# Patient Record
Sex: Male | Born: 1935
Health system: Southern US, Community
[De-identification: ages and names within clinical notes are randomized; demographics above are authoritative.]

## PROBLEM LIST (undated history)

## (undated) DIAGNOSIS — C9110 Chronic lymphocytic leukemia of B-cell type not having achieved remission: Secondary | ICD-10-CM

## (undated) DIAGNOSIS — M1990 Unspecified osteoarthritis, unspecified site: Secondary | ICD-10-CM

## (undated) DIAGNOSIS — B029 Zoster without complications: Secondary | ICD-10-CM

## (undated) DIAGNOSIS — I2510 Atherosclerotic heart disease of native coronary artery without angina pectoris: Secondary | ICD-10-CM

## (undated) DIAGNOSIS — E559 Vitamin D deficiency, unspecified: Secondary | ICD-10-CM

## (undated) DIAGNOSIS — E785 Hyperlipidemia, unspecified: Secondary | ICD-10-CM

## (undated) HISTORY — DX: Unspecified osteoarthritis, unspecified site: M19.90

## (undated) HISTORY — PX: EYE SURGERY: SHX253

## (undated) HISTORY — PX: SURGERY OF LIP: SUR1315

## (undated) HISTORY — DX: Zoster without complications: B02.9

## (undated) HISTORY — DX: Vitamin D deficiency, unspecified: E55.9

## (undated) HISTORY — PX: LAPAROSCOPIC TRANS ANAL AND TRANSABDOMINAL RECTAL RESECTION WITH COLOANAL ANASTOMOSIS: SHX6245

## (undated) HISTORY — PX: VASECTOMY: SHX75

## (undated) HISTORY — DX: Chronic lymphocytic leukemia of B-cell type not having achieved remission: C91.10

## (undated) HISTORY — DX: Hyperlipidemia, unspecified: E78.5

## (undated) HISTORY — PX: TONSILLECTOMY: SUR1361

## (undated) HISTORY — PX: CARDIAC CATHETERIZATION: SHX172

---

## 1998-07-05 ENCOUNTER — Other Ambulatory Visit: Admission: RE | Admit: 1998-07-05 | Discharge: 1998-07-05 | Payer: Self-pay | Admitting: Gastroenterology

## 1999-01-03 ENCOUNTER — Ambulatory Visit (HOSPITAL_COMMUNITY): Admission: RE | Admit: 1999-01-03 | Discharge: 1999-01-03 | Payer: Self-pay | Admitting: General Surgery

## 1999-01-03 ENCOUNTER — Encounter (INDEPENDENT_AMBULATORY_CARE_PROVIDER_SITE_OTHER): Payer: Self-pay | Admitting: *Deleted

## 1999-06-14 ENCOUNTER — Ambulatory Visit (HOSPITAL_COMMUNITY): Admission: RE | Admit: 1999-06-14 | Discharge: 1999-06-14 | Payer: Self-pay

## 2003-12-17 ENCOUNTER — Ambulatory Visit: Payer: Self-pay | Admitting: Oncology

## 2004-03-22 ENCOUNTER — Ambulatory Visit: Payer: Self-pay | Admitting: Oncology

## 2004-06-21 ENCOUNTER — Ambulatory Visit: Payer: Self-pay | Admitting: Oncology

## 2004-10-01 ENCOUNTER — Ambulatory Visit: Payer: Self-pay | Admitting: Oncology

## 2004-12-20 ENCOUNTER — Ambulatory Visit: Payer: Self-pay | Admitting: Oncology

## 2005-03-25 ENCOUNTER — Ambulatory Visit: Payer: Self-pay | Admitting: Oncology

## 2005-07-08 ENCOUNTER — Ambulatory Visit: Payer: Self-pay | Admitting: Oncology

## 2005-07-14 LAB — CBC WITH DIFFERENTIAL/PLATELET
Basophils Absolute: 0 10*3/uL (ref 0.0–0.1)
EOS%: 2.5 % (ref 0.0–7.0)
Eosinophils Absolute: 0.3 10*3/uL (ref 0.0–0.5)
HGB: 15.9 g/dL (ref 13.0–17.1)
NEUT#: 3.6 10*3/uL (ref 1.5–6.5)
RDW: 13.3 % (ref 11.2–14.6)
lymph#: 7.3 10*3/uL — ABNORMAL HIGH (ref 0.9–3.3)

## 2005-07-17 LAB — IMMUNOFIXATION ELECTROPHORESIS
IgG (Immunoglobin G), Serum: 912 mg/dL (ref 694–1618)
IgM, Serum: 22 mg/dL — ABNORMAL LOW (ref 60–263)
Total Protein, Serum Electrophoresis: 5.8 g/dL — ABNORMAL LOW (ref 6.0–8.3)

## 2005-07-17 LAB — COMPREHENSIVE METABOLIC PANEL
AST: 17 U/L (ref 0–37)
Albumin: 3.5 g/dL (ref 3.5–5.2)
BUN: 17 mg/dL (ref 6–23)
Calcium: 8.6 mg/dL (ref 8.4–10.5)
Chloride: 106 mEq/L (ref 96–112)
Glucose, Bld: 110 mg/dL — ABNORMAL HIGH (ref 70–99)
Potassium: 4.4 mEq/L (ref 3.5–5.3)

## 2005-10-09 ENCOUNTER — Ambulatory Visit: Payer: Self-pay | Admitting: Oncology

## 2005-10-13 LAB — CBC WITH DIFFERENTIAL/PLATELET
Basophils Absolute: 0 10*3/uL (ref 0.0–0.1)
EOS%: 1.9 % (ref 0.0–7.0)
Eosinophils Absolute: 0.2 10*3/uL (ref 0.0–0.5)
HGB: 15.3 g/dL (ref 13.0–17.1)
MCH: 30.8 pg (ref 28.0–33.4)
NEUT#: 2.6 10*3/uL (ref 1.5–6.5)
RDW: 14.1 % (ref 11.2–14.6)
lymph#: 7.6 10*3/uL — ABNORMAL HIGH (ref 0.9–3.3)

## 2005-10-14 LAB — COMPREHENSIVE METABOLIC PANEL
AST: 18 U/L (ref 0–37)
Albumin: 3.7 g/dL (ref 3.5–5.2)
BUN: 20 mg/dL (ref 6–23)
Calcium: 8.6 mg/dL (ref 8.4–10.5)
Chloride: 108 mEq/L (ref 96–112)
Potassium: 4.2 mEq/L (ref 3.5–5.3)
Sodium: 139 mEq/L (ref 135–145)
Total Protein: 6 g/dL (ref 6.0–8.3)

## 2005-10-14 LAB — IMMUNOFIXATION ELECTROPHORESIS: IgM, Serum: 19 mg/dL — ABNORMAL LOW (ref 60–263)

## 2006-01-19 ENCOUNTER — Ambulatory Visit: Payer: Self-pay | Admitting: Internal Medicine

## 2006-01-22 LAB — CBC WITH DIFFERENTIAL/PLATELET
BASO%: 0.3 % (ref 0.0–2.0)
Basophils Absolute: 0 10*3/uL (ref 0.0–0.1)
EOS%: 1.2 % (ref 0.0–7.0)
HCT: 47.4 % (ref 38.7–49.9)
HGB: 16.2 g/dL (ref 13.0–17.1)
MCH: 30.8 pg (ref 28.0–33.4)
MCHC: 34.2 g/dL (ref 32.0–35.9)
MCV: 89.9 fL (ref 81.6–98.0)
MONO%: 4.1 % (ref 0.0–13.0)
NEUT%: 25.8 % — ABNORMAL LOW (ref 40.0–75.0)
lymph#: 10.2 10*3/uL — ABNORMAL HIGH (ref 0.9–3.3)

## 2006-01-26 LAB — COMPREHENSIVE METABOLIC PANEL
ALT: 20 U/L (ref 0–53)
AST: 19 U/L (ref 0–37)
Alkaline Phosphatase: 73 U/L (ref 39–117)
BUN: 15 mg/dL (ref 6–23)
Creatinine, Ser: 1.09 mg/dL (ref 0.40–1.50)
Total Bilirubin: 0.5 mg/dL (ref 0.3–1.2)

## 2006-01-26 LAB — IMMUNOFIXATION ELECTROPHORESIS
IgA: 167 mg/dL (ref 68–378)
IgM, Serum: 25 mg/dL — ABNORMAL LOW (ref 60–263)

## 2006-01-26 LAB — BETA 2 MICROGLOBULIN, SERUM: Beta-2 Microglobulin: 2.31 mg/L — ABNORMAL HIGH (ref 1.01–1.73)

## 2006-04-15 ENCOUNTER — Ambulatory Visit: Payer: Self-pay | Admitting: Oncology

## 2006-04-20 LAB — CBC WITH DIFFERENTIAL/PLATELET
BASO%: 0.3 % (ref 0.0–2.0)
Eosinophils Absolute: 0.3 10*3/uL (ref 0.0–0.5)
LYMPH%: 69.5 % — ABNORMAL HIGH (ref 14.0–48.0)
MCHC: 34.7 g/dL (ref 32.0–35.9)
MONO#: 0.8 10*3/uL (ref 0.1–0.9)
NEUT#: 3.5 10*3/uL (ref 1.5–6.5)
Platelets: 184 10*3/uL (ref 145–400)
RBC: 5.39 10*6/uL (ref 4.20–5.71)
RDW: 13.6 % (ref 11.2–14.6)
WBC: 15.5 10*3/uL — ABNORMAL HIGH (ref 4.0–10.0)

## 2006-04-22 LAB — IMMUNOFIXATION ELECTROPHORESIS
IgA: 160 mg/dL (ref 68–378)
IgM, Serum: 27 mg/dL — ABNORMAL LOW (ref 60–263)
Total Protein, Serum Electrophoresis: 6.5 g/dL (ref 6.0–8.3)

## 2006-04-22 LAB — COMPREHENSIVE METABOLIC PANEL
ALT: 23 U/L (ref 0–53)
BUN: 20 mg/dL (ref 6–23)
CO2: 27 mEq/L (ref 19–32)
Creatinine, Ser: 1.17 mg/dL (ref 0.40–1.50)
Total Bilirubin: 0.6 mg/dL (ref 0.3–1.2)

## 2006-07-23 ENCOUNTER — Ambulatory Visit: Payer: Self-pay | Admitting: Oncology

## 2006-07-27 LAB — CBC WITH DIFFERENTIAL/PLATELET
BASO%: 0.3 % (ref 0.0–2.0)
EOS%: 1.9 % (ref 0.0–7.0)
LYMPH%: 62.2 % — ABNORMAL HIGH (ref 14.0–48.0)
MCH: 31 pg (ref 28.0–33.4)
MCHC: 34.2 g/dL (ref 32.0–35.9)
MCV: 90.4 fL (ref 81.6–98.0)
MONO%: 4.4 % (ref 0.0–13.0)
NEUT#: 4.7 10*3/uL (ref 1.5–6.5)
Platelets: 159 10*3/uL (ref 145–400)
RBC: 5.05 10*6/uL (ref 4.20–5.71)
RDW: 13.9 % (ref 11.2–14.6)

## 2006-07-28 LAB — COMPREHENSIVE METABOLIC PANEL
AST: 19 U/L (ref 0–37)
Albumin: 3.6 g/dL (ref 3.5–5.2)
Alkaline Phosphatase: 67 U/L (ref 39–117)
Potassium: 4.8 mEq/L (ref 3.5–5.3)
Sodium: 142 mEq/L (ref 135–145)
Total Bilirubin: 0.6 mg/dL (ref 0.3–1.2)
Total Protein: 6 g/dL (ref 6.0–8.3)

## 2006-07-28 LAB — BETA 2 MICROGLOBULIN, SERUM: Beta-2 Microglobulin: 2.29 mg/L — ABNORMAL HIGH (ref 1.01–1.73)

## 2006-10-22 ENCOUNTER — Ambulatory Visit: Payer: Self-pay | Admitting: Oncology

## 2006-10-26 LAB — COMPREHENSIVE METABOLIC PANEL
Albumin: 3.7 g/dL (ref 3.5–5.2)
Alkaline Phosphatase: 69 U/L (ref 39–117)
BUN: 17 mg/dL (ref 6–23)
Calcium: 8.8 mg/dL (ref 8.4–10.5)
Chloride: 108 mEq/L (ref 96–112)
Glucose, Bld: 115 mg/dL — ABNORMAL HIGH (ref 70–99)
Potassium: 4.4 mEq/L (ref 3.5–5.3)

## 2006-10-26 LAB — CBC WITH DIFFERENTIAL/PLATELET
Basophils Absolute: 0.3 10*3/uL — ABNORMAL HIGH (ref 0.0–0.1)
EOS%: 2.1 % (ref 0.0–7.0)
HCT: 44 % (ref 38.7–49.9)
HGB: 15.8 g/dL (ref 13.0–17.1)
MCH: 31.6 pg (ref 28.0–33.4)
NEUT%: 18.9 % — ABNORMAL LOW (ref 40.0–75.0)
lymph#: 12.4 10*3/uL — ABNORMAL HIGH (ref 0.9–3.3)

## 2006-10-26 LAB — IGG, IGA, IGM
IgA: 150 mg/dL (ref 68–378)
IgG (Immunoglobin G), Serum: 962 mg/dL (ref 694–1618)

## 2007-01-21 ENCOUNTER — Ambulatory Visit: Payer: Self-pay | Admitting: Oncology

## 2007-01-25 LAB — CBC WITH DIFFERENTIAL/PLATELET
Eosinophils Absolute: 0.4 10*3/uL (ref 0.0–0.5)
LYMPH%: 76.1 % — ABNORMAL HIGH (ref 14.0–48.0)
MCV: 89.9 fL (ref 81.6–98.0)
MONO%: 4.3 % (ref 0.0–13.0)
NEUT#: 2.8 10*3/uL (ref 1.5–6.5)
Platelets: 157 10*3/uL (ref 145–400)
RBC: 5.29 10*6/uL (ref 4.20–5.71)

## 2007-01-25 LAB — MORPHOLOGY: RBC Comments: NORMAL

## 2007-01-26 LAB — COMPREHENSIVE METABOLIC PANEL
Albumin: 3.6 g/dL (ref 3.5–5.2)
CO2: 23 mEq/L (ref 19–32)
Calcium: 8.8 mg/dL (ref 8.4–10.5)
Chloride: 106 mEq/L (ref 96–112)
Glucose, Bld: 109 mg/dL — ABNORMAL HIGH (ref 70–99)
Potassium: 4.4 mEq/L (ref 3.5–5.3)
Sodium: 140 mEq/L (ref 135–145)
Total Protein: 6.1 g/dL (ref 6.0–8.3)

## 2007-01-26 LAB — BETA 2 MICROGLOBULIN, SERUM: Beta-2 Microglobulin: 2.93 mg/L — ABNORMAL HIGH (ref 1.01–1.73)

## 2007-04-15 ENCOUNTER — Ambulatory Visit: Payer: Self-pay | Admitting: Oncology

## 2007-04-20 LAB — CBC WITH DIFFERENTIAL/PLATELET
BASO%: 0.5 % (ref 0.0–2.0)
EOS%: 1.3 % (ref 0.0–7.0)
HCT: 46.6 % (ref 38.7–49.9)
LYMPH%: 75.9 % — ABNORMAL HIGH (ref 14.0–48.0)
MCH: 30.8 pg (ref 28.0–33.4)
MCHC: 34.7 g/dL (ref 32.0–35.9)
MONO%: 5.1 % (ref 0.0–13.0)
NEUT%: 17.2 % — ABNORMAL LOW (ref 40.0–75.0)
lymph#: 13.4 10*3/uL — ABNORMAL HIGH (ref 0.9–3.3)

## 2007-04-20 LAB — COMPREHENSIVE METABOLIC PANEL
ALT: 23 U/L (ref 0–53)
AST: 23 U/L (ref 0–37)
Alkaline Phosphatase: 73 U/L (ref 39–117)
Creatinine, Ser: 1.11 mg/dL (ref 0.40–1.50)
Total Bilirubin: 0.5 mg/dL (ref 0.3–1.2)

## 2007-04-20 LAB — IGG, IGA, IGM
IgA: 149 mg/dL (ref 68–378)
IgM, Serum: 19 mg/dL — ABNORMAL LOW (ref 60–263)

## 2007-08-04 ENCOUNTER — Ambulatory Visit: Payer: Self-pay | Admitting: Oncology

## 2007-08-09 LAB — MORPHOLOGY: PLT EST: ADEQUATE

## 2007-08-09 LAB — CBC WITH DIFFERENTIAL/PLATELET
BASO%: 0.2 % (ref 0.0–2.0)
Basophils Absolute: 0 10*3/uL (ref 0.0–0.1)
Eosinophils Absolute: 0.3 10*3/uL (ref 0.0–0.5)
HCT: 46.9 % (ref 38.7–49.9)
HGB: 16 g/dL (ref 13.0–17.1)
MONO#: 0.8 10*3/uL (ref 0.1–0.9)
NEUT%: 15 % — ABNORMAL LOW (ref 40.0–75.0)
Platelets: 155 10*3/uL (ref 145–400)
WBC: 20.4 10*3/uL — ABNORMAL HIGH (ref 4.0–10.0)
lymph#: 16.1 10*3/uL — ABNORMAL HIGH (ref 0.9–3.3)

## 2007-08-09 LAB — COMPREHENSIVE METABOLIC PANEL
AST: 19 U/L (ref 0–37)
Alkaline Phosphatase: 65 U/L (ref 39–117)
BUN: 19 mg/dL (ref 6–23)
Calcium: 9.1 mg/dL (ref 8.4–10.5)
Chloride: 106 mEq/L (ref 96–112)
Creatinine, Ser: 1.09 mg/dL (ref 0.40–1.50)

## 2007-08-11 LAB — IMMUNOFIXATION ELECTROPHORESIS
IgA: 146 mg/dL (ref 68–378)
IgG (Immunoglobin G), Serum: 997 mg/dL (ref 694–1618)

## 2007-08-11 LAB — BETA 2 MICROGLOBULIN, SERUM: Beta-2 Microglobulin: 2.42 mg/L — ABNORMAL HIGH (ref 1.01–1.73)

## 2007-10-21 ENCOUNTER — Ambulatory Visit: Payer: Self-pay | Admitting: Oncology

## 2007-10-25 LAB — CBC WITH DIFFERENTIAL/PLATELET
BASO%: 0.2 % (ref 0.0–2.0)
Basophils Absolute: 0 10*3/uL (ref 0.0–0.1)
EOS%: 1.9 % (ref 0.0–7.0)
HCT: 46.2 % (ref 38.7–49.9)
HGB: 15.7 g/dL (ref 13.0–17.1)
LYMPH%: 79.7 % — ABNORMAL HIGH (ref 14.0–48.0)
MCH: 31 pg (ref 28.0–33.4)
MCHC: 33.9 g/dL (ref 32.0–35.9)
MCV: 91.6 fL (ref 81.6–98.0)
MONO%: 2.9 % (ref 0.0–13.0)
NEUT%: 15.3 % — ABNORMAL LOW (ref 40.0–75.0)
Platelets: 151 10*3/uL (ref 145–400)
lymph#: 16.2 10*3/uL — ABNORMAL HIGH (ref 0.9–3.3)

## 2007-10-26 LAB — COMPREHENSIVE METABOLIC PANEL
ALT: 16 U/L (ref 0–53)
Albumin: 3.7 g/dL (ref 3.5–5.2)
Alkaline Phosphatase: 67 U/L (ref 39–117)
CO2: 25 mEq/L (ref 19–32)
Potassium: 4.7 mEq/L (ref 3.5–5.3)
Sodium: 139 mEq/L (ref 135–145)
Total Bilirubin: 0.5 mg/dL (ref 0.3–1.2)
Total Protein: 5.9 g/dL — ABNORMAL LOW (ref 6.0–8.3)

## 2007-10-26 LAB — BETA 2 MICROGLOBULIN, SERUM: Beta-2 Microglobulin: 2.52 mg/L — ABNORMAL HIGH (ref 1.01–1.73)

## 2008-01-12 ENCOUNTER — Ambulatory Visit: Payer: Self-pay | Admitting: Oncology

## 2008-01-17 LAB — CBC WITH DIFFERENTIAL/PLATELET
BASO%: 0.2 % (ref 0.0–2.0)
EOS%: 2.1 % (ref 0.0–7.0)
MCH: 30.6 pg (ref 28.0–33.4)
MCHC: 33.6 g/dL (ref 32.0–35.9)
MONO#: 1 10*3/uL — ABNORMAL HIGH (ref 0.1–0.9)
NEUT%: 20.1 % — ABNORMAL LOW (ref 40.0–75.0)
RBC: 5.2 10*6/uL (ref 4.20–5.71)
RDW: 13.2 % (ref 11.2–14.6)
WBC: 23.7 10*3/uL — ABNORMAL HIGH (ref 4.0–10.0)
lymph#: 17.4 10*3/uL — ABNORMAL HIGH (ref 0.9–3.3)

## 2008-01-19 LAB — COMPREHENSIVE METABOLIC PANEL
AST: 18 U/L (ref 0–37)
Albumin: 3.6 g/dL (ref 3.5–5.2)
BUN: 22 mg/dL (ref 6–23)
Calcium: 8.5 mg/dL (ref 8.4–10.5)
Chloride: 107 mEq/L (ref 96–112)
Creatinine, Ser: 1.16 mg/dL (ref 0.40–1.50)
Glucose, Bld: 108 mg/dL — ABNORMAL HIGH (ref 70–99)

## 2008-01-19 LAB — IMMUNOFIXATION ELECTROPHORESIS
IgA: 149 mg/dL (ref 68–378)
IgG (Immunoglobin G), Serum: 923 mg/dL (ref 694–1618)
Total Protein, Serum Electrophoresis: 5.8 g/dL — ABNORMAL LOW (ref 6.0–8.3)

## 2008-01-19 LAB — BETA 2 MICROGLOBULIN, SERUM: Beta-2 Microglobulin: 2.57 mg/L — ABNORMAL HIGH (ref 1.01–1.73)

## 2008-03-10 ENCOUNTER — Ambulatory Visit (HOSPITAL_BASED_OUTPATIENT_CLINIC_OR_DEPARTMENT_OTHER): Admission: RE | Admit: 2008-03-10 | Discharge: 2008-03-10 | Payer: Self-pay | Admitting: Otolaryngology

## 2008-03-10 ENCOUNTER — Encounter (INDEPENDENT_AMBULATORY_CARE_PROVIDER_SITE_OTHER): Payer: Self-pay | Admitting: Otolaryngology

## 2008-04-20 ENCOUNTER — Ambulatory Visit: Payer: Self-pay | Admitting: Oncology

## 2008-04-24 LAB — CBC WITH DIFFERENTIAL/PLATELET
BASO%: 0.3 % (ref 0.0–2.0)
EOS%: 1.2 % (ref 0.0–7.0)
HCT: 46.9 % (ref 38.4–49.9)
MCH: 30.5 pg (ref 27.2–33.4)
MCHC: 33.9 g/dL (ref 32.0–36.0)
MCV: 89.9 fL (ref 79.3–98.0)
MONO%: 1.5 % (ref 0.0–14.0)
NEUT%: 14.1 % — ABNORMAL LOW (ref 39.0–75.0)
RDW: 13.6 % (ref 11.0–14.6)
lymph#: 21.2 10*3/uL — ABNORMAL HIGH (ref 0.9–3.3)

## 2008-04-26 LAB — COMPREHENSIVE METABOLIC PANEL
ALT: 16 U/L (ref 0–53)
AST: 18 U/L (ref 0–37)
Alkaline Phosphatase: 62 U/L (ref 39–117)
BUN: 17 mg/dL (ref 6–23)
Calcium: 8.9 mg/dL (ref 8.4–10.5)
Chloride: 106 mEq/L (ref 96–112)
Creatinine, Ser: 1.07 mg/dL (ref 0.40–1.50)
Total Bilirubin: 0.6 mg/dL (ref 0.3–1.2)

## 2008-04-26 LAB — BETA 2 MICROGLOBULIN, SERUM: Beta-2 Microglobulin: 2.58 mg/L — ABNORMAL HIGH (ref 1.01–1.73)

## 2008-04-26 LAB — IMMUNOFIXATION ELECTROPHORESIS
IgA: 144 mg/dL (ref 68–378)
IgG (Immunoglobin G), Serum: 919 mg/dL (ref 694–1618)
IgM, Serum: 17 mg/dL — ABNORMAL LOW (ref 60–263)

## 2008-07-27 ENCOUNTER — Ambulatory Visit: Payer: Self-pay | Admitting: Oncology

## 2008-07-31 LAB — CBC WITH DIFFERENTIAL/PLATELET
BASO%: 0.3 % (ref 0.0–2.0)
EOS%: 1.5 % (ref 0.0–7.0)
HCT: 46.6 % (ref 38.4–49.9)
LYMPH%: 79 % — ABNORMAL HIGH (ref 14.0–49.0)
MCH: 31 pg (ref 27.2–33.4)
MCHC: 34 g/dL (ref 32.0–36.0)
MCV: 91.2 fL (ref 79.3–98.0)
MONO%: 3.5 % (ref 0.0–14.0)
NEUT%: 15.7 % — ABNORMAL LOW (ref 39.0–75.0)
Platelets: 146 10*3/uL (ref 140–400)
lymph#: 21.3 10*3/uL — ABNORMAL HIGH (ref 0.9–3.3)

## 2008-10-27 ENCOUNTER — Ambulatory Visit: Payer: Self-pay | Admitting: Oncology

## 2008-10-31 LAB — CBC WITH DIFFERENTIAL/PLATELET
EOS%: 0.9 % (ref 0.0–7.0)
Eosinophils Absolute: 0.2 10*3/uL (ref 0.0–0.5)
LYMPH%: 83.1 % — ABNORMAL HIGH (ref 14.0–49.0)
MCH: 30.8 pg (ref 27.2–33.4)
MCHC: 33.9 g/dL (ref 32.0–36.0)
MCV: 90.9 fL (ref 79.3–98.0)
MONO%: 3.5 % (ref 0.0–14.0)
NEUT#: 3.1 10*3/uL (ref 1.5–6.5)
Platelets: 158 10*3/uL (ref 140–400)
RBC: 5.14 10*6/uL (ref 4.20–5.82)
RDW: 13.2 % (ref 11.0–14.6)

## 2008-10-31 LAB — CHCC SMEAR

## 2008-11-01 LAB — COMPREHENSIVE METABOLIC PANEL WITH GFR
ALT: 16 U/L (ref 0–53)
AST: 17 U/L (ref 0–37)
Albumin: 3.7 g/dL (ref 3.5–5.2)
Alkaline Phosphatase: 58 U/L (ref 39–117)
BUN: 19 mg/dL (ref 6–23)
CO2: 24 meq/L (ref 19–32)
Calcium: 8.7 mg/dL (ref 8.4–10.5)
Chloride: 106 meq/L (ref 96–112)
Creatinine, Ser: 1.12 mg/dL (ref 0.40–1.50)
Glucose, Bld: 107 mg/dL — ABNORMAL HIGH (ref 70–99)
Potassium: 4.3 meq/L (ref 3.5–5.3)
Sodium: 139 meq/L (ref 135–145)
Total Bilirubin: 0.6 mg/dL (ref 0.3–1.2)
Total Protein: 6.1 g/dL (ref 6.0–8.3)

## 2008-11-01 LAB — LACTATE DEHYDROGENASE: LDH: 150 U/L (ref 94–250)

## 2008-11-01 LAB — URIC ACID: Uric Acid, Serum: 7.6 mg/dL (ref 4.0–7.8)

## 2008-11-01 LAB — BETA 2 MICROGLOBULIN, SERUM: Beta-2 Microglobulin: 2.45 mg/L — ABNORMAL HIGH (ref 1.01–1.73)

## 2008-11-01 LAB — IGG, IGA, IGM: IgG (Immunoglobin G), Serum: 822 mg/dL (ref 694–1618)

## 2009-02-23 ENCOUNTER — Ambulatory Visit: Payer: Self-pay | Admitting: Oncology

## 2009-02-27 LAB — CBC WITH DIFFERENTIAL/PLATELET
Basophils Absolute: 0 10*3/uL (ref 0.0–0.1)
Eosinophils Absolute: 0.3 10*3/uL (ref 0.0–0.5)
HCT: 47.5 % (ref 38.4–49.9)
HGB: 16.1 g/dL (ref 13.0–17.1)
LYMPH%: 82.1 % — ABNORMAL HIGH (ref 14.0–49.0)
MCV: 91.4 fL (ref 79.3–98.0)
MONO#: 1 10*3/uL — ABNORMAL HIGH (ref 0.1–0.9)
MONO%: 3.5 % (ref 0.0–14.0)
NEUT#: 4 10*3/uL (ref 1.5–6.5)
Platelets: 169 10*3/uL (ref 140–400)
WBC: 29.5 10*3/uL — ABNORMAL HIGH (ref 4.0–10.3)

## 2009-02-28 LAB — KAPPA/LAMBDA LIGHT CHAINS
Kappa free light chain: 0.88 mg/dL (ref 0.33–1.94)
Kappa:Lambda Ratio: 1 (ref 0.26–1.65)

## 2009-02-28 LAB — COMPREHENSIVE METABOLIC PANEL
ALT: 16 U/L (ref 0–53)
AST: 18 U/L (ref 0–37)
Alkaline Phosphatase: 66 U/L (ref 39–117)
BUN: 21 mg/dL (ref 6–23)
Creatinine, Ser: 1.05 mg/dL (ref 0.40–1.50)
Potassium: 4.4 mEq/L (ref 3.5–5.3)

## 2009-02-28 LAB — IGG, IGA, IGM: IgM, Serum: 19 mg/dL — ABNORMAL LOW (ref 60–263)

## 2009-07-03 ENCOUNTER — Ambulatory Visit: Payer: Self-pay | Admitting: Oncology

## 2009-07-03 LAB — COMPREHENSIVE METABOLIC PANEL
ALT: 16 U/L (ref 0–53)
AST: 20 U/L (ref 0–37)
Albumin: 3.7 g/dL (ref 3.5–5.2)
Alkaline Phosphatase: 63 U/L (ref 39–117)
Glucose, Bld: 110 mg/dL — ABNORMAL HIGH (ref 70–99)
Potassium: 4.3 mEq/L (ref 3.5–5.3)
Sodium: 138 mEq/L (ref 135–145)
Total Bilirubin: 0.6 mg/dL (ref 0.3–1.2)
Total Protein: 6.4 g/dL (ref 6.0–8.3)

## 2009-07-03 LAB — CBC WITH DIFFERENTIAL/PLATELET
BASO%: 0.2 % (ref 0.0–2.0)
EOS%: 0.9 % (ref 0.0–7.0)
Eosinophils Absolute: 0.3 10*3/uL (ref 0.0–0.5)
MCHC: 34.3 g/dL (ref 32.0–36.0)
MCV: 90.5 fL (ref 79.3–98.0)
MONO%: 4.4 % (ref 0.0–14.0)
NEUT#: 4.1 10*3/uL (ref 1.5–6.5)
RBC: 5.12 10*6/uL (ref 4.20–5.82)
RDW: 13.4 % (ref 11.0–14.6)
WBC: 31.3 10*3/uL — ABNORMAL HIGH (ref 4.0–10.3)

## 2009-07-26 ENCOUNTER — Ambulatory Visit (HOSPITAL_COMMUNITY): Admission: RE | Admit: 2009-07-26 | Discharge: 2009-07-26 | Payer: Self-pay | Admitting: Gastroenterology

## 2009-10-29 ENCOUNTER — Ambulatory Visit: Payer: Self-pay | Admitting: Oncology

## 2009-10-30 LAB — CBC WITH DIFFERENTIAL/PLATELET
BASO%: 0.2 % (ref 0.0–2.0)
EOS%: 0.8 % (ref 0.0–7.0)
HCT: 46.1 % (ref 38.4–49.9)
LYMPH%: 86.7 % — ABNORMAL HIGH (ref 14.0–49.0)
MCH: 31.3 pg (ref 27.2–33.4)
MCHC: 34.5 g/dL (ref 32.0–36.0)
MCV: 90.9 fL (ref 79.3–98.0)
NEUT%: 10 % — ABNORMAL LOW (ref 39.0–75.0)
Platelets: 154 10*3/uL (ref 140–400)

## 2009-10-31 LAB — KAPPA/LAMBDA LIGHT CHAINS
Kappa free light chain: 0.96 mg/dL (ref 0.33–1.94)
Lambda Free Lght Chn: 1.16 mg/dL (ref 0.57–2.63)

## 2009-10-31 LAB — COMPREHENSIVE METABOLIC PANEL
ALT: 16 U/L (ref 0–53)
AST: 20 U/L (ref 0–37)
Creatinine, Ser: 1.16 mg/dL (ref 0.40–1.50)
Total Bilirubin: 0.5 mg/dL (ref 0.3–1.2)

## 2009-10-31 LAB — IGG, IGA, IGM
IgG (Immunoglobin G), Serum: 1020 mg/dL (ref 694–1618)
IgM, Serum: 14 mg/dL — ABNORMAL LOW (ref 60–263)

## 2010-02-04 ENCOUNTER — Other Ambulatory Visit: Payer: Self-pay | Admitting: Dermatology

## 2010-05-08 ENCOUNTER — Other Ambulatory Visit: Payer: Self-pay | Admitting: Oncology

## 2010-05-08 ENCOUNTER — Encounter (HOSPITAL_BASED_OUTPATIENT_CLINIC_OR_DEPARTMENT_OTHER): Payer: Medicare Other | Admitting: Oncology

## 2010-05-08 DIAGNOSIS — C8599 Non-Hodgkin lymphoma, unspecified, extranodal and solid organ sites: Secondary | ICD-10-CM

## 2010-05-08 DIAGNOSIS — C9110 Chronic lymphocytic leukemia of B-cell type not having achieved remission: Secondary | ICD-10-CM

## 2010-05-08 LAB — CBC WITH DIFFERENTIAL/PLATELET
BASO%: 0.3 % (ref 0.0–2.0)
MCHC: 33.1 g/dL (ref 32.0–36.0)
MONO#: 1 10*3/uL — ABNORMAL HIGH (ref 0.1–0.9)
RBC: 5.09 10*6/uL (ref 4.20–5.82)
RDW: 13.6 % (ref 11.0–14.6)
WBC: 24.5 10*3/uL — ABNORMAL HIGH (ref 4.0–10.3)
lymph#: 21 10*3/uL — ABNORMAL HIGH (ref 0.9–3.3)

## 2010-05-08 LAB — MORPHOLOGY: RBC Comments: NORMAL

## 2010-05-08 LAB — COMPREHENSIVE METABOLIC PANEL
AST: 21 U/L (ref 0–37)
BUN: 19 mg/dL (ref 6–23)
Calcium: 8.4 mg/dL (ref 8.4–10.5)
Chloride: 106 mEq/L (ref 96–112)
Creatinine, Ser: 1.02 mg/dL (ref 0.40–1.50)
Total Bilirubin: 0.5 mg/dL (ref 0.3–1.2)

## 2010-05-08 LAB — CHCC SMEAR

## 2010-05-28 NOTE — Op Note (Signed)
NAME:  Francisco Larson, Francisco Larson NO.:  [id number]   MEDICAL RECORD NO.:  [id number]          PATIENT TYPE:  AMB   LOCATION:  DSC                          FACILITY:  [facility]   PHYSICIAN:  Christopher E. 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   OPERATION PERFORMED:  Wide excision of right lower lip squamous cell  carcinoma with local rhomboid mucosal flap closure.   SURGEON:  Kristine Garbe. Ezzard Standing, MD   ANESTHESIA:  Local Xylocaine with epinephrine.   COMPLICATIONS:  None.   BRIEF CLINICAL NOTE:  Francisco Larson is a 75 year old gentleman, who  has had a nodule of the right lower lip for several years, but over the  last several months, this enlarged a little bit, and he was were seen by  Dr. Egbert Garibaldi, who performed an excision of right lower lip approximately 1-  cm nodule about 2 weeks ago.  Final pathology report revealed moderate  differentiated squamous cell carcinoma with positive margin.  The  patient has a previous history of chronic lymphocytic leukemia followed  by Dr. Darnelle Catalan.  He is taken back to the operating room at this time  for wide local excision of right lower lip well-differentiated squamous  cell carcinoma.   DESCRIPTION OF PROCEDURE:  After the patient was brought to the  operating room, the right lower lip was injected with Xylocaine with  epinephrine for local anesthetic where he had previous excisional biopsy  performed 2 weeks ago.  This area was fairly indurated and nodule  measuring approximately a centimeter in size.  The previous excision  site was marked out and then an additional 6-8 mm of normal-appearing  mucosa was marked out around this, and  the area was circumferentially  excised with a gross normal 6-8 mm of mucosa around the edges.  Excision  was carried down through the mucosa, subcutaneous tissue, and  orbicularis muscle down to the dermis of the skin.  Cautery was used for  hemostasis.  A single stitch was placed at the 12 o'clock position to  mark the superior aspect of the excision.  Specimen was sent to  pathology.  Following excision, the patient had approximate 2.5 to 3 cm  deficit.  In order to close this, a rhomboid-type mucosal flap was  elevated from the buccal mucosa more posteriorly based inferiorly and  rotated into the excisional defect.  This was closed with interrupted 4-  0 chromic sutures.  This completed the procedure.  Francisco Larson tolerated the  procedure well and was subsequently discharged home on Tylenol  and Tylenol No. 3 p.r.n. pain, Keflex 500 mg b.i.d. for 1 week,  instructed on a liquid diet for the next 2 days and then advance to a  regular diet as tolerated.  We will have him follow up in my office in 5  days to review final pathology report and recheck wound.           ______________________________  Kristine Garbe Ezzard Standing, M.D.     CEN/MEDQ  D:  03/10/2008  T:  03/10/2008  Job:  [id number]   cc:   Lorin Picket R. Egbert Garibaldi, D.D.S.  Valentino Hue. Magrinat, M.D.

## 2010-10-31 ENCOUNTER — Encounter (HOSPITAL_BASED_OUTPATIENT_CLINIC_OR_DEPARTMENT_OTHER): Payer: Medicare Other | Admitting: Oncology

## 2010-10-31 ENCOUNTER — Other Ambulatory Visit: Payer: Self-pay | Admitting: Oncology

## 2010-10-31 DIAGNOSIS — C8599 Non-Hodgkin lymphoma, unspecified, extranodal and solid organ sites: Secondary | ICD-10-CM

## 2010-10-31 DIAGNOSIS — C9110 Chronic lymphocytic leukemia of B-cell type not having achieved remission: Secondary | ICD-10-CM

## 2010-10-31 LAB — CBC WITH DIFFERENTIAL/PLATELET
BASO%: 0.2 % (ref 0.0–2.0)
Basophils Absolute: 0.1 10*3/uL (ref 0.0–0.1)
Eosinophils Absolute: 0.4 10*3/uL (ref 0.0–0.5)
HCT: 45.2 % (ref 38.4–49.9)
HGB: 15.4 g/dL (ref 13.0–17.1)
MONO#: 0.4 10*3/uL (ref 0.1–0.9)
NEUT#: 3.3 10*3/uL (ref 1.5–6.5)
NEUT%: 10.2 % — ABNORMAL LOW (ref 39.0–75.0)
WBC: 32.3 10*3/uL — ABNORMAL HIGH (ref 4.0–10.3)
lymph#: 28.1 10*3/uL — ABNORMAL HIGH (ref 0.9–3.3)

## 2010-11-01 LAB — COMPREHENSIVE METABOLIC PANEL
ALT: 16 U/L (ref 0–53)
CO2: 24 mEq/L (ref 19–32)
Calcium: 8.8 mg/dL (ref 8.4–10.5)
Chloride: 107 mEq/L (ref 96–112)
Creatinine, Ser: 1.13 mg/dL (ref 0.50–1.35)

## 2010-11-01 LAB — IGG, IGA, IGM: IgG (Immunoglobin G), Serum: 846 mg/dL (ref 650–1600)

## 2010-11-07 ENCOUNTER — Encounter (HOSPITAL_BASED_OUTPATIENT_CLINIC_OR_DEPARTMENT_OTHER): Payer: Medicare Other | Admitting: Oncology

## 2010-11-07 DIAGNOSIS — Z23 Encounter for immunization: Secondary | ICD-10-CM

## 2010-11-07 DIAGNOSIS — G5790 Unspecified mononeuropathy of unspecified lower limb: Secondary | ICD-10-CM

## 2010-11-07 DIAGNOSIS — C9110 Chronic lymphocytic leukemia of B-cell type not having achieved remission: Secondary | ICD-10-CM

## 2011-01-21 ENCOUNTER — Ambulatory Visit (INDEPENDENT_AMBULATORY_CARE_PROVIDER_SITE_OTHER): Payer: Medicare Other | Admitting: Emergency Medicine

## 2011-01-21 DIAGNOSIS — R7989 Other specified abnormal findings of blood chemistry: Secondary | ICD-10-CM

## 2011-01-21 DIAGNOSIS — E559 Vitamin D deficiency, unspecified: Secondary | ICD-10-CM

## 2011-01-21 DIAGNOSIS — Z79899 Other long term (current) drug therapy: Secondary | ICD-10-CM

## 2011-01-21 DIAGNOSIS — E785 Hyperlipidemia, unspecified: Secondary | ICD-10-CM

## 2011-01-27 ENCOUNTER — Other Ambulatory Visit: Payer: Self-pay | Admitting: Oncology

## 2011-01-27 DIAGNOSIS — C9110 Chronic lymphocytic leukemia of B-cell type not having achieved remission: Secondary | ICD-10-CM

## 2011-05-08 ENCOUNTER — Other Ambulatory Visit (HOSPITAL_BASED_OUTPATIENT_CLINIC_OR_DEPARTMENT_OTHER): Payer: Medicare Other | Admitting: Lab

## 2011-05-08 DIAGNOSIS — C9110 Chronic lymphocytic leukemia of B-cell type not having achieved remission: Secondary | ICD-10-CM

## 2011-05-08 LAB — CBC WITH DIFFERENTIAL/PLATELET
BASO%: 0.2 % (ref 0.0–2.0)
Basophils Absolute: 0.1 10*3/uL (ref 0.0–0.1)
Eosinophils Absolute: 0.3 10*3/uL (ref 0.0–0.5)
HCT: 47 % (ref 38.4–49.9)
HGB: 15.7 g/dL (ref 13.0–17.1)
LYMPH%: 88.5 % — ABNORMAL HIGH (ref 14.0–49.0)
MCHC: 33.4 g/dL (ref 32.0–36.0)
MONO#: 0.7 10*3/uL (ref 0.1–0.9)
NEUT#: 2.7 10*3/uL (ref 1.5–6.5)
NEUT%: 8.3 % — ABNORMAL LOW (ref 39.0–75.0)
Platelets: 147 10*3/uL (ref 140–400)
WBC: 32.9 10*3/uL — ABNORMAL HIGH (ref 4.0–10.3)
lymph#: 29.1 10*3/uL — ABNORMAL HIGH (ref 0.9–3.3)

## 2011-05-08 LAB — MORPHOLOGY: PLT EST: ADEQUATE

## 2011-05-08 LAB — CHCC SMEAR

## 2011-05-09 LAB — COMPREHENSIVE METABOLIC PANEL
ALT: 17 U/L (ref 0–53)
AST: 20 U/L (ref 0–37)
Alkaline Phosphatase: 55 U/L (ref 39–117)
BUN: 21 mg/dL (ref 6–23)
Creatinine, Ser: 1.04 mg/dL (ref 0.50–1.35)
Potassium: 4.4 mEq/L (ref 3.5–5.3)

## 2011-05-09 LAB — IGG, IGA, IGM
IgA: 130 mg/dL (ref 68–379)
IgM, Serum: 15 mg/dL — ABNORMAL LOW (ref 41–251)

## 2011-05-09 LAB — BETA 2 MICROGLOBULIN, SERUM: Beta-2 Microglobulin: 2.64 mg/L — ABNORMAL HIGH (ref 1.01–1.73)

## 2011-05-13 ENCOUNTER — Other Ambulatory Visit: Payer: Self-pay | Admitting: Oncology

## 2011-07-17 ENCOUNTER — Ambulatory Visit: Payer: Medicare Other

## 2011-07-17 ENCOUNTER — Ambulatory Visit (INDEPENDENT_AMBULATORY_CARE_PROVIDER_SITE_OTHER): Payer: Medicare Other | Admitting: Emergency Medicine

## 2011-07-17 VITALS — BP 114/68 | HR 79 | Temp 98.2°F | Resp 16 | Ht 71.5 in | Wt 209.8 lb

## 2011-07-17 DIAGNOSIS — C9110 Chronic lymphocytic leukemia of B-cell type not having achieved remission: Secondary | ICD-10-CM

## 2011-07-17 DIAGNOSIS — R059 Cough, unspecified: Secondary | ICD-10-CM

## 2011-07-17 DIAGNOSIS — R05 Cough: Secondary | ICD-10-CM

## 2011-07-17 LAB — POCT CBC
HCT, POC: 48.5 % (ref 43.5–53.7)
Lymph, poc: 32.9 — AB (ref 0.6–3.4)
MCH, POC: 30.6 pg (ref 27–31.2)
MCHC: 32.4 g/dL (ref 31.8–35.4)
MCV: 94.6 fL (ref 80–97)
MID (cbc): 2 — AB (ref 0–0.9)
POC LYMPH PERCENT: 82.4 %L — AB (ref 10–50)
RDW, POC: 14.3 %

## 2011-07-17 MED ORDER — AMOXICILLIN 875 MG PO TABS: 875.0000 mg | ORAL_TABLET | Freq: Two times a day (BID) | ORAL | Status: AC

## 2011-07-17 MED ORDER — BENZONATATE 200 MG PO CAPS: 200.0000 mg | ORAL_CAPSULE | Freq: Two times a day (BID) | ORAL | Status: AC | PRN

## 2011-07-17 NOTE — Progress Notes (Signed)
  Subjective:    Patient ID: Francisco Larson, male    DOB: 26-Jun-1935, 76 y.o.   MRN: [id number]  HPI Upper respiratory for 2 weeks. Runny nose, sneezing, chest congestion, productive cough, weakness. Took mucinex for 10 days, stopped and then started again on Tuesday, July 2.  WBC 32,000 a month and a half ago.    Review of Systems     Objective:   Physical Exam  Constitutional: He appears well-developed and well-nourished.  HENT:  Head: Normocephalic.  Eyes: Pupils are equal, round, and reactive to light.  Neck: No tracheal deviation present. No thyromegaly present.  Cardiovascular: Normal rate and regular rhythm.   Pulmonary/Chest: Effort normal and breath sounds normal. No respiratory distress. He has no wheezes. He has no rales. He exhibits no tenderness.  Abdominal: Soft.   Results for orders placed in visit on 07/17/11  POCT CBC      Component Value Range   WBC 36.9 (*) 4.6 - 10.2 K/uL   Lymph, poc 32.9 (*) 0.6 - 3.4   POC LYMPH PERCENT 82.4 (*) 10 - 50 %L   MID (cbc) 2.0 (*) 0 - 0.9   POC MID % 5.1  0 - 12 %M   POC Granulocyte 5.0  2 - 6.9   Granulocyte percent 12.5 (*) 37 - 80 %G   RBC 5.13  4.69 - 6.13 M/uL   Hemoglobin 15.7  14.1 - 18.1 g/dL   HCT, POC 40.9  81.1 - 53.7 %   MCV 94.6  80 - 97 fL   MCH, POC 30.6  27 - 31.2 pg   MCHC 32.4  31.8 - 35.4 g/dL   RDW, POC 91.4     Platelet Count, POC 191  142 - 424 K/uL   MPV 9.5  0 - 99.8 fL     [facility] reading (PRIMARY) by  Dr. Cleta Alberts on the lateral view there are increased markings present over the cardiac shadow with mildly increased retrocardiac markings       Assessment & Plan:  Patient with Cll presents with cough chest x-ray has mild infiltrate we'll treat with amoxicillin Tessalon Perles. have him discuss his case with Dr. Darnelle Catalan

## 2011-10-02 ENCOUNTER — Telehealth: Payer: Self-pay | Admitting: *Deleted

## 2011-10-02 NOTE — Telephone Encounter (Signed)
Patient called and requested to move his appointment to 11-27-2011

## 2011-10-30 ENCOUNTER — Other Ambulatory Visit: Payer: Medicare Other | Admitting: Lab

## 2011-11-04 ENCOUNTER — Ambulatory Visit: Payer: Medicare Other | Admitting: Oncology

## 2011-11-06 ENCOUNTER — Ambulatory Visit: Payer: Medicare Other | Admitting: Oncology

## 2011-11-19 ENCOUNTER — Other Ambulatory Visit: Payer: Self-pay | Admitting: Physician Assistant

## 2011-11-19 DIAGNOSIS — C9110 Chronic lymphocytic leukemia of B-cell type not having achieved remission: Secondary | ICD-10-CM

## 2011-11-20 ENCOUNTER — Other Ambulatory Visit (HOSPITAL_BASED_OUTPATIENT_CLINIC_OR_DEPARTMENT_OTHER): Payer: Medicare Other | Admitting: Lab

## 2011-11-20 DIAGNOSIS — C9110 Chronic lymphocytic leukemia of B-cell type not having achieved remission: Secondary | ICD-10-CM

## 2011-11-20 LAB — COMPREHENSIVE METABOLIC PANEL (CC13)
AST: 19 U/L (ref 5–34)
BUN: 21 mg/dL (ref 7.0–26.0)
CO2: 27 mEq/L (ref 22–29)
Calcium: 9.2 mg/dL (ref 8.4–10.4)
Chloride: 107 mEq/L (ref 98–107)
Creatinine: 1.1 mg/dL (ref 0.7–1.3)

## 2011-11-20 LAB — MORPHOLOGY: PLT EST: ADEQUATE

## 2011-11-20 LAB — CBC WITH DIFFERENTIAL/PLATELET
BASO%: 0.1 % (ref 0.0–2.0)
Eosinophils Absolute: 0.5 10*3/uL (ref 0.0–0.5)
MCHC: 33.9 g/dL (ref 32.0–36.0)
MONO#: 1 10*3/uL — ABNORMAL HIGH (ref 0.1–0.9)
MONO%: 2.6 % (ref 0.0–14.0)
NEUT#: 2.8 10*3/uL (ref 1.5–6.5)
RBC: 5.28 10*6/uL (ref 4.20–5.82)
RDW: 13.7 % (ref 11.0–14.6)
WBC: 38.6 10*3/uL — ABNORMAL HIGH (ref 4.0–10.3)

## 2011-11-20 LAB — LACTATE DEHYDROGENASE (CC13): LDH: 165 U/L (ref 125–245)

## 2011-11-21 LAB — IGG, IGA, IGM: IgA: 137 mg/dL (ref 68–379)

## 2011-11-21 LAB — BETA 2 MICROGLOBULIN, SERUM: Beta-2 Microglobulin: 2.93 mg/L — ABNORMAL HIGH (ref 1.01–1.73)

## 2011-11-27 ENCOUNTER — Telehealth: Payer: Self-pay | Admitting: *Deleted

## 2011-11-27 ENCOUNTER — Ambulatory Visit (HOSPITAL_BASED_OUTPATIENT_CLINIC_OR_DEPARTMENT_OTHER): Payer: Medicare Other | Admitting: Oncology

## 2011-11-27 VITALS — BP 150/72 | HR 66 | Temp 97.8°F | Resp 20 | Ht 71.5 in | Wt 216.8 lb

## 2011-11-27 DIAGNOSIS — C9110 Chronic lymphocytic leukemia of B-cell type not having achieved remission: Secondary | ICD-10-CM

## 2011-11-27 MED ORDER — INFLUENZA VIRUS VACC SPLIT PF IM SUSP
0.5000 mL | Freq: Once | INTRAMUSCULAR | Status: AC
  Administered 2011-11-27: 0.5 mL via INTRAMUSCULAR
  Filled 2011-11-27: qty 0.5

## 2011-11-27 NOTE — Telephone Encounter (Signed)
05-26-2012 lab only  11-22-2012 lab only  11-29-2012 md

## 2011-11-27 NOTE — Progress Notes (Signed)
ID: Francisco Larson   DOB: [birth year]  MR#: [id number]  ION#:[id number]  PCP: Francisco Lites MD SU: Francisco Kin MD OTHER MD: Francisco Larson, Francisco Larson    INTERVAL HISTORY: Date returns today for followup of his chronic lymphocytic leukemia. The interval history is generally unremarkable. [place], and enjoyed it quite a bit.  REVIEW OF SYSTEMS: He's had a sequence of upper respiratory congestion episodes for the last 4 months. He did have a chest x-ray which was negative and was treated with with penicillin sometime late July or August. Things would get better and then a bit worse. He never had a fever, purulent sputum, or pleurisy. There has been Francisco shortness of breath, rash, drenching sweats, and Francisco adenopathy that he is aware of. He's gained a little bit of weight because his right knee bothers him and he can't exercise as much as he would like. Aside from some heartburn, a detailed review of systems was otherwise entirely negative.  PAST MEDICAL HISTORY: Francisco past medical history on file.  PAST SURGICAL HISTORY: Francisco past surgical history on file.  FAMILY HISTORY Francisco family history on file. The patient's mother died at the age of 54. The patient's father died at the age of 3 from heart disease. He had Francisco brothers. His only sister, currently 27 years old, has a history of late breast cancer and late Francisco colon cancer.  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 His been married 52 years to Francisco Larson, and their [place] are daily lives in [place] and has 4 Larson, and [place] L[place] who lives in [place] and has [place].  Francisco Larson is an elder at [place] L[place].  ADVANCED DIRECTIVES: in place  HEALTH MAINTENANCE: History  Substance Use Topics  . Smoking status: Never Smoker   . Smokeless tobacco: Not on file  . Alcohol Use: Not on file      Colonoscopy:  PAP:  Bone density:  Lipid panel:  Allergies  Allergen Reactions  . Z-Pak (Azithromycin)     Current Outpatient Prescriptions  Medication Sig Dispense Refill  . aspirin 81 MG tablet Take 81 mg by mouth daily.      . cholecalciferol (VITAMIN D) 1000 UNITS tablet Take 1,000 Units by mouth daily.      . fish oil-omega-3 fatty acids 1000 MG capsule Take 2 g by mouth daily.      . pravastatin (PRAVACHOL) 20 MG tablet Take 40 mg by mouth daily.      . vitamin B-12 (CYANOCOBALAMIN) 100 MCG tablet Take 50 mcg by mouth daily.       Current Facility-Administered Medications  Medication Dose Route Frequency Provider Last Rate Last Dose  . influenza  inactive virus vaccine (FLUZONE/FLUARIX) injection 0.5 mL  0.5 mL Intramuscular Tomorrow-1000 Francisco Dell, MD        OBJECTIVE: Francisco Larson who appears well Filed Vitals:   11/27/11 1535  BP: 150/72  Pulse: 66  Temp: 97.8 F (36.6 C)  Resp: 20     Body mass index is 29.82 kg/(m^2).    ECOG FS: 0  Sclerae unicteric Oropharynx clear Francisco cervical or supraclavicular adenopathy Lungs Francisco rales or rhonchi Heart regular rate and rhythm Abd benign; Francisco inguinal adenopathy; Francisco axillary adenopathy MSK Francisco focal spinal tenderness, Francisco peripheral edema Neuro: nonfocal    LAB RESULTS: Lab Results  Component Value Date  WBC 38.6* 11/20/2011   NEUTROABS 2.8 11/20/2011   HGB 16.3 11/20/2011   HCT 48.2 11/20/2011   MCV 91.2 11/20/2011   PLT 154 11/20/2011      Chemistry      Component Value Date/Time   NA 138 11/20/2011 0759   NA 139 05/08/2011 0818   K 4.3 11/20/2011 0759   K 4.4 05/08/2011 0818   CL 107 11/20/2011 0759   CL 108 05/08/2011 0818   CO2 27 11/20/2011 0759   CO2 25 05/08/2011 0818   BUN 21.0 11/20/2011 0759   BUN 21 05/08/2011 0818   CREATININE 1.1 11/20/2011 0759   CREATININE 1.04 05/08/2011 0818      Component Value Date/Time   CALCIUM 9.2 11/20/2011 0759   CALCIUM 8.5 05/08/2011 0818   ALKPHOS  67 11/20/2011 0759   ALKPHOS 55 05/08/2011 0818   AST 19 11/20/2011 0759   AST 20 05/08/2011 0818   ALT 22 11/20/2011 0759   ALT 17 05/08/2011 0818   BILITOT 0.57 11/20/2011 0759   BILITOT 0.5 05/08/2011 0818       Francisco results found for this basename: LABCA2    Francisco components found with this basename: LABCA125    Francisco results found for this basename: INR:1;PROTIME:1 in the last 168 hours  Urinalysis Francisco results found for this basename: colorurine,  appearanceur,  labspec,  phurine,  glucoseu,  hgbur,  bilirubinur,  ketonesur,  proteinur,  urobilinogen,  nitrite,  leukocytesur    STUDIES: Chest x-ray 07/17/2011 was negative  ASSESSMENT: 76 y.o. Francisco Larson with a history of well-differentiated lymphocytic lymphoma/chronic lymphoid leukemia originally diagnosed April 1997, treated with Rituxan in 2001 and 2004, and not requiring treatment since that time.   PLAN: I think the minimal increase in his beta 2 microglobulin and the minimal drop in his albumin are going to be due to all his bronchitis episodes. There may be an element of allergy since this has been going on for quite a while, but his total IgG is normal so he does not like for antibodies.  His CLL is generally stable and certainly there is Francisco indication for treatment at this point. He will have a flu shot today, and have labs in 6 and 12 months. He will see me again in one year. He knows to call for any problems that may develop before that.  Francisco Larson C    11/27/2011

## 2012-01-16 ENCOUNTER — Other Ambulatory Visit: Payer: Self-pay | Admitting: Internal Medicine

## 2012-01-16 NOTE — Telephone Encounter (Signed)
Please pull paper chart.  

## 2012-01-16 NOTE — Telephone Encounter (Signed)
Chart pulled to PA pool at nurses station [phone]

## 2012-01-27 ENCOUNTER — Other Ambulatory Visit: Payer: Self-pay | Admitting: Emergency Medicine

## 2012-01-27 ENCOUNTER — Encounter: Payer: Self-pay | Admitting: Emergency Medicine

## 2012-01-27 ENCOUNTER — Ambulatory Visit (INDEPENDENT_AMBULATORY_CARE_PROVIDER_SITE_OTHER): Payer: Medicare Other | Admitting: Emergency Medicine

## 2012-01-27 ENCOUNTER — Ambulatory Visit: Payer: Medicare Other

## 2012-01-27 VITALS — BP 128/82 | HR 67 | Temp 97.8°F | Resp 16 | Ht 71.5 in | Wt 213.6 lb

## 2012-01-27 DIAGNOSIS — Z125 Encounter for screening for malignant neoplasm of prostate: Secondary | ICD-10-CM

## 2012-01-27 DIAGNOSIS — R195 Other fecal abnormalities: Secondary | ICD-10-CM

## 2012-01-27 DIAGNOSIS — C9501 Acute leukemia of unspecified cell type, in remission: Secondary | ICD-10-CM

## 2012-01-27 DIAGNOSIS — R05 Cough: Secondary | ICD-10-CM

## 2012-01-27 DIAGNOSIS — Z0000 Encounter for general adult medical examination without abnormal findings: Secondary | ICD-10-CM

## 2012-01-27 DIAGNOSIS — I10 Essential (primary) hypertension: Secondary | ICD-10-CM

## 2012-01-27 DIAGNOSIS — M25569 Pain in unspecified knee: Secondary | ICD-10-CM | POA: Insufficient documentation

## 2012-01-27 DIAGNOSIS — R5383 Other fatigue: Secondary | ICD-10-CM

## 2012-01-27 DIAGNOSIS — R5381 Other malaise: Secondary | ICD-10-CM

## 2012-01-27 DIAGNOSIS — E785 Hyperlipidemia, unspecified: Secondary | ICD-10-CM | POA: Insufficient documentation

## 2012-01-27 LAB — POCT URINALYSIS DIPSTICK
Glucose, UA: NEGATIVE
Ketones, UA: NEGATIVE
Leukocytes, UA: NEGATIVE
Spec Grav, UA: 1.02
Urobilinogen, UA: 0.2

## 2012-01-27 LAB — POCT UA - MICROSCOPIC ONLY
Bacteria, U Microscopic: NEGATIVE
Casts, Ur, LPF, POC: NEGATIVE

## 2012-01-27 LAB — COMPREHENSIVE METABOLIC PANEL
AST: 22 U/L (ref 0–37)
Albumin: 3.9 g/dL (ref 3.5–5.2)
Alkaline Phosphatase: 68 U/L (ref 39–117)
Potassium: 4.6 mEq/L (ref 3.5–5.3)
Sodium: 138 mEq/L (ref 135–145)
Total Protein: 6.3 g/dL (ref 6.0–8.3)

## 2012-01-27 LAB — LIPID PANEL
LDL Cholesterol: 112 mg/dL — ABNORMAL HIGH (ref 0–99)
VLDL: 24 mg/dL (ref 0–40)

## 2012-01-27 LAB — PSA: PSA: 2.02 ng/mL (ref <=4.00)

## 2012-01-27 LAB — IFOBT (OCCULT BLOOD): IFOBT: POSITIVE

## 2012-01-27 MED ORDER — PRAVASTATIN SODIUM 20 MG PO TABS: 20.0000 mg | ORAL_TABLET | Freq: Every day | ORAL | Status: DC

## 2012-01-27 NOTE — Progress Notes (Signed)
@[email or web address] @  Patient ID: Francisco Larson MRN: [id number], DOB: September 13, 1935 77 y.o. Date of Encounter: 01/27/2012, 8:45 AM  Primary Physician: No primary provider on file.  Chief Complaint: Physical (CPE)  HPI: 77 y.o. y/o male with history noted below here for CPE.  Doing well. No issues/complaints.  Review of Systems: Consitutional: No fever, chills, , night sweats, lymphadenopathy, or weight changes patient states that some days he just gets up and feels extremely fatigued with no energy. Eyes: No visual changes, eye redness, or discharge. ENT/Mouth: Ears: No otalgia, tinnitus, hearing loss, discharge. Nose: No congestion, rhinorrhea, sinus pain, or epistaxis. Throat: No sore throat, post nasal drip, or teeth pain. Cardiovascular: No CP, palpitations, diaphoresis, DOE, edema, orthopnea, PND. Respiratory:  hemoptysis, SOB, or wheezing. He has an intermittent cough. Occasionally is productive of clear phlegm. He is worse in the morning. He's had no specific chest pain or shortness of breath Gastrointestinal: No anorexia, dysphagia, reflux, pain, nausea, vomiting, hematemesis, diarrhea, constipation, BRBPR, or melena. The patient did have an episode recently of left-sided pain which resolved on its own . Genitourinary: No dysuria, frequency, urgency, hematuria, incontinence, nocturia, decreased urinary stream, discharge, impotence, or testicular pain/masses. Musculoskeletal: No decreased ROM, myalgias, stiffness, joint swelling, or weakness. Skin: No rash, erythema, lesion changes, pain, warmth, jaundice, or pruritis. Neurological: No headache, dizziness, syncope, seizures, tremors, memory loss, coordination problems, or paresthesias. Psychological: No anxiety, depression, hallucinations, SI/HI. Endocrine: No fatigue, polydipsia, polyphagia, polyuria, or known diabetes. All other systems were reviewed and are otherwise negative.  No past medical history on file.   No past surgical history  on file.  Home Meds:  Prior to Admission medications   Medication Sig Start Date End Date Taking? Authorizing Provider  aspirin 81 MG tablet Take 81 mg by mouth daily.   Yes Historical Provider, MD  cholecalciferol (VITAMIN D) 1000 UNITS tablet Take 1,000 Units by mouth daily.   Yes Historical Provider, MD  fish oil-omega-3 fatty acids 1000 MG capsule Take 2 g by mouth daily.   Yes Historical Provider, MD  pravastatin (PRAVACHOL) 20 MG tablet Take 40 mg by mouth daily.   Yes Historical Provider, MD  pravastatin (PRAVACHOL) 40 MG tablet Take 1 tablet (40 mg total) by mouth daily. Needs office visit for further refills. 01/16/12  Yes Ryan M Dunn, PA-C  vitamin B-12 (CYANOCOBALAMIN) 100 MCG tablet Take 50 mcg by mouth daily.   Yes Historical Provider, MD    Allergies:  Allergies  Allergen Reactions  . Z-Pak (Azithromycin)     History   Social History  . Marital Status: Married    Spouse Name: N/A    Number of Children: N/A  . Years of Education: N/A   Occupational History  . Not on file.   Social History Main Topics  . Smoking status: Never Smoker   . Smokeless tobacco: Not on file  . Alcohol Use: Yes  . Drug Use: No  . Sexually Active: Not on file   Other Topics Concern  . Not on file   Social History Narrative  . No narrative on file    No family history on file.  Physical Exam:  Blood pressure 128/82, pulse 67, temperature 97.8 F (36.6 C), temperature source Oral, resp. rate 16, height 5' 11.5" (1.816 m), weight 213 lb 9.6 oz (96.888 kg), SpO2 97.00%.  General: Well developed, well nourished, in no acute distress. HEENT: Normocephalic, atraumatic. Conjunctiva pink, sclera non-icteric. Pupils 2 mm constricting to 1 mm, round, regular, and equally  reactive to light and accomodation. EOMI. Internal auditory canal clear. TMs with good cone of light and without pathology. Nasal mucosa pink. Nares are without discharge. No sinus tenderness. Oral mucosa pink. Dentition  normal. He has bilateral hearing . Pharynx without exudate.   Neck: Supple. Trachea midline. No thyromegaly. Full ROM. No lymphadenopathy. Lungs: Clear to auscultation bilaterally without wheezes, rales, or rhonchi. Breathing is of normal effort and unlabored. Cardiovascular: RRR with S1 S2. No murmurs, rubs, or gallops appreciated. Distal pulses 2+ symmetrically. No carotid or abdominal bruits.  Abdomen: Soft, non-tender, non-distended with normoactive bowel sounds. No hepatosplenomegaly or masses. No rebound/guarding. No CVA tenderness. Without hernias.  Rectal: No external hemorrhoids or fissures. Rectal vault without masses.   Genitourinary:   circumcised male. No penile lesions. Testes descended bilaterally, and smooth without tenderness or masses. The left lobe of the prostate is slightly larger than the right lobe there may be some firmness mid lobe on the right  Musculoskeletal: Full range of motion and 5/5 strength throughout. Without swelling, atrophy, tenderness, crepitus, or warmth. Extremities without clubbing, cyanosis, or edema. Calves supple. Skin: Warm and moist without erythema, ecchymosis, wounds, or rash. Neuro: A+Ox3. CN II-XII grossly intact. Moves all extremities spontaneously. Full sensation throughout. Normal gait. DTR 2+ throughout upper and lower extremities. Finger to nose intact. Psych:  Responds to questions appropriately with a normal affect.   Studies: CBC, CMET, Lipid, PSA, TSH,  all pending. Patient is stable with regard to his CLL. He sees Dr. Darnelle Catalan regularly. His last white count was elevated at 38,000 but is not currently under treatment . [facility] reading (PRIMARY) by  Dr.Daub minimally increased markings right base. Patient has a history of CLL with chronic cough   EKG normal  Results for orders placed in visit on 01/27/12  POCT UA - MICROSCOPIC ONLY      Component Value Range   WBC, Ur, HPF, POC neg     RBC, urine, microscopic 0-2     Bacteria, U  Microscopic neg     Mucus, UA neg     Epithelial cells, urine per micros 0-1     Crystals, Ur, HPF, POC neg     Casts, Ur, LPF, POC neg     Yeast, UA neg    POCT URINALYSIS DIPSTICK      Component Value Range   Color, UA yellow     Clarity, UA clear     Glucose, UA neg     Bilirubin, UA neg     Ketones, UA neg     Spec Grav, UA 1.020     Blood, UA trace     pH, UA 6.0     Protein, UA neg     Urobilinogen, UA 0.2     Nitrite, UA neg     Leukocytes, UA Negative    IFOBT (OCCULT BLOOD)      Component Value Range   IFOBT Positive     Assessment/Plan:  77 y.o. y/o   male here for CPE. Patient does have morning fatigue. It is been a number of years since he has had cardiac screening but will go ahead and proceed with that because of his age family history and hyperlipidemia. He has had some recent memory issues we'll decrease his pravastatin to a half tablet a day. He does have some mild reflux and this may be the source of his cough. It could also be tied in with his CLL and leukemic infiltrates Patient also has some  blood in his stool today we'll go ahead and make referral to GI -  Signed, Earl Lites, MD 01/27/2012 8:45 AM

## 2012-01-27 NOTE — Patient Instructions (Signed)
He can decrease her pravastatin to half tablet a day. He will be given a referral to Dr. Garnette Scheuermann . I will notify you once I get the results of your labs.

## 2012-01-28 LAB — CBC WITH DIFFERENTIAL/PLATELET
Basophils Absolute: 0.1 10*3/uL (ref 0.0–0.1)
HCT: 50 % (ref 39.0–52.0)
Lymphocytes Relative: 87 % — ABNORMAL HIGH (ref 12–46)
Neutro Abs: 3 10*3/uL (ref 1.7–7.7)
Platelets: 184 10*3/uL (ref 150–400)
RDW: 14 % (ref 11.5–15.5)
WBC: 32 10*3/uL — ABNORMAL HIGH (ref 4.0–10.5)

## 2012-01-29 ENCOUNTER — Encounter: Payer: Self-pay | Admitting: Family Medicine

## 2012-01-29 LAB — T4, FREE: Free T4: 1.01 ng/dL (ref 0.80–1.80)

## 2012-02-09 ENCOUNTER — Encounter: Payer: Self-pay | Admitting: Oncology

## 2012-02-10 ENCOUNTER — Telehealth: Payer: Self-pay | Admitting: Oncology

## 2012-02-10 NOTE — Telephone Encounter (Signed)
Sent letter to patient from Dr. Magrinat. °

## 2012-02-24 ENCOUNTER — Ambulatory Visit (INDEPENDENT_AMBULATORY_CARE_PROVIDER_SITE_OTHER): Payer: Medicare PPO | Admitting: Gastroenterology

## 2012-02-24 ENCOUNTER — Encounter: Payer: Self-pay | Admitting: Gastroenterology

## 2012-02-24 VITALS — BP 146/78 | HR 72 | Ht 71.5 in | Wt 216.0 lb

## 2012-02-24 DIAGNOSIS — R195 Other fecal abnormalities: Secondary | ICD-10-CM

## 2012-02-24 NOTE — Patient Instructions (Addendum)
2 sets of home stools cards.  If any are positive, then colonoscopy. If none are positive, then you need routine screening colonoscopy 07/2014 for FH of colon cancer (sister). A copy of this information will be made available to Dr. Cleta Alberts.

## 2012-02-24 NOTE — Progress Notes (Signed)
HPI: This is a  very pleasant 77 year old man whom I am meeting for the first time today.  He had a colonoscopy with Dr. Danise Edge July 2011 for family history of colon cancer. I do not have a copy of the actual report.  He also had normal colonoscopy 2006, normal colonoscopy 2000 these were done by Dr. Melchor Amour.   He was found on routine physcial exam to have fobt + stool, done for unclear reasons. DRE examination.  Had perirectal abscess in 2000, remotely   Has had no changes in his bowels.  Weight stable. No blood thinners.  His sister had colon cancer in her 76's   Review of systems: Pertinent positive and negative review of systems were noted in the above HPI section. Complete review of systems was performed and was otherwise normal.    Past Medical History  Diagnosis Date  . Hyperlipidemia   . Vitamin D deficiency   . Leukemia, chronic lymphocytic     Past Surgical History  Procedure Laterality Date  . Surgery of lip      tumor  . Tonsillectomy      Current Outpatient Prescriptions  Medication Sig Dispense Refill  . cholecalciferol (VITAMIN D) 1000 UNITS tablet Take 1,000 Units by mouth daily.      . fish oil-omega-3 fatty acids 1000 MG capsule Take 2 g by mouth daily.      . pravastatin (PRAVACHOL) 20 MG tablet Take 1 tablet (20 mg total) by mouth daily.  30 tablet  11  . vitamin B-12 (CYANOCOBALAMIN) 100 MCG tablet Take 50 mcg by mouth daily.       No current facility-administered medications for this visit.    Allergies as of 02/24/2012 - Review Complete 02/24/2012  Allergen Reaction Noted  . Z-pak (azithromycin)  07/17/2011    Family History  Problem Relation Age of Onset  . Colon cancer Sister   . Heart disease Father   . COPD Mother     History   Social History  . Marital Status: Married    Spouse Name: N/A    Number of Children: 2  . Years of Education: N/A   Occupational History  . Retired     Professor [employer]   Social History Main  Topics  . Smoking status: Never Smoker   . Smokeless tobacco: Never Used  . Alcohol Use: Yes  . Drug Use: No  . Sexually Active: Not on file   Other Topics Concern  . Not on file   Social History Narrative  . No narrative on file       Physical Exam: BP 146/78  Pulse 72  Ht 5' 11.5" (1.816 m)  Wt 216 lb (97.977 kg)  BMI 29.71 kg/m2 Constitutional: generally well-appearing Psychiatric: alert and oriented x3 Eyes: extraocular movements intact Mouth: oral pharynx moist, no lesions Neck: supple no lymphadenopathy Cardiovascular: heart regular rate and rhythm Lungs: clear to auscultation bilaterally Abdomen: soft, nontender, nondistended, no obvious ascites, no peritoneal signs, normal bowel sounds Extremities: no lower extremity edema bilaterally Skin: no lesions on visible extremities    Assessment and plan: 77 y.o. male with  family history colon cancer, recent digital rectal exam positive for fecal occult blood  His last colonoscopy was about 2-1/2 years ago. He would normally be due for repeat colonoscopy at 5 year interval, July 2016. As part of a physical exam he had digital rectal examination this was positive for fecal occult blood. He was not really due for colon cancer  screening. Digital rectal examination is also not an approved way to screen for colon cancer, spontaneous stools are much preferred since there can be minor trauma with finger insertion and lead to small, microscopic bleeding. We discussed repeating his colonoscopy now since he does have occult blood positive however instead since he has no other overt changes or worrisome signs and we'll simply repeat his Hemoccult testing with 2 sets of home fecal occult testing and if any are positive we'll proceed with colonoscopy. Otherwise he will continue to remain under colonoscopy surveillance, high-risk screening.

## 2012-03-03 ENCOUNTER — Ambulatory Visit: Payer: Medicare PPO

## 2012-03-03 ENCOUNTER — Ambulatory Visit (INDEPENDENT_AMBULATORY_CARE_PROVIDER_SITE_OTHER): Payer: Medicare PPO | Admitting: Family Medicine

## 2012-03-03 ENCOUNTER — Telehealth: Payer: Self-pay | Admitting: Family Medicine

## 2012-03-03 VITALS — BP 122/66 | HR 74 | Temp 97.9°F | Resp 16 | Ht 72.0 in | Wt 218.4 lb

## 2012-03-03 DIAGNOSIS — R42 Dizziness and giddiness: Secondary | ICD-10-CM

## 2012-03-03 DIAGNOSIS — S0990XA Unspecified injury of head, initial encounter: Secondary | ICD-10-CM

## 2012-03-03 DIAGNOSIS — M25532 Pain in left wrist: Secondary | ICD-10-CM

## 2012-03-03 DIAGNOSIS — M25519 Pain in unspecified shoulder: Secondary | ICD-10-CM

## 2012-03-03 NOTE — Telephone Encounter (Signed)
Spoke with patient and let him know that Radiologist agreed with Dr. Patsy Lager and the wrist film was negative.  He agreed to come back for follow up if pain persists or gets any worse.

## 2012-03-03 NOTE — Progress Notes (Signed)
[facility] [address] [place] [address] [phone]  Date:  03/03/2012   Name:  Francisco Larson   DOB:  [birth year]   MRN:  [id number]  PCP:  Francisco Edin, MD    Chief Complaint: Wrist Pain, Hip Pain and Head Injury   History of Present Illness:  Francisco Larson is a 77 y.o. very pleasant male patient who presents with the following:  Seen in conjunction with Francisco Oar, PA-C.  I personally interviewed and examined the patient and reviewed his xray.   He fell yesterday and hurt his left side.  The main problem is his wrist- he is concerned it could be broken. He did feel a bit dizzy upon standing up last night.  Today he is feeling better and does not have a severe HA or feel confused or slowed down.    Patient Active Problem List  Diagnosis  . CLL (chronic lymphocytic leukemia)  . Cough  . Knee joint pain  . Hyperlipidemia    Past Medical History  Diagnosis Date  . Hyperlipidemia   . Vitamin D deficiency   . Leukemia, chronic lymphocytic     Past Surgical History  Procedure Laterality Date  . Surgery of lip      tumor  . Tonsillectomy      History  Substance Use Topics  . Smoking status: Never Smoker   . Smokeless tobacco: Never Used  . Alcohol Use: Yes    Family History  Problem Relation Age of Onset  . Colon cancer Sister   . Heart disease Father   . COPD Mother     Allergies  Allergen Reactions  . Z-Pak (Azithromycin)     Medication list has been reviewed and updated.  Current Outpatient Prescriptions on File Prior to Visit  Medication Sig Dispense Refill  . cholecalciferol (VITAMIN D) 1000 UNITS tablet Take 1,000 Units by mouth daily.      . fish oil-omega-3 fatty acids 1000 MG capsule Take 2 g by mouth daily.      . pravastatin (PRAVACHOL) 20 MG tablet Take 1 tablet (20 mg total) by mouth daily.  30 tablet  11  . vitamin B-12 (CYANOCOBALAMIN) 100 MCG tablet Take 50 mcg by mouth daily.       No current  facility-administered medications on file prior to visit.    Review of Systems:  As per HPI- otherwise negative.   Physical Examination: Filed Vitals:   03/03/12 0759  BP: 122/66  Pulse: 74  Temp: 97.9 F (36.6 C)  Resp: 16   Filed Vitals:   03/03/12 0759  Height: 6' (1.829 m)  Weight: 218 lb 6.4 oz (99.066 kg)   Body mass index is 29.61 kg/(m^2). Ideal Body Weight: Weight in (lb) to have BMI = 25: 183.9  GEN: WDWN, NAD, Non-toxic, A & O x 3 HEENT: Atraumatic, Normocephalic. Neck supple. No masses, No LAD.  Bilateral TM wnl, oropharynx normal.  PEERL,EOMI.   No raccoon eyes. He has a slight contusion in the left brow but no swelling or acute tenderness, no step off Ears and Nose: No external deformity. CV: RRR, No M/G/R. No JVD. No thrill. No extra heart sounds. PULM: CTA B, no wheezes, crackles, rhonchi. No retractions. No resp. distress. No accessory muscle use. ABD: S, NT, ND, +BS. No rebound. No HSM. EXTR: No c/c/e NEURO Normal gait. Normal strength, sensation and dtr all extremities, negative romberg.   PSYCH: Normally interactive. Conversant. Not depressed  or anxious appearing.  Calm demeanor.  Left wrist: there is soreness of the dorsal wrist, but no significant swelling, no bruising.  He has good ROM and strength of the hand and wrist.  Left shoulder and elbow benign except the shoulder has reduced ROM at baseline  LEFT WRIST - COMPLETE 3+ VIEW  Comparison: None.  Findings: The radiocarpal joint space appears normal. The ulnar  styloid is intact. The carpal bones are in normal position.  Alignment is normal.  IMPRESSION:  Negative.  Assessment and Plan: Pain, wrist, left - Plan: DG Wrist Complete Left  Pain in joint, shoulder region  Dizziness  Head injury, unspecified  Wrist contusion- placed in splint as we had questioned a distal ulna fracture.  Once OR report received Francisco Larson was called and given update- negative film.  Offered a CT scan today to  rule- out a head bleed but at this time he does not feel this is necessary.  Cautioned him to seek care right away if he has a worse HA, dizziness or nausea/ vomiting    Francisco Char, MD

## 2012-03-03 NOTE — Progress Notes (Signed)
  Subjective:    Patient ID: Francisco Larson, male    DOB: 1935-07-12, 77 y.o.   MRN: [id number]  HPI A 77 year old male presents with left wrist and left hip pain after a fall yesterday.  Pt slipped and fell on left side on slick deck around pool.  Head injury with no LOC.  Noticed left hip pain last night.  Pt complains of episodic dizziness/HA since hitting his head.  He iced his wrist with little relief.  Pt currently obtaining stool samples for Dr. Cleta Alberts and is unable to take Ibuprofen.     Past Medical History  Diagnosis Date  . Hyperlipidemia   . Vitamin D deficiency   . Leukemia, chronic lymphocytic     Past Surgical History  Procedure Laterality Date  . Surgery of lip      tumor  . Tonsillectomy      Prior to Admission medications   Medication Sig Start Date End Date Taking? Authorizing Provider  cholecalciferol (VITAMIN D) 1000 UNITS tablet Take 1,000 Units by mouth daily.   Yes Historical Provider, MD  fish oil-omega-3 fatty acids 1000 MG capsule Take 2 g by mouth daily.   Yes Historical Provider, MD  pravastatin (PRAVACHOL) 20 MG tablet Take 1 tablet (20 mg total) by mouth daily. 01/27/12  Yes Collene Gobble, MD  vitamin B-12 (CYANOCOBALAMIN) 100 MCG tablet Take 50 mcg by mouth daily.   Yes Historical Provider, MD    Allergies  Allergen Reactions  . Z-Pak (Azithromycin)     History   Social History  . Marital Status: Married    Number of Children: 2   Occupational History  . Retired     Professor [employer]   Social History Main Topics  . Smoking status: Never Smoker   . Smokeless tobacco: Never Used  . Alcohol Use: Yes  . Drug Use: No   Family History  Problem Relation Age of Onset  . Colon cancer Sister   . Heart disease Father   . COPD Mother     Review of Systems    as above Objective:   Physical Exam  BP 122/66  Pulse 74  Temp(Src) 97.9 F (36.6 C) (Oral)  Resp 16  Ht 6' (1.829 m)  Wt 218 lb 6.4 oz (99.066 kg)  BMI 29.61 kg/m2  SpO2  95%  HEENT:  PERRLA.  No left periorbital tenderness or hyphema.  EOMs intact. MSK:  Full ROM of left wrist, left shoulder, and hip joint.  Tenderness and erythema over left carpals dorsally.  Pain with wrist flexion and extension, more so with flexion.  Negative scaphoid tenderness. 5/5 grip strength and hip strength. Neuro:  A&Ox3.  Cranial nerves and sensation intact.  LEFT wrist: [facility] reading (PRIMARY) by  Dr. Patsy Lager.  Question abnormality of ulna seen on one view.       Assessment & Plan:  Pain, wrist, left - Plan: DG Wrist Complete Left.  Applied left non-spica wrist splint to provide support and minimize movement of pt wrist.  Pt currently obtaining stool samples so will avoid Ibuprofen and continue taking Tylenol for pain.  Pain in joint, shoulder region- Pt has history of pain in left shoulder, today pain is minimally more painful than normal.  Pt will take Tylenol for pain.  Dizziness- Pt most likely has mild concussion.  Pt instructed to RTC if dizziness worsens, has n/v.  Head injury, unspecified- see above.

## 2012-03-12 ENCOUNTER — Other Ambulatory Visit: Payer: Medicare PPO

## 2012-03-12 DIAGNOSIS — R195 Other fecal abnormalities: Secondary | ICD-10-CM

## 2012-03-12 LAB — HEMOCCULT SLIDES (X 3 CARDS)
Fecal Occult Blood: NEGATIVE
OCCULT 1: NEGATIVE
OCCULT 2: NEGATIVE
OCCULT 2: NEGATIVE
OCCULT 3: NEGATIVE
OCCULT 4: NEGATIVE

## 2012-04-13 ENCOUNTER — Encounter: Payer: Self-pay | Admitting: Emergency Medicine

## 2012-05-25 ENCOUNTER — Other Ambulatory Visit: Payer: Self-pay | Admitting: Physician Assistant

## 2012-05-25 DIAGNOSIS — C9110 Chronic lymphocytic leukemia of B-cell type not having achieved remission: Secondary | ICD-10-CM

## 2012-05-26 ENCOUNTER — Other Ambulatory Visit (HOSPITAL_BASED_OUTPATIENT_CLINIC_OR_DEPARTMENT_OTHER): Payer: Medicare PPO | Admitting: Lab

## 2012-05-26 DIAGNOSIS — C9110 Chronic lymphocytic leukemia of B-cell type not having achieved remission: Secondary | ICD-10-CM

## 2012-05-26 LAB — CBC WITH DIFFERENTIAL/PLATELET
Basophils Absolute: 0.1 10*3/uL (ref 0.0–0.1)
HCT: 47.6 % (ref 38.4–49.9)
HGB: 15.8 g/dL (ref 13.0–17.1)
LYMPH%: 87.7 % — ABNORMAL HIGH (ref 14.0–49.0)
MCH: 29.9 pg (ref 27.2–33.4)
MONO#: 0.9 10*3/uL (ref 0.1–0.9)
NEUT%: 8.8 % — ABNORMAL LOW (ref 39.0–75.0)
Platelets: 162 10*3/uL (ref 140–400)
WBC: 37.6 10*3/uL — ABNORMAL HIGH (ref 4.0–10.3)
lymph#: 33 10*3/uL — ABNORMAL HIGH (ref 0.9–3.3)

## 2012-05-26 LAB — COMPREHENSIVE METABOLIC PANEL (CC13)
Albumin: 2.9 g/dL — ABNORMAL LOW (ref 3.5–5.0)
BUN: 13.2 mg/dL (ref 7.0–26.0)
Calcium: 8.6 mg/dL (ref 8.4–10.4)
Chloride: 108 mEq/L — ABNORMAL HIGH (ref 98–107)
Glucose: 112 mg/dl — ABNORMAL HIGH (ref 70–99)
Potassium: 4.4 mEq/L (ref 3.5–5.1)

## 2012-05-26 LAB — MORPHOLOGY: PLT EST: ADEQUATE

## 2012-05-27 LAB — IGG, IGA, IGM
IgA: 128 mg/dL (ref 68–379)
IgG (Immunoglobin G), Serum: 875 mg/dL (ref 650–1600)

## 2012-05-27 LAB — BETA 2 MICROGLOBULIN, SERUM: Beta-2 Microglobulin: 2.77 mg/L — ABNORMAL HIGH (ref 1.01–1.73)

## 2012-06-01 ENCOUNTER — Other Ambulatory Visit: Payer: Self-pay | Admitting: Oncology

## 2012-08-04 ENCOUNTER — Ambulatory Visit (INDEPENDENT_AMBULATORY_CARE_PROVIDER_SITE_OTHER): Payer: Medicare PPO | Admitting: Emergency Medicine

## 2012-08-04 ENCOUNTER — Ambulatory Visit: Payer: Medicare PPO

## 2012-08-04 ENCOUNTER — Other Ambulatory Visit (HOSPITAL_COMMUNITY): Payer: Self-pay | Admitting: Oncology

## 2012-08-04 VITALS — BP 122/78 | HR 62 | Temp 98.0°F | Resp 16 | Ht 71.5 in | Wt 211.0 lb

## 2012-08-04 DIAGNOSIS — Z856 Personal history of leukemia: Secondary | ICD-10-CM

## 2012-08-04 DIAGNOSIS — R05 Cough: Secondary | ICD-10-CM

## 2012-08-04 DIAGNOSIS — C9110 Chronic lymphocytic leukemia of B-cell type not having achieved remission: Secondary | ICD-10-CM

## 2012-08-04 LAB — POCT CBC
Granulocyte percent: 15.5 %G — AB (ref 37–80)
HCT, POC: 49.9 % (ref 43.5–53.7)
Lymph, poc: 30.1 — AB (ref 0.6–3.4)
MCHC: 32.3 g/dL (ref 31.8–35.4)
MCV: 94.7 fL (ref 80–97)
POC LYMPH PERCENT: 78.6 %L — AB (ref 10–50)
RDW, POC: 14.1 %

## 2012-08-04 LAB — PULMONARY FUNCTION TEST

## 2012-08-04 MED ORDER — HYDROCOD POLST-CHLORPHEN POLST 10-8 MG/5ML PO LQCR: ORAL | Status: DC

## 2012-08-04 NOTE — Patient Instructions (Addendum)

## 2012-08-04 NOTE — Progress Notes (Signed)
  Subjective:    Patient ID: Francisco Larson, male    DOB: [birth year], 77 y.o.   MRN: [id number]  HPI patient's illness began one month ago. He has a history of chronic lymphocytic leukemia. This is followed by Dr. Daneil Dolin not. He has been stable from this disease had not required treatment for at least 10 years now. He started about one month ago with chest congestion. He occasionally is able to bring up a small amount of mucous. He usually takes Mucinex when he starts to feel tight in his chest. Recently he has coughed up some phlegm with color to it. He feels tight and feels like mucus is stuck in his chest. He has had no true chest pain. He has had no significant shortness of breath    Review of Systems     Objective:   Physical Exam patient is alert and cooperative and his throat is normal. Chest exam reveals clear lung fields. He has good air exchange with no wheezes. Cardiac exam reveals a regular rate there are no murmurs rubs or gallops appreciated. Abdomen is soft nontender.He is  In no distress. He has hearing aids in both ears. His nose is normal. PFT normal Results for orders placed in visit on 08/04/12  POCT CBC      Result Value Range   WBC 38.3 (*) 4.6 - 10.2 K/uL   Lymph, poc 30.1 (*) 0.6 - 3.4   POC LYMPH PERCENT 78.6 (*) 10 - 50 %L   MID (cbc) 2.3 (*) 0 - 0.9   POC MID % 5.9  0 - 12 %M   POC Granulocyte 5.9  2 - 6.9   Granulocyte percent 15.5 (*) 37 - 80 %G   RBC 5.27  4.69 - 6.13 M/uL   Hemoglobin 16.1  14.1 - 18.1 g/dL   HCT, POC 13.0  86.5 - 53.7 %   MCV 94.7  80 - 97 fL   MCH, POC 30.6  27 - 31.2 pg   MCHC 32.3  31.8 - 35.4 g/dL   RDW, POC 78.4     Platelet Count, POC 169  142 - 424 K/uL   MPV 10.1  0 - 99.8 fL   [facility] reading (PRIMARY) by  Dr Cleta Alberts is minimal increase interstitial markings       Assessment & Plan:  . Call placed to Dr. Darnelle Catalan.. referral made to pulmonary specialist. Tussionex for cough   CBC chest x-ray and pulmonary function tests are  ordered.

## 2012-08-05 ENCOUNTER — Other Ambulatory Visit (HOSPITAL_COMMUNITY): Payer: Self-pay | Admitting: Oncology

## 2012-08-13 ENCOUNTER — Institutional Professional Consult (permissible substitution): Payer: Medicare PPO | Admitting: Internal Medicine

## 2012-08-18 ENCOUNTER — Other Ambulatory Visit: Payer: Self-pay

## 2012-08-19 ENCOUNTER — Ambulatory Visit (INDEPENDENT_AMBULATORY_CARE_PROVIDER_SITE_OTHER): Payer: Medicare PPO | Admitting: Internal Medicine

## 2012-08-19 ENCOUNTER — Encounter: Payer: Self-pay | Admitting: Internal Medicine

## 2012-08-19 VITALS — BP 120/78 | HR 75 | Temp 97.0°F | Ht 71.5 in | Wt 217.0 lb

## 2012-08-19 DIAGNOSIS — R05 Cough: Secondary | ICD-10-CM

## 2012-08-19 MED ORDER — FAMOTIDINE 20 MG PO TABS: ORAL_TABLET | ORAL | Status: DC

## 2012-08-19 MED ORDER — PANTOPRAZOLE SODIUM 40 MG PO TBEC: 40.0000 mg | DELAYED_RELEASE_TABLET | Freq: Every day | ORAL | Status: DC

## 2012-08-19 NOTE — Patient Instructions (Addendum)
Pantoprazole (protonix) 40 mg   Take 30-60 min before first meal of the day and Pepcid 20 mg one bedtime - this is the best way to tell whether stomach acid is contributing to your problem if better ok to wean off in a month but restart at the very onset of cough - if not better after 2 weeks of treatment need follow up office visit.   GERD (REFLUX)  is an extremely common cause of respiratory symptoms, many times with no significant heartburn at all.    It can be treated with medication, but also with lifestyle changes including avoidance of late meals, excessive alcohol, smoking cessation, and avoid fatty foods, chocolate, peppermint, colas, red wine, and acidic juices such as orange juice.  NO MINT OR MENTHOL PRODUCTS SO NO COUGH DROPS  USE SUGARLESS CANDY INSTEAD (jolley ranchers or Stover's)  NO OIL BASED VITAMINS - use powdered substitutes and eat more salmon

## 2012-08-19 NOTE — Progress Notes (Signed)
  Subjective:    Patient ID: Francisco Larson, male    DOB: [birth year]  MRN: [id number]  HPI   23 yowm never smoked with repeat pattern since 2011 recurrent cough worse in winter and also summers with assoc chest tightness referred by Dr Cleta Alberts 08/19/2012 to pulmonary clinic.   08/19/2012 1st pulmonary eval/ Shiela Bruns cc waxing and waning cough x 3 years almost completely goes away assoc with overt HB symptoms.  Seems to always be Worse first thing in am but no excess mucus.better with mucinex, no brought on by ex or eating,  ? Related to voice use.   Not much better with tessilon - tussionex helps but makes him sleepy.   No obvious daytime variabilty or assoc cp, subjective wheeze overt sinus   symptoms. No unusual exp hx or h/o childhood pna/ asthma or knowledge of premature birth.   Sleeping ok without nocturnal  or early am exacerbation  of respiratory  c/o's or need for noct saba. Also denies any obvious fluctuation of symptoms with weather or environmental changes or other aggravating or alleviating factors except as outlined above    Review of Systems  Constitutional: Negative for fever, chills, activity change, appetite change and unexpected weight change.  HENT: Positive for sneezing. Negative for congestion, sore throat, rhinorrhea, trouble swallowing, dental problem, voice change and postnasal drip.   Eyes: Negative for visual disturbance.  Respiratory: Positive for cough. Negative for choking and shortness of breath.   Cardiovascular: Negative for chest pain and leg swelling.  Gastrointestinal: Negative for nausea, vomiting and abdominal pain.  Genitourinary: Negative for difficulty urinating.  Musculoskeletal: Positive for arthralgias.  Skin: Negative for rash.  Psychiatric/Behavioral: Negative for behavioral problems and confusion.       Objective:   Physical Exam Wt Readings from Last 3 Encounters:  08/19/12 217 lb (98.431 kg)  08/04/12 211 lb (95.709 kg)  03/03/12 218 lb  6.4 oz (99.066 kg)     HEENT: nl dentition, turbinates, and orophanx. Nl external ear canals without cough reflex   NECK :  without JVD/Nodes/TM/ nl carotid upstrokes bilaterally   LUNGS: no acc muscle use, clear to A and P bilaterally without cough on insp or exp maneuvers   CV:  RRR  no s3 or murmur or increase in P2, no edema   ABD:  soft and nontender with nl excursion in the supine position. No bruits or organomegaly, bowel sounds nl  MS:  warm without deformities, calf tenderness, cyanosis or clubbing  SKIN: warm and dry without lesions    NEURO:  alert, approp, no deficits    CXR 08/04/12 1. No radiographic evidence of acute cardiopulmonary disease.  2. Atherosclerosis.        Assessment & Plan:

## 2012-08-22 NOTE — Assessment & Plan Note (Signed)
The most common causes of chronic cough in immunocompetent adults include the following: upper airway cough syndrome (UACS), previously referred to as postnasal drip syndrome (PNDS), which is caused by variety of rhinosinus conditions; (2) asthma; (3) GERD; (4) chronic bronchitis from cigarette smoking or other inhaled environmental irritants; (5) nonasthmatic eosinophilic bronchitis; and (6) bronchiectasis.   These conditions, singly or in combination, have accounted for up to 94% of the causes of chronic cough in prospective studies.   Other conditions have constituted no >6% of the causes in prospective studies These have included bronchogenic carcinoma, chronic interstitial pneumonia, sarcoidosis, left ventricular failure, ACEI-induced cough, and aspiration from a condition associated with pharyngeal dysfunction.    Chronic cough is often simultaneously caused by more than one condition. A single cause has been found from 38 to 82% of the time, multiple causes from 18 to 62%. Multiply caused cough has been the result of three diseases up to 42% of the time.       Of the three most common causes of chronic cough, only one (GERD)  can actually cause the other two (asthma and post nasal drip syndrome)  and perpetuate the cylce of cough inducing airway trauma, inflammation, heightened sensitivity to reflux which is prompted by the cough itself via a cyclical mechanism.    This may partially respond to steroids and look like asthma and post nasal drainage but never erradicated completely unless the cough and the secondary reflux are eliminated, preferably both at the same time.  While not intuitively obvious, many patients with chronic low grade reflux do not cough until there is a secondary insult that disturbs the protective epithelial barrier and exposes sensitive nerve endings.  This can be viral or direct physical injury such as with an endotracheal tube.   The point is that once this occurs, it is  difficult to eliminate using anything but a maximally effective acid suppression regimen at least in the short run, accompanied by an appropriate diet to address non acid GERD.   See instructions for specific recommendations which were reviewed directly with the patient who was given a copy with highlighter outlining the key components. If not better p 2 weeks of max acid suppression and diet needs to return to start formal cough algorithm protocol.

## 2012-08-24 ENCOUNTER — Encounter: Payer: Self-pay | Admitting: Emergency Medicine

## 2012-10-07 ENCOUNTER — Encounter: Payer: Self-pay | Admitting: Internal Medicine

## 2012-11-18 ENCOUNTER — Other Ambulatory Visit: Payer: Self-pay

## 2012-11-22 ENCOUNTER — Other Ambulatory Visit (HOSPITAL_BASED_OUTPATIENT_CLINIC_OR_DEPARTMENT_OTHER): Payer: Medicare PPO | Admitting: Lab

## 2012-11-22 DIAGNOSIS — C9110 Chronic lymphocytic leukemia of B-cell type not having achieved remission: Secondary | ICD-10-CM

## 2012-11-22 DIAGNOSIS — Z856 Personal history of leukemia: Secondary | ICD-10-CM

## 2012-11-22 LAB — CBC WITH DIFFERENTIAL/PLATELET
BASO%: 0.3 % (ref 0.0–2.0)
EOS%: 0.7 % (ref 0.0–7.0)
HCT: 49.2 % (ref 38.4–49.9)
LYMPH%: 87.2 % — ABNORMAL HIGH (ref 14.0–49.0)
MCHC: 33.6 g/dL (ref 32.0–36.0)
MONO#: 0.7 10*3/uL (ref 0.1–0.9)
Platelets: 152 10*3/uL (ref 140–400)
RBC: 5.42 10*6/uL (ref 4.20–5.82)
WBC: 39.5 10*3/uL — ABNORMAL HIGH (ref 4.0–10.3)
lymph#: 34.4 10*3/uL — ABNORMAL HIGH (ref 0.9–3.3)

## 2012-11-22 LAB — TECHNOLOGIST REVIEW

## 2012-11-23 LAB — IGG, IGA, IGM
IgG (Immunoglobin G), Serum: 932 mg/dL (ref 650–1600)
IgM, Serum: 16 mg/dL — ABNORMAL LOW (ref 41–251)

## 2012-11-29 ENCOUNTER — Ambulatory Visit (HOSPITAL_BASED_OUTPATIENT_CLINIC_OR_DEPARTMENT_OTHER): Payer: Medicare PPO | Admitting: Oncology

## 2012-11-29 ENCOUNTER — Encounter: Payer: Self-pay | Admitting: Oncology

## 2012-11-29 ENCOUNTER — Telehealth: Payer: Self-pay | Admitting: Oncology

## 2012-11-29 VITALS — BP 154/88 | HR 57 | Temp 97.5°F | Resp 18 | Ht 71.5 in | Wt 217.2 lb

## 2012-11-29 DIAGNOSIS — Z23 Encounter for immunization: Secondary | ICD-10-CM

## 2012-11-29 DIAGNOSIS — M25569 Pain in unspecified knee: Secondary | ICD-10-CM

## 2012-11-29 DIAGNOSIS — C9110 Chronic lymphocytic leukemia of B-cell type not having achieved remission: Secondary | ICD-10-CM

## 2012-11-29 MED ORDER — INFLUENZA VAC SPLIT QUAD 0.5 ML IM SUSP
0.5000 mL | Freq: Once | INTRAMUSCULAR | Status: AC
  Administered 2012-11-29: 0.5 mL via INTRAMUSCULAR
  Filled 2012-11-29: qty 0.5

## 2012-11-29 NOTE — Progress Notes (Signed)
ID: Francisco Larson   DOB: 06-Apr-1935  MR#: [id number]  JYN#:[id number]  PCP: Earl Lites MD SU: Ovidio Kin MD OTHER MD: Verdis Prime, Janalyn Harder    INTERVAL HISTORY: Francisco Larson  returns today for followup of his chronic lymphocytic leukemia. The interval history is generally unremarkable. He and his wife traveled to that Depakote is in to the laboratory plan she'll. His youngest grandchild is now a sophomore at [employer].  REVIEW OF SYSTEMS: He has not had unexplained fatigue, unexplained weight loss, drenching sweats, fever, disfiguring adenopathy, or unusual infections. He does have significant right knee problems and this limits his walking. Accordingly he is swimming 2-4 days a week and also doing some stationary bicycling. He is beginning to have some pain in his right thigh area and I think perhaps a second opinion regarding the need might be a good idea. Otherwise, aside from mild hearing issues, a detailed review of systems today was noncontributory  PAST MEDICAL HISTORY: Past Medical History  Diagnosis Date  . Hyperlipidemia   . Vitamin D deficiency   . Leukemia, chronic lymphocytic   . Shingles     PAST SURGICAL HISTORY: Past Surgical History  Procedure Laterality Date  . Surgery of lip      tumor  . Tonsillectomy      FAMILY HISTORY Family History  Problem Relation Age of Onset  . Colon cancer Sister   . Heart disease Father   . Emphysema Mother     never smoker, but exposed to 2nd hand from spouse  . Asthma Sister   . COPD Maternal Grandmother     never smoker   The patient's mother died at the age of 50. The patient's father died at the age of 31 from heart disease. He had no brothers. His only sister, currently 46 years old, has a history of late breast cancer and late middle-aged colon cancer.  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 His been married 50+ years, and their children  are [place] lives in [place] and has 4 children, and Dawn who lives in [place] and has no children.  Francisco Larson is an elder at [employer].  ADVANCED DIRECTIVES: in place  HEALTH MAINTENANCE: History  Substance Use Topics  . Smoking status: Never Smoker   . Smokeless tobacco: Never Used  . Alcohol Use: No     Colonoscopy:  PSA:  Bone density:  Lipid panel:  Allergies  Allergen Reactions  . Z-Pak [Azithromycin]     Current Outpatient Prescriptions  Medication Sig Dispense Refill  . cholecalciferol (VITAMIN D) 1000 UNITS tablet Take 1,000 Units by mouth daily.      . famotidine (PEPCID) 20 MG tablet One at bedtime  30 tablet  2  . pantoprazole (PROTONIX) 40 MG tablet Take 1 tablet (40 mg total) by mouth daily. Take 30-60 min before first meal of the day  30 tablet  2  . pravastatin (PRAVACHOL) 20 MG tablet Take 1 tablet (20 mg total) by mouth daily.  30 tablet  11  . vitamin B-12 (CYANOCOBALAMIN) 100 MCG tablet Take 50 mcg by mouth daily.       No current facility-administered medications for this visit.    OBJECTIVE: Middle-aged white male in no acute distress Filed Vitals:   11/29/12 0925  BP: 154/88  Pulse: 57  Temp: 97.5 F (36.4 C)  Resp: 18     Body mass index is 29.87  kg/(m^2).    ECOG FS: 0  Sclerae unicteric Oropharynx clear No cervical or supraclavicular adenopathy, no axillary adenopathy Lungs no rales or rhonchi Heart regular rate and rhythm Abd benign; no inguinal adenopathy; no axillary adenopathy MSK no focal spinal tenderness, no peripheral edema Neuro: nonfocal, well oriented, pleasant affect    LAB:  Results for TAMER, BAUGHMAN (MRN [id number]) as of 11/29/2012 09:28  Ref. Range 10/31/2008 08:09 07/03/2009 08:02 05/08/2011 08:18 11/20/2011 07:59 05/26/2012 08:04  Beta-2 Microglobulin Latest Range: 1.01-1.73 mg/L 2.45 (H) 3.46 (H) 2.64 (H) 2.93 (H) 2.77 (H)    Ref. Range 10/31/2010 08:30 05/08/2011 08:18 11/20/2011 07:59 05/26/2012 08:04  11/22/2012 08:30  lymph# Latest Range: 0.9-3.3 10e3/uL 28.1 (H) 29.1 (H) 34.4 (H) 33.0 (H) 34.4 (H)   Lab Results  Component Value Date   WBC 39.5* 11/22/2012   NEUTROABS 3.9 11/22/2012   HGB 16.5 11/22/2012   HCT 49.2 11/22/2012   MCV 90.7 11/22/2012   PLT 152 11/22/2012      Chemistry      Component Value Date/Time   NA 139 05/26/2012 0804   NA 138 01/27/2012 0847   K 4.4 05/26/2012 0804   K 4.6 01/27/2012 0847   CL 108* 05/26/2012 0804   CL 103 01/27/2012 0847   CO2 23 05/26/2012 0804   CO2 28 01/27/2012 0847   BUN 13.2 05/26/2012 0804   BUN 19 01/27/2012 0847   CREATININE 1.1 05/26/2012 0804   CREATININE 1.04 01/27/2012 0847   CREATININE 1.04 05/08/2011 0818      Component Value Date/Time   CALCIUM 8.6 05/26/2012 0804   CALCIUM 9.2 01/27/2012 0847   ALKPHOS 68 05/26/2012 0804   ALKPHOS 68 01/27/2012 0847   AST 21 05/26/2012 0804   AST 22 01/27/2012 0847   ALT 19 05/26/2012 0804   ALT 20 01/27/2012 0847   BILITOT 0.66 05/26/2012 0804   BILITOT 0.6 01/27/2012 0847       No results found for this basename: LABCA2    No components found with this basename: LABCA125    No results found for this basename: INR,  in the last 168 hours  Urinalysis No results found for this basename: colorurine,  appearanceur,  labspec,  phurine,  glucoseu,  hgbur,  bilirubinur,  ketonesur,  proteinur,  urobilinogen,  nitrite,  leukocytesur    STUDIES: Chest x-ray 08/04/2012 was unremarkable   ASSESSMENT: 77 y.o. 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   PLAN: Francisco Larson is doing very well from a chronic lymphoid leukemia point of view, with absolutely no progression as compared to a year ago. He has no "B." symptoms. No intervention is required.  I thought perhaps he might want a second opinion regarding his right knee and  suggested a sports physician he might consider. Otherwise I am going to continue to see him on a yearly schedule, with labs every 6 months. He knows to call for any problems that may develop before his next visit here.  Incidentally he requested to have his flu shot today which we were glad to provide.  Kristee Angus C    11/29/2012

## 2013-01-08 ENCOUNTER — Other Ambulatory Visit: Payer: Self-pay | Admitting: Emergency Medicine

## 2013-02-07 ENCOUNTER — Other Ambulatory Visit: Payer: Self-pay | Admitting: Physician Assistant

## 2013-02-08 ENCOUNTER — Encounter: Payer: Medicare PPO | Admitting: Emergency Medicine

## 2013-02-22 ENCOUNTER — Ambulatory Visit (INDEPENDENT_AMBULATORY_CARE_PROVIDER_SITE_OTHER): Payer: Medicare PPO | Admitting: Emergency Medicine

## 2013-02-22 ENCOUNTER — Encounter: Payer: Self-pay | Admitting: Emergency Medicine

## 2013-02-22 VITALS — BP 158/94 | HR 67 | Temp 97.8°F | Resp 16 | Ht 71.0 in | Wt 214.8 lb

## 2013-02-22 DIAGNOSIS — K219 Gastro-esophageal reflux disease without esophagitis: Secondary | ICD-10-CM

## 2013-02-22 DIAGNOSIS — C9110 Chronic lymphocytic leukemia of B-cell type not having achieved remission: Secondary | ICD-10-CM

## 2013-02-22 DIAGNOSIS — M25569 Pain in unspecified knee: Secondary | ICD-10-CM

## 2013-02-22 DIAGNOSIS — N4289 Other specified disorders of prostate: Secondary | ICD-10-CM

## 2013-02-22 DIAGNOSIS — Z0000 Encounter for general adult medical examination without abnormal findings: Secondary | ICD-10-CM

## 2013-02-22 DIAGNOSIS — R05 Cough: Secondary | ICD-10-CM

## 2013-02-22 DIAGNOSIS — Z125 Encounter for screening for malignant neoplasm of prostate: Secondary | ICD-10-CM

## 2013-02-22 DIAGNOSIS — E782 Mixed hyperlipidemia: Secondary | ICD-10-CM

## 2013-02-22 DIAGNOSIS — R059 Cough, unspecified: Secondary | ICD-10-CM

## 2013-02-22 LAB — LIPID PANEL
Cholesterol: 191 mg/dL (ref 0–200)
HDL: 28 mg/dL — ABNORMAL LOW
LDL Cholesterol: 126 mg/dL — ABNORMAL HIGH (ref 0–99)
Total CHOL/HDL Ratio: 6.8 ratio
Triglycerides: 187 mg/dL — ABNORMAL HIGH
VLDL: 37 mg/dL (ref 0–40)

## 2013-02-22 LAB — COMPLETE METABOLIC PANEL WITHOUT GFR
ALT: 18 U/L (ref 0–53)
AST: 21 U/L (ref 0–37)
Albumin: 3.5 g/dL (ref 3.5–5.2)
Alkaline Phosphatase: 64 U/L (ref 39–117)
BUN: 16 mg/dL (ref 6–23)
CO2: 30 meq/L (ref 19–32)
Calcium: 8.7 mg/dL (ref 8.4–10.5)
Chloride: 102 meq/L (ref 96–112)
Creat: 1.1 mg/dL (ref 0.50–1.35)
GFR, Est African American: 76 mL/min
GFR, Est Non African American: 65 mL/min
Glucose, Bld: 103 mg/dL — ABNORMAL HIGH (ref 70–99)
Potassium: 4.5 meq/L (ref 3.5–5.3)
Sodium: 137 meq/L (ref 135–145)
Total Bilirubin: 0.7 mg/dL (ref 0.2–1.2)
Total Protein: 6 g/dL (ref 6.0–8.3)

## 2013-02-22 LAB — POCT URINALYSIS DIPSTICK
BILIRUBIN UA: NEGATIVE
Blood, UA: NEGATIVE
Glucose, UA: NEGATIVE
KETONES UA: 15
Nitrite, UA: NEGATIVE
PH UA: 5.5
PROTEIN UA: NEGATIVE
Urobilinogen, UA: 0.2

## 2013-02-22 LAB — IFOBT (OCCULT BLOOD): IFOBT: NEGATIVE

## 2013-02-22 MED ORDER — PRAVASTATIN SODIUM 20 MG PO TABS: 20.0000 mg | ORAL_TABLET | Freq: Every day | ORAL | Status: DC

## 2013-02-22 MED ORDER — PANTOPRAZOLE SODIUM 40 MG PO TBEC: 40.0000 mg | DELAYED_RELEASE_TABLET | Freq: Every day | ORAL | Status: DC

## 2013-02-22 NOTE — Progress Notes (Addendum)
Subjective:    Patient ID: Francisco Larson, male    DOB: June 19, 1935, 78 y.o.   MRN: [id number] This chart was scribed for Francisco Russian, MD by Anastasia Pall, ED Scribe. This patient was seen in room 23 and the patient's care was started at 4:21 PM.  Chief Complaint  Patient presents with  . Annual Exam   HPI Francisco Larson is a 78 y.o. male Pt presents for an annual exam.   He states he has been doing well. He has planned trip to tour [place].   Pt has h/o chronic lymphocytic leukemia, diagnosed in 2001. He states he finished chemotherapy in 2003-4, and was steady for a while. He reports in the last 5 years his WBC has been slowly elevating.   He reports intermittent, bilateral knee pain, from h/o arthralgias and h/o ACL injury in right knee. He reports intermittent numbness in his bilateral feet over the balls of his feet and heels, onset several years ago. He denies any pain to his feet.   He requests refill of his Pravastatin.   PCP - Jenny Reichmann, MD  Patient Active Problem List   Diagnosis Date Noted  . Cough 01/27/2012  . Knee joint pain 01/27/2012  . Hyperlipidemia 01/27/2012  . CLL (chronic lymphocytic leukemia) 11/27/2011   Past Medical History  Diagnosis Date  . Hyperlipidemia   . Vitamin D deficiency   . Leukemia, chronic lymphocytic   . Shingles   . Arthritis    Past Surgical History  Procedure Laterality Date  . Surgery of lip      tumor  . Tonsillectomy    . Eye surgery    . Vasectomy    . Laparoscopic trans anal and transabdominal rectal resection with coloanal anastomosis     Allergies  Allergen Reactions  . Z-Pak [Azithromycin]    Prior to Admission medications   Medication Sig Start Date End Date Taking? Authorizing Provider  cholecalciferol (VITAMIN D) 1000 UNITS tablet Take 1,000 Units by mouth daily.   Yes Historical Provider, MD  famotidine (PEPCID) 20 MG tablet One at bedtime 08/19/12  Yes Tanda Rockers, MD  pantoprazole  (PROTONIX) 40 MG tablet Take 1 tablet (40 mg total) by mouth daily. Take 30-60 min before first meal of the day 08/19/12  Yes Tanda Rockers, MD  pravastatin (PRAVACHOL) 20 MG tablet Take 1 tablet (20 mg total) by mouth daily. 02/07/13  Yes Francisco Russian, MD  vitamin B-12 (CYANOCOBALAMIN) 100 MCG tablet Take 50 mcg by mouth daily.   Yes Historical Provider, MD   Review of Systems  Musculoskeletal: Positive for arthralgias (intermittent pain in bilateral knees).  Neurological: Positive for numbness (intermittent numbness in bilateral feet, over balls and heels).  All other systems reviewed and are negative.      Objective:   Physical Exam Nursing note and vitals reviewed. Constitutional: Pt is oriented to person, place, and time. Pt appears well-developed and well-nourished. No distress.  HENT: Right TM slight cerumen impaction. Left TM nl. Oropharynx clear and moist, no exudate. Nose nl.  Head: Normocephalic and atraumatic.  Eyes: EOM are normal.  Neck: Neck supple. No thyromegaly. No cervical adenopathy.  Cardiovascular: Normal rate, regular rhythm and normal heart sounds.  Exam reveals no gallop and no friction rub. No murmur heard. Pulmonary/Chest: Effort normal and breath sounds normal. No respiratory distress. Pt has no wheezes. Pt has no rales.  Abdominal: Soft. Bowel sounds are normal. There is no tenderness.  There is no rebound and no guarding. No hepatosplenomegaly. No CVA tenderness.  Musculoskeletal: Normal range of motion. No pitting edema.   GU: On rectal examination the lateral portions of the prostate on both sides is somewhat firm. There is no definite nodularity.  Neurological: Pt is alert and oriented to person, place, and time. Sensation bilateral LE nl.  Skin: Skin is warm and dry.  Psychiatric: Pt has a normal mood and affect. Pt's behavior is normal.   Pulse 67  Temp(Src) 97.8 F (36.6 C) (Oral)  Resp 16  Ht 5\' 11"  (1.803 m)  Wt 214 lb 12.8 oz (97.433 kg)  BMI  29.97 kg/m2  SpO2 98% Results for orders placed in visit on 02/22/13  POCT URINALYSIS DIPSTICK      Result Value Range   Color, UA yellow     Clarity, UA clear     Glucose, UA neg     Bilirubin, UA neg     Ketones, UA 15     Spec Grav, UA >=1.030     Blood, UA neg     pH, UA 5.5     Protein, UA neg     Urobilinogen, UA 0.2     Nitrite, UA neg     Leukocytes, UA Trace    IFOBT (OCCULT BLOOD)      Result Value Range   IFOBT Negative        Assessment & Plan:   Pravachol and his PPI were both refilled. He is due for another colonoscopy in one to 2 years. We'll await the PSA prior to making a referral to the urologist. He has a followup appointment with the oncologist regarding his CLL in the near future to    I personally performed the services described in this documentation, which was scribed in my presence. The recorded information has been reviewed and is accurate.

## 2013-02-23 ENCOUNTER — Other Ambulatory Visit: Payer: Self-pay | Admitting: Emergency Medicine

## 2013-02-23 ENCOUNTER — Telehealth: Payer: Self-pay | Admitting: Emergency Medicine

## 2013-02-23 DIAGNOSIS — N4289 Other specified disorders of prostate: Secondary | ICD-10-CM

## 2013-02-23 LAB — CBC WITH DIFFERENTIAL/PLATELET
Basophils Absolute: 0.1 10*3/uL (ref 0.0–0.1)
Basophils Relative: 0 % (ref 0–1)
EOS PCT: 1 % (ref 0–5)
Eosinophils Absolute: 0.3 10*3/uL (ref 0.0–0.7)
HEMATOCRIT: 48.9 % (ref 39.0–52.0)
HEMOGLOBIN: 16.6 g/dL (ref 13.0–17.0)
LYMPHS ABS: 32.8 10*3/uL — AB (ref 0.7–4.0)
Lymphocytes Relative: 88 % — ABNORMAL HIGH (ref 12–46)
MCH: 29.8 pg (ref 26.0–34.0)
MCHC: 33.9 g/dL (ref 30.0–36.0)
MCV: 87.8 fL (ref 78.0–100.0)
MONOS PCT: 2 % — AB (ref 3–12)
Monocytes Absolute: 0.9 10*3/uL (ref 0.1–1.0)
NEUTROS ABS: 3.5 10*3/uL (ref 1.7–7.7)
Neutrophils Relative %: 9 % — ABNORMAL LOW (ref 43–77)
Platelets: 180 10*3/uL (ref 150–400)
RBC: 5.57 MIL/uL (ref 4.22–5.81)
RDW: 14.1 % (ref 11.5–15.5)
WBC: 37.5 10*3/uL — AB (ref 4.0–10.5)

## 2013-02-23 LAB — PSA, MEDICARE: PSA: 2.12 ng/mL (ref <=4.00)

## 2013-02-23 LAB — PATHOLOGIST SMEAR REVIEW

## 2013-02-23 NOTE — Telephone Encounter (Signed)
LMOM to CB. 

## 2013-02-23 NOTE — Progress Notes (Signed)
Quick Note:  Preliminary report reviewed by triage nurse and sent to MD desk. ______ 

## 2013-02-23 NOTE — Addendum Note (Signed)
Addended by: Arlyss Queen A on: 02/23/2013 07:42 AM   Modules accepted: Orders

## 2013-02-23 NOTE — Telephone Encounter (Signed)
Pt.notified

## 2013-02-23 NOTE — Telephone Encounter (Signed)
PT CALLED BACK REGARDING LABS, WASN'T ABLE TO REACH ANYONE. PLEASE CALL PT AT [phone]

## 2013-02-23 NOTE — Telephone Encounter (Signed)
Please call patient and let him know his PSA is normal. It has not changed from last year. I still would like him to see the urologist maybe in May when he returns from his vacation.

## 2013-02-24 ENCOUNTER — Encounter: Payer: Self-pay | Admitting: Family Medicine

## 2013-04-25 ENCOUNTER — Other Ambulatory Visit (HOSPITAL_BASED_OUTPATIENT_CLINIC_OR_DEPARTMENT_OTHER): Payer: Medicare PPO

## 2013-04-25 DIAGNOSIS — C9110 Chronic lymphocytic leukemia of B-cell type not having achieved remission: Secondary | ICD-10-CM

## 2013-04-25 LAB — CBC WITH DIFFERENTIAL/PLATELET
BASO%: 0.2 % (ref 0.0–2.0)
Basophils Absolute: 0.1 10*3/uL (ref 0.0–0.1)
EOS%: 1 % (ref 0.0–7.0)
Eosinophils Absolute: 0.4 10*3/uL (ref 0.0–0.5)
HEMATOCRIT: 50.5 % — AB (ref 38.4–49.9)
HGB: 16.6 g/dL (ref 13.0–17.1)
LYMPH%: 86.6 % — AB (ref 14.0–49.0)
MCH: 29.7 pg (ref 27.2–33.4)
MCHC: 32.9 g/dL (ref 32.0–36.0)
MCV: 90.1 fL (ref 79.3–98.0)
MONO#: 0.7 10*3/uL (ref 0.1–0.9)
MONO%: 2 % (ref 0.0–14.0)
NEUT#: 3.8 10*3/uL (ref 1.5–6.5)
NEUT%: 10.2 % — AB (ref 39.0–75.0)
PLATELETS: 158 10*3/uL (ref 140–400)
RBC: 5.61 10*6/uL (ref 4.20–5.82)
RDW: 14.1 % (ref 11.0–14.6)
WBC: 37.5 10*3/uL — AB (ref 4.0–10.3)
lymph#: 32.5 10*3/uL — ABNORMAL HIGH (ref 0.9–3.3)

## 2013-04-25 LAB — COMPREHENSIVE METABOLIC PANEL (CC13)
ALT: 20 U/L (ref 0–55)
ANION GAP: 7 meq/L (ref 3–11)
AST: 21 U/L (ref 5–34)
Albumin: 3.3 g/dL — ABNORMAL LOW (ref 3.5–5.0)
Alkaline Phosphatase: 80 U/L (ref 40–150)
BUN: 17.4 mg/dL (ref 7.0–26.0)
CALCIUM: 9 mg/dL (ref 8.4–10.4)
CHLORIDE: 107 meq/L (ref 98–109)
CO2: 26 meq/L (ref 22–29)
CREATININE: 1.1 mg/dL (ref 0.7–1.3)
GLUCOSE: 113 mg/dL (ref 70–140)
POTASSIUM: 4.4 meq/L (ref 3.5–5.1)
Sodium: 140 mEq/L (ref 136–145)
Total Bilirubin: 0.45 mg/dL (ref 0.20–1.20)
Total Protein: 6.3 g/dL — ABNORMAL LOW (ref 6.4–8.3)

## 2013-04-25 LAB — LACTATE DEHYDROGENASE (CC13): LDH: 180 U/L (ref 125–245)

## 2013-04-25 LAB — TECHNOLOGIST REVIEW

## 2013-04-27 LAB — IGG, IGA, IGM
IGG (IMMUNOGLOBIN G), SERUM: 829 mg/dL (ref 650–1600)
IgA: 129 mg/dL (ref 68–379)
IgM, Serum: 13 mg/dL — ABNORMAL LOW (ref 41–251)

## 2013-04-27 LAB — BETA 2 MICROGLOBULIN, SERUM: Beta-2 Microglobulin: 4.22 mg/L — ABNORMAL HIGH (ref <=2.51)

## 2013-05-02 ENCOUNTER — Other Ambulatory Visit: Payer: Self-pay | Admitting: Oncology

## 2013-05-02 DIAGNOSIS — C9110 Chronic lymphocytic leukemia of B-cell type not having achieved remission: Secondary | ICD-10-CM

## 2013-05-03 ENCOUNTER — Telehealth: Payer: Self-pay | Admitting: Oncology

## 2013-05-03 NOTE — Telephone Encounter (Signed)
lvm for pt regarding to July appt....mailed pt appt sched/avs and letter °

## 2013-07-27 ENCOUNTER — Other Ambulatory Visit (HOSPITAL_BASED_OUTPATIENT_CLINIC_OR_DEPARTMENT_OTHER): Payer: Medicare PPO

## 2013-07-27 DIAGNOSIS — C9110 Chronic lymphocytic leukemia of B-cell type not having achieved remission: Secondary | ICD-10-CM

## 2013-07-27 LAB — TECHNOLOGIST REVIEW

## 2013-07-27 LAB — CBC WITH DIFFERENTIAL/PLATELET
BASO%: 0.2 % (ref 0.0–2.0)
Basophils Absolute: 0.1 10*3/uL (ref 0.0–0.1)
EOS ABS: 0.2 10*3/uL (ref 0.0–0.5)
EOS%: 0.4 % (ref 0.0–7.0)
HEMATOCRIT: 48.7 % (ref 38.4–49.9)
HEMOGLOBIN: 15.8 g/dL (ref 13.0–17.1)
LYMPH#: 35.2 10*3/uL — AB (ref 0.9–3.3)
LYMPH%: 87.9 % — ABNORMAL HIGH (ref 14.0–49.0)
MCH: 29.3 pg (ref 27.2–33.4)
MCHC: 32.5 g/dL (ref 32.0–36.0)
MCV: 90.2 fL (ref 79.3–98.0)
MONO#: 1 10*3/uL — AB (ref 0.1–0.9)
MONO%: 2.4 % (ref 0.0–14.0)
NEUT%: 9.1 % — AB (ref 39.0–75.0)
NEUTROS ABS: 3.7 10*3/uL (ref 1.5–6.5)
PLATELETS: 177 10*3/uL (ref 140–400)
RBC: 5.4 10*6/uL (ref 4.20–5.82)
RDW: 14.2 % (ref 11.0–14.6)
WBC: 40.1 10*3/uL — AB (ref 4.0–10.3)

## 2013-07-27 LAB — COMPREHENSIVE METABOLIC PANEL (CC13)
ALT: 20 U/L (ref 0–55)
AST: 22 U/L (ref 5–34)
Albumin: 3.2 g/dL — ABNORMAL LOW (ref 3.5–5.0)
Alkaline Phosphatase: 72 U/L (ref 40–150)
Anion Gap: 7 mEq/L (ref 3–11)
BUN: 16.1 mg/dL (ref 7.0–26.0)
CALCIUM: 8.7 mg/dL (ref 8.4–10.4)
CO2: 24 mEq/L (ref 22–29)
CREATININE: 1 mg/dL (ref 0.7–1.3)
Chloride: 108 mEq/L (ref 98–109)
GLUCOSE: 109 mg/dL (ref 70–140)
Potassium: 4.2 mEq/L (ref 3.5–5.1)
Sodium: 139 mEq/L (ref 136–145)
Total Bilirubin: 0.45 mg/dL (ref 0.20–1.20)
Total Protein: 6 g/dL — ABNORMAL LOW (ref 6.4–8.3)

## 2013-07-27 LAB — LACTATE DEHYDROGENASE (CC13): LDH: 172 U/L (ref 125–245)

## 2013-07-29 LAB — BETA 2 MICROGLOBULIN, SERUM: Beta-2 Microglobulin: 3.95 mg/L — ABNORMAL HIGH (ref <=2.51)

## 2013-08-11 ENCOUNTER — Telehealth: Payer: Self-pay | Admitting: Oncology

## 2013-08-11 NOTE — Telephone Encounter (Signed)
pt cld to r/s lab appt-r/s and adv pt of time & date

## 2013-10-24 ENCOUNTER — Other Ambulatory Visit: Payer: Medicare PPO

## 2013-10-25 ENCOUNTER — Other Ambulatory Visit (HOSPITAL_BASED_OUTPATIENT_CLINIC_OR_DEPARTMENT_OTHER): Payer: Medicare PPO

## 2013-10-25 DIAGNOSIS — C9110 Chronic lymphocytic leukemia of B-cell type not having achieved remission: Secondary | ICD-10-CM

## 2013-10-25 LAB — COMPREHENSIVE METABOLIC PANEL (CC13)
ALT: 22 U/L (ref 0–55)
ANION GAP: 7 meq/L (ref 3–11)
AST: 22 U/L (ref 5–34)
Albumin: 3.1 g/dL — ABNORMAL LOW (ref 3.5–5.0)
Alkaline Phosphatase: 69 U/L (ref 40–150)
BUN: 16.7 mg/dL (ref 7.0–26.0)
CALCIUM: 8.9 mg/dL (ref 8.4–10.4)
CHLORIDE: 110 meq/L — AB (ref 98–109)
CO2: 21 meq/L — AB (ref 22–29)
Creatinine: 1.1 mg/dL (ref 0.7–1.3)
Glucose: 128 mg/dl (ref 70–140)
Potassium: 4.3 mEq/L (ref 3.5–5.1)
SODIUM: 139 meq/L (ref 136–145)
TOTAL PROTEIN: 6.1 g/dL — AB (ref 6.4–8.3)
Total Bilirubin: 0.61 mg/dL (ref 0.20–1.20)

## 2013-10-25 LAB — LACTATE DEHYDROGENASE (CC13): LDH: 183 U/L (ref 125–245)

## 2013-10-25 LAB — CBC WITH DIFFERENTIAL/PLATELET
BASO%: 0.2 % (ref 0.0–2.0)
Basophils Absolute: 0.1 10*3/uL (ref 0.0–0.1)
EOS%: 0.6 % (ref 0.0–7.0)
Eosinophils Absolute: 0.2 10*3/uL (ref 0.0–0.5)
HEMATOCRIT: 48.2 % (ref 38.4–49.9)
HGB: 15.5 g/dL (ref 13.0–17.1)
LYMPH%: 88.7 % — AB (ref 14.0–49.0)
MCH: 29.3 pg (ref 27.2–33.4)
MCHC: 32.2 g/dL (ref 32.0–36.0)
MCV: 90.8 fL (ref 79.3–98.0)
MONO#: 0.7 10*3/uL (ref 0.1–0.9)
MONO%: 1.8 % (ref 0.0–14.0)
NEUT#: 3.2 10*3/uL (ref 1.5–6.5)
NEUT%: 8.7 % — AB (ref 39.0–75.0)
PLATELETS: 157 10*3/uL (ref 140–400)
RBC: 5.31 10*6/uL (ref 4.20–5.82)
RDW: 13.9 % (ref 11.0–14.6)
WBC: 37.1 10*3/uL — ABNORMAL HIGH (ref 4.0–10.3)
lymph#: 33 10*3/uL — ABNORMAL HIGH (ref 0.9–3.3)

## 2013-10-25 LAB — TECHNOLOGIST REVIEW

## 2013-10-27 LAB — BETA 2 MICROGLOBULIN, SERUM: Beta-2 Microglobulin: 3.57 mg/L — ABNORMAL HIGH (ref <=2.51)

## 2013-10-27 LAB — IGG, IGA, IGM
IgA: 127 mg/dL (ref 68–379)
IgG (Immunoglobin G), Serum: 853 mg/dL (ref 650–1600)
IgM, Serum: 12 mg/dL — ABNORMAL LOW (ref 41–251)

## 2013-10-31 ENCOUNTER — Ambulatory Visit (HOSPITAL_BASED_OUTPATIENT_CLINIC_OR_DEPARTMENT_OTHER): Payer: Medicare PPO | Admitting: Oncology

## 2013-10-31 VITALS — BP 139/50 | HR 60 | Temp 97.5°F | Resp 18 | Ht 71.0 in | Wt 216.9 lb

## 2013-10-31 DIAGNOSIS — R5383 Other fatigue: Secondary | ICD-10-CM

## 2013-10-31 DIAGNOSIS — Z856 Personal history of leukemia: Secondary | ICD-10-CM

## 2013-10-31 DIAGNOSIS — C9110 Chronic lymphocytic leukemia of B-cell type not having achieved remission: Secondary | ICD-10-CM

## 2013-10-31 DIAGNOSIS — Z23 Encounter for immunization: Secondary | ICD-10-CM

## 2013-10-31 MED ORDER — INFLUENZA VAC SPLIT QUAD 0.5 ML IM SUSY
0.5000 mL | PREFILLED_SYRINGE | Freq: Once | INTRAMUSCULAR | Status: AC
  Administered 2013-10-31: 0.5 mL via INTRAMUSCULAR
  Filled 2013-10-31: qty 0.5

## 2013-10-31 NOTE — Progress Notes (Signed)
ID: Francisco Larson   DOB: Jan 05, 1936  MR#: [id number]  WTU#:[id number]  PCP: Nena Jordan MD SU: Alphonsa Overall MD OTHER MD: Daneen Schick, [name]    INTERVAL HISTORY: Francisco Larson  returns today for followup of his chronic lymphocytic leukemia. In general, he feels well. The only problem he reports is that perhaps once a week, or 3 or 4 times a month, he will wake up feeling tired. That day he does not do much. The next day he will be feeling fine. There are no particular stresses in his life, he is not depressed, and he exercises regularly, going to the gym 3-4 times a week and playing golf once or twice a week on the other days.  REVIEW OF SYSTEMS: He denies fevers, rash, pruritus, drenching sweats, adenopathy, or unexplained weight loss. In fact his weight is totally stable. He does have some foot cramps at times, and occasionally has a slight urinary dribbling. He still has numbness over his feet and occasionally his nighttime cramps. A detailed review of systems today was otherwise entirely stable  PAST MEDICAL HISTORY: Past Medical History  Diagnosis Date  . Hyperlipidemia   . Vitamin D deficiency   . Leukemia, chronic lymphocytic   . Shingles   . Arthritis     PAST SURGICAL HISTORY: Past Surgical History  Procedure Laterality Date  . Surgery of lip      tumor  . Tonsillectomy    . Eye surgery    . Vasectomy    . Laparoscopic trans anal and transabdominal rectal resection with coloanal anastomosis      FAMILY HISTORY Family History  Problem Relation Age of Onset  . Colon cancer Sister   . Heart disease Father   . Emphysema Mother     never smoker, but exposed to 2nd hand from spouse  . Asthma Sister   . COPD Maternal Grandmother     never smoker   The patient's mother died at the age of 46. The patient's father died at the age of 40 from heart disease. He had no brothers. His only sister, currently 83 years old, has a history of late breast cancer and late middle-aged  colon cancer.  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 His been married 50+ years, and their children are Francisco Larson lives in [place] and has 4 children, and Francisco Larson who lives in [place] and has no children.  Francisco Larson is an elder at [employer].  ADVANCED DIRECTIVES: in place  HEALTH MAINTENANCE: History  Substance Use Topics  . Smoking status: Never Smoker   . Smokeless tobacco: Never Used  . Alcohol Use: No     Colonoscopy:  PSA:  Bone density:  Lipid panel:  Allergies  Allergen Reactions  . Z-Pak [Azithromycin]     Current Outpatient Prescriptions  Medication Sig Dispense Refill  . cholecalciferol (VITAMIN D) 1000 UNITS tablet Take 1,000 Units by mouth daily.      . famotidine (PEPCID) 20 MG tablet One at bedtime  30 tablet  2  . pantoprazole (PROTONIX) 40 MG tablet Take 1 tablet (40 mg total) by mouth daily. Take 30-60 min before first meal of the day  90 tablet  3  . pravastatin (PRAVACHOL) 20 MG tablet Take 1 tablet (20 mg total) by mouth daily.  90 tablet  3  . vitamin B-12 (CYANOCOBALAMIN) 100 MCG tablet Take 50 mcg by mouth daily.  No current facility-administered medications for this visit.    OBJECTIVE: Middle-aged white man who appears well Filed Vitals:   10/31/13 1028  BP: 139/50  Pulse: 60  Temp: 97.5 F (36.4 C)  Resp: 18     Body mass index is 30.26 kg/(m^2).    ECOG FS: 0  Sclerae unicteric, pupils round and equal Oropharynx clear, teeth in good repair No cervical or supraclavicular adenopathy, no axillary adenopathy Lungs no rales or rhonchi Heart regular rate and rhythm Abd soft, nontender, positive bowel sounds; no inguinal adenopathy MSK no focal spinal tenderness, no peripheral edema Neuro: nonfocal, well oriented, pleasant affect    LAB: Results for Francisco, Larson (MRN [id number]) as of 10/31/2013 10:36  Ref. Range 05/26/2012 08:04  11/22/2012 08:30 04/25/2013 09:16 07/27/2013 09:37 10/25/2013 08:24  lymph# Latest Range: 0.9-3.3 10e3/uL 33.0 (H) 34.4 (H) 32.5 (H) 35.2 (H) 33.0 (H)   Results for Francisco, Larson (MRN [id number]) as of 10/31/2013 10:36  Ref. Range 11/20/2011 07:59 05/26/2012 08:04 04/25/2013 09:16 07/27/2013 09:38 10/25/2013 08:25  Beta-2 Microglobulin Latest Range: <=2.51 mg/L 2.93 (H) 2.77 (H) 4.22 (H) 3.95 (H) 3.57 (H)   Lab Results  Component Value Date   WBC 37.1* 10/25/2013   NEUTROABS 3.2 10/25/2013   HGB 15.5 10/25/2013   HCT 48.2 10/25/2013   MCV 90.8 10/25/2013   PLT 157 10/25/2013      Chemistry      Component Value Date/Time   NA 139 10/25/2013 0825   NA 137 02/22/2013 1626   K 4.3 10/25/2013 0825   K 4.5 02/22/2013 1626   CL 102 02/22/2013 1626   CL 108* 05/26/2012 0804   CO2 21* 10/25/2013 0825   CO2 30 02/22/2013 1626   BUN 16.7 10/25/2013 0825   BUN 16 02/22/2013 1626   CREATININE 1.1 10/25/2013 0825   CREATININE 1.10 02/22/2013 1626   CREATININE 1.04 05/08/2011 0818      Component Value Date/Time   CALCIUM 8.9 10/25/2013 0825   CALCIUM 8.7 02/22/2013 1626   ALKPHOS 69 10/25/2013 0825   ALKPHOS 64 02/22/2013 1626   AST 22 10/25/2013 0825   AST 21 02/22/2013 1626   ALT 22 10/25/2013 0825   ALT 18 02/22/2013 1626   BILITOT 0.61 10/25/2013 0825   BILITOT 0.7 02/22/2013 1626       No results found for this basename: LABCA2    No components found with this basename: LABCA125    No results found for this basename: INR,  in the last 168 hours  Urinalysis No results found for this basename: colorurine,  appearanceur,  labspec,  phurine,  glucoseu,  hgbur,  bilirubinur,  ketonesur,  proteinur,  urobilinogen,  nitrite,  leukocytesur    STUDIES: No results found.  ASSESSMENT: 78 y.o. 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   PLAN:  I spent approximately 40 minutes with the trying to figure out exactly what is going on these "fatigued". Certainly persistent severe fatigue can be a sign of chronic lymphoid leukemia activity. However all his other numbers are fine, he has had no fevers, drenching sweats, or any weight loss. This leads me to think there may be other reasons, but I am not getting any suggestion of cardiac causes: He is very active physically, exercises regularly, has had no swelling or other symptoms or signs suggestive of failure. We reviewed his  medications, which I don't think would be causing this.  His total lymphocyte count is actually down, as is his beta-2 microglobulin. His albumin is slightly low, but not significantly changed from prior.  We left it that he is going to write notes to see if we can figure out a pattern. If he wakes up feeling tired he is going to go back 24 hours and record 1 needed the day before, body aches, and how he slept the previous night. After he has date 04/17/2003 of these occurrences he can look them over for pattern. She comes up if anything of course he will let me know.  Otherwise she will continue to have lab work every 6 months and visits yearly. The only other thing I am doing is adding C. reactive protein and ESR to his next set of labs in case there is some hidden inflammatory component to this issue. Francisco Larson is doing very well from a chronic lymphoid leukemia point of view, with absolutely no progression as compared to a year ago. He has no "B." symptoms. No intervention is required.  He received his flu shot today. MAGRINAT,GUSTAV C    10/31/2013

## 2013-10-31 NOTE — Addendum Note (Signed)
Addended by: Renford Dills on: 10/31/2013 12:04 PM   Modules accepted: Orders

## 2014-01-24 ENCOUNTER — Other Ambulatory Visit: Payer: Self-pay | Admitting: Dermatology

## 2014-02-11 IMAGING — CR DG CHEST 2V
2 series · 2 of 2 positions shown · non-contrast
Comparison: Chest x-ray 01/27/2012.

CLINICAL DATA: Cough.  CLL.

CHEST - 2 VIEW

[PA]
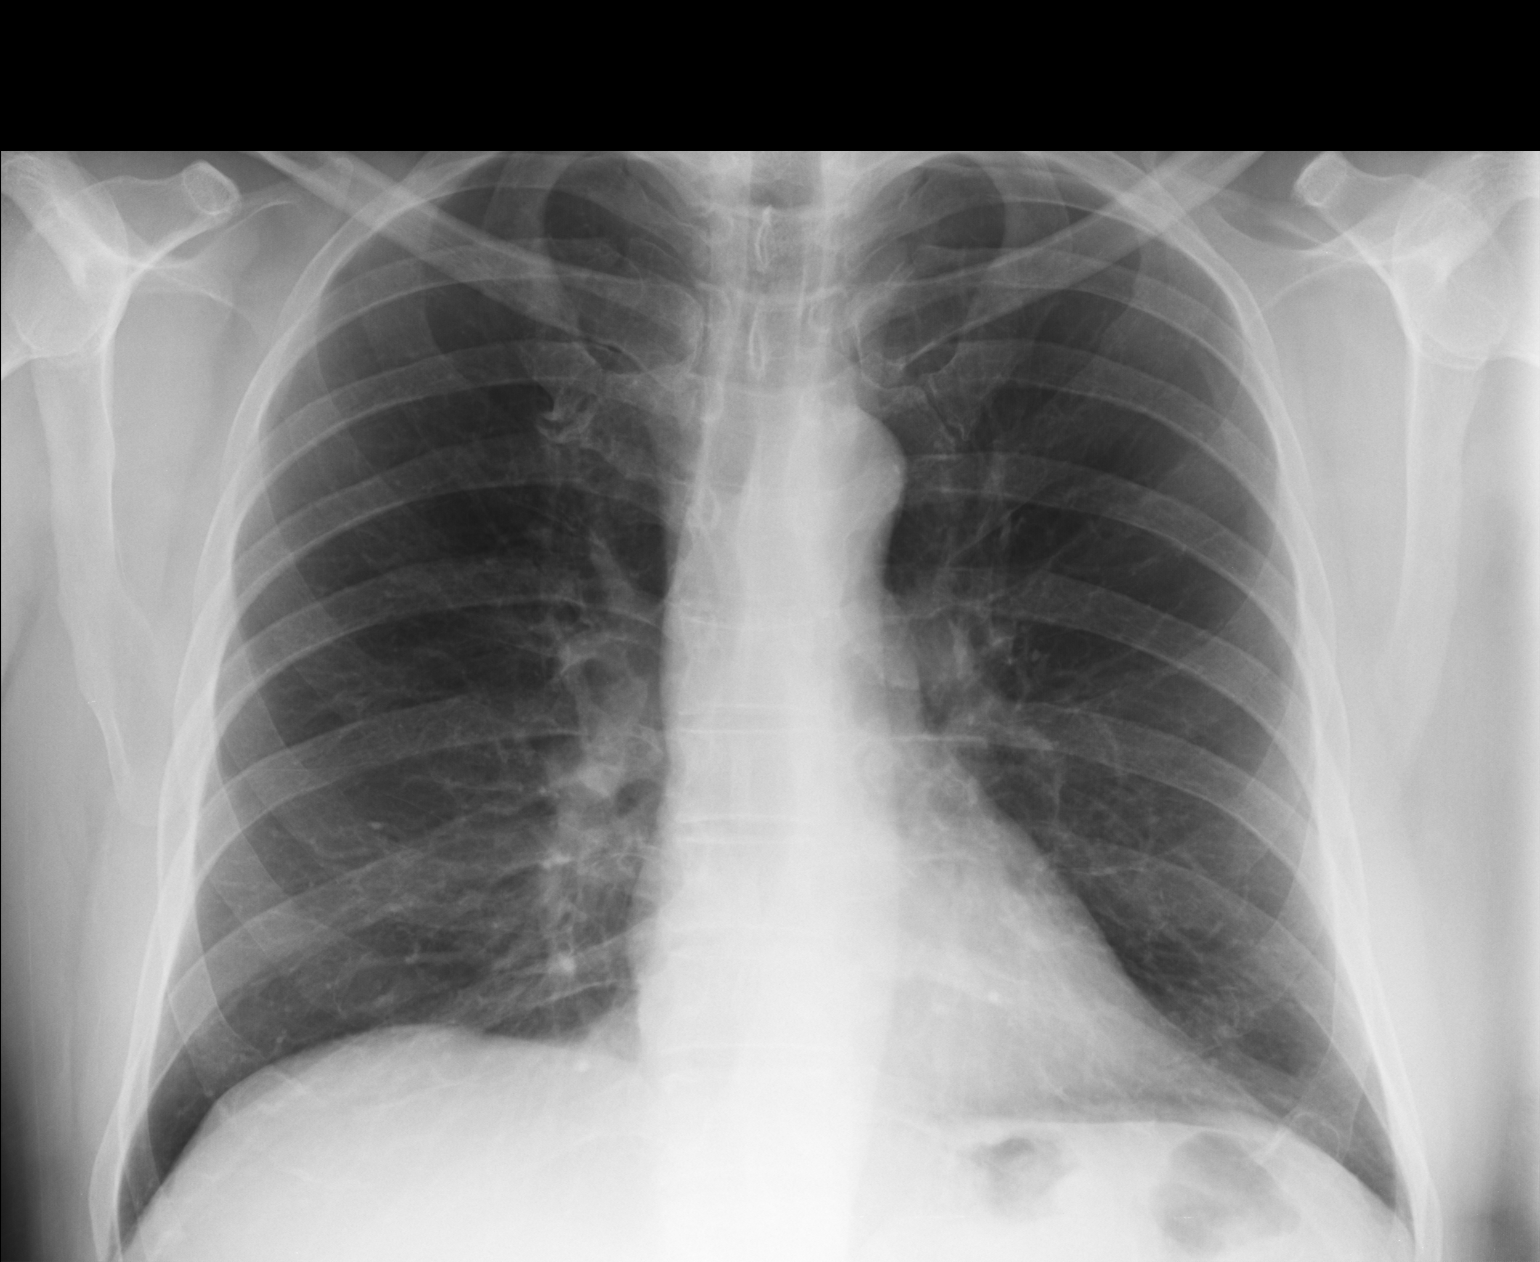

[lateral]
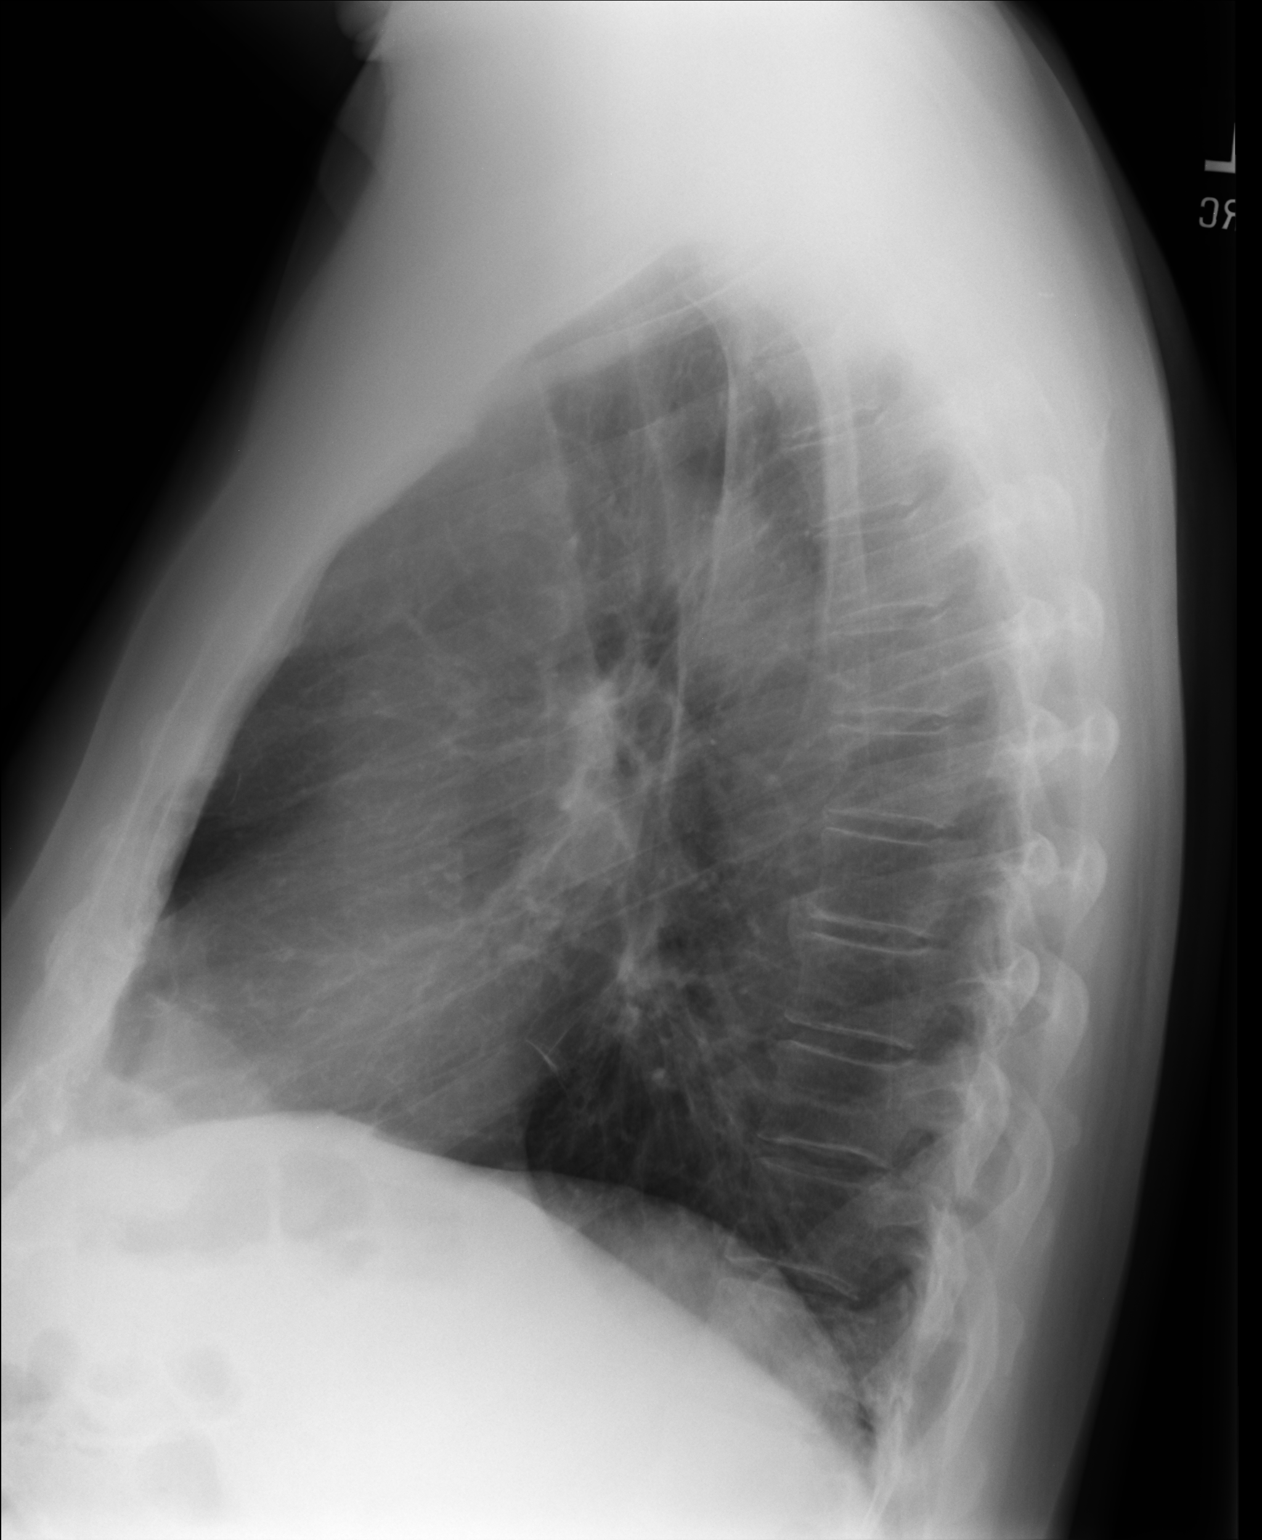

[2 of 2 positions shown; findings below may reference images not displayed]

FINDINGS: Lung volumes are normal.  No consolidative airspace
disease.  No pleural effusions.  No pneumothorax.  No pulmonary
nodule or mass noted.  Pulmonary vasculature and the
cardiomediastinal silhouette are within normal limits.
Atherosclerosis in the thoracic aorta.
IMPRESSION: 1. No radiographic evidence of acute cardiopulmonary disease.
2.  Atherosclerosis.

Clinically significant discrepancy from primary report, if
provided: None

## 2014-02-28 ENCOUNTER — Ambulatory Visit (INDEPENDENT_AMBULATORY_CARE_PROVIDER_SITE_OTHER): Payer: Medicare PPO | Admitting: Emergency Medicine

## 2014-02-28 ENCOUNTER — Encounter: Payer: Self-pay | Admitting: Emergency Medicine

## 2014-02-28 VITALS — BP 110/78 | HR 59 | Temp 98.1°F | Resp 16 | Ht 71.25 in | Wt 211.8 lb

## 2014-02-28 DIAGNOSIS — Z23 Encounter for immunization: Secondary | ICD-10-CM

## 2014-02-28 DIAGNOSIS — C9210 Chronic myeloid leukemia, BCR/ABL-positive, not having achieved remission: Secondary | ICD-10-CM

## 2014-02-28 DIAGNOSIS — C9290 Myeloid leukemia, unspecified, not having achieved remission: Secondary | ICD-10-CM

## 2014-02-28 DIAGNOSIS — I493 Ventricular premature depolarization: Secondary | ICD-10-CM

## 2014-02-28 DIAGNOSIS — Z1211 Encounter for screening for malignant neoplasm of colon: Secondary | ICD-10-CM

## 2014-02-28 DIAGNOSIS — E785 Hyperlipidemia, unspecified: Secondary | ICD-10-CM

## 2014-02-28 DIAGNOSIS — Z125 Encounter for screening for malignant neoplasm of prostate: Secondary | ICD-10-CM

## 2014-02-28 DIAGNOSIS — E559 Vitamin D deficiency, unspecified: Secondary | ICD-10-CM

## 2014-02-28 DIAGNOSIS — Z0000 Encounter for general adult medical examination without abnormal findings: Secondary | ICD-10-CM

## 2014-02-28 LAB — COMPLETE METABOLIC PANEL WITH GFR
ALBUMIN: 3.6 g/dL (ref 3.5–5.2)
ALT: 20 U/L (ref 0–53)
AST: 20 U/L (ref 0–37)
Alkaline Phosphatase: 72 U/L (ref 39–117)
BILIRUBIN TOTAL: 0.6 mg/dL (ref 0.2–1.2)
BUN: 15 mg/dL (ref 6–23)
CALCIUM: 9.2 mg/dL (ref 8.4–10.5)
CHLORIDE: 106 meq/L (ref 96–112)
CO2: 28 mEq/L (ref 19–32)
Creat: 1.12 mg/dL (ref 0.50–1.35)
GFR, Est African American: 73 mL/min
GFR, Est Non African American: 63 mL/min
GLUCOSE: 107 mg/dL — AB (ref 70–99)
Potassium: 5.1 mEq/L (ref 3.5–5.3)
Sodium: 139 mEq/L (ref 135–145)
Total Protein: 6 g/dL (ref 6.0–8.3)

## 2014-02-28 LAB — VITAMIN D 25 HYDROXY (VIT D DEFICIENCY, FRACTURES): VIT D 25 HYDROXY: 42 ng/mL (ref 30–100)

## 2014-02-28 LAB — CBC WITH DIFFERENTIAL/PLATELET
BASOS PCT: 0 % (ref 0–1)
Basophils Absolute: 0 10*3/uL (ref 0.0–0.1)
Eosinophils Absolute: 0.3 10*3/uL (ref 0.0–0.7)
Eosinophils Relative: 1 % (ref 0–5)
HEMATOCRIT: 48.6 % (ref 39.0–52.0)
HEMOGLOBIN: 16.2 g/dL (ref 13.0–17.0)
LYMPHS ABS: 28.4 10*3/uL — AB (ref 0.7–4.0)
Lymphocytes Relative: 87 % — ABNORMAL HIGH (ref 12–46)
MCH: 30 pg (ref 26.0–34.0)
MCHC: 33.3 g/dL (ref 30.0–36.0)
MCV: 90 fL (ref 78.0–100.0)
MONO ABS: 0.7 10*3/uL (ref 0.1–1.0)
MONOS PCT: 2 % — AB (ref 3–12)
MPV: 11 fL (ref 8.6–12.4)
NEUTROS PCT: 10 % — AB (ref 43–77)
Neutro Abs: 3.3 10*3/uL (ref 1.7–7.7)
Platelets: 170 10*3/uL (ref 150–400)
RBC: 5.4 MIL/uL (ref 4.22–5.81)
RDW: 14.2 % (ref 11.5–15.5)
WBC: 32.6 10*3/uL — ABNORMAL HIGH (ref 4.0–10.5)

## 2014-02-28 LAB — POCT URINALYSIS DIPSTICK
BILIRUBIN UA: NEGATIVE
Blood, UA: NEGATIVE
GLUCOSE UA: NEGATIVE
KETONES UA: NEGATIVE
Nitrite, UA: NEGATIVE
PH UA: 5.5
Protein, UA: NEGATIVE
Spec Grav, UA: 1.025
Urobilinogen, UA: 0.2

## 2014-02-28 LAB — LIPID PANEL
Cholesterol: 175 mg/dL (ref 0–200)
HDL: 30 mg/dL — ABNORMAL LOW (ref >39)
LDL CALC: 117 mg/dL — AB (ref 0–99)
Total CHOL/HDL Ratio: 5.8 Ratio
Triglycerides: 138 mg/dL (ref <150)
VLDL: 28 mg/dL (ref 0–40)

## 2014-02-28 LAB — IFOBT (OCCULT BLOOD): IFOBT: NEGATIVE

## 2014-02-28 MED ORDER — PRAVASTATIN SODIUM 20 MG PO TABS: 20.0000 mg | ORAL_TABLET | Freq: Every day | ORAL | Status: DC

## 2014-02-28 NOTE — Progress Notes (Signed)
Subjective:  This chart was scribed for Francisco Russian, MD by Ladene Artist, ED Scribe. The patient was seen in room 21. Patient's care was started at 9:47 AM.   Patient ID: Francisco Larson, male    DOB: [birth year], 79 y.o.   MRN: [id number]  Chief Complaint  Patient presents with  . Annual Exam  . Medication Refill    Pravachol 20 mg 90 d    HPI HPI Comments: Francisco Larson is a 79 y.o. male, with a h/o hyperlipidemia, leukemia, who presents to the [facility] for an annual exam and medication refill of Pravachol.   Pt reports recent postnasal drip and L ear fullness. Pt periodically uses peroxide to rinse his ears out. He reports h/o cerumen impaction on the R. He reports that cough is unchanged.   Chronic Lymphocytic Leukemia  Pt was diagnosed approximately 19 years ago. He is followed by oncologist, Dr. Lurline Del and reports average readings in the mid 30's. Pt was last treated with chemotherapy 12 years ago. He states that his levels remained unchanged for 5 years afterwards.   Numbness Pt suspects that he has neuropathy in bilateral feetin. He has noticed numbness in his toes and the balls of his feet. Pt denies associated pain. Pt reports associated cramps in his feet that are occurring more frequently. Pt states that he has noticed cramps when lying in bed and with certain movements. Pt also suspects that numbness could be attributed to his cholesterol medication that he has been on for approximately 20 years.   Urinary Frequency  Pt reports gradually improving urinary frequency onset 10 years ago. Pt states that he can sleep for 3-5 hours before waking up to use the restroom.   Hearing Pt is followed by the [facility] at [facility], [place]. He recently received new hearing aids in 04/2013. Pt's first set was 11 years ago.   Preventative Maintenance  Pt is followed by Dr. Claudean Kinds at [facility]. Pt's last colonoscopy was done in 2011  by Dr. Sammuel Cooper; normal. Pt is seen every 5 years by his cardiologist for a stress test.   Dermatology Pt recently had a few benign spots on his face on L side removed in 01/2014 by Dr. Denna Haggard with [facility].   Pt visited [place] with his family the first week of January. He plans to return in a few weeks with a few friends to play golf and attend a couple of baseball games. His favorite team is the [employer] since he is a native of [place].   Past Medical History  Diagnosis Date  . Hyperlipidemia   . Vitamin D deficiency   . Leukemia, chronic lymphocytic   . Shingles   . Arthritis    Current Outpatient Prescriptions on File Prior to Visit  Medication Sig Dispense Refill  . cholecalciferol (VITAMIN D) 1000 UNITS tablet Take 1,000 Units by mouth daily.    . pravastatin (PRAVACHOL) 20 MG tablet Take 1 tablet (20 mg total) by mouth daily. 90 tablet 3  . vitamin B-12 (CYANOCOBALAMIN) 1000 MCG tablet Take 1,000 mcg by mouth daily.     No current facility-administered medications on file prior to visit.   Allergies  Allergen Reactions  . Z-Pak [Azithromycin]    Review of Systems  HENT: Positive for postnasal drip.   Respiratory: Positive for cough.   Genitourinary: Positive for frequency.  Neurological: Positive for numbness.      Objective:  Physical Exam CONSTITUTIONAL: Well developed/well nourished HEAD: Normocephalic/atraumatic EYES: EOMI/PERRL ENMT: Mucous membranes moist, bilateral hearing aids NECK: supple no meningeal signs SPINE/BACK:entire spine nontender CV: S1/S2 noted, no murmurs/rubs/gallops noted LUNGS: Lungs are clear to auscultation bilaterally, no apparent distress ABDOMEN: soft, nontender, no rebound or guarding, bowel sounds noted throughout abdomen GU:no cva tenderness; prostate is normal without hernia; latera area of both prostate lobes is slightly firm without definite nodules  LYMPH: shotty lymphadenopathy at both axillas    NEURO: Pt is awake/alert/appropriate, moves all extremitiesx4. No facial droop.   EXTREMITIES: pulses normal/equal, full ROM, decreased sensation to filament testing of both feet SKIN: warm, color normal PSYCH: no abnormalities of mood noted, alert and oriented to situation    Results for orders placed or performed in visit on 02/28/14  IFOBT POC (occult bld, rslt in office)  Result Value Ref Range   IFOBT Negative   POCT urinalysis dipstick  Result Value Ref Range   Color, UA yellow    Clarity, UA clear    Glucose, UA neg    Bilirubin, UA neg    Ketones, UA neg    Spec Grav, UA 1.025    Blood, UA neg    pH, UA 5.5    Protein, UA neg    Urobilinogen, UA 0.2    Nitrite, UA neg    Leukocytes, UA Trace     Assessment & Plan:   Referral made to [facility]. He had his colonoscopy 5 years ago. Routine labs were done today. He has had trouble with nosebleeds. In the spring he will try and get started back on baby aspirin a day. Advised him to take a Centrum Silver 1 a day. He does have some neuropathy but it is painless so will withhold treatment of this.  I personally performed the services described in this documentation, which was scribed in my presence. The recorded information has been reviewed and is accurate.

## 2014-02-28 NOTE — Addendum Note (Signed)
Addended by: Arlyss Queen A on: 02/28/2014 11:41 AM   Modules accepted: Orders

## 2014-03-01 LAB — PSA, MEDICARE: PSA: 2.5 ng/mL (ref <=4.00)

## 2014-03-12 NOTE — Progress Notes (Signed)
Patient ID: Francisco Larson, male   DOB: June 22, 1935, 79 y.o.   MRN: [id number]     Cardiology Office Note   Date:  03/12/2014   ID:  Francisco Larson, DOB [birth year], MRN [id number]  PCP:  Jenny Reichmann, MD  Cardiologist:   Jenkins Rouge, MD   No chief complaint on file.     History of Present Illness: Francisco Larson is a 79 y.o. male who presents for PAC;s on ECG during routine medical exam   Has a history of elevated lipids on statin.  CLL.    WBC 32.6  PLT 170 LDL 117 Last Rx CLL 2003  Been at this count for last 5 years.    Father and bot grandfathers with premature CAD and MI's in 10's  Had seen Dr Daneen Schick a couple of times for "prevention"  Has had normal ETTls  And echo's  He is active goes to [employer] 4-5x/week and [employer].  No chest pain , dyspnea palpitations.  Takes his statin and LDL good at 117    Past Medical History  Diagnosis Date  . Hyperlipidemia   . Vitamin D deficiency   . Leukemia, chronic lymphocytic   . Shingles   . Arthritis     Past Surgical History  Procedure Laterality Date  . Surgery of lip      tumor  . Tonsillectomy    . Eye surgery    . Vasectomy    . Laparoscopic trans anal and transabdominal rectal resection with coloanal anastomosis       Current Outpatient Prescriptions  Medication Sig Dispense Refill  . cholecalciferol (VITAMIN D) 1000 UNITS tablet Take 1,000 Units by mouth daily.    . pravastatin (PRAVACHOL) 20 MG tablet Take 1 tablet (20 mg total) by mouth daily. 90 tablet 3  . vitamin B-12 (CYANOCOBALAMIN) 1000 MCG tablet Take 1,000 mcg by mouth daily.     No current facility-administered medications for this visit.    Allergies:   Z-pak    Social History:  The patient  reports that he has never smoked. He has never used smokeless tobacco. He reports that he does not drink alcohol or use illicit drugs.   Family History:  The patient's family history includes Asthma in his sister; COPD in his maternal grandmother;  Colon cancer in his sister; Emphysema in his mother; Heart disease in his father.    ROS:  Please see the history of present illness.   Otherwise, review of systems are positive for none.   All other systems are reviewed and negative.    PHYSICAL EXAM: VS:  There were no vitals taken for this visit. , BMI There is no weight on file to calculate BMI. GEN: Well nourished, well developed, in no acute distress HEENT: normal Neck: no JVD, carotid bruits, or masses Cardiac:  RRR; no murmurs, rubs, or gallops,no edema  Respiratory:  clear to auscultation bilaterally, normal work of breathing GI: soft, nontender, nondistended, + BS MS: no deformity or atrophy Skin: warm and dry, no rash Neuro:  Strength and sensation are intact Psych: euthymic mood, full affect   EKG:   02/28/14 SR rate 59  Read as PAC but artifact on transition from V1-3 to V4-6 leads  No change from 2/15  Rate was 69 then  And 02/2012  Rate 59 then    Recent Labs: 02/28/2014: ALT 20; BUN 15; Creatinine 1.12; Hemoglobin 16.2; Platelets 170; Potassium 5.1; Sodium 139    Lipid Panel  Component Value Date/Time   CHOL 175 02/28/2014 1036   TRIG 138 02/28/2014 1036   HDL 30* 02/28/2014 1036   CHOLHDL 5.8 02/28/2014 1036   VLDL 28 02/28/2014 1036   LDLCALC 117* 02/28/2014 1036      Wt Readings from Last 3 Encounters:  02/28/14 96.072 kg (211 lb 12.8 oz)  10/31/13 98.385 kg (216 lb 14.4 oz)  02/22/13 97.433 kg (214 lb 12.8 oz)      Other studies Reviewed: Additional studies/ records that were reviewed today include: records Dr Everlene Farrier and [facility] office notes .    ASSESSMENT AND PLAN:  1. Family History CAD:  No indication for ETT Active and asymptomatic  Baseline ECG is normal .  Will do coronary calcium score to risk stratify 2. Chol:  Continue statin intensify Rx if score over 400 3. CLL  WBC 32,000  Been like that for 5 years.  F/u oncology   Current medicines are reviewed at length with the patient  today.  The patient does not have concerns regarding medicines.  The following changes have been made:  no change  Labs/ tests ordered today include:  Coronary calcium score   No orders of the defined types were placed in this encounter.     Disposition:   FU with  PRN  i    Signed, Jenkins Rouge, MD  03/12/2014 7:07 PM    [facility] [place], [place], [place]  [address] Phone: [phone]; Fax: [phone]

## 2014-03-13 ENCOUNTER — Other Ambulatory Visit: Payer: Self-pay | Admitting: *Deleted

## 2014-03-13 ENCOUNTER — Ambulatory Visit (INDEPENDENT_AMBULATORY_CARE_PROVIDER_SITE_OTHER)
Admission: RE | Admit: 2014-03-13 | Discharge: 2014-03-13 | Disposition: A | Discharge status: Home / Self Care | Payer: Medicare PPO | Source: Ambulatory Visit | Attending: Cardiovascular Disease | Admitting: Cardiovascular Disease

## 2014-03-13 ENCOUNTER — Ambulatory Visit (INDEPENDENT_AMBULATORY_CARE_PROVIDER_SITE_OTHER): Payer: Medicare PPO | Admitting: Cardiovascular Disease

## 2014-03-13 ENCOUNTER — Encounter: Payer: Self-pay | Admitting: Cardiovascular Disease

## 2014-03-13 VITALS — BP 140/78 | HR 58 | Ht 71.0 in | Wt 215.8 lb

## 2014-03-13 DIAGNOSIS — R931 Abnormal findings on diagnostic imaging of heart and coronary circulation: Secondary | ICD-10-CM

## 2014-03-13 DIAGNOSIS — Z8249 Family history of ischemic heart disease and other diseases of the circulatory system: Secondary | ICD-10-CM

## 2014-03-13 NOTE — Patient Instructions (Addendum)
Your physician recommends that you schedule a follow-up appointment in: AS NEEDED   Your physician recommends that you continue on your current medications as directed. Please refer to the Current Medication list given to you today.   CALCIUM SCORE    $150.00   OUT OF POCKET    CALL  IF DECIDE  TO HAVE  DONE

## 2014-03-24 ENCOUNTER — Encounter (HOSPITAL_COMMUNITY): Payer: Self-pay | Admitting: *Deleted

## 2014-04-05 ENCOUNTER — Telehealth (HOSPITAL_COMMUNITY): Payer: Self-pay

## 2014-04-05 NOTE — Telephone Encounter (Signed)
Encounter complete. 

## 2014-04-06 ENCOUNTER — Telehealth (HOSPITAL_COMMUNITY): Payer: Self-pay

## 2014-04-06 NOTE — Telephone Encounter (Signed)
Encounter complete. 

## 2014-04-07 ENCOUNTER — Ambulatory Visit (HOSPITAL_COMMUNITY)
Admission: RE | Admit: 2014-04-07 | Discharge: 2014-04-07 | Disposition: A | Discharge status: Home / Self Care | Payer: Medicare PPO | Source: Ambulatory Visit | Attending: Cardiology | Admitting: Cardiology

## 2014-04-07 DIAGNOSIS — R931 Abnormal findings on diagnostic imaging of heart and coronary circulation: Secondary | ICD-10-CM

## 2014-04-07 DIAGNOSIS — I2510 Atherosclerotic heart disease of native coronary artery without angina pectoris: Secondary | ICD-10-CM | POA: Insufficient documentation

## 2014-04-07 NOTE — Procedures (Signed)
Exercise Treadmill Test  Pre-Exercise Testing Evaluation  NSR  Test  Exercise Tolerance Test Ordering MD: Jenkins Rouge, MD    Unique Test No: 1  Treadmill:  1  Indication for ETT: Palpitations`  Contraindication to ETT: No   Stress Modality: exercise - treadmill  Cardiac Imaging Performed: non   Protocol: standard Bruce - maximal  Max BP:  207/77  Max MPHR (bpm):  144 85% MPR (bpm):  122  MPHR obtained (bpm):  134 % MPHR obtained:  93  Reached 85% MPHR (min:sec):  7:45 Total Exercise Time (min-sec):  9  Workload in METS:  10.1 Borg Scale: 15  Reason ETT Terminated:  dyspnea    ST Segment Analysis At Rest: normal ST segments - no evidence of significant ST depression With Exercise: no evidence of significant ST depression  Other Information Arrhythmia:  rare PVCs Angina during ETT:  absent (0) Quality of ETT:  diagnostic  ETT Interpretation:  normal - no evidence of ischemia by ST analysis  Comments: Good exercise tolerance. Normotensive response to exercise   Sanda Klein, MD, [facility] [phone] office [phone] pager

## 2014-07-26 ENCOUNTER — Encounter: Payer: Self-pay | Admitting: Gastroenterology

## 2014-08-14 ENCOUNTER — Encounter: Payer: Self-pay | Admitting: Gastroenterology

## 2014-09-04 ENCOUNTER — Other Ambulatory Visit: Payer: Self-pay | Admitting: *Deleted

## 2014-09-04 DIAGNOSIS — C9110 Chronic lymphocytic leukemia of B-cell type not having achieved remission: Secondary | ICD-10-CM

## 2014-09-05 ENCOUNTER — Other Ambulatory Visit (HOSPITAL_BASED_OUTPATIENT_CLINIC_OR_DEPARTMENT_OTHER): Payer: Medicare PPO

## 2014-09-05 DIAGNOSIS — C9110 Chronic lymphocytic leukemia of B-cell type not having achieved remission: Secondary | ICD-10-CM

## 2014-09-05 LAB — COMPREHENSIVE METABOLIC PANEL (CC13)
ALBUMIN: 3.2 g/dL — AB (ref 3.5–5.0)
ALK PHOS: 72 U/L (ref 40–150)
ALT: 25 U/L (ref 0–55)
ANION GAP: 7 meq/L (ref 3–11)
AST: 24 U/L (ref 5–34)
BILIRUBIN TOTAL: 0.46 mg/dL (ref 0.20–1.20)
BUN: 17.4 mg/dL (ref 7.0–26.0)
CO2: 25 mEq/L (ref 22–29)
CREATININE: 1.1 mg/dL (ref 0.7–1.3)
Calcium: 8.8 mg/dL (ref 8.4–10.4)
Chloride: 111 mEq/L — ABNORMAL HIGH (ref 98–109)
EGFR: 67 mL/min/{1.73_m2} — ABNORMAL LOW (ref >90)
GLUCOSE: 112 mg/dL (ref 70–140)
Potassium: 4.8 mEq/L (ref 3.5–5.1)
Sodium: 143 mEq/L (ref 136–145)
TOTAL PROTEIN: 5.8 g/dL — AB (ref 6.4–8.3)

## 2014-09-05 LAB — CBC WITH DIFFERENTIAL/PLATELET
BASO%: 0.2 % (ref 0.0–2.0)
BASOS ABS: 0.1 10*3/uL (ref 0.0–0.1)
EOS%: 0.6 % (ref 0.0–7.0)
Eosinophils Absolute: 0.2 10*3/uL (ref 0.0–0.5)
HEMATOCRIT: 47.7 % (ref 38.4–49.9)
HGB: 15.6 g/dL (ref 13.0–17.1)
LYMPH%: 87.5 % — AB (ref 14.0–49.0)
MCH: 29.8 pg (ref 27.2–33.4)
MCHC: 32.7 g/dL (ref 32.0–36.0)
MCV: 91 fL (ref 79.3–98.0)
MONO#: 0.8 10*3/uL (ref 0.1–0.9)
MONO%: 2.6 % (ref 0.0–14.0)
NEUT#: 2.9 10*3/uL (ref 1.5–6.5)
NEUT%: 9.1 % — AB (ref 39.0–75.0)
Platelets: 151 10*3/uL (ref 140–400)
RBC: 5.25 10*6/uL (ref 4.20–5.82)
RDW: 14.1 % (ref 11.0–14.6)
WBC: 32.3 10*3/uL — ABNORMAL HIGH (ref 4.0–10.3)
lymph#: 28.3 10*3/uL — ABNORMAL HIGH (ref 0.9–3.3)

## 2014-09-05 LAB — LACTATE DEHYDROGENASE (CC13): LDH: 155 U/L (ref 125–245)

## 2014-09-05 LAB — TECHNOLOGIST REVIEW

## 2014-09-07 ENCOUNTER — Ambulatory Visit (INDEPENDENT_AMBULATORY_CARE_PROVIDER_SITE_OTHER): Payer: Medicare PPO | Admitting: Emergency Medicine

## 2014-09-07 ENCOUNTER — Encounter: Payer: Self-pay | Admitting: Emergency Medicine

## 2014-09-07 VITALS — BP 140/84 | HR 68 | Temp 98.2°F | Resp 16 | Ht 71.5 in | Wt 211.6 lb

## 2014-09-07 DIAGNOSIS — R972 Elevated prostate specific antigen [PSA]: Secondary | ICD-10-CM | POA: Diagnosis not present

## 2014-09-07 DIAGNOSIS — E782 Mixed hyperlipidemia: Secondary | ICD-10-CM | POA: Diagnosis not present

## 2014-09-07 DIAGNOSIS — R05 Cough: Secondary | ICD-10-CM | POA: Diagnosis not present

## 2014-09-07 DIAGNOSIS — E559 Vitamin D deficiency, unspecified: Secondary | ICD-10-CM

## 2014-09-07 DIAGNOSIS — R059 Cough, unspecified: Secondary | ICD-10-CM

## 2014-09-07 LAB — LIPID PANEL
CHOL/HDL RATIO: 5.5 ratio — AB (ref <=5.0)
CHOLESTEROL: 176 mg/dL (ref 125–200)
HDL: 32 mg/dL — AB (ref >=40)
LDL CALC: 119 mg/dL (ref <130)
TRIGLYCERIDES: 127 mg/dL (ref <150)
VLDL: 25 mg/dL (ref <30)

## 2014-09-07 LAB — VITAMIN D 25 HYDROXY (VIT D DEFICIENCY, FRACTURES): Vit D, 25-Hydroxy: 37 ng/mL (ref 30–100)

## 2014-09-07 LAB — IGG, IGA, IGM
IGM, SERUM: 20 mg/dL — AB (ref 41–251)
IgA: 109 mg/dL (ref 68–379)
IgG (Immunoglobin G), Serum: 892 mg/dL (ref 650–1600)

## 2014-09-07 LAB — C-REACTIVE PROTEIN: CRP: <0.5 mg/dL (ref <0.60)

## 2014-09-07 LAB — SEDIMENTATION RATE: Sed Rate: 1 mm/hr (ref 0–20)

## 2014-09-07 LAB — BETA 2 MICROGLOBULIN, SERUM: Beta-2 Microglobulin: 3.2 mg/L — ABNORMAL HIGH (ref <=2.51)

## 2014-09-07 NOTE — Progress Notes (Addendum)
Patient ID: Francisco Larson, male   DOB: June 19, 1935, 79 y.o.   MRN: [id number]    This chart was scribed for Nena Jordan, MD by [facility], medical scribe at [facility].The patient was seen in exam room 21 and the patient's care was started at 8:08 AM.  Chief Complaint:  Chief Complaint  Patient presents with  . Follow-up    6 mos  . Hyperlipidemia  . Labs    PSA  . Leukemia  . Vit D def   HPI: Francisco Larson is a 79 y.o. male with a history of hyperlipidemia and leukemia who reports to [facility] today for a six month follow up.  Chronic Lymphocytic Leukemia: Followed by oncologist, Dr. Tora Duck. Last treatment was 13 years ago. White cell count has remained steady in the 35-38 range.  Hyperlipidemia: Takes pravastatin 20 mg daily. Lab Results  Component Value Date   CHOL 175 02/28/2014   HDL 30* 02/28/2014   LDLCALC 117* 02/28/2014   TRIG 138 02/28/2014   CHOLHDL 5.8 02/28/2014   Prostate screening:  Lab Results  Component Value Date   PSA 2.50 02/28/2014   PSA 2.12 02/22/2013   PSA 2.02 01/27/2012   Cough: He suspects this is related to the season changes, every morning he has as coughing spell. No cough at night.  Health Maintenance:  Colonoscopy due, and scheduled in October 2016.  Past Medical History  Diagnosis Date  . Hyperlipidemia   . Vitamin D deficiency   . Leukemia, chronic lymphocytic   . Shingles   . Arthritis    Past Surgical History  Procedure Laterality Date  . Surgery of lip      tumor  . Tonsillectomy    . Eye surgery    . Vasectomy    . Laparoscopic trans anal and transabdominal rectal resection with coloanal anastomosis     Social History   Social History  . Marital Status: Married    Spouse Name: N/A  . Number of Children: 2  . Years of Education: N/A   Occupational History  . Retired     Professor [employer]   Social History Main Topics  . Smoking status: Never Smoker   . Smokeless tobacco:  Never Used  . Alcohol Use: No  . Drug Use: No  . Sexual Activity: Not Asked   Other Topics Concern  . None   Social History Narrative   Family History  Problem Relation Age of Onset  . Colon cancer Sister   . Heart disease Father   . Emphysema Mother     never smoker, but exposed to 2nd hand from spouse  . Asthma Sister   . COPD Maternal Grandmother     never smoker   Allergies  Allergen Reactions  . Z-Pak [Azithromycin]    Prior to Admission medications   Medication Sig Start Date End Date Taking? Authorizing Provider  cholecalciferol (VITAMIN D) 1000 UNITS tablet Take 1,000 Units by mouth daily.    Historical Provider, MD  pravastatin (PRAVACHOL) 20 MG tablet Take 1 tablet (20 mg total) by mouth daily. 02/28/14   [name], MD  vitamin B-12 (CYANOCOBALAMIN) 1000 MCG tablet Take 1,000 mcg by mouth daily.    Historical Provider, MD   ROS: The patient denies fevers, chills, night sweats, unintentional weight loss, chest pain, palpitations, wheezing, dyspnea on exertion, nausea, vomiting, abdominal pain, dysuria, hematuria, melena, numbness, weakness, or tingling.  All other systems have been reviewed and  were otherwise negative with the exception of those mentioned in the HPI and as above.    PHYSICAL EXAM: Filed Vitals:   09/07/14 0805  BP: 140/84  Pulse: 68  Temp: 98.2 F (36.8 C)  Resp: 16   Body mass index is 29.1 kg/(m^2).  General: Alert, no acute distress HEENT:  Normocephalic, atraumatic, oropharynx patent. Eye: Juliette Mangle [facility] Cardiovascular:  Regular rate and rhythm, no rubs murmurs or gallops.  No Carotid bruits, radial pulse intact. No pedal edema.  Respiratory: Clear to auscultation bilaterally.  No wheezes, rales, or rhonchi.  No cyanosis, no use of accessory musculature Abdominal: No organomegaly, abdomen is soft and non-tender, positive bowel sounds.  No masses. Musculoskeletal: Gait intact. No edema, tenderness Skin: No rashes. Neurologic: Facial  musculature symmetric. Psychiatric: Patient acts appropriately throughout our interaction. Lymphatic: No cervical or submandibular lymphadenopathy Genitourinary/Anorectal: No acute findings  LABS: Results for orders placed or performed in visit on 09/05/14  TECHNOLOGIST REVIEW  Result Value Ref Range   Technologist Review Variant lymphs present, smudge cells   C-reactive protein  Result Value Ref Range   CRP <0.5 <0.60 mg/dL  Sedimentation rate  Result Value Ref Range   Sed Rate 1 0 - 20 mm/hr  CBC with Differential  Result Value Ref Range   WBC 32.3 (H) 4.0 - 10.3 10e3/uL   NEUT# 2.9 1.5 - 6.5 10e3/uL   HGB 15.6 13.0 - 17.1 g/dL   HCT 47.7 38.4 - 49.9 %   Platelets 151 140 - 400 10e3/uL   MCV 91.0 79.3 - 98.0 fL   MCH 29.8 27.2 - 33.4 pg   MCHC 32.7 32.0 - 36.0 g/dL   RBC 5.25 4.20 - 5.82 10e6/uL   RDW 14.1 11.0 - 14.6 %   lymph# 28.3 (H) 0.9 - 3.3 10e3/uL   MONO# 0.8 0.1 - 0.9 10e3/uL   Eosinophils Absolute 0.2 0.0 - 0.5 10e3/uL   Basophils Absolute 0.1 0.0 - 0.1 10e3/uL   NEUT% 9.1 (L) 39.0 - 75.0 %   LYMPH% 87.5 (H) 14.0 - 49.0 %   MONO% 2.6 0.0 - 14.0 %   EOS% 0.6 0.0 - 7.0 %   BASO% 0.2 0.0 - 2.0 %  Comprehensive metabolic panel (Cmet) - [facility]  Result Value Ref Range   Sodium 143 136 - 145 mEq/L   Potassium 4.8 3.5 - 5.1 mEq/L   Chloride 111 (H) 98 - 109 mEq/L   CO2 25 22 - 29 mEq/L   Glucose 112 70 - 140 mg/dl   BUN 17.4 7.0 - 26.0 mg/dL   Creatinine 1.1 0.7 - 1.3 mg/dL   Total Bilirubin 0.46 0.20 - 1.20 mg/dL   Alkaline Phosphatase 72 40 - 150 U/L   AST 24 5 - 34 U/L   ALT 25 0 - 55 U/L   Total Protein 5.8 (L) 6.4 - 8.3 g/dL   Albumin 3.2 (L) 3.5 - 5.0 g/dL   Calcium 8.8 8.4 - 10.4 mg/dL   Anion Gap 7 3 - 11 mEq/L   EGFR 67 (L) >90 ml/min/1.73 m2  QIG  (Quant. immunoglobulins  - IgG, IgA, IgM)  Result Value Ref Range   IgG (Immunoglobin G), Serum 892 650 - 1600 mg/dL   IgA 109 68 - 379 mg/dL   IgM, Serum 20 (L) 41 - 251 mg/dL  Lactate dehydrogenase    Result Value Ref Range   LDH 155 125 - 245 U/L   ASSESSMENT/PLAN: Patient looks great. He is active and traveling. He will  get his flu shot in October. He has had both pneumococcal vaccines. We'll recheck in 6 months for his yearly physical. A PSA lipid panel and vitamin D level were done today.I personally performed the services described in this documentation, which was scribed in my presence. The recorded information has been reviewed and is accurate.  Nena Jordan, MD Gross sideeffects, risk and benefits, and alternatives of medications d/w patient. Patient is aware that all medications have potential sideeffects and we are unable to predict every sideeffect or drug-drug interaction that may occur.    Arlyss Queen MD 09/07/2014 8:06 AM

## 2014-09-08 LAB — PSA, MEDICARE: PSA: 2.63 ng/mL (ref <=4.00)

## 2014-09-12 ENCOUNTER — Telehealth: Payer: Self-pay | Admitting: Oncology

## 2014-09-12 ENCOUNTER — Ambulatory Visit (HOSPITAL_BASED_OUTPATIENT_CLINIC_OR_DEPARTMENT_OTHER): Payer: Medicare PPO | Admitting: Oncology

## 2014-09-12 VITALS — BP 131/67 | HR 63 | Temp 97.8°F | Resp 18 | Ht 71.5 in | Wt 210.7 lb

## 2014-09-12 DIAGNOSIS — Z856 Personal history of leukemia: Secondary | ICD-10-CM | POA: Diagnosis not present

## 2014-09-12 DIAGNOSIS — C9110 Chronic lymphocytic leukemia of B-cell type not having achieved remission: Secondary | ICD-10-CM

## 2014-09-12 NOTE — Progress Notes (Signed)
ID: Francisco Larson   DOB: 31-Oct-1935  MR#: [id number]  LDJ#:[id number]  PCP: Francisco Jordan MD SU: Alphonsa Overall MD OTHER MD: Daneen Schick, [name]    INTERVAL HISTORY: Francisco Larson  returns today for followup of his chronic lymphocytic leukemia. The interval history is unremarkable. [place] have [place]ess [place] and other places in the country. Family is doing well.--He actually forgot his 6 month lab appointment here, and I think that to him that perhaps once a year visits will be fine from now on  REVIEW OF SYSTEMS: He has not had unexplained fatigue or unexplained weight loss, adenopathy, fevers, rash, pruritus, or bleeding. He complains of hearing loss. He has mild sinus problems and keeps a little bit of a cough which is occasionally productive of clear phlegm. This has been evaluated by his primary care physician. He denies obvious allergy problems or obvious problems with reflux. There has not been fever or pleurisy. He has joint pains here in there which are not more intense or persistent than before a detailed review of systems today was otherwise stable  PAST MEDICAL HISTORY: Past Medical History  Diagnosis Date  . Hyperlipidemia   . Vitamin D deficiency   . Leukemia, chronic lymphocytic   . Shingles   . Arthritis     PAST SURGICAL HISTORY: Past Surgical History  Procedure Laterality Date  . Surgery of lip      tumor  . Tonsillectomy    . Eye surgery    . Vasectomy    . Laparoscopic trans anal and transabdominal rectal resection with coloanal anastomosis      FAMILY HISTORY Family History  Problem Relation Age of Onset  . Colon cancer Sister   . Heart disease Father   . Emphysema Mother     never smoker, but exposed to 2nd hand from spouse  . Asthma Sister   . COPD Maternal Grandmother     never smoker   The patient's mother died at the age of 5. The patient's father died at the age of 65 from heart disease. He had no brothers. His only sister, currently 22  years old, has a history of late breast cancer and late middle-aged colon cancer.  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 His been married 50+ years, and their children are [place] lives in [place] and has 4 children, and [place] who lives in [place] and has no children.  Francisco Larson is an elder at [employer].  ADVANCED DIRECTIVES: in place  HEALTH MAINTENANCE: Social History  Substance Use Topics  . Smoking status: Never Smoker   . Smokeless tobacco: Never Used  . Alcohol Use: No     Colonoscopy:  PSA:  Bone density:  Lipid panel:  Allergies  Allergen Reactions  . Z-Pak [Azithromycin]     Current Outpatient Prescriptions  Medication Sig Dispense Refill  . aspirin 81 MG tablet Take 81 mg by mouth daily.    . Multiple Vitamins-Minerals (CENTRUM SILVER PO) Take by mouth.    . pravastatin (PRAVACHOL) 20 MG tablet Take 1 tablet (20 mg total) by mouth daily. 90 tablet 3   No current facility-administered medications for this visit.    OBJECTIVE: Middle-aged white man in no acute distress Filed Vitals:   09/12/14 1045  BP: 131/67  Pulse: 63  Temp: 97.8 F (36.6 C)  Resp: 18     Body mass index is 28.98 kg/(m^2).  ECOG FS: 0  Sclerae unicteric, EOMs intact Oropharynx clear, dentition in good repair No cervical or supraclavicular adenopathy; no axillary or inguinal adenopathy Lungs no rales or rhonchi Heart regular rate and rhythm Abd soft, nontender, positive bowel sounds, no splenomegaly MSK no focal spinal tenderness, no upper extremity lymphedema Neuro: nonfocal, well oriented, appropriate affect   LAB: Results for DABNEY, SCHANZ (MRN [id number]) as of 09/12/2014 11:03  Ref. Range 11/22/2012 08:30 04/25/2013 09:16 07/27/2013 09:37 10/25/2013 08:24 09/05/2014 08:13  lymph# Latest Ref Range: 0.9-3.3 10e3/uL 34.4 (H) 32.5 (H) 35.2 (H) 33.0 (H) 28.3 (H)   Lab Results   Component Value Date   WBC 32.3* 09/05/2014   NEUTROABS 2.9 09/05/2014   HGB 15.6 09/05/2014   HCT 47.7 09/05/2014   MCV 91.0 09/05/2014   PLT 151 09/05/2014      Chemistry      Component Value Date/Time   NA 143 09/05/2014 0813   NA 139 02/28/2014 1036   K 4.8 09/05/2014 0813   K 5.1 02/28/2014 1036   CL 106 02/28/2014 1036   CL 108* 05/26/2012 0804   CO2 25 09/05/2014 0813   CO2 28 02/28/2014 1036   BUN 17.4 09/05/2014 0813   BUN 15 02/28/2014 1036   CREATININE 1.1 09/05/2014 0813   CREATININE 1.12 02/28/2014 1036   CREATININE 1.04 05/08/2011 0818      Component Value Date/Time   CALCIUM 8.8 09/05/2014 0813   CALCIUM 9.2 02/28/2014 1036   ALKPHOS 72 09/05/2014 0813   ALKPHOS 72 02/28/2014 1036   AST 24 09/05/2014 0813   AST 20 02/28/2014 1036   ALT 25 09/05/2014 0813   ALT 20 02/28/2014 1036   BILITOT 0.46 09/05/2014 0813   BILITOT 0.6 02/28/2014 1036     Results for ALONZA, KNISLEY (MRN [id number]) as of 09/12/2014 11:03  Ref. Range 09/05/2014 08:13  Sed Rate Latest Ref Range: 0-20 mm/hr 1   Results for LAUTARO, KORAL (MRN [id number]) as of 09/12/2014 11:03  Ref. Range 05/26/2012 08:04 04/25/2013 09:16 07/27/2013 09:38 10/25/2013 08:25 09/05/2014 08:13  Beta-2 Microglobulin Latest Ref Range: <=2.51 mg/L 2.77 (H) 4.22 (H) 3.95 (H) 3.57 (H) 3.20 (H)  Results for ARTHOR, GORTER (MRN [id number]) as of 09/12/2014 11:03  Ref. Range 05/26/2012 08:04 04/25/2013 09:16 07/27/2013 09:37 10/25/2013 08:24 09/05/2014 08:13  LDH Latest Ref Range: 125-245 U/L 166 180 172 183 155    No results found for: LABCA2  No components found for: LABCA125  No results for input(s): INR in the last 168 hours.  Urinalysis No results found for: COLORURINE  STUDIES: CLINICAL DATA: Risk stratification  EXAM: Coronary Calcium Score  TECHNIQUE: The patient was scanned on a Siemens Sensation 16 slice scanner. Axial non-contrast 7m slices were carried out through the heart. The  data set was analyzed on a dedicated work station and scored using the [place]  FINDINGS: [facility]-cardiac: [facility]. RLL possible ILD  Ascending Aorta: 3.8 cm  Pericardium: Normal  Coronary arteries: Scattered calcium in all 3 major coronary arteries  IMPRESSION: Coronary calcium score of 285. This was 656st percentile for age and sex matched control.  PJenkins Rouge  Electronically Signed  By: PJenkins RougeM.D.  On: 03/13/2014 15:29   ASSESSMENT: 79y.o. [place] man with a history of well-differentiated lymphocytic lymphoma/chronic lymphoid leukemia originally diagnosed April 1997, treated with Rituxan in 2001 and 2004, and not requiring treatment since that time.   PLAN:  DBarbarann Ehlersis doing fine  as far as his chronic lymphoid leukemia is concerned. He has required no treatment now and more than 10 years. I'm comfortable seeing him on a once a year basis from this point on.  He usually gets lab work through his primary care physician in 6 months. We will do a CBC, C Met, and some CLL specific labs a week before his next visit here.  Otherwise he knows to call if he develops any of "B" symptoms or other concerns that may be related to his history of chronic lymphoid leukemia. Rhylan Gross C    09/12/2014

## 2014-09-12 NOTE — Telephone Encounter (Signed)
Gave avs & calendar for August 2017 °

## 2014-10-04 ENCOUNTER — Ambulatory Visit (AMBULATORY_SURGERY_CENTER): Payer: Self-pay

## 2014-10-04 VITALS — Ht 71.5 in | Wt 213.2 lb

## 2014-10-04 DIAGNOSIS — Z860100 Personal history of colon polyps, unspecified: Secondary | ICD-10-CM

## 2014-10-04 NOTE — Progress Notes (Signed)
No allergies to eggs or soy No diet/weight loss meds No home oxygen No past problems with anesthesia  Refused emmi 

## 2014-10-17 ENCOUNTER — Encounter: Payer: Self-pay | Admitting: Emergency Medicine

## 2014-10-18 ENCOUNTER — Encounter: Payer: Self-pay | Admitting: Gastroenterology

## 2014-10-18 ENCOUNTER — Ambulatory Visit (AMBULATORY_SURGERY_CENTER): Payer: Medicare PPO | Admitting: Gastroenterology

## 2014-10-18 VITALS — BP 139/79 | HR 61 | Temp 97.0°F | Resp 20 | Ht 71.0 in | Wt 213.0 lb

## 2014-10-18 DIAGNOSIS — D122 Benign neoplasm of ascending colon: Secondary | ICD-10-CM | POA: Diagnosis not present

## 2014-10-18 DIAGNOSIS — D123 Benign neoplasm of transverse colon: Secondary | ICD-10-CM

## 2014-10-18 DIAGNOSIS — K635 Polyp of colon: Secondary | ICD-10-CM | POA: Diagnosis not present

## 2014-10-18 DIAGNOSIS — Z86010 Personal history of colonic polyps: Secondary | ICD-10-CM

## 2014-10-18 DIAGNOSIS — Z1211 Encounter for screening for malignant neoplasm of colon: Secondary | ICD-10-CM | POA: Diagnosis not present

## 2014-10-18 MED ORDER — SODIUM CHLORIDE 0.9 % IV SOLN: 500.0000 mL | INTRAVENOUS | Status: DC

## 2014-10-18 NOTE — Progress Notes (Signed)
A/ox3 pleased with MAC, report to Penny RN 

## 2014-10-18 NOTE — Patient Instructions (Signed)
YOU HAD AN ENDOSCOPIC PROCEDURE TODAY AT THE [facility]:   Refer to the procedure report that was given to you for any specific questions about what was found during the examination.  If the procedure report does not answer your questions, please call your gastroenterologist to clarify.  If you requested that your care partner not be given the details of your procedure findings, then the procedure report has been included in a sealed envelope for you to review at your convenience later.  YOU SHOULD EXPECT: Some feelings of bloating in the abdomen. Passage of more gas than usual.  Walking can help get rid of the air that was put into your GI tract during the procedure and reduce the bloating. If you had a lower endoscopy (such as a colonoscopy or flexible sigmoidoscopy) you may notice spotting of blood in your stool or on the toilet paper. If you underwent a bowel prep for your procedure, you may not have a normal bowel movement for a few days.  Please Note:  You might notice some irritation and congestion in your nose or some drainage.  This is from the oxygen used during your procedure.  There is no need for concern and it should clear up in a day or so.  SYMPTOMS TO REPORT IMMEDIATELY:   Following lower endoscopy (colonoscopy or flexible sigmoidoscopy):  Excessive amounts of blood in the stool  Significant tenderness or worsening of abdominal pains  Swelling of the abdomen that is new, acute  Fever of 100F or higher    For urgent or emergent issues, a gastroenterologist can be reached at any hour by calling [phone].   DIET: Your first meal following the procedure should be a small meal and then it is ok to progress to your normal diet. Heavy or fried foods are harder to digest and may make you feel nauseous or bloated.  Likewise, meals heavy in dairy and vegetables can increase bloating.  Drink plenty of fluids but you should avoid alcoholic beverages for 24  hours.  ACTIVITY:  You should plan to take it easy for the rest of today and you should NOT DRIVE or use heavy machinery until tomorrow (because of the sedation medicines used during the test).    FOLLOW UP: Our staff will call the number listed on your records the next business day following your procedure to check on you and address any questions or concerns that you may have regarding the information given to you following your procedure. If we do not reach you, we will leave a message.  However, if you are feeling well and you are not experiencing any problems, there is no need to return our call.  We will assume that you have returned to your regular daily activities without incident.  If any biopsies were taken you will be contacted by phone or by letter within the next 1-3 weeks.  Please call us at [phone] if you have not heard about the biopsies in 3 weeks.    SIGNATURES/CONFIDENTIALITY: You and/or your care partner have signed paperwork which will be entered into your electronic medical record.  These signatures attest to the fact that that the information above on your After Visit Summary has been reviewed and is understood.  Full responsibility of the confidentiality of this discharge information lies with you and/or your care-partner.   Information on polyps given to you today 

## 2014-10-18 NOTE — Progress Notes (Signed)
Called to room to assist during endoscopic procedure.  Patient ID and intended procedure confirmed with present staff. Received instructions for my participation in the procedure from the performing physician.  

## 2014-10-18 NOTE — Op Note (Signed)
[facility]  [address] [place], [id number]   COLONOSCOPY PROCEDURE REPORT  PATIENT: Francisco Larson, Francisco Larson  MR#: [id number] BIRTHDATE: 12-18-1935 , 79  yrs. old GENDER: male ENDOSCOPIST: Milus Banister, MD PROCEDURE DATE:  10/18/2014 PROCEDURE:   Colonoscopy, screening and Colonoscopy with snare polypectomy First Screening Colonoscopy - Avg.  risk and is 50 yrs.  old or older - No.  Prior Negative Screening - Now for repeat screening. 10 or more years since last screening  History of Adenoma - Now for follow-up colonoscopy & has been > or = to 3 yrs.  N/A  Polyps removed today? Yes ASA CLASS:   Class II INDICATIONS:Screening for colonic neoplasia, FH Colon or Rectal Adenocarcinoma, and (sister with colon cancer, diagnosed in her 39's or 9s). MEDICATIONS: Monitored anesthesia care and Propofol 240 mg IV  DESCRIPTION OF PROCEDURE:   After the risks benefits and alternatives of the procedure were thoroughly explained, informed consent was obtained.  The digital rectal exam revealed no abnormalities of the rectum.   The [id number]  endoscope was introduced through the anus and advanced to the cecum, which was identified by both the appendix and ileocecal valve. No adverse events experienced.   The quality of the prep was excellent.  The instrument was then slowly withdrawn as the colon was fully examined. Estimated blood loss is zero unless otherwise noted in this procedure report.  COLON FINDINGS: Two sessile polyps ranging between 3-23mm in size were found in the transverse colon and ascending colon. Polypectomies were performed with a cold snare.  The resection was complete, the polyp tissue was completely retrieved and sent to histology.   The examination was otherwise normal.  Retroflexed views revealed no abnormalities. The time to cecum = 10.9 Withdrawal time = 8.0   The scope was withdrawn and the procedure completed. COMPLICATIONS: There were  no immediate complications.  ENDOSCOPIC IMPRESSION: 1.   Two sessile polyps ranging between 3-89mm in size were found in the transverse colon and ascending colon; polypectomies were performed with a cold snare 2.   The examination was otherwise normal  RECOMMENDATIONS: If the polyp(s) removed today are proven to be adenomatous (pre-cancerous) polyps, you will need a repeat colonoscopy in 5 years.  You will receive a letter within 1-2 weeks with the results of your biopsy as well as final recommendations.  Please call my office if you have not received a letter after 3 weeks.  eSigned:  Milus Banister, MD 10/18/2014 9:41 AM   cc:  Nena Jordan, MD

## 2014-10-19 ENCOUNTER — Telehealth: Payer: Self-pay | Admitting: *Deleted

## 2014-10-19 NOTE — Telephone Encounter (Signed)
No answer, message left for the patient. 

## 2014-10-25 ENCOUNTER — Encounter: Payer: Self-pay | Admitting: Gastroenterology

## 2014-12-18 DIAGNOSIS — H2513 Age-related nuclear cataract, bilateral: Secondary | ICD-10-CM | POA: Diagnosis not present

## 2014-12-18 DIAGNOSIS — H31002 Unspecified chorioretinal scars, left eye: Secondary | ICD-10-CM | POA: Diagnosis not present

## 2015-02-27 ENCOUNTER — Other Ambulatory Visit: Payer: Self-pay | Admitting: Emergency Medicine

## 2015-03-06 ENCOUNTER — Encounter: Payer: Medicare PPO | Admitting: Emergency Medicine

## 2015-03-15 ENCOUNTER — Encounter: Payer: Self-pay | Admitting: Emergency Medicine

## 2015-03-15 ENCOUNTER — Other Ambulatory Visit: Payer: Self-pay | Admitting: Emergency Medicine

## 2015-03-15 ENCOUNTER — Ambulatory Visit (INDEPENDENT_AMBULATORY_CARE_PROVIDER_SITE_OTHER): Payer: Medicare Other | Admitting: Emergency Medicine

## 2015-03-15 VITALS — BP 126/70 | HR 72 | Temp 97.5°F | Resp 16 | Ht 71.0 in | Wt 213.2 lb

## 2015-03-15 DIAGNOSIS — R972 Elevated prostate specific antigen [PSA]: Secondary | ICD-10-CM | POA: Diagnosis not present

## 2015-03-15 DIAGNOSIS — E559 Vitamin D deficiency, unspecified: Secondary | ICD-10-CM | POA: Diagnosis not present

## 2015-03-15 DIAGNOSIS — Z0000 Encounter for general adult medical examination without abnormal findings: Secondary | ICD-10-CM

## 2015-03-15 DIAGNOSIS — R0989 Other specified symptoms and signs involving the circulatory and respiratory systems: Secondary | ICD-10-CM

## 2015-03-15 DIAGNOSIS — C9210 Chronic myeloid leukemia, BCR/ABL-positive, not having achieved remission: Secondary | ICD-10-CM

## 2015-03-15 DIAGNOSIS — Z13220 Encounter for screening for lipoid disorders: Secondary | ICD-10-CM

## 2015-03-15 DIAGNOSIS — R51 Headache: Secondary | ICD-10-CM

## 2015-03-15 DIAGNOSIS — R519 Headache, unspecified: Secondary | ICD-10-CM

## 2015-03-15 LAB — CBC WITH DIFFERENTIAL/PLATELET
BASOS PCT: 0 % (ref 0–1)
Basophils Absolute: 0 10*3/uL (ref 0.0–0.1)
EOS PCT: 1 % (ref 0–5)
Eosinophils Absolute: 0.3 10*3/uL (ref 0.0–0.7)
HCT: 48.2 % (ref 39.0–52.0)
Hemoglobin: 16.2 g/dL (ref 13.0–17.0)
Lymphocytes Relative: 85 % — ABNORMAL HIGH (ref 12–46)
Lymphs Abs: 26.9 10*3/uL — ABNORMAL HIGH (ref 0.7–4.0)
MCH: 29.9 pg (ref 26.0–34.0)
MCHC: 33.6 g/dL (ref 30.0–36.0)
MCV: 89.1 fL (ref 78.0–100.0)
MONO ABS: 0.9 10*3/uL (ref 0.1–1.0)
MPV: 10.9 fL (ref 8.6–12.4)
Monocytes Relative: 3 % (ref 3–12)
Neutro Abs: 3.5 10*3/uL (ref 1.7–7.7)
Neutrophils Relative %: 11 % — ABNORMAL LOW (ref 43–77)
Platelets: 180 10*3/uL (ref 150–400)
RBC: 5.41 MIL/uL (ref 4.22–5.81)
RDW: 14.5 % (ref 11.5–15.5)
WBC: 31.6 10*3/uL — ABNORMAL HIGH (ref 4.0–10.5)

## 2015-03-15 LAB — COMPLETE METABOLIC PANEL WITH GFR
ALBUMIN: 3.7 g/dL (ref 3.6–5.1)
ALK PHOS: 68 U/L (ref 40–115)
ALT: 21 U/L (ref 9–46)
AST: 23 U/L (ref 10–35)
BUN: 18 mg/dL (ref 7–25)
CALCIUM: 8.9 mg/dL (ref 8.6–10.3)
CO2: 24 mmol/L (ref 20–31)
Chloride: 104 mmol/L (ref 98–110)
Creat: 1.11 mg/dL (ref 0.70–1.18)
GFR, EST AFRICAN AMERICAN: 74 mL/min (ref >=60)
GFR, Est Non African American: 64 mL/min (ref >=60)
GLUCOSE: 109 mg/dL — AB (ref 65–99)
POTASSIUM: 4.5 mmol/L (ref 3.5–5.3)
Sodium: 138 mmol/L (ref 135–146)
Total Bilirubin: 0.6 mg/dL (ref 0.2–1.2)
Total Protein: 6 g/dL — ABNORMAL LOW (ref 6.1–8.1)

## 2015-03-15 LAB — POCT URINALYSIS DIP (MANUAL ENTRY)
BILIRUBIN UA: NEGATIVE
BILIRUBIN UA: NEGATIVE
GLUCOSE UA: NEGATIVE
Leukocytes, UA: NEGATIVE
NITRITE UA: NEGATIVE
PH UA: 5.5
Protein Ur, POC: NEGATIVE
RBC UA: NEGATIVE
Spec Grav, UA: 1.02
Urobilinogen, UA: 0.2

## 2015-03-15 LAB — LIPID PANEL
Cholesterol: 188 mg/dL (ref 125–200)
HDL: 31 mg/dL — ABNORMAL LOW (ref >=40)
LDL CALC: 126 mg/dL (ref <130)
Total CHOL/HDL Ratio: 6.1 Ratio — ABNORMAL HIGH (ref <=5.0)
Triglycerides: 154 mg/dL — ABNORMAL HIGH (ref <150)
VLDL: 31 mg/dL — ABNORMAL HIGH (ref <30)

## 2015-03-15 LAB — TSH: TSH: 5.58 m[IU]/L — AB (ref 0.40–4.50)

## 2015-03-15 NOTE — Patient Instructions (Signed)
I am going to schedule you for a CT head and carotid Doppler study. I have made a referral to cardiology for a yearly check.Health Maintenance, Male A healthy lifestyle and preventative care can promote health and wellness.  Maintain regular health, dental, and eye exams.  Eat a healthy diet. Foods like vegetables, fruits, whole grains, low-fat dairy products, and lean protein foods contain the nutrients you need and are low in calories. Decrease your intake of foods high in solid fats, added sugars, and salt. Get information about a proper diet from your health care provider, if necessary.  Regular physical exercise is one of the most important things you can do for your health. Most adults should get at least 150 minutes of moderate-intensity exercise (any activity that increases your heart rate and causes you to sweat) each week. In addition, most adults need muscle-strengthening exercises on 2 or more days a week.   Maintain a healthy weight. The body mass index (BMI) is a screening tool to identify possible weight problems. It provides an estimate of body fat based on height and weight. Your health care provider can find your BMI and can help you achieve or maintain a healthy weight. For males 20 years and older:  A BMI below 18.5 is considered underweight.  A BMI of 18.5 to 24.9 is normal.  A BMI of 25 to 29.9 is considered overweight.  A BMI of 30 and above is considered obese.  Maintain normal blood lipids and cholesterol by exercising and minimizing your intake of saturated fat. Eat a balanced diet with plenty of fruits and vegetables. Blood tests for lipids and cholesterol should begin at age 105 and be repeated every 5 years. If your lipid or cholesterol levels are high, you are over age 3, or you are at high risk for heart disease, you may need your cholesterol levels checked more frequently.Ongoing high lipid and cholesterol levels should be treated with medicines if diet and  exercise are not working.  If you smoke, find out from your health care provider how to quit. If you do not use tobacco, do not start.  Lung cancer screening is recommended for adults aged 6-80 years who are at high risk for developing lung cancer because of a history of smoking. A yearly low-dose CT scan of the lungs is recommended for people who have at least a 30-pack-year history of smoking and are current smokers or have quit within the past 15 years. A pack year of smoking is smoking an average of 1 pack of cigarettes a day for 1 year (for example, a 30-pack-year history of smoking could mean smoking 1 pack a day for 30 years or 2 packs a day for 15 years). Yearly screening should continue until the smoker has stopped smoking for at least 15 years. Yearly screening should be stopped for people who develop a health problem that would prevent them from having lung cancer treatment.  If you choose to drink alcohol, do not have more than 2 drinks per day. One drink is considered to be 12 oz (360 mL) of beer, 5 oz (150 mL) of wine, or 1.5 oz (45 mL) of liquor.  Avoid the use of street drugs. Do not share needles with anyone. Ask for help if you need support or instructions about stopping the use of drugs.  High blood pressure causes heart disease and increases the risk of stroke. High blood pressure is more likely to develop in:  People who have blood pressure  in the end of the normal range (100-139/85-89 mm Hg).  People who are overweight or obese.  People who are African American.  If you are 55-74 years of age, have your blood pressure checked every 3-5 years. If you are 79 years of age or older, have your blood pressure checked every year. You should have your blood pressure measured twice--once when you are at a hospital or clinic, and once when you are not at a hospital or clinic. Record the average of the two measurements. To check your blood pressure when you are not at a hospital or  clinic, you can use:  An automated blood pressure machine at a pharmacy.  A home blood pressure monitor.  If you are 43-52 years old, ask your health care provider if you should take aspirin to prevent heart disease.  Diabetes screening involves taking a blood sample to check your fasting blood sugar level. This should be done once every 3 years after age 73 if you are at a normal weight and without risk factors for diabetes. Testing should be considered at a younger age or be carried out more frequently if you are overweight and have at least 1 risk factor for diabetes.  Colorectal cancer can be detected and often prevented. Most routine colorectal cancer screening begins at the age of 54 and continues through age 83. However, your health care provider may recommend screening at an earlier age if you have risk factors for colon cancer. On a yearly basis, your health care provider may provide home test kits to check for hidden blood in the stool. A small camera at the end of a tube may be used to directly examine the colon (sigmoidoscopy or colonoscopy) to detect the earliest forms of colorectal cancer. Talk to your health care provider about this at age 64 when routine screening begins. A direct exam of the colon should be repeated every 5-10 years through age 53, unless early forms of precancerous polyps or small growths are found.  People who are at an increased risk for hepatitis B should be screened for this virus. You are considered at high risk for hepatitis B if:  You were born in a country where hepatitis B occurs often. Talk with your health care provider about which countries are considered high risk.  Your parents were born in a high-risk country and you have not received a shot to protect against hepatitis B (hepatitis B vaccine).  You have HIV or AIDS.  You use needles to inject street drugs.  You live with, or have sex with, someone who has hepatitis B.  You are a man who has  sex with other men (MSM).  You get hemodialysis treatment.  You take certain medicines for conditions like cancer, organ transplantation, and autoimmune conditions.  Hepatitis C blood testing is recommended for all people born from [birth year] through 1963 and any individual with known risk factors for hepatitis C.  Healthy men should no longer receive prostate-specific antigen (PSA) blood tests as part of routine cancer screening. Talk to your health care provider about prostate cancer screening.  Testicular cancer screening is not recommended for adolescents or adult males who have no symptoms. Screening includes self-exam, a health care provider exam, and other screening tests. Consult with your health care provider about any symptoms you have or any concerns you have about testicular cancer.  Practice safe sex. Use condoms and avoid high-risk sexual practices to reduce the spread of sexually transmitted infections (STIs).  You should be screened for STIs, including gonorrhea and chlamydia if:  You are sexually active and are younger than 24 years.  You are older than 24 years, and your health care provider tells you that you are at risk for this type of infection.  Your sexual activity has changed since you were last screened, and you are at an increased risk for chlamydia or gonorrhea. Ask your health care provider if you are at risk.  If you are at risk of being infected with HIV, it is recommended that you take a prescription medicine daily to prevent HIV infection. This is called pre-exposure prophylaxis (PrEP). You are considered at risk if:  You are a man who has sex with other men (MSM).  You are a heterosexual man who is sexually active with multiple partners.  You take drugs by injection.  You are sexually active with a partner who has HIV.  Talk with your health care provider about whether you are at high risk of being infected with HIV. If you choose to begin PrEP, you should  first be tested for HIV. You should then be tested every 3 months for as long as you are taking PrEP.  Use sunscreen. Apply sunscreen liberally and repeatedly throughout the day. You should seek shade when your shadow is shorter than you. Protect yourself by wearing long sleeves, pants, a wide-brimmed hat, and sunglasses year round whenever you are outdoors.  Tell your health care provider of new moles or changes in moles, especially if there is a change in shape or color. Also, tell your health care provider if a mole is larger than the size of a pencil eraser.  A one-time screening for abdominal aortic aneurysm (AAA) and surgical repair of large AAAs by ultrasound is recommended for men aged 41-75 years who are current or former smokers.  Stay current with your vaccines (immunizations).   This information is not intended to replace advice given to you by your health care provider. Make sure you discuss any questions you have with your health care provider.   Document Released: 06/28/2007 Document Revised: 01/20/2014 Document Reviewed: 05/27/2010 Elsevier Interactive Patient Education [employer].

## 2015-03-15 NOTE — Progress Notes (Signed)
Patient ID: Francisco Larson, male   DOB: 02-Oct-1935, 80 y.o.   MRN: [id number]    By signing my name below, I, Francisco Larson, attest that this documentation has been prepared under the direction and in the presence of [name], MD Electronically Signed: Ladene Artist, ED Scribe 03/15/2015 at 1:43 PM.  Chief Complaint:  Chief Complaint  Patient presents with  . Annual Exam   HPI: Francisco Larson is a 80 y.o. male who reports to [facility] today for an annual exam. Pt states that he has had cold-like symptoms every month since October. He suspects that his immune system is lower, reporting that it takes longer to get over colds now. Pt reports associated HAs that began behind his eyes and have become more frequent. Pt states that he had a sinus HA every day for 4 weeks that resolved 2 weeks ago. States ibuprofen did not provide any relief of HA. He further reports h/o migraines several years ago. He denies abdominal pain, chest pain and any other pain.   GU Pt reports urinary frequency, stating that he gets up x2-3 nightly. He states that this does not bother him and assumes this is due to aging. Pt does not desire to began medications for symptoms at this time.   Leukemia  Pt was diagnosed with chronic lymphocytic leukemia 20 years ago. He is followed by oncologist Lurline Del, MD annually; last seen 09/12/14. He reports WBC readings of 36,000-38,000 for the past 5 years.   Pt plans to travel to [place] this year.   Past Medical History  Diagnosis Date  . Hyperlipidemia   . Vitamin D deficiency   . Leukemia, chronic lymphocytic ([place])   . Shingles   . Arthritis    Past Surgical History  Procedure Laterality Date  . Surgery of lip      tumor  . Tonsillectomy    . Eye surgery    . Vasectomy    . Laparoscopic trans anal and transabdominal rectal resection with coloanal anastomosis     Social History   Social History  . Marital Status: Married    Spouse Name: N/A    . Number of Children: 2  . Years of Education: N/A   Occupational History  . Retired     Professor [employer]   Social History Main Topics  . Smoking status: Never Smoker   . Smokeless tobacco: Never Used  . Alcohol Use: No  . Drug Use: No  . Sexual Activity: Not Asked   Other Topics Concern  . None   Social History Narrative   Family History  Problem Relation Age of Onset  . Colon cancer Sister   . Hyperlipidemia Sister   . Stroke Sister   . Heart disease Father   . Emphysema Mother     never smoker, but exposed to 2nd hand from spouse  . Cancer Mother   . Asthma Sister   . COPD Maternal Grandmother     never smoker  . Stroke Maternal Grandfather   . Diabetes Paternal Grandmother   . Mental illness Paternal Grandmother    Allergies  Allergen Reactions  . Z-Pak [Azithromycin]    Prior to Admission medications   Medication Sig Start Date End Date Taking? Authorizing Provider  aspirin 81 MG tablet Take 81 mg by mouth daily.    Historical Provider, MD  Ibuprofen 200 MG CAPS Take by mouth.    Historical Provider, MD  Multiple Vitamins-Minerals (CENTRUM SILVER PO)  Take by mouth.    Historical Provider, MD  pravastatin (PRAVACHOL) 20 MG tablet TAKE 1 TABLET BY MOUTH EVERY DAY 02/27/15   [name], MD   ROS: The patient denies fevers, chills, night sweats, unintentional weight loss, -chest pain, palpitations, wheezing, dyspnea on exertion, nausea, vomiting, -abdominal pain, dysuria, hematuria, melena, numbness, weakness, or tingling. +HA, +urinary frequency  All other systems have been reviewed and were otherwise negative with the exception of those mentioned in the HPI and as above.    PHYSICAL EXAM: Filed Vitals:   03/15/15 1311  BP: 126/70  Pulse: 72  Temp: 97.5 F (36.4 C)  Resp: 16   Body mass index is 29.75 kg/(m^2).  General: Alert, no acute distress HEENT:  Normocephalic, atraumatic, oropharynx patent. Eye: [facility] Cardiovascular: Regular rate and  rhythm, no rubs murmurs or gallops. Faint L carotid bruit, radial pulse intact. No pedal edema.  Respiratory: Clear to auscultation bilaterally. No wheezes, rales, or rhonchi. No cyanosis, no use of accessory musculature Abdominal: No organomegaly, abdomen is soft and non-tender, positive bowel sounds. No masses. GU: Prostate is normal size. Musculoskeletal: Gait intact. No edema, tenderness Skin: No rashes. Neurologic: Facial musculature symmetric. Psychiatric: Patient acts appropriately throughout our interaction. Lymphatic: No cervical or submandibular lymphadenopathy  LABS:  EKG/XRAY:   Primary read interpreted by Dr. Everlene Farrier at [facility].  ASSESSMENT/PLAN: Patient looks great. He has a healthy lifestyle. He has been having some left frontal headaches. He has a history of recurrent sinus problems. He also has a faint left carotid bruit. Will schedule a CT head along with carotid Dopplers. I also made appointment for him to see Dr. Acie Fredrickson regarding his yearly cardiac evaluation. Routine labs were done today. He continues to see Dr. Jana Hakim  on a yearly basis for his chronic leukemia.I personally performed the services described in this documentation, which was scribed in my presence. The recorded information has been reviewed and is accurate.    Gross sideeffects, risk and benefits, and alternatives of medications d/w patient. Patient is aware that all medications have potential sideeffects and we are unable to predict every sideeffect or drug-drug interaction that may occur.  Arlyss Queen MD 03/15/2015 1:18 PM

## 2015-03-16 LAB — PATHOLOGIST SMEAR REVIEW

## 2015-03-16 LAB — SEDIMENTATION RATE: Sed Rate: 1 mm/hr (ref 0–20)

## 2015-03-16 LAB — PSA, MEDICARE: PSA: 2.78 ng/mL (ref <=4.00)

## 2015-03-16 LAB — VITAMIN D 25 HYDROXY (VIT D DEFICIENCY, FRACTURES): VIT D 25 HYDROXY: 32 ng/mL (ref 30–100)

## 2015-03-19 ENCOUNTER — Telehealth: Payer: Self-pay

## 2015-03-19 NOTE — Telephone Encounter (Signed)
Pt saw dr Everlene Farrier recently and given a hemocult card and not sure he is supposed to have it since he had a colonoscopy in October.  He also wants to know if he does need this test, he had started the 3 day no red meat and on the 3rd day at some meatloat about 12 noon.  Does he need to start over -best number [phone]

## 2015-03-20 NOTE — Telephone Encounter (Signed)
Pt advised.

## 2015-03-20 NOTE — Telephone Encounter (Signed)
Since he just had the colonoscopy he does not need to do the stool cards.

## 2015-03-21 ENCOUNTER — Telehealth: Payer: Self-pay | Admitting: Radiology

## 2015-03-21 NOTE — Telephone Encounter (Signed)
Error

## 2015-03-26 ENCOUNTER — Ambulatory Visit
Admission: RE | Admit: 2015-03-26 | Discharge: 2015-03-26 | Disposition: A | Discharge status: Home / Self Care | Payer: Medicare Other | Source: Ambulatory Visit | Attending: Emergency Medicine | Admitting: Emergency Medicine

## 2015-03-26 DIAGNOSIS — R0989 Other specified symptoms and signs involving the circulatory and respiratory systems: Secondary | ICD-10-CM

## 2015-03-26 DIAGNOSIS — R519 Headache, unspecified: Secondary | ICD-10-CM

## 2015-03-26 DIAGNOSIS — R51 Headache: Principal | ICD-10-CM

## 2015-04-05 ENCOUNTER — Telehealth: Payer: Self-pay

## 2015-04-05 NOTE — Telephone Encounter (Signed)
Notes Recorded by [name], MD on 03/27/2015 at 7:26 AM There is mild narrowing of both carotids not unexpected with the patient being over 62. CT shows no evidence of an infarct. There is small vessel disease also consistent with age greater than 26. He should have a repeat carotid ultrasound in one year. Please forward a copy to Dr. Pernell Dupre.  Please forward a copy of CT Head results to Dr. Pernell Dupre.

## 2015-05-08 ENCOUNTER — Ambulatory Visit: Payer: Medicare PPO | Admitting: Cardiovascular Disease

## 2015-05-14 ENCOUNTER — Telehealth: Payer: Self-pay

## 2015-05-14 NOTE — Telephone Encounter (Signed)
Patient states he is a patients of Dr. Caren Griffins. He would like to know if in the fall when his doctor, if he could become a patients of yours. Is this ok Dr. Ronnald Ramp?

## 2015-05-15 NOTE — Telephone Encounter (Signed)
Ok with me 

## 2015-05-16 ENCOUNTER — Encounter: Payer: Self-pay | Admitting: Cardiovascular Disease

## 2015-05-16 ENCOUNTER — Ambulatory Visit (INDEPENDENT_AMBULATORY_CARE_PROVIDER_SITE_OTHER): Payer: Medicare Other | Admitting: Cardiovascular Disease

## 2015-05-16 ENCOUNTER — Ambulatory Visit: Payer: Medicare PPO | Admitting: Cardiovascular Disease

## 2015-05-16 VITALS — BP 142/74 | HR 70 | Ht 71.0 in | Wt 217.4 lb

## 2015-05-16 DIAGNOSIS — E785 Hyperlipidemia, unspecified: Secondary | ICD-10-CM | POA: Diagnosis not present

## 2015-05-16 MED ORDER — ATORVASTATIN CALCIUM 20 MG PO TABS: 20.0000 mg | ORAL_TABLET | Freq: Every day | ORAL | Status: DC

## 2015-05-16 NOTE — Progress Notes (Signed)
Patient ID: Francisco Larson, male   DOB: May 24, 1935, 80 y.o.   MRN: [id number]     Cardiology Office Note   Date:  05/16/2015   ID:  Francisco Larson, DOB [birth year], MRN [id number]  PCP:  Jenny Reichmann, MD  Cardiologist:   Jenkins Rouge, MD   No chief complaint on file.     History of Present Illness: Francisco Larson is a 80 y.o. male who presents for PAC;s on ECG during routine medical exam   Has a history of elevated lipids on statin.  CLL.    WBC 32.6  PLT 170 LDL 117 Last Rx CLL 2003  Been at this count for last 5 years.    Father and bot grandfathers with premature CAD and MI's in 61's  Had seen Dr Daneen Schick a couple of times for "prevention"  Has had normal ETTls  And echo's  He is active goes to [employer] 4-5x/week and [employer].  No chest pain , dyspnea palpitations.  Takes his statin and LDL good at 117   Calcium Score:  03/13/14 IMPRESSION: Coronary calcium score of 285. This was 12 st percentile for age and sex matched control.  Duplex 03/26/15 less than 50% bilateral ICA stenosis   Activity limited by chronic left knee pain and torn ACL.  Eating too many carbs   Past Medical History  Diagnosis Date  . Hyperlipidemia   . Vitamin D deficiency   . Leukemia, chronic lymphocytic ([place])   . Shingles   . Arthritis     Past Surgical History  Procedure Laterality Date  . Surgery of lip      tumor  . Tonsillectomy    . Eye surgery    . Vasectomy    . Laparoscopic trans anal and transabdominal rectal resection with coloanal anastomosis       Current Outpatient Prescriptions  Medication Sig Dispense Refill  . aspirin 81 MG tablet Take 81 mg by mouth daily.    . Ibuprofen 200 MG CAPS Take 2 capsules by mouth as needed.     . Multiple Vitamins-Minerals (CENTRUM SILVER PO) Take by mouth.    . pravastatin (PRAVACHOL) 20 MG tablet TAKE 1 TABLET BY MOUTH EVERY DAY 90 tablet 0   No current facility-administered medications for this visit.    Allergies:   Z-pak     Social History:  The patient  reports that he has never smoked. He has never used smokeless tobacco. He reports that he does not drink alcohol or use illicit drugs.   Family History:  The patient's family history includes Asthma in his sister; COPD in his maternal grandmother; Cancer in his mother; Colon cancer in his sister; Diabetes in his paternal grandmother; Emphysema in his mother; Heart disease in his father; Hyperlipidemia in his sister; Mental illness in his paternal grandmother; Stroke in his maternal grandfather and sister.    ROS:  Please see the history of present illness.   Otherwise, review of systems are positive for none.   All other systems are reviewed and negative.    PHYSICAL EXAM: VS:  BP 142/74 mmHg  Pulse 70  Ht 5\' 11"  (1.803 m)  Wt 98.612 kg (217 lb 6.4 oz)  BMI 30.33 kg/m2 , BMI Body mass index is 30.33 kg/(m^2). GEN: Well nourished, well developed, in no acute distress HEENT: normal Neck: no JVD,  Left  carotid bruits, or masses Cardiac:  RRR; 1/6 SEM  murmurs, rubs, or gallops,no edema  Respiratory:  clear to auscultation bilaterally, normal work of breathing GI: soft, nontender, nondistended, + BS MS: no deformity or atrophy Skin: warm and dry, no rash Neuro:  Strength and sensation are intact Psych: euthymic mood, full affect   EKG:   02/28/14 SR rate 59  Read as PAC but artifact on transition from V1-3 to V4-6 leads  No change from 2/15  Rate was 69 then  And 02/2012  Rate 59 then  05/16/15  SR rate 70  Normal ECG    Recent Labs: 03/15/2015: ALT 21; BUN 18; Creat 1.11; Hemoglobin 16.2; Platelets 180; Potassium 4.5; Sodium 138; TSH 5.58*    Lipid Panel    Component Value Date/Time   CHOL 188 03/15/2015 0807   TRIG 154* 03/15/2015 0807   HDL 31* 03/15/2015 0807   CHOLHDL 6.1* 03/15/2015 0807   VLDL 31* 03/15/2015 0807   LDLCALC 126 03/15/2015 0807      Wt Readings from Last 3 Encounters:  05/16/15 98.612 kg (217 lb 6.4 oz)  03/15/15 96.707  kg (213 lb 3.2 oz)  10/18/14 96.616 kg (213 lb)      Other studies Reviewed: Additional studies/ records that were reviewed today include: records Dr Everlene Farrier and [facility] office notes .    ASSESSMENT AND PLAN:  1. Family History CAD:  No indication for ETT Active and asymptomatic  Baseline ECG is normal .  Calcium score 61st percentile 2. Chol:  LDL up change to stronger statin lipitor 20 mg generic f/u labs 3 months  3. CLL  WBC 32,000  Been like that for 5 years.  F/u oncology 4. Left bruit:  Duplex without significant stenosis ASA / Statin  F/u US 2 years    Current medicines are reviewed at length with the patient today.  The patient does not have concerns regarding medicines.  The following changes have been made:  lipitor 20 mg   Labs/ tests ordered today include:  Chol/ Liver 3 months    No orders of the defined types were placed in this encounter.     Disposition:   FU with  Me 3 months     Signed, Jenkins Rouge, MD  05/16/2015 8:25 AM    [facility] [place], [place], [place]  [address] Phone: [phone]; Fax: [phone]

## 2015-05-16 NOTE — Patient Instructions (Addendum)
Medication Instructions:  Your physician has recommended you make the following change in your medication:  1-START Lipitor 20 mg by mouth daily 2-STOP Pravastatin   Labwork: Your physician recommends that you return for lab work in 3 months. Fasting lipid and liver.   Testing/Procedures: NONE  Follow-Up: Your physician wants you to follow-up in 3 months with Dr Johnsie Cancel.   If you need a refill on your cardiac medications before your next appointment, please call your pharmacy.

## 2015-05-16 NOTE — Addendum Note (Signed)
Addended by: Aris Georgia, Tracen Mahler L on: 05/16/2015 08:40 AM   Modules accepted: Orders

## 2015-05-29 ENCOUNTER — Other Ambulatory Visit: Payer: Self-pay | Admitting: Emergency Medicine

## 2015-06-21 ENCOUNTER — Ambulatory Visit (INDEPENDENT_AMBULATORY_CARE_PROVIDER_SITE_OTHER): Payer: Medicare Other | Admitting: Internal Medicine

## 2015-06-21 ENCOUNTER — Encounter: Payer: Self-pay | Admitting: Internal Medicine

## 2015-06-21 VITALS — BP 142/86 | HR 66 | Temp 98.2°F | Resp 16 | Ht 71.0 in | Wt 218.0 lb

## 2015-06-21 DIAGNOSIS — Z23 Encounter for immunization: Secondary | ICD-10-CM | POA: Diagnosis not present

## 2015-06-21 DIAGNOSIS — E785 Hyperlipidemia, unspecified: Secondary | ICD-10-CM

## 2015-06-21 NOTE — Progress Notes (Signed)
Pre visit review using our clinic review tool, if applicable. No additional management support is needed unless otherwise documented below in the visit note. 

## 2015-06-21 NOTE — Patient Instructions (Signed)

## 2015-06-23 NOTE — Progress Notes (Signed)
Subjective:  Patient ID: Francisco Larson, male    DOB: 16-Feb-1935  Age: 80 y.o. MRN: [id number]  CC: Hyperlipidemia  NEW TO ME  HPI Francisco Larson presents for a "meet and greett." He needs a new physician since his prior physician is retiring. He is monitored for CLL and takes a statin for hypercholesterolemia. He feels well today and offers no complaints. He recently had a complete physical.  Outpatient Prescriptions Prior to Visit  Medication Sig Dispense Refill  . aspirin 81 MG tablet Take 81 mg by mouth daily.    Marland Kitchen atorvastatin (LIPITOR) 20 MG tablet Take 1 tablet (20 mg total) by mouth daily. 90 tablet 3  . Ibuprofen 200 MG CAPS Take 2 capsules by mouth as needed.     . Multiple Vitamins-Minerals (CENTRUM SILVER PO) Take by mouth.    . pravastatin (PRAVACHOL) 20 MG tablet TAKE 1 TABLET BY MOUTH EVERY DAY 90 tablet 0   No facility-administered medications prior to visit.    ROS Review of Systems  Constitutional: Negative.  Negative for chills and fatigue.  HENT: Negative.   Eyes: Negative.   Respiratory: Negative.  Negative for cough, choking, shortness of breath and stridor.   Cardiovascular: Negative.  Negative for chest pain, palpitations and leg swelling.  Gastrointestinal: Negative.  Negative for nausea, vomiting, abdominal pain, diarrhea and constipation.  Endocrine: Negative.   Genitourinary: Negative.  Negative for difficulty urinating.  Musculoskeletal: Negative.  Negative for myalgias, back pain, joint swelling, arthralgias and neck pain.  Skin: Negative.   Allergic/Immunologic: Negative.   Neurological: Negative.  Negative for dizziness.  Hematological: Negative.  Negative for adenopathy. Does not bruise/bleed easily.  Psychiatric/Behavioral: Negative.   All other systems reviewed and are negative.   Objective:  BP 142/86 mmHg  Pulse 66  Temp(Src) 98.2 F (36.8 C) (Oral)  Resp 16  Ht 5\' 11"  (1.803 m)  Wt 218 lb (98.884 kg)  BMI 30.42 kg/m2  SpO2  96%  BP Readings from Last 3 Encounters:  06/21/15 142/86  05/16/15 142/74  03/15/15 126/70    Wt Readings from Last 3 Encounters:  06/21/15 218 lb (98.884 kg)  05/16/15 217 lb 6.4 oz (98.612 kg)  03/15/15 213 lb 3.2 oz (96.707 kg)    Physical Exam  Constitutional: He is oriented to person, place, and time. No distress.  HENT:  Mouth/Throat: Oropharynx is clear and moist. No oropharyngeal exudate.  Eyes: Conjunctivae are normal. Right eye exhibits no discharge. Left eye exhibits no discharge. No scleral icterus.  Neck: Normal range of motion. Neck supple. No JVD present. No tracheal deviation present. No thyromegaly present.  Cardiovascular: Normal rate, regular rhythm, normal heart sounds and intact distal pulses.  Exam reveals no gallop and no friction rub.   No murmur heard. Pulmonary/Chest: Effort normal and breath sounds normal. No stridor. No respiratory distress. He has no wheezes. He has no rales. He exhibits no tenderness.  Abdominal: Soft. Bowel sounds are normal. He exhibits no distension and no mass. There is no tenderness. There is no rebound and no guarding.  Musculoskeletal: He exhibits no edema or tenderness.  Lymphadenopathy:    He has no cervical adenopathy.  Neurological: He is oriented to person, place, and time.  Skin: Skin is warm and dry. No rash noted. He is not diaphoretic. No erythema. No pallor.  Vitals reviewed.   Lab Results  Component Value Date   WBC 31.6* 03/15/2015   HGB 16.2 03/15/2015   HCT 48.2 03/15/2015  PLT 180 03/15/2015   GLUCOSE 109* 03/15/2015   CHOL 188 03/15/2015   TRIG 154* 03/15/2015   HDL 31* 03/15/2015   LDLCALC 126 03/15/2015   ALT 21 03/15/2015   AST 23 03/15/2015   NA 138 03/15/2015   K 4.5 03/15/2015   CL 104 03/15/2015   CREATININE 1.11 03/15/2015   BUN 18 03/15/2015   CO2 24 03/15/2015   TSH 5.58* 03/15/2015   PSA 2.78 03/15/2015    Ct Head Wo Contrast  03/26/2015  CLINICAL DATA:  80 year old male with  frontal headaches since Christmas. History of migraines as young adult. Leukemia with last treatment 2003. Initial encounter. EXAM: CT HEAD WITHOUT CONTRAST TECHNIQUE: Contiguous axial images were obtained from the base of the skull through the vertex without intravenous contrast. COMPARISON:  None. FINDINGS: No intracranial hemorrhage or CT evidence of large acute infarct. Mild small vessel disease changes. No intracranial mass lesion noted on this unenhanced exam. Mild global atrophy typical of age without hydrocephalus. Prominent pineal calcification incidentally noted. Vascular calcifications. No orbital abnormality detected. Mastoid air cells, middle ear cavities and visualized paranasal sinuses are clear. No bony destructive lesion. IMPRESSION: No intracranial hemorrhage or CT evidence of large acute infarct. Vascular calcifications. Mild atrophy, typical of age. Mild small vessel disease changes. Electronically Signed   By: Genia Del M.D.   On: 03/26/2015 18:10   US Carotid Duplex Bilateral  03/26/2015  CLINICAL DATA:  Left carotid bruit.  Hyperlipidemia. EXAM: BILATERAL CAROTID DUPLEX ULTRASOUND TECHNIQUE: Pearline Cables scale imaging, color Doppler and duplex ultrasound was performed of bilateral carotid and vertebral arteries in the neck. COMPARISON:  None. REVIEW OF SYSTEMS: Quantification of carotid stenosis is based on velocity parameters that correlate the residual internal carotid diameter with NASCET-based stenosis levels, using the diameter of the distal internal carotid lumen as the denominator for stenosis measurement. The following velocity measurements were obtained: PEAK SYSTOLIC/END DIASTOLIC RIGHT ICA:                     81/30cm/sec CCA:                     [id number] SYSTOLIC ICA/CCA RATIO:  0.8 DIASTOLIC ICA/CCA RATIO: 1.8 ECA:                     154cm/sec LEFT ICA:                     104/22cm/sec CCA:                     [id number] SYSTOLIC ICA/CCA RATIO:  1.0 DIASTOLIC ICA/CCA RATIO:  1.0 ECA:                     146cm/sec FINDINGS: RIGHT CAROTID ARTERY: Mild partially calcified plaque in the carotid bulb extending into proximal internal and external carotid arteries without high-grade stenosis. Normal waveforms and color Doppler signal. RIGHT VERTEBRAL ARTERY:  Normal flow direction and waveform. LEFT CAROTID ARTERY: Eccentric partially calcified plaque in the carotid bulb and proximal ICA. No high-grade stenosis. Normal waveforms and color Doppler signal. LEFT VERTEBRAL ARTERY: Normal flow direction and waveform. IMPRESSION: 1. Bilateral carotid bifurcation and proximal ICA plaque, resulting in less than 50% diameter stenosis. The exam does not exclude plaque ulceration or embolization. Continued surveillance recommended. Electronically Signed   By: Lucrezia Europe M.D.   On: 03/26/2015 16:26    Assessment & Plan:   Delfino Lovett  was seen today for hyperlipidemia.  Diagnoses and all orders for this visit:  Hyperlipidemia LDL goal <130- he is achieved his LDL goal is doing well on the statin.  Need for 23-polyvalent pneumococcal polysaccharide vaccine -     Pneumococcal polysaccharide vaccine 23-valent greater than or equal to 2yo subcutaneous/IM   I have discontinued Mr. Omori pravastatin. I am also having him maintain his aspirin, Multiple Vitamins-Minerals (CENTRUM SILVER PO), Ibuprofen, and atorvastatin.  No orders of the defined types were placed in this encounter.     Follow-up: Return if symptoms worsen or fail to improve.  Scarlette Calico, MD

## 2015-08-22 NOTE — Progress Notes (Signed)
Patient ID: Francisco Larson, male   DOB: Nov 27, 1935, 80 y.o.   MRN: [id number]     Cardiology Office Note   Date:  08/28/2015   ID:  Francisco Larson, DOB 04/03/1935, MRN [id number]  PCP:  Scarlette Calico, MD  Cardiologist:   Jenkins Rouge, MD   Chief Complaint  Patient presents with  . Family HX of CAD    no sx      History of Present Illness: Francisco Larson is a 80 y.o. male who presents for PAC;s on ECG during routine medical exam   Has a history of elevated lipids on statin.  CLL.    WBC 32.6  PLT 170 LDL 117 Last Rx CLL 2003  Been at this count for last 5 years.    Father and bot grandfathers with premature CAD and MI's in 25's  Had seen Dr Daneen Schick a couple of times for "prevention"  Has had normal ETTls  And echo's  He is active goes to [employer] 4-5x/week and [employer].  No chest pain , dyspnea palpitations.  Takes his statin and LDL good at 117   Calcium Score:  03/13/14 IMPRESSION: Coronary calcium score of 285. This was 77 st percentile for age and sex matched control.  Duplex 03/26/15 less than 50% bilateral ICA stenosis   Activity limited by chronic left knee pain and torn ACL.  Eating too many carbs  Nice trip to [place] and [place] Son lives in [place] does rafting tours Daughter lives in [place]  Past Medical History:  Diagnosis Date  . Arthritis   . Hyperlipidemia   . Leukemia, chronic lymphocytic ([place])   . Shingles   . Vitamin D deficiency     Past Surgical History:  Procedure Laterality Date  . EYE SURGERY    . LAPAROSCOPIC TRANS ANAL AND TRANSABDOMINAL RECTAL RESECTION WITH COLOANAL ANASTOMOSIS    . SURGERY OF LIP     tumor  . TONSILLECTOMY    . VASECTOMY       Current Outpatient Prescriptions  Medication Sig Dispense Refill  . aspirin 81 MG tablet Take 81 mg by mouth daily.    Marland Kitchen atorvastatin (LIPITOR) 20 MG tablet Take 1 tablet (20 mg total) by mouth daily. 90 tablet 3  . Ibuprofen 200 MG CAPS Take 2 capsules by mouth  daily as needed (pain).     . Multiple Vitamins-Minerals (CENTRUM SILVER PO) Take 1 tablet by mouth daily.      No current facility-administered medications for this visit.     Allergies:   Z-pak [azithromycin]    Social History:  The patient  reports that he has never smoked. He has never used smokeless tobacco. He reports that he does not drink alcohol or use drugs.   Family History:  The patient's family history includes Asthma in his sister; COPD in his maternal grandmother; Cancer in his mother; Colon cancer in his sister; Diabetes in his paternal grandmother; Emphysema in his mother; Heart disease in his father; Hyperlipidemia in his sister; Mental illness in his paternal grandmother; Stroke in his maternal grandfather and sister.    ROS:  Please see the history of present illness.   Otherwise, review of systems are positive for none.   All other systems are reviewed and negative.    PHYSICAL EXAM: VS:  BP 120/66 (BP Location: Left Arm, Patient Position: Sitting, Cuff Size: Normal)   Pulse 66   Ht 5\' 11"  (1.803 m)   Wt 212 lb 3.2  oz (96.3 kg)   SpO2 96%   BMI 29.60 kg/m  , BMI Body mass index is 29.6 kg/m. GEN: Well nourished, well developed, in no acute distress HEENT: normal Neck: no JVD,  Left  carotid bruits, or masses Cardiac:  RRR; 1/6 SEM  murmurs, rubs, or gallops,no edema  Respiratory:  clear to auscultation bilaterally, normal work of breathing GI: soft, nontender, nondistended, + BS MS: no deformity or atrophy Skin: warm and dry, no rash Neuro:  Strength and sensation are intact Psych: euthymic mood, full affect   EKG:   02/28/14 SR rate 59  Read as PAC but artifact on transition from V1-3 to V4-6 leads  No change from 2/15  Rate was 69 then  And 02/2012  Rate 59 then  05/16/15  SR rate 70  Normal ECG    Recent Labs: 03/15/2015: BUN 18; Creat 1.11; Hemoglobin 16.2; Platelets 180; Potassium 4.5; Sodium 138; TSH 5.58 08/23/2015: ALT 22    Lipid Panel      Component Value Date/Time   CHOL 141 08/23/2015 0754   TRIG 117 08/23/2015 0754   HDL 32 (L) 08/23/2015 0754   CHOLHDL 4.4 08/23/2015 0754   VLDL 23 08/23/2015 0754   LDLCALC 86 08/23/2015 0754      Wt Readings from Last 3 Encounters:  08/28/15 212 lb 3.2 oz (96.3 kg)  06/21/15 218 lb (98.9 kg)  05/16/15 217 lb 6.4 oz (98.6 kg)      Other studies Reviewed: Additional studies/ records that were reviewed today include: records Dr Everlene Farrier and [facility] office notes .    ASSESSMENT AND PLAN:  1. Family History CAD:  No indication for ETT Active and asymptomatic  Baseline ECG is normal .  Calcium score 61st percentile 2. Chol:  LDL 86 continue current dose lipitor  3. CLL  WBC 32,000  Been like that for 5 years.  F/u oncology 4. Left bruit:  Duplex without significant stenosis ASA / Statin  F/u US 2 years    Current medicines are reviewed at length with the patient today.  The patient does not have concerns regarding medicines.  The following changes have been made:     Labs/ tests ordered today include:  Chol/ Liver     No orders of the defined types were placed in this encounter.    Disposition:   FU with  Me a year     Signed, Jenkins Rouge, MD  08/28/2015 8:30 AM    [address], [place], [place]  [address] Phone: [phone]; Fax: [phone]

## 2015-08-23 ENCOUNTER — Other Ambulatory Visit: Payer: Medicare Other | Admitting: *Deleted

## 2015-08-23 DIAGNOSIS — E785 Hyperlipidemia, unspecified: Secondary | ICD-10-CM

## 2015-08-23 LAB — HEPATIC FUNCTION PANEL
ALT: 22 U/L (ref 9–46)
AST: 20 U/L (ref 10–35)
Albumin: 3.6 g/dL (ref 3.6–5.1)
Alkaline Phosphatase: 78 U/L (ref 40–115)
BILIRUBIN TOTAL: 0.7 mg/dL (ref 0.2–1.2)
Bilirubin, Direct: 0.1 mg/dL (ref <=0.2)
Indirect Bilirubin: 0.6 mg/dL (ref 0.2–1.2)
TOTAL PROTEIN: 5.9 g/dL — AB (ref 6.1–8.1)

## 2015-08-23 LAB — LIPID PANEL
CHOLESTEROL: 141 mg/dL (ref 125–200)
HDL: 32 mg/dL — AB (ref >=40)
LDL Cholesterol: 86 mg/dL (ref <130)
Total CHOL/HDL Ratio: 4.4 Ratio (ref <=5.0)
Triglycerides: 117 mg/dL (ref <150)
VLDL: 23 mg/dL (ref <30)

## 2015-08-28 ENCOUNTER — Ambulatory Visit (INDEPENDENT_AMBULATORY_CARE_PROVIDER_SITE_OTHER): Payer: Medicare Other | Admitting: Cardiovascular Disease

## 2015-08-28 ENCOUNTER — Telehealth: Payer: Self-pay

## 2015-08-28 ENCOUNTER — Encounter: Payer: Self-pay | Admitting: Cardiovascular Disease

## 2015-08-28 VITALS — BP 120/66 | HR 66 | Ht 71.0 in | Wt 212.2 lb

## 2015-08-28 DIAGNOSIS — Z8249 Family history of ischemic heart disease and other diseases of the circulatory system: Secondary | ICD-10-CM

## 2015-08-28 DIAGNOSIS — Z79899 Other long term (current) drug therapy: Secondary | ICD-10-CM

## 2015-08-28 NOTE — Telephone Encounter (Signed)
Called patient about lab results. Per Dr. Johnsie Cancel, cholesterol is at goal and LFT's are normal f/u labs in 6 months. Patient verbalized understanding and will have lab work done on 03/06/15.

## 2015-08-28 NOTE — Patient Instructions (Addendum)

## 2015-08-28 NOTE — Telephone Encounter (Signed)
-----   Message from Josue Hector, MD sent at 08/28/2015  7:27 AM EDT ----- Cholesterol is at goal and LFT's are normal F/U labs in 6 months

## 2015-09-10 ENCOUNTER — Other Ambulatory Visit: Payer: Self-pay | Admitting: *Deleted

## 2015-09-10 DIAGNOSIS — C9110 Chronic lymphocytic leukemia of B-cell type not having achieved remission: Secondary | ICD-10-CM

## 2015-09-11 ENCOUNTER — Other Ambulatory Visit (HOSPITAL_BASED_OUTPATIENT_CLINIC_OR_DEPARTMENT_OTHER): Payer: Medicare Other

## 2015-09-11 DIAGNOSIS — Z856 Personal history of leukemia: Secondary | ICD-10-CM

## 2015-09-11 DIAGNOSIS — C9110 Chronic lymphocytic leukemia of B-cell type not having achieved remission: Secondary | ICD-10-CM

## 2015-09-11 LAB — COMPREHENSIVE METABOLIC PANEL
ALBUMIN: 3.1 g/dL — AB (ref 3.5–5.0)
ALK PHOS: 91 U/L (ref 40–150)
ALT: 26 U/L (ref 0–55)
ANION GAP: 9 meq/L (ref 3–11)
AST: 23 U/L (ref 5–34)
BILIRUBIN TOTAL: 0.59 mg/dL (ref 0.20–1.20)
BUN: 18.5 mg/dL (ref 7.0–26.0)
CO2: 23 meq/L (ref 22–29)
Calcium: 8.9 mg/dL (ref 8.4–10.4)
Chloride: 109 mEq/L (ref 98–109)
Creatinine: 1.1 mg/dL (ref 0.7–1.3)
EGFR: 66 mL/min/{1.73_m2} — AB (ref >90)
Glucose: 113 mg/dl (ref 70–140)
POTASSIUM: 4.5 meq/L (ref 3.5–5.1)
SODIUM: 140 meq/L (ref 136–145)
TOTAL PROTEIN: 6 g/dL — AB (ref 6.4–8.3)

## 2015-09-11 LAB — CBC WITH DIFFERENTIAL/PLATELET
BASO%: 0.1 % (ref 0.0–2.0)
Basophils Absolute: 0 10*3/uL (ref 0.0–0.1)
EOS%: 1 % (ref 0.0–7.0)
Eosinophils Absolute: 0.3 10*3/uL (ref 0.0–0.5)
HCT: 49.1 % (ref 38.4–49.9)
HEMOGLOBIN: 15.9 g/dL (ref 13.0–17.1)
LYMPH%: 84.7 % — AB (ref 14.0–49.0)
MCH: 29.3 pg (ref 27.2–33.4)
MCHC: 32.4 g/dL (ref 32.0–36.0)
MCV: 90.5 fL (ref 79.3–98.0)
MONO#: 0.7 10*3/uL (ref 0.1–0.9)
MONO%: 2.1 % (ref 0.0–14.0)
NEUT%: 12.1 % — ABNORMAL LOW (ref 39.0–75.0)
NEUTROS ABS: 4.2 10*3/uL (ref 1.5–6.5)
Platelets: 156 10*3/uL (ref 140–400)
RBC: 5.43 10*6/uL (ref 4.20–5.82)
RDW: 14.2 % (ref 11.0–14.6)
WBC: 34.9 10*3/uL — AB (ref 4.0–10.3)
lymph#: 29.6 10*3/uL — ABNORMAL HIGH (ref 0.9–3.3)

## 2015-09-11 LAB — TECHNOLOGIST REVIEW

## 2015-09-12 LAB — BETA 2 MICROGLOBULIN, SERUM: Beta-2: 2.4 mg/L (ref 0.6–2.4)

## 2015-09-17 NOTE — Progress Notes (Signed)
ID: Francisco Larson   DOB: 1935-08-22  MR#: [id number]  [id number]  PCP: Francisco Jordan MD SU: Francisco Overall MD OTHER MD: Francisco Larson, Francisco Larson    INTERVAL HISTORY: Francisco Larson  returns today for yearly followup of his chronic lymphocytic leukemia. He has generally been feeling well, doing some traveling, and going to the [employer] or 5 days a week doing the elliptical and a stationary bike. He plays golf once a week  REVIEW OF SYSTEMS: His right knee continues to be a problem and it limits how far he can walk. He's had no fever, rash, pruritus, or obvious adenopathy. He does not have unexplained fatigue or unexplained weight loss. A detailed review of systems today was otherwise noncontributory  PAST MEDICAL HISTORY: Past Medical History:  Diagnosis Date  . Arthritis   . Hyperlipidemia   . Leukemia, chronic lymphocytic ([place])   . Shingles   . Vitamin D deficiency     PAST SURGICAL HISTORY: Past Surgical History:  Procedure Laterality Date  . EYE SURGERY    . LAPAROSCOPIC TRANS ANAL AND TRANSABDOMINAL RECTAL RESECTION WITH COLOANAL ANASTOMOSIS    . SURGERY OF LIP     tumor  . TONSILLECTOMY    . VASECTOMY      FAMILY HISTORY Family History  Problem Relation Age of Onset  . Colon cancer Sister   . Hyperlipidemia Sister   . Stroke Sister   . Heart disease Father   . Emphysema Mother     never smoker, but exposed to 2nd hand from spouse  . Cancer Mother   . COPD Maternal Grandmother     never smoker  . Stroke Maternal Grandfather   . Diabetes Paternal Grandmother   . Mental illness Paternal Grandmother   . Asthma Sister    The patient's mother died at the age of 52. The patient's father died at the age of 87 from heart disease. He had no brothers. His only sister, currently 76 years old, has a history of late breast cancer and late middle-aged colon cancer.  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 His been married 50+ years, and their children are Francisco Larson lives in [place] and has 4 children, and Francisco Larson who lives in [place] and has no children.  Francisco Larson is an elder at [employer].  ADVANCED DIRECTIVES: in place  HEALTH MAINTENANCE: Social History  Substance Use Topics  . Smoking status: Never Smoker  . Smokeless tobacco: Never Used  . Alcohol use No     Colonoscopy:  PSA:  Bone density:  Lipid panel:  Allergies  Allergen Reactions  . Z-Pak [Azithromycin] Other (See Comments)    Pt stated he thinks this made him dizzy    Current Outpatient Prescriptions  Medication Sig Dispense Refill  . aspirin 81 MG tablet Take 81 mg by mouth daily.    Marland Kitchen atorvastatin (LIPITOR) 20 MG tablet Take 1 tablet (20 mg total) by mouth daily. 90 tablet 3  . Ibuprofen 200 MG CAPS Take 2 capsules by mouth daily as needed (pain).     . Multiple Vitamins-Minerals (CENTRUM SILVER PO) Take 1 tablet by mouth daily.      No current facility-administered medications for this visit.     OBJECTIVE: Middle-aged white man Who appears well Vitals:   09/18/15 1217  BP: 140/75  Pulse: 68  Resp: 18  Temp: 97.6 F (36.4 Larson)     Body mass  index is 29.53 kg/m.    ECOG FS: 0  Sclerae unicteric, pupils round and equal Oropharynx clear and moist-- no thrush or other lesions No cervical or supraclavicular adenopathy, no axillary or inguinal adenopathy Lungs no rales or rhonchi Heart regular rate and rhythm Abd soft, nontender, positive bowel sounds MSK no focal spinal tenderness, no upper extremity lymphedema Neuro: nonfocal, well oriented, appropriate affect   LAB: Results for [name], [name] (MRN [id number]) as of 09/18/2015 12:32  Ref. Range 04/25/2013 09:16 07/27/2013 09:37 10/25/2013 08:24 09/05/2014 08:13 09/11/2015 07:57  lymph# Latest Ref Range: 0.9 - 3.3 10e3/uL 32.5 (H) 35.2 (H) 33.0 (H) 28.3 (H) 29.6 (H)   Lab Results  Component Value Date   WBC 34.9 (H) 09/11/2015    NEUTROABS 4.2 09/11/2015   HGB 15.9 09/11/2015   HCT 49.1 09/11/2015   MCV 90.5 09/11/2015   PLT 156 09/11/2015      Chemistry      Component Value Date/Time   NA 140 09/11/2015 0757   K 4.5 09/11/2015 0757   CL 104 03/15/2015 0807   CL 108 (H) 05/26/2012 0804   CO2 23 09/11/2015 0757   BUN 18.5 09/11/2015 0757   CREATININE 1.1 09/11/2015 0757      Component Value Date/Time   CALCIUM 8.9 09/11/2015 0757   ALKPHOS 91 09/11/2015 0757   AST 23 09/11/2015 0757   ALT 26 09/11/2015 0757   BILITOT 0.59 09/11/2015 0757      No results found for: LABCA2  No components found for: [id number]  No results for input(s): INR in the last 168 hours.  Urinalysis No results found for: COLORURINE  STUDIES: No results found.  ASSESSMENT: 80 y.o. 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   PLAN:  Francisco Larson is now 20 years out from his initial diagnosis of chronic lymphoid leukemia. He has no symptoms related to this.  His total white cell count remains stable off treatment. He does not have any peripheral adenopathy that I can palpate. He is not anemic or cytopenic and he has no "B" symptoms  Accordingly no intervention is needed. He will see me again in one year. He knows to call for any problems that may develop before that visit.   Francisco Larson    09/18/2015

## 2015-09-18 ENCOUNTER — Ambulatory Visit (HOSPITAL_BASED_OUTPATIENT_CLINIC_OR_DEPARTMENT_OTHER): Payer: Medicare Other | Admitting: Oncology

## 2015-09-18 ENCOUNTER — Telehealth: Payer: Self-pay | Admitting: Oncology

## 2015-09-18 VITALS — BP 140/75 | HR 68 | Temp 97.6°F | Resp 18 | Ht 71.0 in | Wt 211.7 lb

## 2015-09-18 DIAGNOSIS — C9110 Chronic lymphocytic leukemia of B-cell type not having achieved remission: Secondary | ICD-10-CM

## 2015-09-18 NOTE — Telephone Encounter (Signed)
appt made and avs printed °

## 2015-11-08 ENCOUNTER — Ambulatory Visit (INDEPENDENT_AMBULATORY_CARE_PROVIDER_SITE_OTHER): Payer: Medicare Other

## 2015-11-08 DIAGNOSIS — Z23 Encounter for immunization: Secondary | ICD-10-CM

## 2015-12-11 ENCOUNTER — Other Ambulatory Visit: Payer: Self-pay | Admitting: Dermatology

## 2016-03-05 ENCOUNTER — Other Ambulatory Visit: Payer: Medicare Other | Admitting: *Deleted

## 2016-03-05 DIAGNOSIS — Z79899 Other long term (current) drug therapy: Secondary | ICD-10-CM

## 2016-03-05 LAB — HEPATIC FUNCTION PANEL
ALBUMIN: 3.7 g/dL (ref 3.5–4.8)
ALK PHOS: 86 IU/L (ref 39–117)
ALT: 24 IU/L (ref 0–44)
AST: 23 IU/L (ref 0–40)
BILIRUBIN, DIRECT: 0.14 mg/dL (ref 0.00–0.40)
Bilirubin Total: 0.5 mg/dL (ref 0.0–1.2)
TOTAL PROTEIN: 5.6 g/dL — AB (ref 6.0–8.5)

## 2016-03-05 LAB — LIPID PANEL
Chol/HDL Ratio: 4.9 (ref 0.0–5.0)
Cholesterol, Total: 141 mg/dL (ref 100–199)
HDL: 29 mg/dL — ABNORMAL LOW (ref >39)
LDL Calculated: 87 (ref 0–99)
Triglycerides: 125 mg/dL (ref 0–149)
VLDL Cholesterol Cal: 25 (ref 5–40)

## 2016-03-05 NOTE — Addendum Note (Signed)
Addended by: Eulis Foster on: 03/05/2016 07:34 AM   Modules accepted: Orders

## 2016-03-05 NOTE — Addendum Note (Signed)
Addended by: Eulis Foster on: 03/05/2016 07:35 AM   Modules accepted: Orders

## 2016-03-05 NOTE — Addendum Note (Signed)
Addended by: Eulis Foster on: 03/05/2016 07:36 AM   Modules accepted: Orders

## 2016-03-06 ENCOUNTER — Telehealth: Payer: Self-pay

## 2016-03-06 DIAGNOSIS — Z79899 Other long term (current) drug therapy: Secondary | ICD-10-CM

## 2016-03-06 NOTE — Telephone Encounter (Signed)
Patient aware of lab results. Patient has an appointment in August for repeat lab work. Labs have been ordered.

## 2016-03-06 NOTE — Telephone Encounter (Signed)
-----   Message from Josue Hector, MD sent at 03/06/2016 11:22 AM EST ----- Cholesterol is at goal and LFT's are normal F/U labs in 6 months

## 2016-03-17 ENCOUNTER — Ambulatory Visit (INDEPENDENT_AMBULATORY_CARE_PROVIDER_SITE_OTHER): Payer: Medicare Other | Admitting: Internal Medicine

## 2016-03-17 ENCOUNTER — Other Ambulatory Visit (INDEPENDENT_AMBULATORY_CARE_PROVIDER_SITE_OTHER): Payer: Medicare Other

## 2016-03-17 ENCOUNTER — Telehealth: Payer: Self-pay

## 2016-03-17 ENCOUNTER — Encounter: Payer: Self-pay | Admitting: Internal Medicine

## 2016-03-17 VITALS — BP 122/84 | HR 64 | Temp 98.0°F | Resp 16 | Ht 71.0 in | Wt 215.2 lb

## 2016-03-17 DIAGNOSIS — R739 Hyperglycemia, unspecified: Secondary | ICD-10-CM

## 2016-03-17 DIAGNOSIS — R7989 Other specified abnormal findings of blood chemistry: Secondary | ICD-10-CM

## 2016-03-17 DIAGNOSIS — E785 Hyperlipidemia, unspecified: Secondary | ICD-10-CM

## 2016-03-17 DIAGNOSIS — R946 Abnormal results of thyroid function studies: Secondary | ICD-10-CM | POA: Diagnosis not present

## 2016-03-17 DIAGNOSIS — Z0000 Encounter for general adult medical examination without abnormal findings: Secondary | ICD-10-CM

## 2016-03-17 DIAGNOSIS — C9110 Chronic lymphocytic leukemia of B-cell type not having achieved remission: Secondary | ICD-10-CM

## 2016-03-17 DIAGNOSIS — N400 Enlarged prostate without lower urinary tract symptoms: Secondary | ICD-10-CM

## 2016-03-17 DIAGNOSIS — E559 Vitamin D deficiency, unspecified: Secondary | ICD-10-CM | POA: Diagnosis not present

## 2016-03-17 DIAGNOSIS — Z0001 Encounter for general adult medical examination with abnormal findings: Secondary | ICD-10-CM

## 2016-03-17 LAB — COMPREHENSIVE METABOLIC PANEL
ALBUMIN: 3.8 g/dL (ref 3.5–5.2)
ALK PHOS: 87 U/L (ref 39–117)
ALT: 23 U/L (ref 0–53)
AST: 21 U/L (ref 0–37)
BILIRUBIN TOTAL: 0.7 mg/dL (ref 0.2–1.2)
BUN: 19 mg/dL (ref 6–23)
CO2: 28 mEq/L (ref 19–32)
Calcium: 9.1 mg/dL (ref 8.4–10.5)
Chloride: 104 mEq/L (ref 96–112)
Creatinine, Ser: 1.14 mg/dL (ref 0.40–1.50)
GFR: 65.92 mL/min (ref >60.00)
GLUCOSE: 107 mg/dL — AB (ref 70–99)
Potassium: 4.1 mEq/L (ref 3.5–5.1)
SODIUM: 140 meq/L (ref 135–145)
TOTAL PROTEIN: 6.2 g/dL (ref 6.0–8.3)

## 2016-03-17 LAB — URINALYSIS, ROUTINE W REFLEX MICROSCOPIC
BILIRUBIN URINE: NEGATIVE
HGB URINE DIPSTICK: NEGATIVE
KETONES UR: NEGATIVE
Leukocytes, UA: NEGATIVE
NITRITE: NEGATIVE
RBC / HPF: NONE SEEN (ref 0–?)
Specific Gravity, Urine: 1.015 (ref 1.000–1.030)
Total Protein, Urine: NEGATIVE
Urine Glucose: NEGATIVE
Urobilinogen, UA: 0.2 (ref 0.0–1.0)
pH: 6 (ref 5.0–8.0)

## 2016-03-17 LAB — CBC WITH DIFFERENTIAL/PLATELET
BASOS PCT: 0.2 % (ref 0.0–3.0)
Basophils Absolute: 0.1 10*3/uL (ref 0.0–0.1)
Eosinophils Absolute: 0.3 10*3/uL (ref 0.0–0.7)
Eosinophils Relative: 0.7 % (ref 0.0–5.0)
HCT: 49.4 % (ref 39.0–52.0)
HEMOGLOBIN: 16.4 g/dL (ref 13.0–17.0)
Lymphocytes Relative: 86.5 % — ABNORMAL HIGH (ref 12.0–46.0)
Lymphs Abs: 30 10*3/uL — ABNORMAL HIGH (ref 0.7–4.0)
MCHC: 33.2 g/dL (ref 30.0–36.0)
MCV: 90.6 fl (ref 78.0–100.0)
Monocytes Absolute: 0.7 10*3/uL (ref 0.1–1.0)
Monocytes Relative: 2.1 % — ABNORMAL LOW (ref 3.0–12.0)
NEUTROS ABS: 3.6 10*3/uL (ref 1.4–7.7)
Neutrophils Relative %: 10.5 % — ABNORMAL LOW (ref 43.0–77.0)
PLATELETS: 174 10*3/uL (ref 150.0–400.0)
RBC: 5.45 Mil/uL (ref 4.22–5.81)
RDW: 14 % (ref 11.5–15.5)

## 2016-03-17 LAB — THYROID PANEL WITH TSH
FREE THYROXINE INDEX: 2 (ref 1.4–3.8)
T3 Uptake: 31 % (ref 22–35)
T4, Total: 6.6 ug/dL (ref 4.5–12.0)
TSH: 4.78 mIU/L — ABNORMAL HIGH (ref 0.40–4.50)

## 2016-03-17 LAB — HEMOGLOBIN A1C: HEMOGLOBIN A1C: 6.1 % (ref 4.6–6.5)

## 2016-03-17 LAB — PSA: PSA: 3.13 ng/mL (ref 0.10–4.00)

## 2016-03-17 NOTE — Progress Notes (Signed)
Subjective:  Patient ID: Francisco Larson, male    DOB: December 07, 1935  Age: 81 y.o. MRN: [id number]  CC: Annual Exam   HPI [name] presents for a CPX - he complains of mild weight gain but otherwise feels well and offers no other complaints.  History [name] has a past medical history of Arthritis; Hyperlipidemia; Leukemia, chronic lymphocytic ([place]); Shingles; and Vitamin D deficiency.   He has a past surgical history that includes Surgery of lip; Tonsillectomy; Eye surgery; Vasectomy; and Laparoscopic trans anal and transabdominal rectal resection with coloanal anastomosis.   His family history includes Asthma in his sister; COPD in his maternal grandmother; Cancer in his mother; Colon cancer in his sister; Diabetes in his paternal grandmother; Emphysema in his mother; Heart disease in his father; Hyperlipidemia in his sister; Mental illness in his paternal grandmother; Stroke in his maternal grandfather and sister.He reports that he has never smoked. He has never used smokeless tobacco. He reports that he does not drink alcohol or use drugs.  Outpatient Medications Prior to Visit  Medication Sig Dispense Refill  . aspirin 81 MG tablet Take 81 mg by mouth daily.    Marland Kitchen atorvastatin (LIPITOR) 20 MG tablet Take 1 tablet (20 mg total) by mouth daily. 90 tablet 3  . Ibuprofen 200 MG CAPS Take 2 capsules by mouth daily as needed (pain).     . Multiple Vitamins-Minerals (CENTRUM SILVER PO) Take 1 tablet by mouth daily.      No facility-administered medications prior to visit.     ROS Review of Systems  Constitutional: Positive for unexpected weight change. Negative for activity change, appetite change, diaphoresis and fatigue.  HENT: Negative.   Eyes: Negative.  Negative for visual disturbance.  Respiratory: Negative.  Negative for cough, chest tightness, shortness of breath, wheezing and stridor.   Cardiovascular: Negative for chest pain, palpitations and leg swelling.    Gastrointestinal: Negative for abdominal pain, constipation, diarrhea, nausea and vomiting.  Endocrine: Negative.  Negative for cold intolerance and heat intolerance.  Genitourinary: Negative.  Negative for difficulty urinating, dysuria, penile pain, penile swelling, scrotal swelling, testicular pain and urgency.  Musculoskeletal: Negative.  Negative for arthralgias, back pain, myalgias and neck pain.  Skin: Negative.  Negative for color change and rash.  Allergic/Immunologic: Negative.   Neurological: Negative.  Negative for dizziness, weakness, light-headedness and numbness.  Hematological: Negative.  Negative for adenopathy. Does not bruise/bleed easily.  Psychiatric/Behavioral: Negative.     Objective:  BP 122/84   Pulse 64   Temp 98 F (36.7 C) (Oral)   Resp 16   Ht 5\' 11"  (1.803 m)   Wt 215 lb 4 oz (97.6 kg)   SpO2 96%   BMI 30.02 kg/m   Physical Exam  Constitutional: He is oriented to person, place, and time. He appears well-developed and well-nourished. No distress.  HENT:  Mouth/Throat: Oropharynx is clear and moist. No oropharyngeal exudate.  Eyes: Conjunctivae are normal. Right eye exhibits no discharge. Left eye exhibits no discharge. No scleral icterus.  Neck: Normal range of motion. Neck supple. No JVD present. No tracheal deviation present. No thyromegaly present.  Cardiovascular: Normal rate, regular rhythm, normal heart sounds and intact distal pulses.  Exam reveals no gallop and no friction rub.   No murmur heard. Pulmonary/Chest: Effort normal and breath sounds normal. No stridor. No respiratory distress. He has no wheezes. He has no rales. He exhibits no tenderness.  Abdominal: Soft. Bowel sounds are normal. He exhibits no distension and  no mass. There is no tenderness. There is no rebound and no guarding. Hernia confirmed negative in the right inguinal area and confirmed negative in the left inguinal area.  Genitourinary: Rectum normal, testes normal and penis  normal. Rectal exam shows no external hemorrhoid, no internal hemorrhoid, no fissure, no mass, no tenderness, anal tone normal and guaiac negative stool. Prostate is enlarged (1+ smooth symm BPH). Prostate is not tender. Right testis shows no mass, no swelling and no tenderness. Right testis is descended. Left testis shows no mass, no swelling and no tenderness. Left testis is descended. Circumcised. No penile erythema or penile tenderness. No discharge found.  Musculoskeletal: Normal range of motion. He exhibits no edema, tenderness or deformity.  Lymphadenopathy:    He has no cervical adenopathy.       Right: No inguinal adenopathy present.       Left: No inguinal adenopathy present.  Neurological: He is oriented to person, place, and time.  Skin: Skin is dry. No rash noted. He is not diaphoretic. No erythema. No pallor.  Vitals reviewed.   Lab Results  Component Value Date   WBC 34.7 Repeated and verified X2. (HH) 03/17/2016   HGB 16.4 03/17/2016   HCT 49.4 03/17/2016   PLT 174.0 03/17/2016   GLUCOSE 107 (H) 03/17/2016   CHOL 141 03/05/2016   TRIG 125 03/05/2016   HDL 29 (L) 03/05/2016   LDLCALC 87 03/05/2016   ALT 23 03/17/2016   AST 21 03/17/2016   NA 140 03/17/2016   K 4.1 03/17/2016   CL 104 03/17/2016   CREATININE 1.14 03/17/2016   BUN 19 03/17/2016   CO2 28 03/17/2016   TSH 4.78 (H) 03/17/2016   PSA 3.13 03/17/2016   HGBA1C 6.1 03/17/2016    Assessment & Plan:   [name] was seen today for annual exam.  Diagnoses and all orders for this visit:  Hyperlipidemia LDL goal <130- He has achieved his LDL goal and is doing well on the statin -     Comprehensive metabolic panel; Future -     Thyroid Panel With TSH; Future  Vitamin D deficiency  CLL (chronic lymphocytic leukemia) ([place])- his white cell count is stable and he is asymptomatic, he will follow-up with oncology later this year -     Comprehensive metabolic panel; Future -     CBC with Differential/Platelet;  Future  TSH elevation- he has a mildly elevated TSH but other TFTs are normal, his TPO antibody is mildly positive indicating a prior history of Hashimoto's thyroiditis. His only symptom is weight gain and at his age his TSH level is not high enough to warrant thyroid replacement therapy at this time. -     Thyroid Panel With TSH; Future -     Thyroid peroxidase antibody; Future  Benign prostatic hyperplasia without lower urinary tract symptoms- his PSA is not significantly elevated so I am not concerned about prostate cancer, he has no symptoms that need to be treated. -     PSA; Future -     Urinalysis, Routine w reflex microscopic; Future  Blood glucose elevated- his A1c is 6.1%, he is prediabetic, no medications are needed at this time. -     Hemoglobin A1c; Future  Routine general medical examination at a health care facility   I am having Mr. Ganson maintain his aspirin, Multiple Vitamins-Minerals (CENTRUM SILVER PO), Ibuprofen, and atorvastatin.  No orders of the defined types were placed in this encounter.  See AVS for instructions about  healthy living and anticipatory guidance.  Follow-up: Return if symptoms worsen or fail to improve.  Scarlette Calico, MD

## 2016-03-17 NOTE — Telephone Encounter (Signed)
Recd call from lori/lab----critical lab result, WBC count is 34.7----dr jones advised

## 2016-03-17 NOTE — Patient Instructions (Signed)
 Health Maintenance, Male A healthy lifestyle and preventive care is important for your health and wellness. Ask your health care provider about what schedule of regular examinations is right for you. What should I know about weight and diet?  Eat a Healthy Diet  Eat plenty of vegetables, fruits, whole grains, low-fat dairy products, and lean protein.  Do not eat a lot of foods high in solid fats, added sugars, or salt. Maintain a Healthy Weight  Regular exercise can help you achieve or maintain a healthy weight. You should:  Do at least 150 minutes of exercise each week. The exercise should increase your heart rate and make you sweat (moderate-intensity exercise).  Do strength-training exercises at least twice a week. Watch Your Levels of Cholesterol and Blood Lipids  Have your blood tested for lipids and cholesterol every 5 years starting at 79 years of age. If you are at high risk for heart disease, you should start having your blood tested when you are 79 years old. You may need to have your cholesterol levels checked more often if:  Your lipid or cholesterol levels are high.  You are older than 79 years of age.  You are at high risk for heart disease. What should I know about cancer screening? Many types of cancers can be detected early and may often be prevented. Lung Cancer  You should be screened every year for lung cancer if:  You are a current smoker who has smoked for at least 30 years.  You are a former smoker who has quit within the past 15 years.  Talk to your health care provider about your screening options, when you should start screening, and how often you should be screened. Colorectal Cancer  Routine colorectal cancer screening usually begins at 79 years of age and should be repeated every 5-10 years until you are 79 years old. You may need to be screened more often if early forms of precancerous polyps or small growths are found. Your health care provider  may recommend screening at an earlier age if you have risk factors for colon cancer.  Your health care provider may recommend using home test kits to check for hidden blood in the stool.  A small camera at the end of a tube can be used to examine your colon (sigmoidoscopy or colonoscopy). This checks for the earliest forms of colorectal cancer. Prostate and Testicular Cancer  Depending on your age and overall health, your health care provider may do certain tests to screen for prostate and testicular cancer.  Talk to your health care provider about any symptoms or concerns you have about testicular or prostate cancer. Skin Cancer  Check your skin from head to toe regularly.  Tell your health care provider about any new moles or changes in moles, especially if:  There is a change in a mole's size, shape, or color.  You have a mole that is larger than a pencil eraser.  Always use sunscreen. Apply sunscreen liberally and repeat throughout the day.  Protect yourself by wearing long sleeves, pants, a wide-brimmed hat, and sunglasses when outside. What should I know about heart disease, diabetes, and high blood pressure?  If you are 18-39 years of age, have your blood pressure checked every 3-5 years. If you are 40 years of age or older, have your blood pressure checked every year. You should have your blood pressure measured twice-once when you are at a hospital or clinic, and once when you are not at   a hospital or clinic. Record the average of the two measurements. To check your blood pressure when you are not at a hospital or clinic, you can use:  An automated blood pressure machine at a pharmacy.  A home blood pressure monitor.  Talk to your health care provider about your target blood pressure.  If you are between 45-79 years old, ask your health care provider if you should take aspirin to prevent heart disease.  Have regular diabetes screenings by checking your fasting blood sugar  level.  If you are at a normal weight and have a low risk for diabetes, have this test once every three years after the age of 45.  If you are overweight and have a high risk for diabetes, consider being tested at a younger age or more often.  A one-time screening for abdominal aortic aneurysm (AAA) by ultrasound is recommended for men aged 65-75 years who are current or former smokers. What should I know about preventing infection? Hepatitis B  If you have a higher risk for hepatitis B, you should be screened for this virus. Talk with your health care provider to find out if you are at risk for hepatitis B infection. Hepatitis C  Blood testing is recommended for:  Everyone born from 1945 through 1965.  Anyone with known risk factors for hepatitis C. Sexually Transmitted Diseases (STDs)  You should be screened each year for STDs including gonorrhea and chlamydia if:  You are sexually active and are younger than 79 years of age.  You are older than 79 years of age and your health care provider tells you that you are at risk for this type of infection.  Your sexual activity has changed since you were last screened and you are at an increased risk for chlamydia or gonorrhea. Ask your health care provider if you are at risk.  Talk with your health care provider about whether you are at high risk of being infected with HIV. Your health care provider may recommend a prescription medicine to help prevent HIV infection. What else can I do?  Schedule regular health, dental, and eye exams.  Stay current with your vaccines (immunizations).  Do not use any tobacco products, such as cigarettes, chewing tobacco, and e-cigarettes. If you need help quitting, ask your health care provider.  Limit alcohol intake to no more than 2 drinks per day. One drink equals 12 ounces of beer, 5 ounces of wine, or 1 ounces of hard liquor.  Do not use street drugs.  Do not share needles.  Ask your health  care provider for help if you need support or information about quitting drugs.  Tell your health care provider if you often feel depressed.  Tell your health care provider if you have ever been abused or do not feel safe at home. This information is not intended to replace advice given to you by your health care provider. Make sure you discuss any questions you have with your health care provider. Document Released: 06/28/2007 Document Revised: 08/29/2015 Document Reviewed: 10/03/2014 Elsevier Interactive Patient Education  2017 Elsevier Inc.  

## 2016-03-17 NOTE — Progress Notes (Signed)
Pre-visit discussion using our clinic review tool. No additional management support is needed unless otherwise documented below in the visit note.  

## 2016-03-18 ENCOUNTER — Encounter: Payer: Self-pay | Admitting: Internal Medicine

## 2016-03-18 LAB — THYROID PEROXIDASE ANTIBODY: THYROID PEROXIDASE ANTIBODY: 13 [IU]/mL — AB (ref <9)

## 2016-03-19 DIAGNOSIS — R7303 Prediabetes: Secondary | ICD-10-CM | POA: Insufficient documentation

## 2016-03-19 DIAGNOSIS — Z0000 Encounter for general adult medical examination without abnormal findings: Secondary | ICD-10-CM | POA: Insufficient documentation

## 2016-03-19 NOTE — Assessment & Plan Note (Signed)

## 2016-03-31 ENCOUNTER — Other Ambulatory Visit: Payer: Self-pay | Admitting: Dermatology

## 2016-05-09 ENCOUNTER — Other Ambulatory Visit: Payer: Self-pay | Admitting: Cardiovascular Disease

## 2016-07-09 ENCOUNTER — Other Ambulatory Visit: Payer: Self-pay | Admitting: Dermatology

## 2016-08-10 ENCOUNTER — Other Ambulatory Visit: Payer: Self-pay | Admitting: Cardiovascular Disease

## 2016-08-12 ENCOUNTER — Other Ambulatory Visit: Payer: Medicare Other

## 2016-08-12 ENCOUNTER — Telehealth: Payer: Self-pay | Admitting: Cardiovascular Disease

## 2016-08-12 DIAGNOSIS — Z79899 Other long term (current) drug therapy: Secondary | ICD-10-CM

## 2016-08-12 LAB — HEPATIC FUNCTION PANEL
ALK PHOS: 83 IU/L (ref 39–117)
ALT: 21 IU/L (ref 0–44)
AST: 23 IU/L (ref 0–40)
Albumin: 3.5 g/dL (ref 3.5–4.8)
BILIRUBIN, DIRECT: 0.14 mg/dL (ref 0.00–0.40)
Bilirubin Total: 0.5 mg/dL (ref 0.0–1.2)
Total Protein: 5.7 g/dL — ABNORMAL LOW (ref 6.0–8.5)

## 2016-08-12 LAB — LIPID PANEL
CHOLESTEROL TOTAL: 134 mg/dL (ref 100–199)
Chol/HDL Ratio: 4.8 ratio (ref 0.0–5.0)
HDL: 28 mg/dL — ABNORMAL LOW (ref >39)
LDL Calculated: 81 mg/dL (ref 0–99)
Triglycerides: 126 mg/dL (ref 0–149)
VLDL Cholesterol Cal: 25 mg/dL (ref 5–40)

## 2016-08-12 NOTE — Telephone Encounter (Signed)
NEW MESSAGE   *STAT* If patient is at the pharmacy, call can be transferred to refill team.   1. Which medications need to be refilled? (please list name of each medication and dose if known)  ATORVASTATIN 20MG   2. Which pharmacy/location (including street and city if local pharmacy) is medication to be sent to? [place]  3. Do they need a 30 day or 90 day supply?  PT WOULD LIKE A YEAR SUPPLY

## 2016-08-12 NOTE — Telephone Encounter (Signed)
I called pt to inform him that his medication of Atorvastatin 20 mg tablet was sent to his pharmacy on 08/11/16, 90 day supply, until his appointment in August and after then he would be able to get a year supply. I advised the pt that if he has any other problems, questions or concerns to call the office. Pt verbalized understanding.

## 2016-08-13 ENCOUNTER — Telehealth: Payer: Self-pay

## 2016-08-13 DIAGNOSIS — E785 Hyperlipidemia, unspecified: Secondary | ICD-10-CM

## 2016-08-13 DIAGNOSIS — Z79899 Other long term (current) drug therapy: Secondary | ICD-10-CM

## 2016-08-13 NOTE — Telephone Encounter (Signed)
Patient aware of results. Patient will come in on 02/17/17.

## 2016-08-13 NOTE — Telephone Encounter (Signed)
-----   Message from Josue Hector, MD sent at 08/12/2016  4:31 PM EDT ----- Cholesterol is at goal and LFT's are normal F/U labs in 6 months

## 2016-08-19 NOTE — Progress Notes (Signed)
Patient ID: Francisco Larson, male   DOB: [birth year], 81 y.o.   MRN: [id number]     Cardiology Office Note   Date:  08/19/2016   ID:  Francisco Larson, DOB Mar 14, 1935, MRN [id number]  PCP:  Janith Lima, MD  Cardiologist:   Jenkins Rouge, MD   No chief complaint on file.     History of Present Illness: Francisco Larson is a 81 y.o. male who presents for PAC;s on ECG during routine medical exam   Has a history of elevated lipids on statin.  CLL.    Labs 03/17/16 WBC 34.7 PLT 174 Hct 49.4    Last Rx CLL 2003  Been at this count for last 5 years.    Father and bot grandfathers with premature CAD and MI's in 67's  Had seen Dr Daneen Schick a couple of times for "prevention"  Has had normal ETTls  And echo's  He is active goes to [employer] 4-5x/week and [employer].  No chest pain , dyspnea palpitations.  Takes his statin and LDL good at 117   Calcium Score:  03/13/14 IMPRESSION: Coronary calcium score of 285. This was 71 st percentile for age and sex matched control.  Duplex 03/26/15 less than 50% bilateral ICA stenosis   Activity limited by chronic left knee pain and torn ACL.  Eating too many carbs  Nice trip to [place] and [place] Son lives in [place] does rafting tours Daughter lives in [place]  No cardiac complaints   Past Medical History:  Diagnosis Date  . Arthritis   . Hyperlipidemia   . Leukemia, chronic lymphocytic ([place])   . Shingles   . Vitamin D deficiency     Past Surgical History:  Procedure Laterality Date  . EYE SURGERY    . LAPAROSCOPIC TRANS ANAL AND TRANSABDOMINAL RECTAL RESECTION WITH COLOANAL ANASTOMOSIS    . SURGERY OF LIP     tumor  . TONSILLECTOMY    . VASECTOMY       Current Outpatient Prescriptions  Medication Sig Dispense Refill  . aspirin 81 MG tablet Take 81 mg by mouth daily.    Marland Kitchen atorvastatin (LIPITOR) 20 MG tablet TAKE 1 TABLET(20 MG) BY MOUTH DAILY 90 tablet 0  . Ibuprofen 200 MG CAPS Take 2 capsules by mouth daily as  needed (pain).     . Multiple Vitamins-Minerals (CENTRUM SILVER PO) Take 1 tablet by mouth daily.      No current facility-administered medications for this visit.     Allergies:   Z-pak [azithromycin]    Social History:  The patient  reports that he has never smoked. He has never used smokeless tobacco. He reports that he does not drink alcohol or use drugs.   Family History:  The patient's family history includes Asthma in his sister; COPD in his maternal grandmother; Cancer in his mother; Colon cancer in his sister; Diabetes in his paternal grandmother; Emphysema in his mother; Heart disease in his father; Hyperlipidemia in his sister; Mental illness in his paternal grandmother; Stroke in his maternal grandfather and sister.    ROS:  Please see the history of present illness.   Otherwise, review of systems are positive for none.   All other systems are reviewed and negative.    PHYSICAL EXAM: VS:  There were no vitals taken for this visit. , BMI There is no height or weight on file to calculate BMI. Affect appropriate Healthy:  appears stated age 78: normal Neck supple with no  adenopathy JVP normal left  bruits no thyromegaly Lungs clear with no wheezing and good diaphragmatic motion Heart:  S1/S2 1/6 SEM  murmur, no rub, gallop or click PMI normal Abdomen: benighn, BS positve, no tenderness, no AAA no bruit.  No HSM or HJR Distal pulses intact with no bruits No edema Neuro non-focal Skin warm and dry No muscular weakness    EKG:   02/28/14 SR rate 59  Read as PAC but artifact on transition from V1-3 to V4-6 leads  No change from 2/15  Rate was 69 then  And 02/2012  Rate 59 then  05/16/15  SR rate 70  Normal ECG    Recent Labs: 03/17/2016: BUN 19; Creatinine, Ser 1.14; Hemoglobin 16.4; Platelets 174.0; Potassium 4.1; Sodium 140; TSH 4.78 08/12/2016: ALT 21    Lipid Panel    Component Value Date/Time   CHOL 134 08/12/2016 0829   TRIG 126 08/12/2016 0829   HDL 28 (L)  08/12/2016 0829   CHOLHDL 4.8 08/12/2016 0829   CHOLHDL 4.4 08/23/2015 0754   VLDL 23 08/23/2015 0754   LDLCALC 81 08/12/2016 0829      Wt Readings from Last 3 Encounters:  03/17/16 215 lb 4 oz (97.6 kg)  09/18/15 211 lb 11.2 oz (96 kg)  08/28/15 212 lb 3.2 oz (96.3 kg)      Other studies Reviewed: Additional studies/ records that were reviewed today include: records Dr Everlene Farrier and [facility] office notes .    ASSESSMENT AND PLAN:  1. Family History CAD:  No indication for ETT Active and asymptomatic  Baseline ECG is normal .  Calcium score 61st percentile 2. Chol:  On statin LFTls ok  Lab Results  Component Value Date   LDLCALC 81 08/12/2016     3. CLL  WBC 32,000  Been like that for 5 years.  F/u oncology 4. Left bruit:  Duplex without significant stenosis ASA / Statin  F/u US 2 years March 2019    Current medicines are reviewed at length with the patient today.  The patient does not have concerns regarding medicines.  The following changes have been made:     Labs/ tests ordered today include:  Chol/ Liver     No orders of the defined types were placed in this encounter.    Disposition:   FU with  Me a year     Signed, Jenkins Rouge, MD  08/19/2016 1:15 PM    [facility] [address], [place], [place]  [address] Phone: [phone]; Fax: [phone]

## 2016-08-20 ENCOUNTER — Ambulatory Visit (INDEPENDENT_AMBULATORY_CARE_PROVIDER_SITE_OTHER): Payer: Medicare Other | Admitting: Cardiovascular Disease

## 2016-08-20 DIAGNOSIS — E785 Hyperlipidemia, unspecified: Secondary | ICD-10-CM | POA: Diagnosis not present

## 2016-08-20 NOTE — Patient Instructions (Signed)
Medication Instructions:  Your physician recommends that you continue on your current medications as directed. Please refer to the Current Medication list given to you today.   Labwork: None ordered  Testing/Procedures: None ordered  Follow-Up: Your physician wants you to follow-up in: 1 year with Dr. Nishan. You will receive a reminder letter in the mail two months in advance. If you don't receive a letter, please call our office to schedule the follow-up appointment.   Any Other Special Instructions Will Be Listed Below (If Applicable).     If you need a refill on your cardiac medications before your next appointment, please call your pharmacy.   

## 2016-08-20 NOTE — Addendum Note (Signed)
Addended by: Drue Novel I on: 08/20/2016 05:13 PM   Modules accepted: Orders

## 2016-08-27 ENCOUNTER — Other Ambulatory Visit: Payer: Medicare Other

## 2016-09-10 ENCOUNTER — Other Ambulatory Visit: Payer: Self-pay | Admitting: *Deleted

## 2016-09-10 ENCOUNTER — Other Ambulatory Visit (HOSPITAL_BASED_OUTPATIENT_CLINIC_OR_DEPARTMENT_OTHER): Payer: Medicare Other

## 2016-09-10 DIAGNOSIS — C9110 Chronic lymphocytic leukemia of B-cell type not having achieved remission: Secondary | ICD-10-CM | POA: Diagnosis not present

## 2016-09-10 LAB — CBC WITH DIFFERENTIAL/PLATELET
BASO%: 0.3 % (ref 0.0–2.0)
Basophils Absolute: 0.1 10*3/uL (ref 0.0–0.1)
EOS%: 1 % (ref 0.0–7.0)
Eosinophils Absolute: 0.3 10*3/uL (ref 0.0–0.5)
HCT: 48.3 % (ref 38.4–49.9)
HGB: 15.8 g/dL (ref 13.0–17.1)
LYMPH%: 85.9 % — AB (ref 14.0–49.0)
MCH: 29.8 pg (ref 27.2–33.4)
MCHC: 32.7 g/dL (ref 32.0–36.0)
MCV: 91.2 fL (ref 79.3–98.0)
MONO#: 0.9 10*3/uL (ref 0.1–0.9)
MONO%: 2.5 % (ref 0.0–14.0)
NEUT#: 3.6 10*3/uL (ref 1.5–6.5)
NEUT%: 10.3 % — AB (ref 39.0–75.0)
Platelets: 160 10*3/uL (ref 140–400)
RBC: 5.3 10*6/uL (ref 4.20–5.82)
RDW: 14.1 % (ref 11.0–14.6)
WBC: 34.5 10*3/uL — ABNORMAL HIGH (ref 4.0–10.3)
lymph#: 29.7 10*3/uL — ABNORMAL HIGH (ref 0.9–3.3)

## 2016-09-10 LAB — COMPREHENSIVE METABOLIC PANEL
ALT: 23 U/L (ref 0–55)
AST: 25 U/L (ref 5–34)
Albumin: 3.1 g/dL — ABNORMAL LOW (ref 3.5–5.0)
Alkaline Phosphatase: 80 U/L (ref 40–150)
Anion Gap: 4 mEq/L (ref 3–11)
BUN: 17.3 mg/dL (ref 7.0–26.0)
CHLORIDE: 107 meq/L (ref 98–109)
CO2: 26 meq/L (ref 22–29)
CREATININE: 1.1 mg/dL (ref 0.7–1.3)
Calcium: 8.9 mg/dL (ref 8.4–10.4)
EGFR: 66 mL/min/{1.73_m2} — ABNORMAL LOW (ref >90)
Glucose: 118 mg/dl (ref 70–140)
Potassium: 4.6 mEq/L (ref 3.5–5.1)
SODIUM: 137 meq/L (ref 136–145)
Total Bilirubin: 0.62 mg/dL (ref 0.20–1.20)
Total Protein: 6 g/dL — ABNORMAL LOW (ref 6.4–8.3)

## 2016-09-10 LAB — TECHNOLOGIST REVIEW

## 2016-09-10 LAB — LACTATE DEHYDROGENASE: LDH: 161 U/L (ref 125–245)

## 2016-09-11 ENCOUNTER — Other Ambulatory Visit: Payer: Self-pay | Admitting: *Deleted

## 2016-09-11 DIAGNOSIS — C9110 Chronic lymphocytic leukemia of B-cell type not having achieved remission: Secondary | ICD-10-CM

## 2016-09-11 LAB — BETA 2 MICROGLOBULIN, SERUM: Beta-2: 2.9 mg/L — ABNORMAL HIGH (ref 0.6–2.4)

## 2016-09-17 ENCOUNTER — Ambulatory Visit (HOSPITAL_BASED_OUTPATIENT_CLINIC_OR_DEPARTMENT_OTHER): Payer: Medicare Other | Admitting: Oncology

## 2016-09-17 VITALS — BP 156/68 | HR 63 | Temp 98.0°F | Resp 18 | Ht 71.0 in | Wt 213.9 lb

## 2016-09-17 DIAGNOSIS — C9110 Chronic lymphocytic leukemia of B-cell type not having achieved remission: Secondary | ICD-10-CM | POA: Diagnosis not present

## 2016-09-17 NOTE — Progress Notes (Signed)
ID: Francisco Larson   DOB: Dec 28, 1935  MR#: [id number]  MBT#:[id number]  PCP: Nena Jordan MD SU: Alphonsa Overall MD OTHER MD: Daneen Schick, [name]    INTERVAL HISTORY: Francisco Larson returns today for follow-up of his chronic lymphoid leukemia. He has been off treatment now for 14 years. He denies unexplained weight loss, unexplained fatigue, fever, rash, pruritus, drenching sweats, or any adenopathy that he is aware of.  His absolute lymphocyte count remains stable  Results for PAYNE, GARSKE (MRN [id number]) as of 09/17/2016 16:00  Ref. Range 01/27/2012 08:47 05/26/2012 08:04 08/04/2012 08:20 11/22/2012 08:30 02/22/2013 16:26 04/25/2013 09:16 07/27/2013 09:37 10/25/2013 08:24 02/28/2014 10:36 09/05/2014 08:13 03/15/2015 08:07 09/11/2015 07:57 03/17/2016 09:36 09/10/2016 10:24  Lymphocyte # Latest Ref Range: 0.7 - 4.0 K/uL 27.9 (H)    32.8 (H)    28.4 (H)  26.9 (H)  30.0 (H)     REVIEW OF SYSTEMS: He and his wife just one on medications through the [facility]. He enjoys walking although he does have numbness in his feet, right greater than left and occasional cramps. He has some urinary incontinence at times, and some arthritis here and there which is not more intense or persistent than before. A detailed review of systems today was otherwise stable  PAST MEDICAL HISTORY: Past Medical History:  Diagnosis Date  . Arthritis   . Hyperlipidemia   . Leukemia, chronic lymphocytic ([place])   . Shingles   . Vitamin D deficiency     PAST SURGICAL HISTORY: Past Surgical History:  Procedure Laterality Date  . EYE SURGERY    . LAPAROSCOPIC TRANS ANAL AND TRANSABDOMINAL RECTAL RESECTION WITH COLOANAL ANASTOMOSIS    . SURGERY OF LIP     tumor  . TONSILLECTOMY    . VASECTOMY      FAMILY HISTORY Family History  Problem Relation Age of Onset  . Colon cancer Sister   . Hyperlipidemia Sister   . Stroke Sister   . Heart disease Father   . Emphysema Mother        never smoker, but exposed to 2nd hand  from spouse  . Cancer Mother   . COPD Maternal Grandmother        never smoker  . Stroke Maternal Grandfather   . Diabetes Paternal Grandmother   . Mental illness Paternal Grandmother   . Asthma Sister    The patient's mother died at the age of 45. The patient's father died at the age of 48 from heart disease. He had no brothers. His only sister, currently 2 years old, has a history of late breast cancer and late middle-aged colon cancer.  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 His been married 50+ years, and their children are [place] lives in [place] and has 4 children, and [place] who lives in [place] and has no children.  Francisco Larson is an elder at [employer]  ADVANCED DIRECTIVES: in place  HEALTH MAINTENANCE: Social History  Substance Use Topics  . Smoking status: Never Smoker  . Smokeless tobacco: Never Used  . Alcohol use No     Colonoscopy:10/18/2014  PSA: 3.13 03/17/2016  Lipid panel:  Allergies  Allergen Reactions  . Z-Pak [Azithromycin] Other (See Comments)    Pt stated he thinks this made him dizzy    Current Outpatient Prescriptions  Medication Sig Dispense Refill  . aspirin 81 MG tablet Take 81 mg by mouth daily.    Marland Kitchen  atorvastatin (LIPITOR) 20 MG tablet TAKE 1 TABLET(20 MG) BY MOUTH DAILY 90 tablet 0  . Ibuprofen 200 MG CAPS Take 2 capsules by mouth daily as needed (pain).     . Multiple Vitamins-Minerals (CENTRUM SILVER PO) Take 1 tablet by mouth daily.      No current facility-administered medications for this visit.     OBJECTIVE: Middle-aged white man Appears younger than stated age   81:   09/17/16 1152  BP: (!) 156/68  Pulse: 63  Resp: 18  Temp: 98 F (36.7 C)  SpO2: 98%     Body mass index is 29.83 kg/m.    ECOG FS: 0  Sclerae unicteric, EOMs intact Oropharynx clear and moist No cervical or supraclavicular adenopathy Lungs no rales or  rhonchi Heart regular rate and rhythm Abd soft, nontender, positive bowel sounds MSK no focal spinal tenderness, no upper extremity lymphedema Neuro: nonfocal, well oriented, appropriate affect Breasts: Deferred   LAB:  Lab Results  Component Value Date   WBC 34.5 (H) 09/10/2016   NEUTROABS 3.6 09/10/2016   HGB 15.8 09/10/2016   HCT 48.3 09/10/2016   MCV 91.2 09/10/2016   PLT 160 09/10/2016      Chemistry      Component Value Date/Time   NA 137 09/10/2016 1024   K 4.6 09/10/2016 1024   CL 104 03/17/2016 0936   CL 108 (H) 05/26/2012 0804   CO2 26 09/10/2016 1024   BUN 17.3 09/10/2016 1024   CREATININE 1.1 09/10/2016 1024      Component Value Date/Time   CALCIUM 8.9 09/10/2016 1024   ALKPHOS 80 09/10/2016 1024   AST 25 09/10/2016 1024   ALT 23 09/10/2016 1024   BILITOT 0.62 09/10/2016 1024      No results found for: LABCA2  No components found for: FHLKT625  No results for input(s): INR in the last 168 hours.  Urinalysis    Component Value Date/Time   COLORURINE YELLOW 03/17/2016 0936    STUDIES: No results found.  ASSESSMENT: 81 y.o. 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   PLAN:  Francisco Larson Is now 81 years from initial diagnosis of chronic lymphoid leukemia at he has been off treatment for 14 years.  His counts remain stable. He has no "B" symptoms, no anemia, no thrombocytopenia, and no palpable adenopathy.  Accordingly we are continuing off treatment. He already has lab work through Dr. Roosvelt Harps twice a year after Dr. Ronnald Ramp at least yearly. We will check labs once a year, shortly before his visit next year, which will be in November.  Of course he is aware of which symptoms or concerns would make him a call so I could see him earlier than that date  At this point I'm delighted that  he is doing so well. He will turn 81 next year with an excellent functional status.   Lizania Bouchard C    09/17/2016

## 2016-10-02 IMAGING — US US CAROTID DUPLEX BILAT
1 series · 13 of 24 positions shown · non-contrast
Comparison: None.

CLINICAL DATA: Left carotid bruit.  Hyperlipidemia.

EXAM:
BILATERAL CAROTID DUPLEX ULTRASOUND
TECHNIQUE: Gray scale imaging, color Doppler and duplex ultrasound was
performed of bilateral carotid and vertebral arteries in the neck.

[Series 1: us carotid duplex bilat · 0.07mm/px · 13 of 54 slices shown]
[im 1/54]
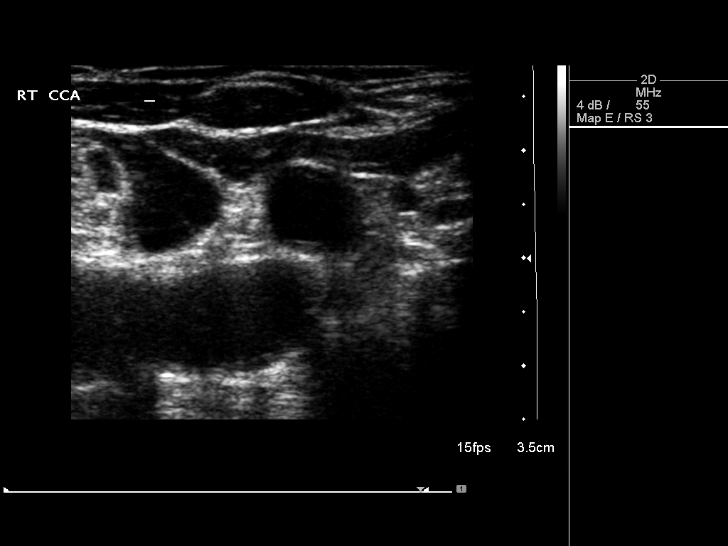
[im 5/54]
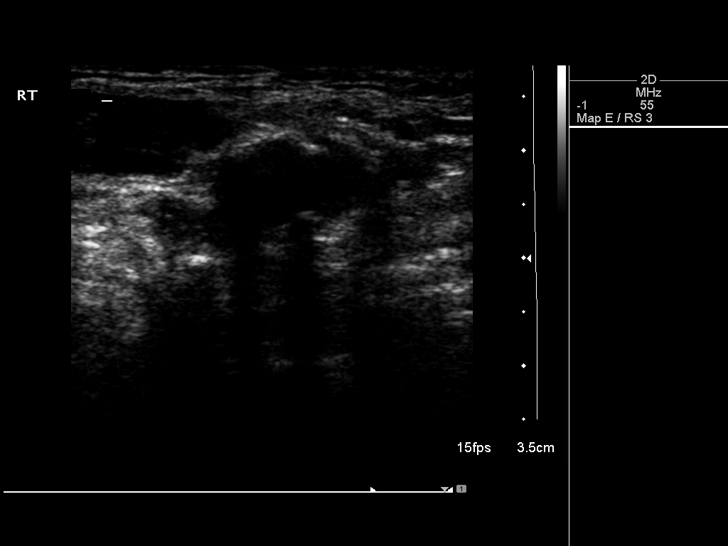
[im 10/54]
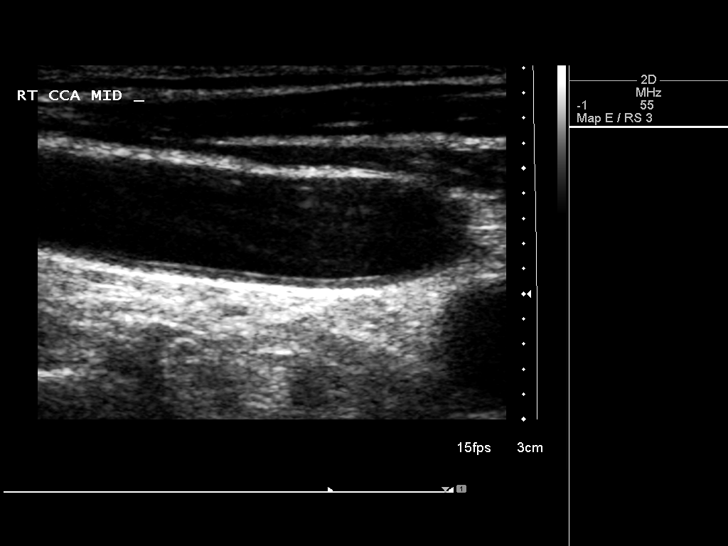
[im 14/54]
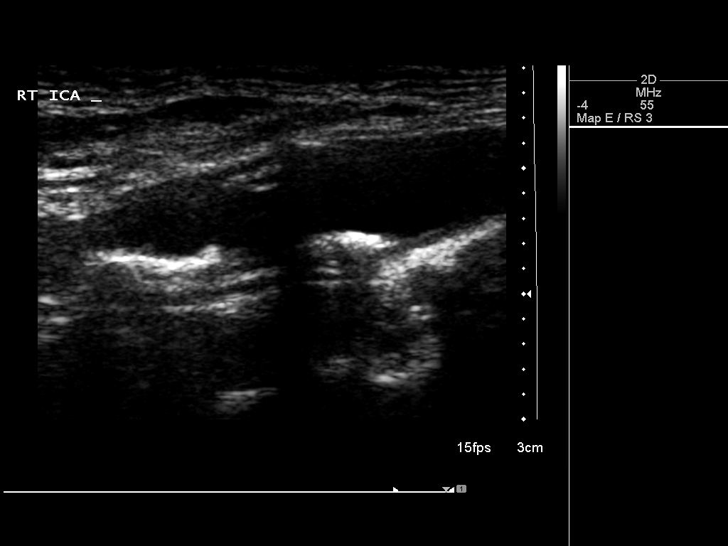
[im 19/54]
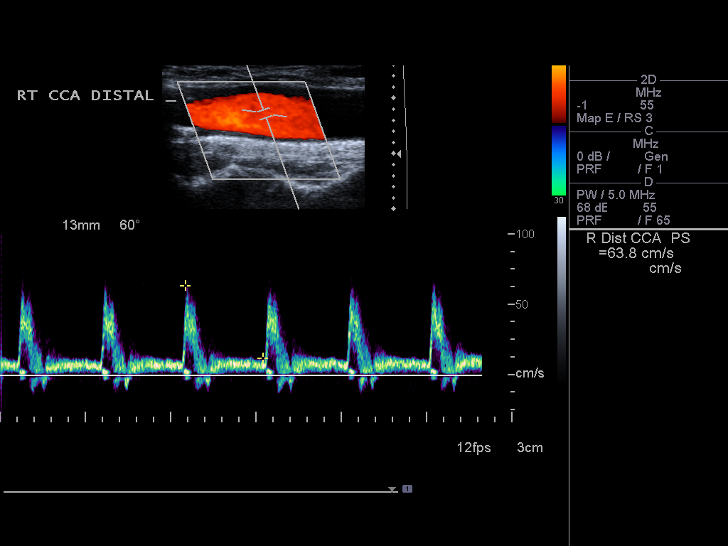
[im 24/54]
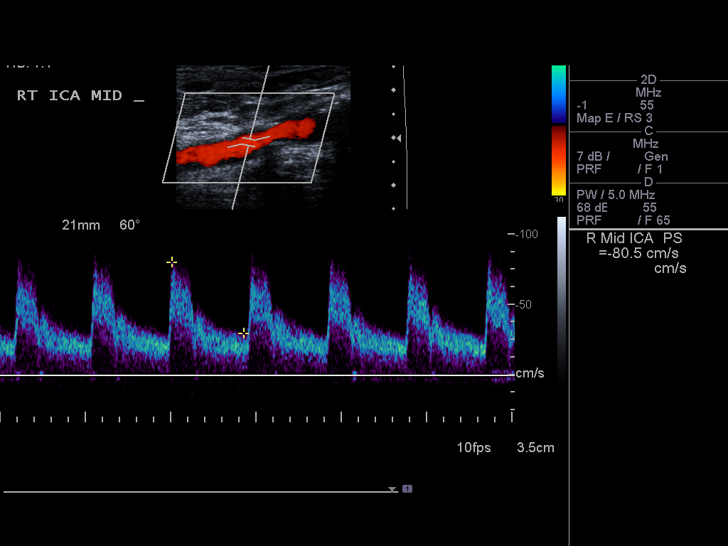
[im 28/54]
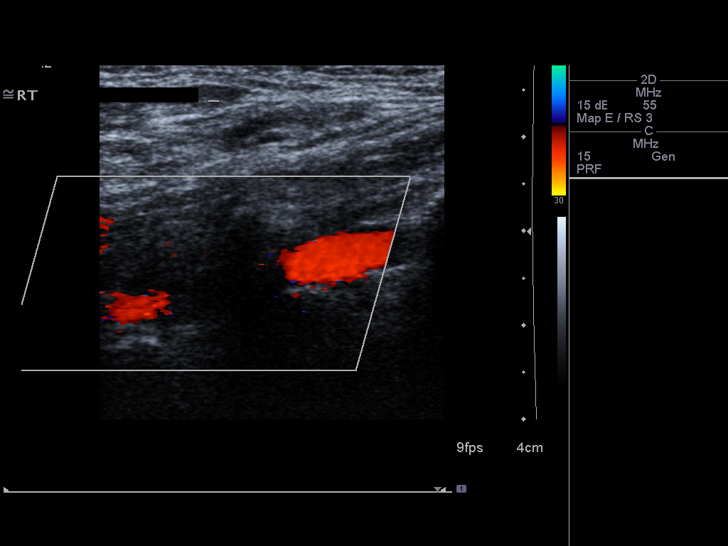
[im 30/54]
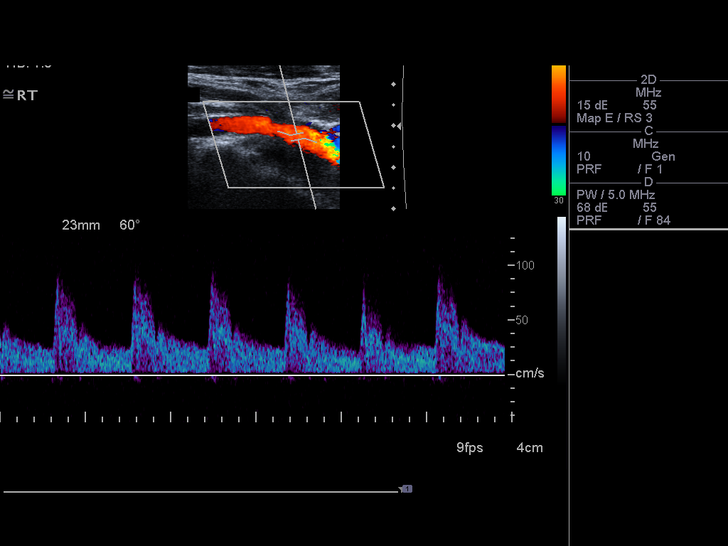
[im 35/54]
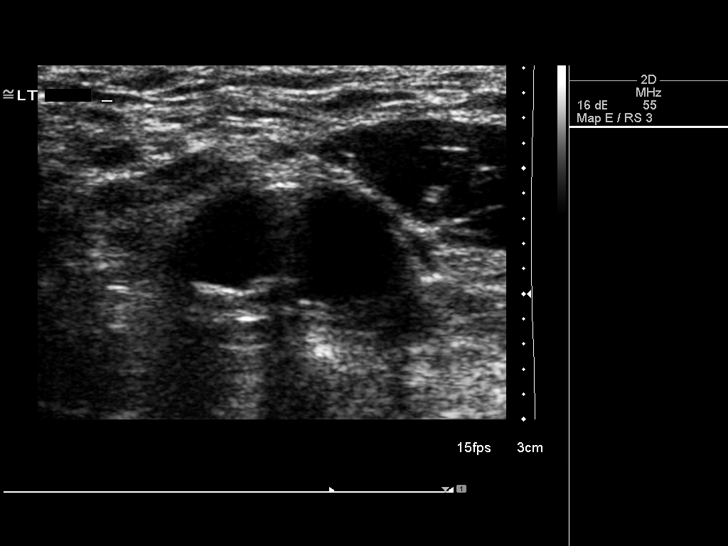
[im 40/54]
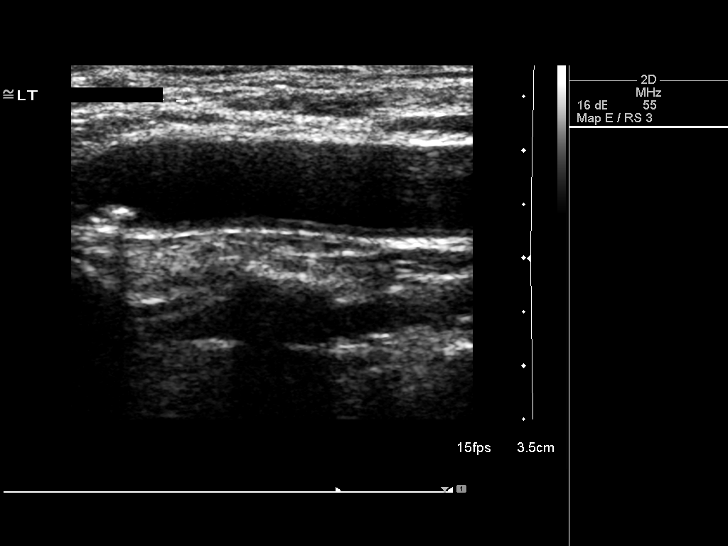
[im 44/54]
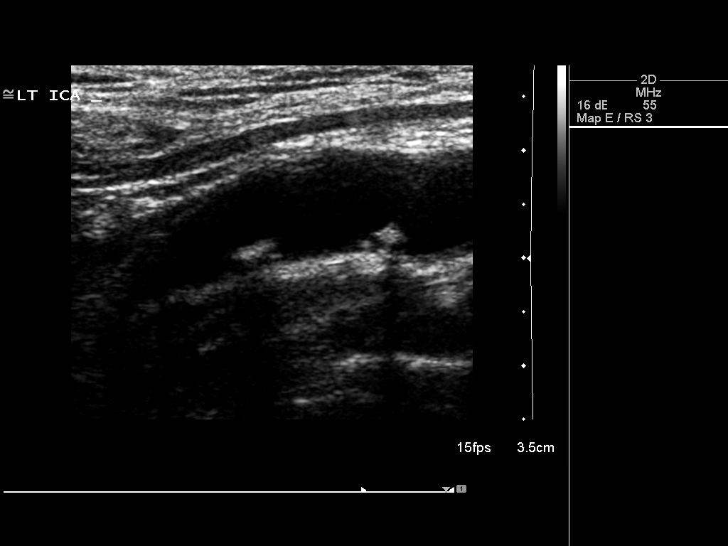
[im 49/54]
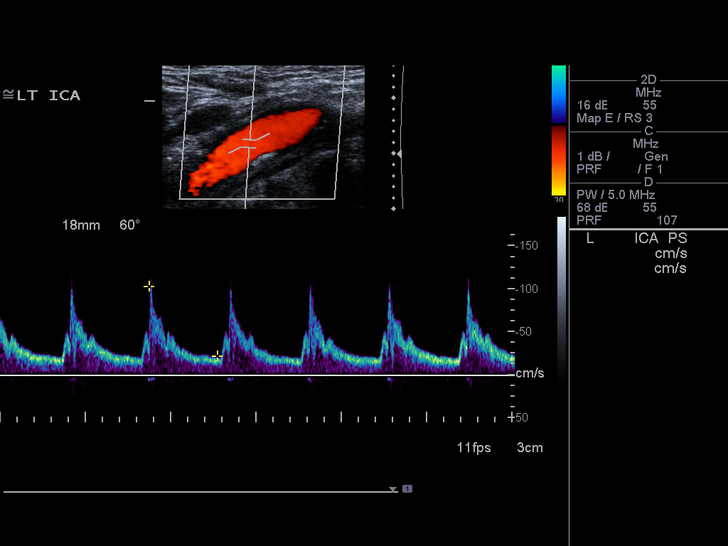
[im 54/54]
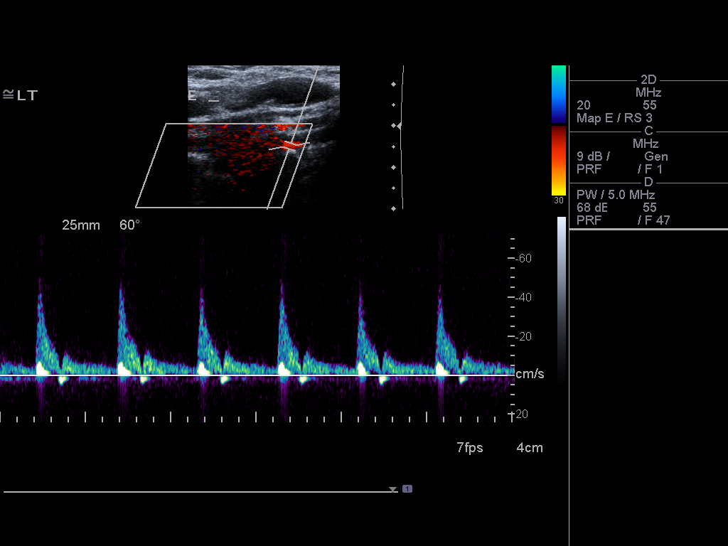

[13 of 24 positions shown; findings below may reference images not displayed]

REVIEW OF SYSTEMS:
Quantification of carotid stenosis is based on velocity parameters
that correlate the residual internal carotid diameter with
NASCET-based stenosis levels, using the diameter of the distal
internal carotid lumen as the denominator for stenosis measurement.

The following velocity measurements were obtained:

PEAK SYSTOLIC/END DIASTOLIC

RIGHT

ICA:                     81/30cm/sec

CCA:                     106/16cm/sec

SYSTOLIC ICA/CCA RATIO:

DIASTOLIC ICA/CCA RATIO:

ECA:                     154cm/sec

LEFT

ICA:                     104/22cm/sec

CCA:                     105/22cm/sec

SYSTOLIC ICA/CCA RATIO:

DIASTOLIC ICA/CCA RATIO:

ECA:                     146cm/sec
FINDINGS: RIGHT CAROTID ARTERY: Mild partially calcified plaque in the carotid
bulb extending into proximal internal and external carotid arteries
without high-grade stenosis. Normal waveforms and color Doppler
signal.

RIGHT VERTEBRAL ARTERY:  Normal flow direction and waveform.

LEFT CAROTID ARTERY: Eccentric partially calcified plaque in the
carotid bulb and proximal ICA. No high-grade stenosis. Normal
waveforms and color Doppler signal.

LEFT VERTEBRAL ARTERY: Normal flow direction and waveform.
IMPRESSION: 1. Bilateral carotid bifurcation and proximal ICA plaque, resulting
in less than 50% diameter stenosis. The exam does not exclude plaque
ulceration or embolization. Continued surveillance recommended.

## 2016-11-05 ENCOUNTER — Ambulatory Visit (INDEPENDENT_AMBULATORY_CARE_PROVIDER_SITE_OTHER): Payer: Medicare Other

## 2016-11-05 DIAGNOSIS — Z23 Encounter for immunization: Secondary | ICD-10-CM

## 2016-11-08 ENCOUNTER — Other Ambulatory Visit: Payer: Self-pay | Admitting: Cardiovascular Disease

## 2017-01-14 ENCOUNTER — Other Ambulatory Visit: Payer: Self-pay | Admitting: Dermatology

## 2017-02-17 ENCOUNTER — Other Ambulatory Visit: Payer: Medicare Other

## 2017-02-17 DIAGNOSIS — Z79899 Other long term (current) drug therapy: Secondary | ICD-10-CM

## 2017-02-17 DIAGNOSIS — E785 Hyperlipidemia, unspecified: Secondary | ICD-10-CM

## 2017-02-17 LAB — HEPATIC FUNCTION PANEL
ALBUMIN: 3.6 g/dL (ref 3.5–4.8)
ALK PHOS: 81 IU/L (ref 39–117)
ALT: 21 IU/L (ref 0–44)
AST: 24 IU/L (ref 0–40)
BILIRUBIN, DIRECT: 0.14 mg/dL (ref 0.00–0.40)
Bilirubin Total: 0.6 mg/dL (ref 0.0–1.2)
Total Protein: 5.9 g/dL — ABNORMAL LOW (ref 6.0–8.5)

## 2017-02-17 LAB — LIPID PANEL
CHOLESTEROL TOTAL: 136 mg/dL (ref 100–199)
Chol/HDL Ratio: 4.7 ratio (ref 0.0–5.0)
HDL: 29 mg/dL — AB (ref >39)
LDL Calculated: 82 mg/dL (ref 0–99)
Triglycerides: 124 mg/dL (ref 0–149)
VLDL CHOLESTEROL CAL: 25 mg/dL (ref 5–40)

## 2017-02-18 ENCOUNTER — Telehealth: Payer: Self-pay | Admitting: Cardiovascular Disease

## 2017-02-18 DIAGNOSIS — E785 Hyperlipidemia, unspecified: Secondary | ICD-10-CM

## 2017-02-18 NOTE — Telephone Encounter (Signed)
Called patient with lab results. Patient needs f/u lab work in 6 months  (August) patient is due for office visit as well in August. Will get lab work at that time.

## 2017-02-18 NOTE — Telephone Encounter (Signed)
New message   Pt returning phone call from 2/5

## 2017-03-18 ENCOUNTER — Encounter: Payer: Self-pay | Admitting: Internal Medicine

## 2017-03-18 ENCOUNTER — Other Ambulatory Visit (INDEPENDENT_AMBULATORY_CARE_PROVIDER_SITE_OTHER): Payer: Medicare Other

## 2017-03-18 ENCOUNTER — Ambulatory Visit (INDEPENDENT_AMBULATORY_CARE_PROVIDER_SITE_OTHER): Payer: Medicare Other | Admitting: Internal Medicine

## 2017-03-18 ENCOUNTER — Telehealth: Payer: Self-pay

## 2017-03-18 VITALS — BP 128/88 | HR 58 | Temp 97.5°F | Resp 16 | Ht 71.0 in | Wt 216.0 lb

## 2017-03-18 DIAGNOSIS — N400 Enlarged prostate without lower urinary tract symptoms: Secondary | ICD-10-CM | POA: Diagnosis not present

## 2017-03-18 DIAGNOSIS — R7989 Other specified abnormal findings of blood chemistry: Secondary | ICD-10-CM | POA: Diagnosis not present

## 2017-03-18 DIAGNOSIS — E785 Hyperlipidemia, unspecified: Secondary | ICD-10-CM

## 2017-03-18 DIAGNOSIS — B351 Tinea unguium: Secondary | ICD-10-CM

## 2017-03-18 DIAGNOSIS — R7303 Prediabetes: Secondary | ICD-10-CM | POA: Diagnosis not present

## 2017-03-18 DIAGNOSIS — Z0000 Encounter for general adult medical examination without abnormal findings: Secondary | ICD-10-CM | POA: Diagnosis not present

## 2017-03-18 DIAGNOSIS — C9190 Lymphoid leukemia, unspecified not having achieved remission: Secondary | ICD-10-CM | POA: Diagnosis not present

## 2017-03-18 DIAGNOSIS — Z23 Encounter for immunization: Secondary | ICD-10-CM | POA: Diagnosis not present

## 2017-03-18 DIAGNOSIS — C9110 Chronic lymphocytic leukemia of B-cell type not having achieved remission: Secondary | ICD-10-CM

## 2017-03-18 LAB — PSA: PSA: 3.68 ng/mL (ref 0.10–4.00)

## 2017-03-18 LAB — COMPREHENSIVE METABOLIC PANEL
ALBUMIN: 3.7 g/dL (ref 3.5–5.2)
ALT: 20 U/L (ref 0–53)
AST: 21 U/L (ref 0–37)
Alkaline Phosphatase: 76 U/L (ref 39–117)
BILIRUBIN TOTAL: 0.7 mg/dL (ref 0.2–1.2)
BUN: 14 mg/dL (ref 6–23)
CALCIUM: 9.3 mg/dL (ref 8.4–10.5)
CO2: 29 meq/L (ref 19–32)
CREATININE: 1.03 mg/dL (ref 0.40–1.50)
Chloride: 104 mEq/L (ref 96–112)
GFR: 73.92 mL/min (ref >60.00)
Glucose, Bld: 112 mg/dL — ABNORMAL HIGH (ref 70–99)
Potassium: 4.8 mEq/L (ref 3.5–5.1)
SODIUM: 139 meq/L (ref 135–145)
Total Protein: 6.2 g/dL (ref 6.0–8.3)

## 2017-03-18 LAB — CBC WITH DIFFERENTIAL/PLATELET
BASOS ABS: 0.2 10*3/uL — AB (ref 0.0–0.1)
Basophils Relative: 0.5 % (ref 0.0–3.0)
EOS ABS: 0.3 10*3/uL (ref 0.0–0.7)
Eosinophils Relative: 0.9 % (ref 0.0–5.0)
HEMATOCRIT: 48.3 % (ref 39.0–52.0)
Hemoglobin: 16.4 g/dL (ref 13.0–17.0)
Lymphs Abs: 28.4 10*3/uL — ABNORMAL HIGH (ref 0.7–4.0)
MCHC: 33.9 g/dL (ref 30.0–36.0)
MCV: 90.7 fl (ref 78.0–100.0)
MONO ABS: 0.9 10*3/uL (ref 0.1–1.0)
Monocytes Relative: 2.6 % — ABNORMAL LOW (ref 3.0–12.0)
NEUTROS ABS: 3.9 10*3/uL (ref 1.4–7.7)
Neutrophils Relative %: 11.5 % — ABNORMAL LOW (ref 43.0–77.0)
PLATELETS: 169 10*3/uL (ref 150.0–400.0)
RBC: 5.33 Mil/uL (ref 4.22–5.81)
RDW: 14.6 % (ref 11.5–15.5)

## 2017-03-18 LAB — LIPID PANEL
CHOLESTEROL: 135 mg/dL (ref 0–200)
HDL: 31.7 mg/dL — ABNORMAL LOW (ref >39.00)
LDL Cholesterol: 77 mg/dL (ref 0–99)
NonHDL: 102.82
TRIGLYCERIDES: 128 mg/dL (ref 0.0–149.0)
Total CHOL/HDL Ratio: 4
VLDL: 25.6 mg/dL (ref 0.0–40.0)

## 2017-03-18 LAB — HEMOGLOBIN A1C: HEMOGLOBIN A1C: 6 % (ref 4.6–6.5)

## 2017-03-18 NOTE — Telephone Encounter (Signed)
CRITICAL VALUE STICKER  CRITICAL VALUE: WBC 33.6  RECEIVER (on-site recipient of call): Bertha NOTIFIED: [phone] 10:57am  MESSENGER (representative from lab):Lauri/Lab  MD NOTIFIED: Scarlette Calico  TIME OF NOTIFICATION:[id number] 10:58am  RESPONSE: pending

## 2017-03-18 NOTE — Patient Instructions (Signed)

## 2017-03-18 NOTE — Progress Notes (Signed)
Subjective:  Patient ID: Francisco Larson, male    DOB: [birth year]  Age: 82 y.o. MRN: [id number]  CC: Annual Exam and Hyperlipidemia   HPI Francisco Larson presents for f/up - He complains about his toenails today.  He states that they have gotten long and uncomfortable.  He has a history of onychomycosis and wants something done about this.  He is doing well on the cholesterol medicine with no recent episodes of abdominal pain, muscle aches, or joint aches.  Past Medical History:  Diagnosis Date  . Arthritis   . Hyperlipidemia   . Leukemia, chronic lymphocytic ([place])   . Shingles   . Vitamin D deficiency    Past Surgical History:  Procedure Laterality Date  . EYE SURGERY    . LAPAROSCOPIC TRANS ANAL AND TRANSABDOMINAL RECTAL RESECTION WITH COLOANAL ANASTOMOSIS    . SURGERY OF LIP     tumor  . TONSILLECTOMY    . VASECTOMY      reports that  has never smoked. he has never used smokeless tobacco. He reports that he does not drink alcohol or use drugs. family history includes Asthma in his sister; COPD in his maternal grandmother; Cancer in his mother; Colon cancer in his sister; Diabetes in his paternal grandmother; Emphysema in his mother; Heart disease in his father; Hyperlipidemia in his sister; Mental illness in his paternal grandmother; Stroke in his maternal grandfather and sister. Allergies  Allergen Reactions  . Z-Pak [Azithromycin] Other (See Comments)    Pt stated he thinks this made him dizzy    Outpatient Medications Prior to Visit  Medication Sig Dispense Refill  . aspirin 81 MG tablet Take 81 mg by mouth daily.    Marland Kitchen atorvastatin (LIPITOR) 20 MG tablet TAKE 1 TABLET BY MOUTH DAILY 90 tablet 2  . Multiple Vitamins-Minerals (CENTRUM SILVER PO) Take 1 tablet by mouth daily.     . Ibuprofen 200 MG CAPS Take 2 capsules by mouth daily as needed (pain).     Marland Kitchen augmented betamethasone dipropionate (DIPROLENE-AF) 0.05 % cream APP AA QHS AFTER SHOWERING UTD  2   No  facility-administered medications prior to visit.     ROS Review of Systems  Constitutional: Negative.  Negative for appetite change, diaphoresis, fatigue and unexpected weight change.  HENT: Negative.  Negative for sinus pressure, sore throat and trouble swallowing.   Eyes: Negative.  Negative for visual disturbance.  Respiratory: Negative.  Negative for cough, chest tightness, shortness of breath and wheezing.   Cardiovascular: Negative for chest pain, palpitations and leg swelling.  Gastrointestinal: Negative.  Negative for abdominal pain, constipation, diarrhea, nausea and vomiting.  Endocrine: Negative.  Negative for cold intolerance and heat intolerance.  Genitourinary: Negative.  Negative for difficulty urinating.  Musculoskeletal: Negative.  Negative for arthralgias, back pain, myalgias and neck pain.  Skin: Negative for color change and pallor.  Allergic/Immunologic: Negative.   Neurological: Negative.  Negative for dizziness, weakness, light-headedness and headaches.  Hematological: Negative for adenopathy. Does not bruise/bleed easily.  Psychiatric/Behavioral: Negative.     Objective:  BP 128/88 (BP Location: Left Arm, Patient Position: Sitting, Cuff Size: Normal)   Pulse (!) 58   Temp (!) 97.5 F (36.4 C) (Oral)   Resp 16   Ht 5\' 11"  (1.803 m)   Wt 216 lb (98 kg)   SpO2 98%   BMI 30.13 kg/m   BP Readings from Last 3 Encounters:  03/18/17 128/88  09/17/16 (!) 156/68  03/17/16 122/84    Wt  Readings from Last 3 Encounters:  03/18/17 216 lb (98 kg)  09/17/16 213 lb 14.4 oz (97 kg)  03/17/16 215 lb 4 oz (97.6 kg)    Physical Exam  Constitutional: He is oriented to person, place, and time. No distress.  HENT:  Mouth/Throat: Oropharynx is clear and moist. No oropharyngeal exudate.  Eyes: Conjunctivae are normal. Left eye exhibits no discharge. No scleral icterus.  Neck: Normal range of motion. Neck supple. No JVD present. No thyromegaly present.    Cardiovascular: Normal rate, regular rhythm and normal heart sounds. Exam reveals no gallop and no friction rub.  No murmur heard. Pulmonary/Chest: Effort normal and breath sounds normal. No respiratory distress. He has no wheezes. He has no rales.  Abdominal: Soft. Bowel sounds are normal. He exhibits no distension and no mass. There is no tenderness. There is no guarding. Hernia confirmed negative in the right inguinal area and confirmed negative in the left inguinal area.  Genitourinary: Testes normal and penis normal. Rectal exam shows no external hemorrhoid, no internal hemorrhoid, no fissure, no mass, no tenderness, anal tone normal and guaiac negative stool. Prostate is enlarged (2+ smooth symm BPH). Prostate is not tender. Right testis shows no mass, no swelling and no tenderness. Left testis shows no mass, no swelling and no tenderness. Circumcised. No penile erythema or penile tenderness. No discharge found.  Musculoskeletal: Normal range of motion. He exhibits no edema, tenderness or deformity.  Lymphadenopathy:    He has no cervical adenopathy.       Right: No inguinal adenopathy present.       Left: No inguinal adenopathy present.  Neurological: He is alert and oriented to person, place, and time.  Skin: Skin is warm and dry. No rash noted. He is not diaphoretic. No erythema. No pallor.  Both great toenails are long and thick with subungual debris.  There is no erythema, swelling, or exudate.  Psychiatric: He has a normal mood and affect. His behavior is normal. Judgment and thought content normal.  Vitals reviewed.   Lab Results  Component Value Date   WBC 33.6 cH (HH) 03/18/2017   HGB 16.4 03/18/2017   HCT 48.3 03/18/2017   PLT 169.0 03/18/2017   GLUCOSE 112 (H) 03/18/2017   CHOL 135 03/18/2017   TRIG 128.0 03/18/2017   HDL 31.70 (L) 03/18/2017   LDLCALC 77 03/18/2017   ALT 20 03/18/2017   AST 21 03/18/2017   NA 139 03/18/2017   K 4.8 03/18/2017   CL 104 03/18/2017    CREATININE 1.03 03/18/2017   BUN 14 03/18/2017   CO2 29 03/18/2017   TSH 4.45 03/18/2017   PSA 3.68 03/18/2017   HGBA1C 6.0 03/18/2017    Ct Head Wo Contrast  Result Date: 03/26/2015 CLINICAL DATA:  82 year old male with frontal headaches since Christmas. History of migraines as young adult. Leukemia with last treatment 2003. Initial encounter. EXAM: CT HEAD WITHOUT CONTRAST TECHNIQUE: Contiguous axial images were obtained from the base of the skull through the vertex without intravenous contrast. COMPARISON:  None. FINDINGS: No intracranial hemorrhage or CT evidence of large acute infarct. Mild small vessel disease changes. No intracranial mass lesion noted on this unenhanced exam. Mild global atrophy typical of age without hydrocephalus. Prominent pineal calcification incidentally noted. Vascular calcifications. No orbital abnormality detected. Mastoid air cells, middle ear cavities and visualized paranasal sinuses are clear. No bony destructive lesion. IMPRESSION: No intracranial hemorrhage or CT evidence of large acute infarct. Vascular calcifications. Mild atrophy, typical of age. Mild  small vessel disease changes. Electronically Signed   By: Genia Del M.D.   On: 03/26/2015 18:10   US Carotid Duplex Bilateral  Result Date: 03/26/2015 CLINICAL DATA:  Left carotid bruit.  Hyperlipidemia. EXAM: BILATERAL CAROTID DUPLEX ULTRASOUND TECHNIQUE: Pearline Cables scale imaging, color Doppler and duplex ultrasound was performed of bilateral carotid and vertebral arteries in the neck. COMPARISON:  None. REVIEW OF SYSTEMS: Quantification of carotid stenosis is based on velocity parameters that correlate the residual internal carotid diameter with NASCET-based stenosis levels, using the diameter of the distal internal carotid lumen as the denominator for stenosis measurement. The following velocity measurements were obtained: PEAK SYSTOLIC/END DIASTOLIC RIGHT ICA:                     81/30cm/sec CCA:                      295/18AC/ZYS SYSTOLIC ICA/CCA RATIO:  0.8 DIASTOLIC ICA/CCA RATIO: 1.8 ECA:                     154cm/sec LEFT ICA:                     104/22cm/sec CCA:                     063/01SW/FUX SYSTOLIC ICA/CCA RATIO:  1.0 DIASTOLIC ICA/CCA RATIO: 1.0 ECA:                     146cm/sec FINDINGS: RIGHT CAROTID ARTERY: Mild partially calcified plaque in the carotid bulb extending into proximal internal and external carotid arteries without high-grade stenosis. Normal waveforms and color Doppler signal. RIGHT VERTEBRAL ARTERY:  Normal flow direction and waveform. LEFT CAROTID ARTERY: Eccentric partially calcified plaque in the carotid bulb and proximal ICA. No high-grade stenosis. Normal waveforms and color Doppler signal. LEFT VERTEBRAL ARTERY: Normal flow direction and waveform. IMPRESSION: 1. Bilateral carotid bifurcation and proximal ICA plaque, resulting in less than 50% diameter stenosis. The exam does not exclude plaque ulceration or embolization. Continued surveillance recommended. Electronically Signed   By: Lucrezia Europe M.D.   On: 03/26/2015 16:26    Assessment & Plan:   Kenneith was seen today for annual exam and hyperlipidemia.  Diagnoses and all orders for this visit:  TSH elevation-his TSH remains slightly elevated.  He is not not symptomatic with this.  I do not think any TSH less than 10 should be treated. -     Thyroid Panel With TSH; Future  CLL (chronic lymphocytic leukemia) ([place])- His white cell count and lymphocyte count remained stable.  He is not anemic.  He has no symptoms with this.  Will continue monitoring this but at this time I do not think therapy is indicated. -     CBC with Differential/Platelet; Future  Benign prostatic hyperplasia without lower urinary tract symptoms- His PSA remains low which is reassuring that he does not have prostate cancer.  He has no signs or symptoms that need to be treated. -     PSA; Future  Prediabetes- His A1c is at 6%.  He is prediabetic.  Medical  therapy is not indicated. -     Comprehensive metabolic panel; Future -     Hemoglobin A1c; Future  Hyperlipidemia LDL goal <130- He has achieved his LDL goal and is doing well on the statin. -     Comprehensive metabolic panel; Future -     Lipid  panel; Future  Routine general medical examination at a health care facility  Onychomycosis of great toe -     Ambulatory referral to Podiatry  Need for Tdap vaccination -     Tdap vaccine greater than or equal to 7yo IM   I have discontinued Correll A. Duskin's Ibuprofen and augmented betamethasone dipropionate. I am also having him maintain his aspirin, Multiple Vitamins-Minerals (CENTRUM SILVER PO), and atorvastatin.  No orders of the defined types were placed in this encounter.  See AVS for instructions about healthy living and anticipatory guidance.  Follow-up: Return in about 6 months (around 09/18/2017).  Scarlette Calico, MD

## 2017-03-19 ENCOUNTER — Encounter: Payer: Self-pay | Admitting: Internal Medicine

## 2017-03-19 LAB — THYROID PANEL WITH TSH
FREE THYROXINE INDEX: 2 (ref 1.4–3.8)
T3 Uptake: 30 % (ref 22–35)
T4 TOTAL: 6.6 ug/dL (ref 4.9–10.5)
TSH: 4.45 mIU/L (ref 0.40–4.50)

## 2017-03-20 ENCOUNTER — Encounter: Payer: Self-pay | Admitting: Internal Medicine

## 2017-03-20 NOTE — Assessment & Plan Note (Signed)

## 2017-04-13 ENCOUNTER — Encounter: Payer: Self-pay | Admitting: Podiatry

## 2017-04-13 ENCOUNTER — Ambulatory Visit: Payer: Medicare Other | Admitting: Podiatry

## 2017-04-13 DIAGNOSIS — B351 Tinea unguium: Secondary | ICD-10-CM

## 2017-04-13 DIAGNOSIS — G629 Polyneuropathy, unspecified: Secondary | ICD-10-CM | POA: Diagnosis not present

## 2017-04-13 NOTE — Progress Notes (Signed)
Subjective:    Patient ID: Francisco Larson, male    DOB: 13-Feb-1935, 82 y.o.   MRN: [id number]  HPI 82 year old male presents the office with concerns of nail fungus as well as his left toenail which was hurting a couple weeks ago.  He states the pain in the toenail has resolved.  He states the nail fungus itself is been ongoing for several years since he was in college he has had no treatment for the nails.  He currently denies any pain in the nails and denies any redness or drainage or any swelling.  He also states that he has some possible neuropathy to his feet increasing numbness to his feet but this is been ongoing for about 5 years and is been unchanged.  No burning pain that is not waking up at night.  He has no other lower extremity concerns today.   Review of Systems  All other systems reviewed and are negative.  Past Medical History:  Diagnosis Date  . Arthritis   . Hyperlipidemia   . Leukemia, chronic lymphocytic ([place])   . Shingles   . Vitamin D deficiency     Past Surgical History:  Procedure Laterality Date  . EYE SURGERY    . LAPAROSCOPIC TRANS ANAL AND TRANSABDOMINAL RECTAL RESECTION WITH COLOANAL ANASTOMOSIS    . SURGERY OF LIP     tumor  . TONSILLECTOMY    . VASECTOMY       Current Outpatient Medications:  .  aspirin 81 MG tablet, Take 81 mg by mouth daily., Disp: , Rfl:  .  atorvastatin (LIPITOR) 20 MG tablet, TAKE 1 TABLET BY MOUTH DAILY, Disp: 90 tablet, Rfl: 2 .  Multiple Vitamins-Minerals (CENTRUM SILVER PO), Take 1 tablet by mouth daily. , Disp: , Rfl:   Allergies  Allergen Reactions  . Z-Pak [Azithromycin] Other (See Comments)    Pt stated he thinks this made him dizzy    Social History   Socioeconomic History  . Marital status: Married    Spouse name: Not on file  . Number of children: 2  . Years of education: Not on file  . Highest education level: Not on file  Occupational History  . Occupation: Retired    Comment: Professor [employer]    Social Needs  . Financial resource strain: Not on file  . Food insecurity:    Worry: Not on file    Inability: Not on file  . Transportation needs:    Medical: Not on file    Non-medical: Not on file  Tobacco Use  . Smoking status: Never Smoker  . Smokeless tobacco: Never Used  Substance and Sexual Activity  . Alcohol use: No  . Drug use: No  . Sexual activity: Not on file  Lifestyle  . Physical activity:    Days per week: Not on file    Minutes per session: Not on file  . Stress: Not on file  Relationships  . Social connections:    Talks on phone: Not on file    Gets together: Not on file    Attends religious service: Not on file    Active member of club or organization: Not on file    Attends meetings of clubs or organizations: Not on file    Relationship status: Not on file  . Intimate partner violence:    Fear of current or ex partner: Not on file    Emotionally abused: Not on file    Physically abused: Not on file  Forced sexual activity: Not on file  Other Topics Concern  . Not on file  Social History Narrative  . Not on file         Objective:   Physical Exam  General: AAO x3, NAD  Dermatological: Nails appear to be hypertrophic, dystrophic, discolored with yellow-brown discoloration appear to be brittle as well.  There is no pain in the nails today there is no surrounding redness or drainage or any clinical signs of infection present.  There are no open sores, no preulcerative lesions, no rash or signs of infection present.  Vascular: Dorsalis Pedis artery and Posterior Tibial artery pedal pulses are 2/4 bilateral with immedate capillary fill time.  There is no pain with calf compression, swelling, warmth, erythema.   Neruologic: Grossly intact via light touch bilateral. Protective threshold with Semmes Wienstein monofilament intact to all pedal sites bilateral.   Musculoskeletal: No gross boney pedal deformities bilateral. No pain, crepitus, or  limitation noted with foot and ankle range of motion bilateral. Muscular strength 5/5 in all groups tested bilateral.  Gait: Unassisted, Nonantalgic.     Assessment & Plan:  82 year old male with onychomycosis, neuropathy -Treatment options discussed including all alternatives, risks, and complications -Etiology of symptoms were discussed -Regards to neuropathy this is been unchanged over the last several years but has not been progressive.  We will continue to monitor this.  We will hold off on medications at this point it is not causing pain. -Discussed treatment options for nail fungus.  Given the thickening would likely benefit more from an oral medication, Lamisil.  However before starting this medication we will contact his oncologist.  If we cannot do oral medication discussed topical version.  Trula Slade DPM

## 2017-04-21 ENCOUNTER — Telehealth: Payer: Self-pay | Admitting: Podiatry

## 2017-04-21 DIAGNOSIS — Z79899 Other long term (current) drug therapy: Secondary | ICD-10-CM

## 2017-04-21 NOTE — Telephone Encounter (Signed)
Val- please let him know that I spoke to his oncologist and he is ok with doing oral Lamisil but will need to check labs weekly. If he doesn't want to do the oral then we can do the topical through [facility] but oral would be better for him. If he wants to do it go and and order LFT and CBC with differential and need to recheck in 30 days. Can go ahead and send the Rx as well.   Thanks.    ---- Per his oncologist-   Am comfortable with either version, at your discretion.   If you are planning to follow his LFTs you could follow the CBC as well--monthly would be fine   Thanks!

## 2017-04-21 NOTE — Telephone Encounter (Signed)
Pt thought you were sending a Rx over for his toenail fungus. He said maybe he misunderstood.

## 2017-04-22 LAB — CBC WITH DIFFERENTIAL/PLATELET
BASOS PCT: 0.4 %
Basophils Absolute: 125 cells/uL (ref 0–200)
EOS PCT: 1.1 %
Eosinophils Absolute: 343 cells/uL (ref 15–500)
HEMATOCRIT: 46.9 % (ref 38.5–50.0)
HEMOGLOBIN: 16.2 g/dL (ref 13.2–17.1)
LYMPHS ABS: 26738 {cells}/uL — AB (ref 850–3900)
MCH: 30.3 pg (ref 27.0–33.0)
MCHC: 34.5 g/dL (ref 32.0–36.0)
MCV: 87.7 fL (ref 80.0–100.0)
MPV: 10.8 fL (ref 7.5–12.5)
Monocytes Relative: 2.2 %
Neutro Abs: 3307 cells/uL (ref 1500–7800)
Neutrophils Relative %: 10.6 %
Platelets: 176 10*3/uL (ref 140–400)
RBC: 5.35 10*6/uL (ref 4.20–5.80)
RDW: 13.2 % (ref 11.0–15.0)
TOTAL LYMPHOCYTE: 85.7 %
WBC: 31.2 10*3/uL — AB (ref 3.8–10.8)
WBCMIX: 686 {cells}/uL (ref 200–950)

## 2017-04-22 LAB — HEPATIC FUNCTION PANEL
AG Ratio: 1.9 (calc) (ref 1.0–2.5)
ALBUMIN MSPROF: 3.7 g/dL (ref 3.6–5.1)
ALT: 21 U/L (ref 9–46)
AST: 21 U/L (ref 10–35)
Alkaline phosphatase (APISO): 84 U/L (ref 40–115)
Bilirubin, Direct: 0.1 mg/dL (ref 0.0–0.2)
GLOBULIN: 1.9 g/dL (ref 1.9–3.7)
Indirect Bilirubin: 0.4 mg/dL (calc) (ref 0.2–1.2)
TOTAL PROTEIN: 5.6 g/dL — AB (ref 6.1–8.1)
Total Bilirubin: 0.5 mg/dL (ref 0.2–1.2)

## 2017-04-22 NOTE — Addendum Note (Signed)
Addended by: Harriett Sine D on: 04/22/2017 03:17 PM   Modules accepted: Orders

## 2017-04-22 NOTE — Telephone Encounter (Signed)
I explained Dr. Leigh Aurora conversation with the oncologist and recommendations. Pt states he would like to do the oral lamisil and labs monthly. I ordered labs and told pt we would call with the results and orders. Pt states understanding.

## 2017-04-23 ENCOUNTER — Telehealth: Payer: Self-pay | Admitting: *Deleted

## 2017-04-23 MED ORDER — NONFORMULARY OR COMPOUNDED ITEM: 2 refills | Status: DC

## 2017-04-23 NOTE — Telephone Encounter (Signed)
Pt states he is having 2nd thoughts concerning taking the oral medication for the fungus treatment and would like to talk to the nurse.

## 2017-04-23 NOTE — Telephone Encounter (Signed)
I informed pt of Dr. Leigh Aurora review of results and orders. I also read pt Dr. Leigh Aurora statement concerning labs monthly. Pt states to be on the safe side he would like to use the topical medication. I gave pt the [place] [phone] to call for information afternoon tomorrow if he had not been contacted.

## 2017-04-23 NOTE — Telephone Encounter (Signed)
Thanks

## 2017-04-23 NOTE — Telephone Encounter (Signed)
-----   Message from Trula Slade, DPM sent at 04/23/2017  6:35 AM EDT ----- OK to start Lamisil; please have this rechecked in 1 month both the CBC with differential and LFT. Val- can you please let him know and re-order it or have him come in? Thanks.

## 2017-05-25 ENCOUNTER — Ambulatory Visit: Payer: Medicare Other | Admitting: Podiatry

## 2017-05-25 ENCOUNTER — Encounter: Payer: Self-pay | Admitting: Podiatry

## 2017-05-25 DIAGNOSIS — B351 Tinea unguium: Secondary | ICD-10-CM | POA: Diagnosis not present

## 2017-05-27 NOTE — Progress Notes (Signed)
Subjective: 82 year old male presents the office today for follow-up evaluation of nail fungus.  Has been using topical medication as he did not want him on the risks of the oral medication.  He states that he can tell mild difference in the toenail since using topical.  He has no new concerns today. Denies any systemic complaints such as fevers, chills, nausea, vomiting. No acute changes since last appointment, and no other complaints at this time.   Objective: AAO x3, NAD DP/PT pulses palpable bilaterally, CRT less than 3 seconds Nails appear to be hypertrophic, dystrophic ill-defined discoloration.  There is does appear to be some mild pain on the proximal nail corner.  There is no pain in the nails there is no surrounding redness or drainage or any clinical signs of infection. No open lesions or pre-ulcerative lesions.  No pain with calf compression, swelling, warmth, erythema  Assessment: Onychomycosis  Plan: -All treatment options discussed with the patient including all alternatives, risks, complications.  -Continue with topical medication.  Discussed need to use this for up to a year to see the best results.  As a courtesy to debride the toenail so that any complications or bleeding. -Patient encouraged to call the office with any questions, concerns, change in symptoms.   Trula Slade DPM

## 2017-08-03 ENCOUNTER — Other Ambulatory Visit: Payer: Self-pay | Admitting: Cardiovascular Disease

## 2017-08-19 NOTE — Progress Notes (Signed)
Patient ID: Francisco [facility], male   DOB: 1935-10-30, 82 y.o.   MRN: [id number]     Cardiology Office Note   Date:  08/20/2017   ID:  Francisco [facility], DOB 05-Aug-1935, MRN [id number]  PCP:  Janith Lima, MD  Cardiologist:   Jenkins Rouge, MD   No chief complaint on file.     History of Present Illness: 82 y.o. history of PAC;s , CAD by calcium scoring 285 in 2016 HLD and CLL.  WBC typically in 30 range but no Rx needed since 2003.  Strong family history of premature CAD. Very active goes to [employer]. Some arthritis in left knee limits activity    Calcium Score:  03/13/14 IMPRESSION: Coronary calcium score of 285. This was 40 st percentile for age and sex matched control.  Duplex 03/26/15 less than 50% bilateral ICA stenosis   Son lives in [place] does rafting tours Daughter lives in [place] just had twin boys   No cardiac complaints goes to gym 5x/week  Past Medical History:  Diagnosis Date  . Arthritis   . Hyperlipidemia   . Leukemia, chronic lymphocytic ([place])   . Shingles   . Vitamin D deficiency     Past Surgical History:  Procedure Laterality Date  . EYE SURGERY    . LAPAROSCOPIC TRANS ANAL AND TRANSABDOMINAL RECTAL RESECTION WITH COLOANAL ANASTOMOSIS    . SURGERY OF LIP     tumor  . TONSILLECTOMY    . VASECTOMY       Current Outpatient Medications  Medication Sig Dispense Refill  . aspirin 81 MG tablet Take 81 mg by mouth daily.    Marland Kitchen atorvastatin (LIPITOR) 20 MG tablet Take 1 tablet (20 mg total) by mouth daily. Please keep upcoming appt in August for future refills. Thank you 90 tablet 0  . Multiple Vitamins-Minerals (CENTRUM SILVER PO) Take 1 tablet by mouth daily.     . NONFORMULARY OR COMPOUNDED ITEM [facility]: Onychomycosis Nail Lacquer - Fluconazole 2%, Terbinafine 1%, DMSO, apply to affected area 1-2 times daily. 120 each 2   No current facility-administered medications for this visit.     Allergies:   Z-pak  [azithromycin]    Social History:  The patient  reports that he has never smoked. He has never used smokeless tobacco. He reports that he does not drink alcohol or use drugs.   Family History:  The patient's family history includes Asthma in his sister; COPD in his maternal grandmother; Cancer in his mother; Colon cancer in his sister; Diabetes in his paternal grandmother; Emphysema in his mother; Heart disease in his father; Hyperlipidemia in his sister; Mental illness in his paternal grandmother; Stroke in his maternal grandfather and sister.    ROS:  Please see the history of present illness.   Otherwise, review of systems are positive for none.   All other systems are reviewed and negative.    PHYSICAL EXAM: VS:  BP 126/84   Pulse 69   Ht 5\' 11"  (1.803 m)   Wt 211 lb 3.2 oz (95.8 kg)   SpO2 97%   BMI 29.46 kg/m  , BMI Body mass index is 29.46 kg/m. Affect appropriate Healthy:  appears stated age 48: normal Neck supple with no adenopathy JVP normal left  bruits no thyromegaly Lungs clear with no wheezing and good diaphragmatic motion Heart:  S1/S2 no murmur, no rub, gallop or click PMI normal Abdomen: benighn, BS positve, no tenderness, no AAA no bruit.  No HSM or HJR Distal pulses intact with no bruits No edema Neuro non-focal Skin warm and dry No muscular weakness     EKG:   08/20/17 SR rate 66 normal    Recent Labs: 03/18/2017: BUN 14; Creatinine, Ser 1.03; Potassium 4.8; Sodium 139; TSH 4.45 04/22/2017: ALT 21; Hemoglobin 16.2; Platelets 176    Lipid Panel    Component Value Date/Time   CHOL 135 03/18/2017 1021   CHOL 136 02/17/2017 0832   TRIG 128.0 03/18/2017 1021   HDL 31.70 (L) 03/18/2017 1021   HDL 29 (L) 02/17/2017 0832   CHOLHDL 4 03/18/2017 1021   VLDL 25.6 03/18/2017 1021   LDLCALC 77 03/18/2017 1021   LDLCALC 82 02/17/2017 0832      Wt Readings from Last 3 Encounters:  08/20/17 211 lb 3.2 oz (95.8 kg)  03/18/17 216 lb (98 kg)  09/17/16  213 lb 14.4 oz (97 kg)      Other studies Reviewed: Additional studies/ records that were reviewed today include: records Dr Everlene Farrier and [facility] office notes .    ASSESSMENT AND PLAN:  1. Family History CAD:  No indication for ETT Active and asymptomatic  Baseline ECG is normal . 2016 Calcium score 285 which is 61st percentile Will update his score and continue risk factor modification  2. Chol:  On statin LFTls ok  Lab Results  Component Value Date   LDLCALC 77 03/18/2017     3. CLL  WBC 31.2   Been like that for 5 years.  F/u oncology 4. Left bruit:  Duplex without significant stenosis ASA / Statin  F/u US ordered last done 03/26/15   Current medicines are reviewed at length with the patient today.  The patient does not have concerns regarding medicines.  The following changes have been made:     Labs/ tests ordered today include:  Calcium score and carotid duplex   Orders Placed This Encounter  Procedures  . EKG 12-Lead     Disposition:   FU with  Me a year     Signed, Jenkins Rouge, MD  08/20/2017 8:44 AM    [facility] [address], [place], [place]  [address] Phone: [phone]; Fax: [phone]

## 2017-08-20 ENCOUNTER — Ambulatory Visit: Payer: Medicare Other | Admitting: Cardiovascular Disease

## 2017-08-20 ENCOUNTER — Encounter: Payer: Self-pay | Admitting: Cardiovascular Disease

## 2017-08-20 VITALS — BP 126/84 | HR 69 | Ht 71.0 in | Wt 211.2 lb

## 2017-08-20 DIAGNOSIS — Z8249 Family history of ischemic heart disease and other diseases of the circulatory system: Secondary | ICD-10-CM | POA: Diagnosis not present

## 2017-08-20 DIAGNOSIS — R0989 Other specified symptoms and signs involving the circulatory and respiratory systems: Secondary | ICD-10-CM

## 2017-08-20 DIAGNOSIS — E785 Hyperlipidemia, unspecified: Secondary | ICD-10-CM | POA: Diagnosis not present

## 2017-08-20 NOTE — Patient Instructions (Addendum)
Medication Instructions:  Your physician recommends that you continue on your current medications as directed. Please refer to the Current Medication list given to you today.  Labwork: NONE  Testing/Procedures: Your physician has requested that you have a carotid duplex. This test is an ultrasound of the carotid arteries in your neck. It looks at blood flow through these arteries that supply the brain with blood. Allow one hour for this exam. There are no restrictions or special instructions.  Follow-Up: Your physician wants you to follow-up in: 12 months with Dr. Nishan. You will receive a reminder letter in the mail two months in advance. If you don't receive a letter, please call our office to schedule the follow-up appointment.   If you need a refill on your cardiac medications before your next appointment, please call your pharmacy.    

## 2017-08-24 ENCOUNTER — Ambulatory Visit: Payer: Medicare Other | Admitting: Podiatry

## 2017-08-24 ENCOUNTER — Encounter: Payer: Self-pay | Admitting: Podiatry

## 2017-08-24 DIAGNOSIS — B351 Tinea unguium: Secondary | ICD-10-CM | POA: Diagnosis not present

## 2017-08-25 ENCOUNTER — Ambulatory Visit (HOSPITAL_COMMUNITY)
Admission: RE | Admit: 2017-08-25 | Discharge: 2017-08-25 | Disposition: A | Discharge status: Home / Self Care | Payer: Medicare Other | Source: Ambulatory Visit | Attending: Cardiology | Admitting: Cardiology

## 2017-08-25 DIAGNOSIS — R0989 Other specified symptoms and signs involving the circulatory and respiratory systems: Secondary | ICD-10-CM | POA: Insufficient documentation

## 2017-08-26 NOTE — Progress Notes (Signed)
Subjective: 82 year old male presents the office today for follow-up evaluation of nail fungus.  He states he is continued to topical treatment in the left big toenail to come off partially however the nails are still thickened discolored.  Denies any significant pain in the nails and denies any redness or drainage or any swelling.  He has no other concerns today.   Denies any systemic complaints such as fevers, chills, nausea, vomiting. No acute changes since last appointment, and no other complaints at this time.   Objective: AAO x3, NAD DP/PT pulses palpable bilaterally, CRT less than 3 seconds Overall the nails continue be hypertrophic, dystrophic with yellow-brown discoloration.  Left hallux toenail does appear to be better since the piece of it came off and there is a clearing of the nail.  There is no pain in the nails and there is no swelling redness or drainage or any signs of infection at this time.  No open lesions or pre-ulcerative lesions.  No pain with calf compression, swelling, warmth, erythema  Assessment: Chronic medical  Plan: -All treatment options discussed with the patient including all alternatives, risks, complications.  -I debrided the nails as a courtesy without any comp occasions or bleeding.  Continue with topical antifungal and all the nails.  Monitoring signs or symptoms of infection. -Patient encouraged to call the office with any questions, concerns, change in symptoms.   Trula Slade DPM

## 2017-10-12 ENCOUNTER — Telehealth: Payer: Self-pay | Admitting: *Deleted

## 2017-10-12 ENCOUNTER — Telehealth: Payer: Self-pay | Admitting: Podiatry

## 2017-10-12 MED ORDER — NONFORMULARY OR COMPOUNDED ITEM: 2 refills | Status: DC

## 2017-10-12 NOTE — Telephone Encounter (Signed)
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 Mr. Fini is requesting a refill request or a new prescription for the following Onychomycosis Nail Lacquer - Fluconazole 2%, Terbinafine 1%, DMSO, apply to affected area 1-2 times daily. Please give [place] a call back at [phone]. Thank you and have a great day.

## 2017-10-12 NOTE — Telephone Encounter (Signed)
I informed Melissa - [facility] Dr. Berton Lan the refill of the Onychomycosis Nail Laquer +11refills for pt.

## 2017-10-12 NOTE — Telephone Encounter (Signed)
Cranford Mon, CMA faxed the completed [facility] paperwork

## 2017-10-27 ENCOUNTER — Ambulatory Visit: Payer: Medicare Other | Admitting: Podiatry

## 2017-10-27 DIAGNOSIS — B351 Tinea unguium: Secondary | ICD-10-CM

## 2017-10-28 DIAGNOSIS — M25561 Pain in right knee: Secondary | ICD-10-CM | POA: Insufficient documentation

## 2017-11-02 NOTE — Progress Notes (Signed)
Subjective: 82 year old male presents the office today for follow-up evaluation of nail fungus.  He has been continue the topical antifungal and overall he is noticed some mild improvement in the nails.  Denies any pain in the nails and denies any redness or drainage or any swelling.  No other concerns today.  Objective: AAO x3, NAD DP/PT pulses palpable bilaterally, CRT less than 3 seconds Overall the nails continue be hypertrophic, dystrophic with yellow-brown discoloration. The toenails appears to have some clearing on the proximal aspect toenail.  There is no pain in the nails there is no swelling redness or drainage or any clinical signs of infection noted today. No pain with calf compression, swelling, warmth, erythema  Assessment: Chronic onychomycosis   Plan: -All treatment options discussed with the patient including all alternatives, risks, complications.  -I debrided the nails as a courtesy without any comp occasions or bleeding.  Continue with topical antifungal and all the nails.  Monitoring signs or symptoms of infection. -Patient encouraged to call the office with any questions, concerns, change in symptoms.   Trula Slade DPM

## 2017-11-03 ENCOUNTER — Other Ambulatory Visit: Payer: Self-pay | Admitting: Cardiovascular Disease

## 2017-11-04 ENCOUNTER — Other Ambulatory Visit: Payer: Self-pay | Admitting: Cardiovascular Disease

## 2017-11-16 ENCOUNTER — Other Ambulatory Visit: Payer: Self-pay

## 2017-11-16 DIAGNOSIS — C9110 Chronic lymphocytic leukemia of B-cell type not having achieved remission: Secondary | ICD-10-CM

## 2017-11-17 ENCOUNTER — Inpatient Hospital Stay: Payer: Medicare Other | Attending: Oncology

## 2017-11-17 DIAGNOSIS — Z79899 Other long term (current) drug therapy: Secondary | ICD-10-CM | POA: Insufficient documentation

## 2017-11-17 DIAGNOSIS — C9110 Chronic lymphocytic leukemia of B-cell type not having achieved remission: Secondary | ICD-10-CM | POA: Diagnosis not present

## 2017-11-17 LAB — CMP (CANCER CENTER ONLY)
ALT: 22 U/L (ref 0–44)
AST: 26 U/L (ref 15–41)
Albumin: 3.4 g/dL — ABNORMAL LOW (ref 3.5–5.0)
Alkaline Phosphatase: 90 U/L (ref 38–126)
Anion gap: 6 (ref 5–15)
BUN: 17 mg/dL (ref 8–23)
CO2: 26 mmol/L (ref 22–32)
CREATININE: 1.15 mg/dL (ref 0.61–1.24)
Calcium: 9.1 mg/dL (ref 8.9–10.3)
Chloride: 106 mmol/L (ref 98–111)
GFR, EST NON AFRICAN AMERICAN: 58 mL/min — AB (ref >60)
GFR, Est AFR Am: >60 mL/min (ref >60)
Glucose, Bld: 113 mg/dL — ABNORMAL HIGH (ref 70–99)
POTASSIUM: 4.6 mmol/L (ref 3.5–5.1)
Sodium: 138 mmol/L (ref 135–145)
Total Bilirubin: 0.6 mg/dL (ref 0.3–1.2)
Total Protein: 6.3 g/dL — ABNORMAL LOW (ref 6.5–8.1)

## 2017-11-17 LAB — CBC WITH DIFFERENTIAL (CANCER CENTER ONLY)
Abs Immature Granulocytes: 0.05 10*3/uL (ref 0.00–0.07)
BASOS ABS: 0.1 10*3/uL (ref 0.0–0.1)
Basophils Relative: 0 %
EOS ABS: 0.4 10*3/uL (ref 0.0–0.5)
EOS PCT: 1 %
HCT: 49.1 % (ref 39.0–52.0)
Hemoglobin: 16.1 g/dL (ref 13.0–17.0)
Immature Granulocytes: 0 %
LYMPHS ABS: 24.7 10*3/uL — AB (ref 0.7–4.0)
Lymphocytes Relative: 85 %
MCH: 29.6 pg (ref 26.0–34.0)
MCHC: 32.8 g/dL (ref 30.0–36.0)
MCV: 90.3 fL (ref 80.0–100.0)
Monocytes Absolute: 0.9 10*3/uL (ref 0.1–1.0)
Monocytes Relative: 3 %
NEUTROS PCT: 11 %
NRBC: 0 % (ref 0.0–0.2)
Neutro Abs: 3.1 10*3/uL (ref 1.7–7.7)
PLATELETS: 164 10*3/uL (ref 150–400)
RBC: 5.44 MIL/uL (ref 4.22–5.81)
RDW: 13 % (ref 11.5–15.5)
WBC: 29.2 10*3/uL — AB (ref 4.0–10.5)

## 2017-11-17 LAB — LACTATE DEHYDROGENASE: LDH: 170 U/L (ref 98–192)

## 2017-11-18 LAB — BETA 2 MICROGLOBULIN, SERUM: Beta-2 Microglobulin: 2.9 mg/L — ABNORMAL HIGH (ref 0.6–2.4)

## 2017-11-23 NOTE — Progress Notes (Signed)
[facility]  [phone] [phone]    ID: Francisco Larson   DOB: 11-Oct-1935  MR#: [id number]  DEY#:[id number]  Patient Care Team: Francisco Lima, MD as PCP - General (Internal Medicine) Francisco Hector, MD as PCP - Cardiology (Cardiology) Francisco Hector, MD as Consulting Physician (Cardiology) Magrinat, Virgie Dad, MD as Consulting Physician (Oncology) Francisco Overall, MD as Consulting Physician (General Surgery) Francisco Crome, MD as Consulting Physician (Cardiology) Francisco Monarch, MD as Consulting Physician (Dermatology) OTHER MD: Francisco Jordan MD    INTERVAL HISTORY: Francisco Larson returns today for follow-up of his chronic lymphoid leukemia. The patient continues under observation.   Mostly recent lab work obtained 11/17/2017 finds the absolute lymphocyte count at 24.7, and a total white cell count as follows: As follows: Results for Francisco Larson (MRN [id number]) as of 11/24/2017 08:50  Ref. Range 09/10/2016 10:24 03/18/2017 10:21 04/22/2017 16:01 11/17/2017 08:16  WBC Latest Ref Range: 4.0 - 10.5 K/uL 34.5 (H) 33.6 cH (HH) 31.2 (H) 29.2 (H)   The hemoglobin remains in the normal range at 16.2 as do the platelets are 176,000.   REVIEW OF SYSTEMS: Francisco Larson is doing well. He has been staying active with grandchildren and great grandchildren. He also has done some traveling over the summer months. He continues to exercise 4-5 times a week. The patient denies unusual headaches, visual changes, nausea, vomiting, or dizziness. There has been no unusual cough, phlegm production, or pleurisy. This been no change in bowel or bladder habits. The patient denies unexplained fatigue or unexplained weight loss, bleeding, rash, or fever. A detailed review of systems was otherwise noncontributory.   PAST MEDICAL HISTORY: Past Medical History:  Diagnosis Date  . Arthritis   . Hyperlipidemia   . Leukemia, chronic lymphocytic ([place])   . Shingles   . Vitamin D deficiency      PAST SURGICAL HISTORY: Past Surgical History:  Procedure Laterality Date  . EYE SURGERY    . LAPAROSCOPIC TRANS ANAL AND TRANSABDOMINAL RECTAL RESECTION WITH COLOANAL ANASTOMOSIS    . SURGERY OF LIP     tumor  . TONSILLECTOMY    . VASECTOMY      FAMILY HISTORY Family History  Problem Relation Age of Onset  . Colon cancer Sister   . Hyperlipidemia Sister   . Stroke Sister   . Larson disease Father   . Emphysema Mother        never smoker, but exposed to 2nd hand from spouse  . Cancer Mother   . COPD Maternal Grandmother        never smoker  . Stroke Maternal Grandfather   . Diabetes Paternal Grandmother   . Mental illness Paternal Grandmother   . Asthma Sister    The patient's mother died at the age of 46. The patient's father died at the age of 55 from Larson disease. He had no brothers. His only sister, currently 23 years old, has a history of late breast cancer and late middle-aged colon cancer.  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 He has been married to [place] [facility]arson 50+ years, and their children are [place] lives in [place] and has 4 children, and [place] who lives in [place] and has no children. He recently became a great-grandfather to two twin girls, who were born in August 2019. Francisco Larson is an elder at [employer].    ADVANCED DIRECTIVES: in place  HEALTH MAINTENANCE: Social History   Tobacco Use  . Smoking status: Never Smoker  . Smokeless tobacco: Never Used  Substance Use Topics  . Alcohol use: No  . Drug use: No     Colonoscopy:10/18/2014  PSA: 3.13 03/17/2016  Lipid panel:  Allergies  Allergen Reactions  . Z-Pak [Azithromycin] Other (See Comments)    Pt stated he thinks this made him dizzy    Current Outpatient Medications  Medication Sig Dispense Refill  . aspirin 81 MG tablet Take 81 mg by mouth daily.    Marland Kitchen atorvastatin  (LIPITOR) 20 MG tablet TAKE 1 TABLET BY MOUTH DAILY 90 tablet 3  . Multiple Vitamins-Minerals (CENTRUM SILVER PO) Take 1 tablet by mouth daily.     . NONFORMULARY OR COMPOUNDED ITEM [facility]: Onychomycosis Nail Lacquer - Fluconazole 2%, Terbinafine 1%, DMSO, apply to affected area 1-2 times daily. 120 each 2   No current facility-administered medications for this visit.     OBJECTIVE: Middle-aged white man in no acute distress  Vitals:   11/24/17 1200  BP: (!) 141/70  Pulse: 66  Resp: 20  Temp: (!) 97.5 F (36.4 C)  SpO2: 98%     Body mass index is 29.48 kg/m.    ECOG FS: 0  Sclerae unicteric, pupils round and equal Oropharynx clear and moist No cervical or supraclavicular adenopathy, no axillary or inguinal adenopathy  lungs no rales or rhonchi Larson regular rate and rhythm Abd soft, nontender, positive bowel sounds MSK no focal spinal tenderness, no upper extremity lymphedema Neuro: nonfocal, well oriented, appropriate affect  LAB:  Lab Results  Component Value Date   WBC 29.2 (H) 11/17/2017   NEUTROABS 3.1 11/17/2017   HGB 16.1 11/17/2017   HCT 49.1 11/17/2017   MCV 90.3 11/17/2017   PLT 164 11/17/2017      Chemistry      Component Value Date/Time   NA 138 11/17/2017 0816   NA 137 09/10/2016 1024   K 4.6 11/17/2017 0816   K 4.6 09/10/2016 1024   CL 106 11/17/2017 0816   CL 108 (H) 05/26/2012 0804   CO2 26 11/17/2017 0816   CO2 26 09/10/2016 1024   BUN 17 11/17/2017 0816   BUN 17.3 09/10/2016 1024   CREATININE 1.15 11/17/2017 0816   CREATININE 1.1 09/10/2016 1024      Component Value Date/Time   CALCIUM 9.1 11/17/2017 0816   CALCIUM 8.9 09/10/2016 1024   ALKPHOS 90 11/17/2017 0816   ALKPHOS 80 09/10/2016 1024   AST 26 11/17/2017 0816   AST 25 09/10/2016 1024   ALT 22 11/17/2017 0816   ALT 23 09/10/2016 1024   BILITOT 0.6 11/17/2017 0816   BILITOT 0.62 09/10/2016 1024      No results found for: LABCA2  No components found for:  DTOIZ124  No results for input(s): INR in the last 168 hours.  Urinalysis    Component Value Date/Time   COLORURINE YELLOW 03/17/2016 0936    STUDIES: No results found.   ASSESSMENT: 82 y.o. 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   PLAN:  Francisco Larson is now 22 years out from initial diagnosis of his chronic lymphoid leukemia, and 15 years out from his last treatment, which was rituximab in 2004.  There continues to be no indication for treatment.  He has no anemia or thrombocytopenia, no "B" symptoms, and his  absolute lymphocyte count is actually down from the most recent determination  Accordingly we will continue to follow as before, with a yearly visit and yearly labs.  He also gets lab work through his primary care physician and through his cardiologist so if any abnormality is found in those tests he will let me know  He knows to call for any other issues that may develop before the next visit.  Magrinat, Virgie Dad, MD  11/24/17 12:20 PM Medical Oncology and Hematology [facility] [address] [place], [place] [address] Tel. [phone]    Fax. [phone]  I, Margit Banda am acting as a scribe for Chauncey Cruel, MD.   I, Lurline Del MD, have reviewed the above documentation for accuracy and completeness, and I agree with the above.

## 2017-11-24 ENCOUNTER — Telehealth: Payer: Self-pay | Admitting: Oncology

## 2017-11-24 ENCOUNTER — Inpatient Hospital Stay (HOSPITAL_BASED_OUTPATIENT_CLINIC_OR_DEPARTMENT_OTHER): Payer: Medicare Other | Admitting: Oncology

## 2017-11-24 VITALS — BP 141/70 | HR 66 | Temp 97.5°F | Resp 20 | Ht 71.0 in | Wt 211.4 lb

## 2017-11-24 DIAGNOSIS — C9110 Chronic lymphocytic leukemia of B-cell type not having achieved remission: Secondary | ICD-10-CM

## 2017-11-24 NOTE — Telephone Encounter (Signed)
Gave pt avs and calendar  °

## 2017-11-25 ENCOUNTER — Ambulatory Visit (INDEPENDENT_AMBULATORY_CARE_PROVIDER_SITE_OTHER): Payer: Medicare Other

## 2017-11-25 DIAGNOSIS — Z23 Encounter for immunization: Secondary | ICD-10-CM

## 2017-12-18 ENCOUNTER — Ambulatory Visit (INDEPENDENT_AMBULATORY_CARE_PROVIDER_SITE_OTHER): Payer: Medicare Other

## 2017-12-18 ENCOUNTER — Ambulatory Visit (HOSPITAL_COMMUNITY)
Admission: EM | Admit: 2017-12-18 | Discharge: 2017-12-18 | Disposition: A | Discharge status: Home / Self Care | Payer: Medicare Other | Attending: Family Medicine | Admitting: Family Medicine

## 2017-12-18 ENCOUNTER — Encounter (HOSPITAL_COMMUNITY): Payer: Self-pay | Admitting: Family Medicine

## 2017-12-18 DIAGNOSIS — S92901A Unspecified fracture of right foot, initial encounter for closed fracture: Secondary | ICD-10-CM

## 2017-12-18 NOTE — ED Provider Notes (Signed)
[place]    CSN: [id number] Arrival date & time: 12/18/17  1004     History   Chief Complaint Chief Complaint  Patient presents with  . Foot Pain    HPI Francisco Larson is a 82 y.o. male.   82 year old gentleman with right foot pain.  This is his initial visit to the [facility] urgent care.  He twisted his right foot yesterday while setting up the Christmas tree.  He has a history of 1/5 metatarsal fracture.  The pain is in the proximal fifth metatarsal region on the lateral aspect of the right foot.  Past medical history is significant for CLL.  He has been maintaining stable counts of 30,000 for the last 15 years.     Past Medical History:  Diagnosis Date  . Arthritis   . Hyperlipidemia   . Leukemia, chronic lymphocytic ([place])   . Shingles   . Vitamin D deficiency     Patient Active Problem List   Diagnosis Date Noted  . Onychomycosis of great toe 03/18/2017  . Prediabetes 03/19/2016  . Routine general medical examination at a health care facility 03/19/2016  . TSH elevation 03/17/2016  . Benign prostatic hyperplasia without lower urinary tract symptoms 03/17/2016  . Vitamin D deficiency 09/07/2014  . Hyperlipidemia LDL goal <130 01/27/2012  . CLL (chronic lymphocytic leukemia) ([place]) 11/27/2011    Past Surgical History:  Procedure Laterality Date  . EYE SURGERY    . LAPAROSCOPIC TRANS ANAL AND TRANSABDOMINAL RECTAL RESECTION WITH COLOANAL ANASTOMOSIS    . SURGERY OF LIP     tumor  . TONSILLECTOMY    . VASECTOMY         Home Medications    Prior to Admission medications   Medication Sig Start Date End Date Taking? Authorizing Provider  aspirin 81 MG tablet Take 81 mg by mouth daily.    [provider]  atorvastatin (LIPITOR) 20 MG tablet TAKE 1 TABLET BY MOUTH DAILY 11/03/17   Josue Hector, MD  Multiple Vitamins-Minerals (CENTRUM SILVER PO) Take 1 tablet by mouth daily.     [provider]  NONFORMULARY OR  COMPOUNDED [facility]: Onychomycosis Nail Lacquer - Fluconazole 2%, Terbinafine 1%, DMSO, apply to affected area 1-2 times daily. 10/12/17   Trula Slade, DPM    Family History Family History  Problem Relation Age of Onset  . Colon cancer Sister   . Hyperlipidemia Sister   . Stroke Sister   . Heart disease Father   . Emphysema Mother        never smoker, but exposed to 2nd hand from spouse  . Cancer Mother   . COPD Maternal Grandmother        never smoker  . Stroke Maternal Grandfather   . Diabetes Paternal Grandmother   . Mental illness Paternal Grandmother   . Asthma Sister     Social History Social History   Tobacco Use  . Smoking status: Never Smoker  . Smokeless tobacco: Never Used  Substance Use Topics  . Alcohol use: No  . Drug use: No     Allergies   Z-pak [azithromycin]   Review of Systems Review of Systems   Physical Exam Triage Vital Signs ED Triage Vitals  Enc Vitals Group     BP      Pulse      Resp      Temp      Temp src      SpO2  Weight      Height      Head Circumference      Peak Flow      Pain Score      Pain Loc      Pain Edu?      Excl. in [place]?    No data found.  Updated Vital Signs BP 132/70 (BP Location: Left Arm)   Pulse 75   Temp 97.6 F (36.4 C) (Oral)   Resp 18   SpO2 95%    Physical Exam  Constitutional: He is oriented to person, place, and time. He appears well-developed and well-nourished.  Appears much younger than stated age  HENT:  Right Ear: External ear normal.  Left Ear: External ear normal.  Mouth/Throat: Oropharynx is clear and moist.  Eyes: Conjunctivae are normal.  Neck: Normal range of motion. Neck supple.  Pulmonary/Chest: Effort normal.  Musculoskeletal: He exhibits tenderness. He exhibits no edema or deformity.  1 to 2 cm area of ecchymosis over the lateral right foot with mild tenderness.  Neurological: He is alert and oriented to person, place, and time.  Skin: Skin  is warm and dry.  Psychiatric: He has a normal mood and affect.  Nursing note and vitals reviewed.    UC Treatments / Results  Labs (all labs ordered are listed, but only abnormal results are displayed) Labs Reviewed - No data to display  EKG None  Radiology Dg Foot Complete Right  Result Date: 12/18/2017 CLINICAL DATA:  Lateral right foot pain and bruising following a twisting injury yesterday. EXAM: RIGHT FOOT COMPLETE - 3+ VIEW COMPARISON:  None. FINDINGS: Nondisplaced transverse fracture of the proximal aspect of the base of the 5th metatarsal. Mild inferior posterior calcaneal spur formation. Minimal dorsal tarsal spur formation. IMPRESSION: Nondisplaced fracture of the proximal 5th metatarsal. Electronically Signed   By: Claudie Revering M.D.   On: 12/18/2017 10:47    Procedures Procedures (including critical care time)  Medications Ordered in UC Medications - No data to display  Initial Impression / Assessment and Plan / UC Course  I have reviewed the triage vital signs and the nursing notes.  Pertinent labs & imaging results that were available during my care of the patient were reviewed by me and considered in my medical decision making (see chart for details).    Final Clinical Impressions(s) / UC Diagnoses   Final diagnoses:  Foot fracture, right, closed, initial encounter     Discharge Instructions     This type of fracture typically takes 6 weeks to heal.  The treatment is to brace the foot with a lace up shoe, preferably a high top.  If you are still having problems after several more weeks, please return so we can take a look at the x-ray again.  CLINICAL DATA:  Lateral right foot pain and bruising following a twisting injury yesterday.   EXAM: RIGHT FOOT COMPLETE - 3+ VIEW   COMPARISON:  None.   FINDINGS: Nondisplaced transverse fracture of the proximal aspect of the base of the 5th metatarsal. Mild inferior posterior calcaneal spur formation. Minimal  dorsal tarsal spur formation.   IMPRESSION: Nondisplaced fracture of the proximal 5th metatarsal.     Electronically Signed   By: Claudie Revering M.D.   On: 12/18/2017 10:47    ED Prescriptions    None     Controlled Substance Prescriptions [place] Controlled Substance Registry consulted? Not Applicable   Robyn Haber, MD 12/18/17 1112

## 2017-12-18 NOTE — Discharge Instructions (Addendum)
This type of fracture typically takes 6 weeks to heal.  The treatment is to brace the foot with a lace up shoe, preferably a high top.  If you are still having problems after several more weeks, please return so we can take a look at the x-ray again.  CLINICAL DATA:  Lateral right foot pain and bruising following a twisting injury yesterday.   EXAM: RIGHT FOOT COMPLETE - 3+ VIEW   COMPARISON:  None.   FINDINGS: Nondisplaced transverse fracture of the proximal aspect of the base of the 5th metatarsal. Mild inferior posterior calcaneal spur formation. Minimal dorsal tarsal spur formation.   IMPRESSION: Nondisplaced fracture of the proximal 5th metatarsal.     Electronically Signed   By: Claudie Revering M.D.   On: 12/18/2017 10:47

## 2017-12-18 NOTE — ED Triage Notes (Signed)
Pt sts pain at 5th digit in right foot after stepping down off stool yesterday

## 2017-12-28 ENCOUNTER — Encounter: Payer: Self-pay | Admitting: Podiatry

## 2017-12-28 ENCOUNTER — Ambulatory Visit: Payer: Medicare Other | Admitting: Podiatry

## 2017-12-28 DIAGNOSIS — S92301A Fracture of unspecified metatarsal bone(s), right foot, initial encounter for closed fracture: Secondary | ICD-10-CM

## 2017-12-28 DIAGNOSIS — B351 Tinea unguium: Secondary | ICD-10-CM

## 2018-01-03 NOTE — Progress Notes (Signed)
Subjective: 82 year old male presents the office today for follow-up evaluation of nail fungus and to have his nails trimmed.  He has been continue the topical antifungal and overall he is noticed some mild improvement in the nails.  He also reports that he did fracture his right fifth met last week into urgent care and he was told to wear supportive shoe.  Tenderness has improved.  Still with some intermittent swelling.  Denies any pain in the nails and denies any redness or drainage or any swelling.  No other concerns today.  Objective: AAO x3, NAD DP/PT pulses palpable bilaterally, CRT less than 3 seconds Overall the nails continue be hypertrophic, dystrophic with yellow-brown discoloration.  There is still some clearing on the proximal aspect.  There is no pain of the nails there is no surrounding redness or drainage or any signs of infection noted today. Mild tenderness to palpation of the right fifth metatarsal base with minimal edema but there is no erythema or warmth.  There is no pain on the course of the peroneal tendon overall the tendon appears to be intact.  There is no other areas of tenderness. No pain with calf compression, swelling, warmth, erythema  Assessment: Chronic onychomycosi ; right fifth metatarsal base avulsion fracture  Plan: -All treatment options discussed with the patient including all alternatives, risks, complications.  -I debrided the nails as a courtesy without any comp occasions or bleeding.  Continue with topical antifungal and all the nails.  Monitoring signs or symptoms of infection. -Today I did review his x-rays independently which did reveal a small nondisplaced avulsion fracture of the fifth metatarsal base.  He has no form of immobilization.  He is wearing a stiffer shoe.  However given the pull on the peroneal tendon I would like to put him into a boot he was hesitant for this.  Dispensed a Tri-Lock ankle brace for him to wear.  Ice and elevate.  See him  back next 4 to 6 weeks or sooner if needed for this.  He states that he will call if there is any issues. -Patient encouraged to call the office with any questions, concerns, change in symptoms.   Trula Slade DPM

## 2018-03-22 ENCOUNTER — Encounter: Payer: Self-pay | Admitting: Internal Medicine

## 2018-03-22 ENCOUNTER — Other Ambulatory Visit (INDEPENDENT_AMBULATORY_CARE_PROVIDER_SITE_OTHER): Payer: Medicare Other

## 2018-03-22 ENCOUNTER — Ambulatory Visit (INDEPENDENT_AMBULATORY_CARE_PROVIDER_SITE_OTHER): Payer: Medicare Other | Admitting: Internal Medicine

## 2018-03-22 VITALS — BP 130/70 | HR 66 | Temp 97.7°F | Ht 71.0 in | Wt 213.8 lb

## 2018-03-22 DIAGNOSIS — E559 Vitamin D deficiency, unspecified: Secondary | ICD-10-CM

## 2018-03-22 DIAGNOSIS — Z0000 Encounter for general adult medical examination without abnormal findings: Secondary | ICD-10-CM

## 2018-03-22 DIAGNOSIS — R7303 Prediabetes: Secondary | ICD-10-CM

## 2018-03-22 DIAGNOSIS — C9110 Chronic lymphocytic leukemia of B-cell type not having achieved remission: Secondary | ICD-10-CM

## 2018-03-22 DIAGNOSIS — R7989 Other specified abnormal findings of blood chemistry: Secondary | ICD-10-CM

## 2018-03-22 DIAGNOSIS — E785 Hyperlipidemia, unspecified: Secondary | ICD-10-CM

## 2018-03-22 DIAGNOSIS — N400 Enlarged prostate without lower urinary tract symptoms: Secondary | ICD-10-CM

## 2018-03-22 LAB — CBC WITH DIFFERENTIAL/PLATELET
Basophils Absolute: 0.1 10*3/uL (ref 0.0–0.1)
Basophils Relative: 0.4 % (ref 0.0–3.0)
Eosinophils Absolute: 0.4 10*3/uL (ref 0.0–0.7)
Eosinophils Relative: 1.3 % (ref 0.0–5.0)
HCT: 49.1 % (ref 39.0–52.0)
Hemoglobin: 16.4 g/dL (ref 13.0–17.0)
Lymphocytes Relative: 84.9 % — ABNORMAL HIGH (ref 12.0–46.0)
Lymphs Abs: 28.6 10*3/uL — ABNORMAL HIGH (ref 0.7–4.0)
MCHC: 33.5 g/dL (ref 30.0–36.0)
MCV: 91.1 fl (ref 78.0–100.0)
Monocytes Absolute: 0.9 10*3/uL (ref 0.1–1.0)
Monocytes Relative: 2.5 % — ABNORMAL LOW (ref 3.0–12.0)
NEUTROS ABS: 3.7 10*3/uL (ref 1.4–7.7)
NEUTROS PCT: 10.9 % — AB (ref 43.0–77.0)
PLATELETS: 166 10*3/uL (ref 150.0–400.0)
RBC: 5.38 Mil/uL (ref 4.22–5.81)
RDW: 13.9 % (ref 11.5–15.5)
WBC: 33.7 10*3/uL (ref 4.0–10.5)

## 2018-03-22 LAB — COMPREHENSIVE METABOLIC PANEL
ALT: 24 U/L (ref 0–53)
AST: 24 U/L (ref 0–37)
Albumin: 3.7 g/dL (ref 3.5–5.2)
Alkaline Phosphatase: 75 U/L (ref 39–117)
BUN: 19 mg/dL (ref 6–23)
CO2: 29 mEq/L (ref 19–32)
CREATININE: 1.23 mg/dL (ref 0.40–1.50)
Calcium: 9 mg/dL (ref 8.4–10.5)
Chloride: 103 mEq/L (ref 96–112)
GFR: 56.53 mL/min — ABNORMAL LOW (ref >60.00)
Glucose, Bld: 126 mg/dL — ABNORMAL HIGH (ref 70–99)
Potassium: 4.5 mEq/L (ref 3.5–5.1)
Sodium: 138 mEq/L (ref 135–145)
Total Bilirubin: 0.6 mg/dL (ref 0.2–1.2)
Total Protein: 6.3 g/dL (ref 6.0–8.3)

## 2018-03-22 LAB — LIPID PANEL
Cholesterol: 150 mg/dL (ref 0–200)
HDL: 35.1 mg/dL — ABNORMAL LOW (ref >39.00)
LDL Cholesterol: 88 mg/dL (ref 0–99)
NonHDL: 115.07
Total CHOL/HDL Ratio: 4
Triglycerides: 135 mg/dL (ref 0.0–149.0)
VLDL: 27 mg/dL (ref 0.0–40.0)

## 2018-03-22 LAB — TSH: TSH: 5.52 u[IU]/mL — ABNORMAL HIGH (ref 0.35–4.50)

## 2018-03-22 LAB — HEMOGLOBIN A1C: Hgb A1c MFr Bld: 5.9 % (ref 4.6–6.5)

## 2018-03-22 LAB — VITAMIN D 25 HYDROXY (VIT D DEFICIENCY, FRACTURES): VITD: 34.99 ng/mL (ref 30.00–100.00)

## 2018-03-22 NOTE — Patient Instructions (Signed)

## 2018-03-22 NOTE — Progress Notes (Signed)
Subjective:  Patient ID: Francisco Larson, male    DOB: August 02, 1935  Age: 83 y.o. MRN: [id number]  CC: Annual Exam and Hyperlipidemia   HPI Francisco Larson presents for a CPX.  He works out several times a week on a stationary bike and elliptical.  He has good exercise endurance and denies any recent episodes of CP, DOE, palpitations, edema, or fatigue.  Past Medical History:  Diagnosis Date  . Arthritis   . Hyperlipidemia   . Leukemia, chronic lymphocytic ([place])   . Shingles   . Vitamin D deficiency    Past Surgical History:  Procedure Laterality Date  . EYE SURGERY    . LAPAROSCOPIC TRANS ANAL AND TRANSABDOMINAL RECTAL RESECTION WITH COLOANAL ANASTOMOSIS    . SURGERY OF LIP     tumor  . TONSILLECTOMY    . VASECTOMY      reports that he has never smoked. He has never used smokeless tobacco. He reports that he does not drink alcohol or use drugs. family history includes Asthma in his sister; COPD in his maternal grandmother; Cancer in his mother; Colon cancer in his sister; Diabetes in his paternal grandmother; Emphysema in his mother; Heart disease in his father; Hyperlipidemia in his sister; Mental illness in his paternal grandmother; Stroke in his maternal grandfather and sister. Allergies  Allergen Reactions  . Z-Pak [Azithromycin] Other (See Comments)    Pt stated he thinks this made him dizzy    Outpatient Medications Prior to Visit  Medication Sig Dispense Refill  . aspirin 81 MG tablet Take 81 mg by mouth daily.    [facility] atorvastatin (LIPITOR) 20 MG tablet TAKE 1 TABLET BY MOUTH DAILY 90 tablet 3  . Multiple Vitamins-Minerals (CENTRUM SILVER PO) Take 1 tablet by mouth daily.     . NONFORMULARY OR COMPOUNDED ITEM [facility]: Onychomycosis Nail Lacquer - Fluconazole 2%, Terbinafine 1%, DMSO, apply to affected area 1-2 times daily. 120 each 2   No facility-administered medications prior to visit.     ROS Review of Systems  Constitutional: Positive for  unexpected weight change (wt gain). Negative for appetite change, chills, diaphoresis and fatigue.  HENT: Negative.  Negative for trouble swallowing.   Eyes: Negative.   Respiratory: Negative.  Negative for cough, chest tightness, shortness of breath and wheezing.   Cardiovascular: Negative for chest pain, palpitations and leg swelling.  Gastrointestinal: Negative for abdominal pain, constipation, diarrhea, nausea and vomiting.  Endocrine: Negative for cold intolerance and heat intolerance.  Genitourinary: Negative.  Negative for difficulty urinating, dysuria, penile pain, penile swelling, scrotal swelling, testicular pain and urgency.  Musculoskeletal: Negative.  Negative for arthralgias, back pain, myalgias and neck pain.  Skin: Negative.  Negative for color change and pallor.  Neurological: Negative for dizziness, weakness and light-headedness.  Hematological: Negative for adenopathy. Does not bruise/bleed easily.  Psychiatric/Behavioral: Negative.     Objective:  BP 130/70 (BP Location: Left Arm, Patient Position: Sitting, Cuff Size: Normal)   Pulse 66   Temp 97.7 F (36.5 C) (Oral)   Ht 5\' 11"  (1.803 m)   Wt 213 lb 12 oz (97 kg)   SpO2 96%   BMI 29.81 kg/m   BP Readings from Last 3 Encounters:  03/22/18 130/70  12/18/17 132/70  11/24/17 (!) 141/70    Wt Readings from Last 3 Encounters:  03/22/18 213 lb 12 oz (97 kg)  11/24/17 211 lb 6.4 oz (95.9 kg)  08/20/17 211 lb 3.2 oz (95.8 kg)    Physical Exam  Vitals signs reviewed.  Constitutional:      Appearance: He is not ill-appearing or diaphoretic.  HENT:     Nose: Nose normal. No congestion or rhinorrhea.     Mouth/Throat:     Mouth: Mucous membranes are moist.     Pharynx: Oropharynx is clear. No oropharyngeal exudate or posterior oropharyngeal erythema.  Eyes:     Conjunctiva/sclera: Conjunctivae normal.  Neck:     Musculoskeletal: Normal range of motion and neck supple. No neck rigidity.  Cardiovascular:      Rate and Rhythm: Normal rate and regular rhythm.     Heart sounds: No murmur. No friction rub. No gallop.   Pulmonary:     Effort: Pulmonary effort is normal.     Breath sounds: No stridor. No wheezing, rhonchi or rales.  Abdominal:     General: Bowel sounds are normal.     Palpations: There is no hepatomegaly, splenomegaly or mass.     Tenderness: There is no abdominal tenderness. There is no guarding.  Musculoskeletal: Normal range of motion.        General: No swelling.     Right lower leg: No edema.     Left lower leg: No edema.  Lymphadenopathy:     Cervical: No cervical adenopathy.  Skin:    General: Skin is warm and dry.     Coloration: Skin is not pale.     Findings: No erythema.  Neurological:     General: No focal deficit present.     Mental Status: He is oriented to person, place, and time. Mental status is at baseline.     Lab Results  Component Value Date   WBC 33.7 (HH) 03/22/2018   HGB 16.4 03/22/2018   HCT 49.1 03/22/2018   PLT 166.0 03/22/2018   GLUCOSE 126 (H) 03/22/2018   CHOL 150 03/22/2018   TRIG 135.0 03/22/2018   HDL 35.10 (L) 03/22/2018   LDLCALC 88 03/22/2018   ALT 24 03/22/2018   AST 24 03/22/2018   NA 138 03/22/2018   K 4.5 03/22/2018   CL 103 03/22/2018   CREATININE 1.23 03/22/2018   BUN 19 03/22/2018   CO2 29 03/22/2018   TSH 5.52 (H) 03/22/2018   PSA 3.68 03/18/2017   HGBA1C 5.9 03/22/2018    Dg Foot Complete Right  Result Date: 12/18/2017 CLINICAL DATA:  Lateral right foot pain and bruising following a twisting injury yesterday. EXAM: RIGHT FOOT COMPLETE - 3+ VIEW COMPARISON:  None. FINDINGS: Nondisplaced transverse fracture of the proximal aspect of the base of the 5th metatarsal. Mild inferior posterior calcaneal spur formation. Minimal dorsal tarsal spur formation. IMPRESSION: Nondisplaced fracture of the proximal 5th metatarsal. Electronically Signed   By: Claudie Revering M.D.   On: 12/18/2017 10:47    Assessment & Plan:    Francisco Larson was seen today for annual exam and hyperlipidemia.  Diagnoses and all orders for this visit:  Benign prostatic hyperplasia without lower urinary tract symptoms- He has no symptoms that need to be treated. -     Cancel: PSA; Future  TSH elevation- His TSH is stable but remains mildly elevated.  Clinically he appears euthyroid.  At his age I do not recommend thyroid replacement therapy. -     TSH; Future  Vitamin D deficiency- His vitamin D level is normal now. -     VITAMIN D 25 Hydroxy (Vit-D Deficiency, Fractures); Future  Routine general medical examination at a health care facility  Hyperlipidemia LDL goal <130- He has  achieved his LDL goal and is doing well on the statin. -     Lipid panel; Future -     Comprehensive metabolic panel; Future  CLL (chronic lymphocytic leukemia) ([place])- His white cell count is normal and he is asymptomatic.  Treatment is not indicated. -     CBC with Differential/Platelet; Future  Prediabetes- His A1c is at 5.9%.  He has very mild prediabetes.  Medical therapy is not indicated. -     Comprehensive metabolic panel; Future -     Hemoglobin A1c; Future   I am having Francisco Larson "Francisco Larson" maintain his aspirin, Multiple Vitamins-Minerals (CENTRUM SILVER PO), NONFORMULARY OR COMPOUNDED ITEM, and atorvastatin.  No orders of the defined types were placed in this encounter.  See AVS for instructions about healthy living and anticipatory guidance.  Follow-up: Return in about 1 year (around 03/22/2019).  Scarlette Calico, MD

## 2018-03-23 NOTE — Assessment & Plan Note (Signed)

## 2018-03-29 ENCOUNTER — Other Ambulatory Visit: Payer: Self-pay

## 2018-03-29 ENCOUNTER — Ambulatory Visit: Payer: Medicare Other | Admitting: Podiatry

## 2018-03-29 DIAGNOSIS — B351 Tinea unguium: Secondary | ICD-10-CM | POA: Diagnosis not present

## 2018-03-29 NOTE — Patient Instructions (Signed)

## 2018-04-07 ENCOUNTER — Encounter: Payer: Self-pay | Admitting: Podiatry

## 2018-04-07 NOTE — Progress Notes (Signed)
Subjective:  Patient presents to clinic for follow up with painful, thick, discolored, elongated toenails 1-5 b/l. Patient has been applying topical antifungal from [facility]. He feels he has improvement in appearance of his toenails.   Current Outpatient Medications:  .  aspirin 81 MG tablet, Take 81 mg by mouth daily., Disp: , Rfl:  .  atorvastatin (LIPITOR) 20 MG tablet, TAKE 1 TABLET BY MOUTH DAILY, Disp: 90 tablet, Rfl: 3 .  Multiple Vitamins-Minerals (CENTRUM SILVER PO), Take 1 tablet by mouth daily. , Disp: , Rfl:  .  NONFORMULARY OR COMPOUNDED ITEM, [facility]: Onychomycosis Nail Lacquer - Fluconazole 2%, Terbinafine 1%, DMSO, apply to affected area 1-2 times daily., Disp: 120 each, Rfl: 2   Allergies  Allergen Reactions  . Z-Pak [Azithromycin] Other (See Comments)    Pt stated he thinks this made him dizzy     Objective:  Physical Examination: Neurovascular status intact b/l and symmetrically.  Thick, discolored, dystrophic toenails b/l great and 2nd toes with clearing noted proximal 1/3 of digits.  Assessment: Mycotic nail infection b/l great and 2nd toes  Plan: 1. Debride painful toenailsb/l great and 2nd toes with no iatrogenic bleeding.  2. Continue [facility] antifungal medication daily to affected toenails.  3. Follow up 3 months.

## 2018-06-28 ENCOUNTER — Other Ambulatory Visit: Payer: Self-pay

## 2018-06-28 ENCOUNTER — Ambulatory Visit: Payer: Medicare Other | Admitting: Podiatry

## 2018-06-28 ENCOUNTER — Encounter: Payer: Self-pay | Admitting: Podiatry

## 2018-06-28 VITALS — Temp 97.3°F

## 2018-06-28 DIAGNOSIS — B351 Tinea unguium: Secondary | ICD-10-CM | POA: Diagnosis not present

## 2018-06-28 DIAGNOSIS — M79674 Pain in right toe(s): Secondary | ICD-10-CM | POA: Diagnosis not present

## 2018-06-28 DIAGNOSIS — M79675 Pain in left toe(s): Secondary | ICD-10-CM

## 2018-06-28 NOTE — Patient Instructions (Signed)

## 2018-07-08 NOTE — Progress Notes (Signed)
Subjective:  Francisco Larson presents to clinic today with cc of  painful, thick, discolored, elongated toenails 1-5 b/l that become tender and cannot cut because of thickness. Pain is aggravated when wearing enclosed shoe gear and relieved with periodic professional debridement.  He voices no new pedal concerns on today's visit.  Janith Lima, MD is his PCP.    Current Outpatient Medications:  .  Aspirin Buf,CaCarb-MgCarb-MgO, 81 MG TABS, Adult Low Dose Aspirin 81 mg tablet   1 tablet every day by oral route., Disp: , Rfl:  .  aspirin 81 MG tablet, Take 81 mg by mouth daily., Disp: , Rfl:  .  atorvastatin (LIPITOR) 20 MG tablet, TAKE 1 TABLET BY MOUTH DAILY, Disp: 90 tablet, Rfl: 3 .  Multiple Vitamins-Minerals (CENTRUM SILVER PO), Take 1 tablet by mouth daily. , Disp: , Rfl:  .  NONFORMULARY OR COMPOUNDED ITEM, [facility]: Onychomycosis Nail Lacquer - Fluconazole 2%, Terbinafine 1%, DMSO, apply to affected area 1-2 times daily., Disp: 120 each, Rfl: 2 .  prednisoLONE acetate (PRED FORTE) 1 % ophthalmic suspension, INT 1 GTT INTO  QID OD FOR 1 WEEK. THEN BID FOR 1 WEEK., Disp: , Rfl:    Allergies  Allergen Reactions  . Z-Pak [Azithromycin] Other (See Comments)    Pt stated he thinks this made him dizzy     Objective: Vitals:   06/28/18 1511  Temp: (!) 97.3 F (36.3 C)    Physical Examination:  Vascular Examination: Capillary refill time less than 3 seconds x 10 digits.  Palpable DP/PT pulses b/l.  Digital hair absent b/l.  No edema noted b/l.  Skin temperature gradient WNL b/l.  Dermatological Examination: Skin with normal turgor, texture and tone b/l.  No open wounds b/l.  No interdigital macerations noted b/l.  Elongated, thick, discolored brittle toenails with subungual debris and pain on dorsal palpation of nailbeds b/l great and 2nd toes.  Musculoskeletal Examination: Muscle strength 5/5 to all muscle groups b/l  No pain, crepitus or joint  discomfort with active/passive ROM.  Neurological Examination: Sensation intact 5/5 b/l with 10 gram monofilament.  Vibratory sensation intact b/l.  Proprioceptive sensation intact b/l.  Assessment: Mycotic nail infection with pain 1-5 b/l  Plan: 1. Toenails 1, 2 b/l were debrided in length and girth without iatrogenic laceration. Continue [facility] antifungal medication daily. 2.  Continue soft, supportive shoe gear daily. 3.  Report any pedal injuries to medical professional. 4.  Follow up 3 months. 5.  Patient/POA to call should there be a question/concern in there interim.

## 2018-09-29 ENCOUNTER — Other Ambulatory Visit: Payer: Self-pay

## 2018-09-29 ENCOUNTER — Ambulatory Visit: Payer: Medicare Other | Admitting: Podiatry

## 2018-09-29 ENCOUNTER — Encounter: Payer: Self-pay | Admitting: Podiatry

## 2018-09-29 DIAGNOSIS — B351 Tinea unguium: Secondary | ICD-10-CM

## 2018-09-29 DIAGNOSIS — M79675 Pain in left toe(s): Secondary | ICD-10-CM

## 2018-09-29 DIAGNOSIS — M79674 Pain in right toe(s): Secondary | ICD-10-CM

## 2018-09-29 NOTE — Patient Instructions (Signed)

## 2018-09-30 NOTE — Progress Notes (Signed)
Patient ID: Francisco Larson, male   DOB: Aug 21, 1935, 83 y.o.   MRN: [id number]     Cardiology Office Note   Date:  10/13/2018   ID:  Francisco Larson, DOB 12-Apr-1977, MRN [id number]  PCP:  Francisco Lima, MD  Cardiologist:   Francisco Rouge, MD   No chief complaint on file.     History of Present Illness: 83 y.o. history of PAC;s , CAD by calcium scoring 285 in 2016 HLD and CLL.  WBC typically in 30 range but no Rx needed since 2003.  Strong family history of premature CAD. Very active goes to [employer]. Some arthritis in left knee limits activity Has had chronic ACL tear And sees Francisco Larson   Golfing once/week with large group He taught sport and society at [employer] and knew Francisco [facility] who I took a course with    Calcium Score:  03/13/14 IMPRESSION: Coronary calcium score of 285. This was 11 st percentile for age and sex matched control.  Duplex 08/25/17 plaque no stenosis   Son lives in [place] does rafting tours Daughter lives in [place] just had twin boys   No cardiac complaints    Past Medical History:  Diagnosis Date  . Arthritis   . Hyperlipidemia   . Leukemia, chronic lymphocytic ([place])   . Shingles   . Vitamin D deficiency     Past Surgical History:  Procedure Laterality Date  . EYE SURGERY    . LAPAROSCOPIC TRANS ANAL AND TRANSABDOMINAL RECTAL RESECTION WITH COLOANAL ANASTOMOSIS    . SURGERY OF LIP     tumor  . TONSILLECTOMY    . VASECTOMY       Current Outpatient Medications  Medication Sig Dispense Refill  . aspirin 81 MG tablet Take 81 mg by mouth daily.    Marland Kitchen atorvastatin (LIPITOR) 20 MG tablet TAKE 1 TABLET BY MOUTH DAILY 90 tablet 3  . Multiple Vitamins-Minerals (CENTRUM SILVER PO) Take 1 tablet by mouth daily.     . NONFORMULARY OR COMPOUNDED ITEM [facility]: Onychomycosis Nail Lacquer - Fluconazole 2%, Terbinafine 1%, DMSO, apply to affected area 1-2 times daily. 120 each 2   No current  facility-administered medications for this visit.     Allergies:   Z-pak [azithromycin]    Social History:  The patient  reports that he has never smoked. He has never used smokeless tobacco. He reports that he does not drink alcohol or use drugs.   Family History:  The patient's family history includes Asthma in his sister; COPD in his maternal grandmother; Cancer in his mother; Colon cancer in his sister; Diabetes in his paternal grandmother; Emphysema in his mother; Heart disease in his father; Hyperlipidemia in his sister; Mental illness in his paternal grandmother; Stroke in his maternal grandfather and sister.    ROS:  Please see the history of present illness.   Otherwise, review of systems are positive for none.   All other systems are reviewed and negative.    PHYSICAL EXAM: VS:  BP 136/88   Pulse 79   Ht 5' 11.5" (1.816 m)   Wt 211 lb 6.4 oz (95.9 kg)   SpO2 96%   BMI 29.07 kg/m  , BMI Body mass index is 29.07 kg/m. Affect appropriate Healthy:  appears stated age 83: normal Neck supple with no adenopathy JVP normal left  bruits no thyromegaly Lungs clear with no wheezing and good diaphragmatic motion Heart:  S1/S2 no murmur, no rub,  gallop or click PMI normal Abdomen: benighn, BS positve, no tenderness, no AAA no bruit.  No HSM or HJR Distal pulses intact with no bruits No edema Neuro non-focal Skin warm and dry No muscular weakness     EKG:   08/20/17 SR rate 66 normal 10/13/18 SR rate 79 PAC otherwise normal    Recent Labs: 03/22/2018: ALT 24; BUN 19; Creatinine, Ser 1.23; Hemoglobin 16.4; Platelets 166.0; Potassium 4.5; Sodium 138; TSH 5.52    Lipid Panel    Component Value Date/Time   CHOL 150 03/22/2018 0833   CHOL 136 02/17/2017 0832   TRIG 135.0 03/22/2018 0833   HDL 35.10 (L) 03/22/2018 0833   HDL 29 (L) 02/17/2017 0832   CHOLHDL 4 03/22/2018 0833   VLDL 27.0 03/22/2018 0833   LDLCALC 88 03/22/2018 0833   LDLCALC 82 02/17/2017 0832       Wt Readings from Last 3 Encounters:  10/13/18 211 lb 6.4 oz (95.9 kg)  03/22/18 213 lb 12 oz (97 kg)  11/24/17 211 lb 6.4 oz (95.9 kg)      Other studies Reviewed: Additional studies/ records that were reviewed today include: records Francisco Larson and [facility] office notes .    ASSESSMENT AND PLAN:  1. Family History CAD: Active and asymptomatic  Baseline ECG is normal . 2016 Calcium score 285 which is 61st percentile  Stable no agnina normal ECG see below  2. Chol:  On statin LFTls ok  Lab Results  Component Value Date   LDLCALC 88 03/22/2018     3. CLL  WBC 33.7   Been like that for 5 years.  F/u oncology 4. Left bruit:   08/25/17 Duplex without significant stenosis ASA / Statin      Current medicines are reviewed at length with the patient today.  The patient does not have concerns regarding medicines.  The following changes have been made:     Labs/ tests ordered today include:  None    No orders of the defined types were placed in this encounter.    Disposition:   FU with  Me a year     Signed, Francisco Rouge, MD  10/13/2018 9:44 AM    [facility] [place], [place], [place]  [address] Phone: [phone]; Fax: [phone]

## 2018-10-07 NOTE — Progress Notes (Signed)
Subjective: Francisco Larson is seen today for follow up painful, elongated, thickened toenails b/l feet that he cannot cut. Pain interferes with daily activities. Aggravating factor includes wearing enclosed shoe gear and relieved with periodic debridement.  Current Outpatient Medications on File Prior to Visit  Medication Sig  . aspirin 81 MG tablet Take 81 mg by mouth daily.  . Aspirin Buf,CaCarb-MgCarb-MgO, 81 MG TABS Adult Low Dose Aspirin 81 mg tablet   1 tablet every day by oral route.  Marland Kitchen atorvastatin (LIPITOR) 20 MG tablet TAKE 1 TABLET BY MOUTH DAILY  . Multiple Vitamins-Minerals (CENTRUM SILVER PO) Take 1 tablet by mouth daily.   . NONFORMULARY OR COMPOUNDED ITEM [facility]: Onychomycosis Nail Lacquer - Fluconazole 2%, Terbinafine 1%, DMSO, apply to affected area 1-2 times daily.  . prednisoLONE acetate (PRED FORTE) 1 % ophthalmic suspension INT 1 GTT INTO  QID OD FOR 1 WEEK. THEN BID FOR 1 WEEK.   No current facility-administered medications on file prior to visit.      Allergies  Allergen Reactions  . Z-Pak [Azithromycin] Other (See Comments)    Pt stated he thinks this made him dizzy     Objective:  Vascular Examination: Capillary refill time less than 3 seconds x 10 digits.  Dorsalis pedis present b/l.  Posterior tibial pulses present b/l.  Digital hair present x 10 digits.  Skin temperature gradient WNL b/l.   Dermatological Examination: Skin with normal turgor, texture and tone b/l.  Toenails 1, 2 b/l discolored, thick, dystrophic with subungual debris and pain with palpation to nailbeds due to thickness of nails.  Musculoskeletal: Muscle strength 5/5 to all LE muscle groups.  No gross bony deformities b/l.  No pain, crepitus or joint limitation noted with ROM.   Neurological Examination: Protective sensation intact with 10 gram monofilament bilaterally.  Epicritic sensation present bilaterally.  Vibratory sensation intact bilaterally.    Assessment: Painful onychomycosis toenails 1, 2 b/l  Plan: 1. Toenails 1, 2 b/l were debrided in length and girth without iatrogenic bleeding. 2. Patient to continue soft, supportive shoe gear 3. Patient to report any pedal injuries to medical professional immediately. 4. Follow up 3 months.  5. Patient/POA to call should there be a concern in the interim.

## 2018-10-13 ENCOUNTER — Ambulatory Visit (INDEPENDENT_AMBULATORY_CARE_PROVIDER_SITE_OTHER): Payer: Medicare Other | Admitting: Cardiovascular Disease

## 2018-10-13 ENCOUNTER — Encounter: Payer: Self-pay | Admitting: Cardiovascular Disease

## 2018-10-13 ENCOUNTER — Other Ambulatory Visit: Payer: Self-pay

## 2018-10-13 VITALS — BP 136/88 | HR 79 | Ht 71.5 in | Wt 211.4 lb

## 2018-10-13 DIAGNOSIS — Z8249 Family history of ischemic heart disease and other diseases of the circulatory system: Secondary | ICD-10-CM | POA: Diagnosis not present

## 2018-10-13 NOTE — Patient Instructions (Addendum)

## 2018-10-26 ENCOUNTER — Other Ambulatory Visit: Payer: Self-pay | Admitting: Cardiovascular Disease

## 2018-11-12 ENCOUNTER — Other Ambulatory Visit: Payer: Self-pay

## 2018-11-12 DIAGNOSIS — C9110 Chronic lymphocytic leukemia of B-cell type not having achieved remission: Secondary | ICD-10-CM

## 2018-11-15 ENCOUNTER — Other Ambulatory Visit: Payer: Self-pay

## 2018-11-15 ENCOUNTER — Inpatient Hospital Stay: Payer: Medicare Other | Attending: Oncology

## 2018-11-15 DIAGNOSIS — Z23 Encounter for immunization: Secondary | ICD-10-CM | POA: Insufficient documentation

## 2018-11-15 DIAGNOSIS — C9110 Chronic lymphocytic leukemia of B-cell type not having achieved remission: Secondary | ICD-10-CM | POA: Diagnosis present

## 2018-11-15 LAB — CBC WITH DIFFERENTIAL (CANCER CENTER ONLY)
Abs Immature Granulocytes: 0.04 10*3/uL (ref 0.00–0.07)
Basophils Absolute: 0.1 10*3/uL (ref 0.0–0.1)
Basophils Relative: 0 %
Eosinophils Absolute: 0.3 10*3/uL (ref 0.0–0.5)
Eosinophils Relative: 1 %
HCT: 48.8 % (ref 39.0–52.0)
Hemoglobin: 16.4 g/dL (ref 13.0–17.0)
Immature Granulocytes: 0 %
Lymphocytes Relative: 83 %
Lymphs Abs: 24.3 10*3/uL — ABNORMAL HIGH (ref 0.7–4.0)
MCH: 30.7 pg (ref 26.0–34.0)
MCHC: 33.6 g/dL (ref 30.0–36.0)
MCV: 91.2 fL (ref 80.0–100.0)
Monocytes Absolute: 0.7 10*3/uL (ref 0.1–1.0)
Monocytes Relative: 3 %
Neutro Abs: 3.8 10*3/uL (ref 1.7–7.7)
Neutrophils Relative %: 13 %
Platelet Count: 165 10*3/uL (ref 150–400)
RBC: 5.35 MIL/uL (ref 4.22–5.81)
RDW: 13.1 % (ref 11.5–15.5)
WBC Count: 29.2 10*3/uL — ABNORMAL HIGH (ref 4.0–10.5)
nRBC: 0.1 % (ref 0.0–0.2)

## 2018-11-15 LAB — CMP (CANCER CENTER ONLY)
ALT: 25 U/L (ref 0–44)
AST: 25 U/L (ref 15–41)
Albumin: 3.2 g/dL — ABNORMAL LOW (ref 3.5–5.0)
Alkaline Phosphatase: 89 U/L (ref 38–126)
Anion gap: 8 (ref 5–15)
BUN: 20 mg/dL (ref 8–23)
CO2: 22 mmol/L (ref 22–32)
Calcium: 9 mg/dL (ref 8.9–10.3)
Chloride: 109 mmol/L (ref 98–111)
Creatinine: 1.04 mg/dL (ref 0.61–1.24)
GFR, Est AFR Am: >60 mL/min (ref >60)
GFR, Estimated: >60 mL/min (ref >60)
Glucose, Bld: 116 mg/dL — ABNORMAL HIGH (ref 70–99)
Potassium: 4.6 mmol/L (ref 3.5–5.1)
Sodium: 139 mmol/L (ref 135–145)
Total Bilirubin: 0.5 mg/dL (ref 0.3–1.2)
Total Protein: 5.9 g/dL — ABNORMAL LOW (ref 6.5–8.1)

## 2018-11-15 LAB — LACTATE DEHYDROGENASE: LDH: 163 U/L (ref 98–192)

## 2018-11-16 LAB — BETA 2 MICROGLOBULIN, SERUM: Beta-2 Microglobulin: 2.9 mg/L — ABNORMAL HIGH (ref 0.6–2.4)

## 2018-11-20 NOTE — Progress Notes (Signed)
[facility]  [phone] [phone]    ID: Francisco Larson   DOB: 10-17-1935  MR#: [id number]  [id number]  Patient Care Team: Francisco Lima, MD as PCP - General (Internal Medicine) Francisco Hector, MD as Consulting Physician (Cardiology) Francisco Larson, Francisco Dad, MD as Consulting Physician (Oncology) Francisco Overall, MD as Consulting Physician (General Surgery) Francisco Crome, MD as Consulting Physician (Cardiology) Francisco Monarch, MD as Consulting Physician (Dermatology) OTHER MD: Francisco Jordan MD    INTERVAL HISTORY: Francisco Larson returns today for follow-up of his chronic lymphoid leukemia.  There has been no significant weight loss, unusual fatigue, drenching sweats, fever, rash, pruritus or adenopathy that he is aware of.  Lab work up to the last year: Results for Francisco Larson, Francisco Larson (MRN [id number]) as of 11/24/2017 08:50  Ref. Range 09/10/2016 10:24 03/18/2017 10:21 04/22/2017 16:01 11/17/2017 08:16  WBC Latest Ref Range: 4.0 - 10.5 K/uL 34.5 (H) 33.6 cH (HH) 31.2 (H) 29.2 (H)   Current lab work shows no anemia or thrombocytopenia and a total white cell count again under 30,000   REVIEW OF SYSTEMS: Francisco Larson walks for exercise, usually between 2-1/2 and 3 miles a day, usually 5 or 6 days a week.  He also plays golf at least once a week.  He tells me his 61 year old sister recently had knee replacement and is doing fine.  He and his family are taking appropriate pandemic precautions.  A detailed review of systems was otherwise noncontributory  PAST MEDICAL HISTORY: Past Medical History:  Diagnosis Date  . Arthritis   . Hyperlipidemia   . Leukemia, chronic lymphocytic (Francisco Larson)   . Shingles   . Vitamin D deficiency     PAST SURGICAL HISTORY: Past Surgical History:  Procedure Laterality Date  . EYE SURGERY    . LAPAROSCOPIC TRANS ANAL AND TRANSABDOMINAL RECTAL RESECTION WITH COLOANAL ANASTOMOSIS    . SURGERY OF LIP     tumor  . TONSILLECTOMY    . VASECTOMY       FAMILY HISTORY Family History  Problem Relation Age of Onset  . Colon cancer Sister   . Hyperlipidemia Sister   . Stroke Sister   . Heart disease Father   . Emphysema Mother        never smoker, but exposed to 2nd hand from spouse  . Cancer Mother   . COPD Maternal Grandmother        never smoker  . Stroke Maternal Grandfather   . Diabetes Paternal Grandmother   . Mental illness Paternal Grandmother   . Asthma Sister    The patient's mother died at the age of 102. The patient's father died at the age of 69 from heart disease. He had no brothers. His only sister, currently 50 years old, has a history of late breast cancer and late middle-aged colon cancer.  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 He has been married to [place] 50+ years, and their children are [place] lives in [place] and has 4 children, and [place] who lives in [place] and has no children. He recently became a great-grandfather to two twin girls, who were born in August 2019. Francisco Larson is an elder at [employer].    ADVANCED DIRECTIVES: in place  HEALTH MAINTENANCE: Social History   Tobacco Use  . Smoking status: Never Smoker  . Smokeless tobacco: Never Used  Substance Use Topics  . Alcohol use:  No  . Drug use: No     Colonoscopy:10/18/2014  PSA: 3.13 03/17/2016  Lipid panel:  Allergies  Allergen Reactions  . Z-Pak [Azithromycin] Other (See Comments)    Pt stated he thinks this made him dizzy    Current Outpatient Medications  Medication Sig Dispense Refill  . aspirin 81 MG tablet Take 81 mg by mouth daily.    Marland Kitchen atorvastatin (LIPITOR) 20 MG tablet TAKE 1 TABLET BY MOUTH DAILY 90 tablet 3  . Multiple Vitamins-Minerals (CENTRUM SILVER PO) Take 1 tablet by mouth daily.     . NONFORMULARY OR COMPOUNDED ITEM [facility]: Onychomycosis Nail Lacquer - Fluconazole 2%, Terbinafine 1%,  DMSO, apply to affected area 1-2 times daily. 120 each 2   No current facility-administered medications for this visit.     OBJECTIVE: Middle-aged white man who appears well  Vitals:   11/22/18 1214  BP: 131/70  Pulse: 69  Resp: 18  Temp: 98.7 F (37.1 C)  SpO2: 98%     Body mass index is 29.13 kg/m.    ECOG FS: 0   Sclerae unicteric, EOMs intact Oropharynx clear and moist No cervical or supraclavicular adenopathy, no axillary or inguinal adenopathy. Lungs no rales or rhonchi Heart regular rate and rhythm Abd soft, nontender, positive bowel sounds, no palpable splenomegaly MSK no focal spinal tenderness, no upper extremity lymphedema Neuro: nonfocal, well oriented, appropriate affect   LAB:  Lab Results  Component Value Date   WBC 29.2 (H) 11/15/2018   NEUTROABS 3.8 11/15/2018   HGB 16.4 11/15/2018   HCT 48.8 11/15/2018   MCV 91.2 11/15/2018   PLT 165 11/15/2018      Chemistry      Component Value Date/Time   NA 139 11/15/2018 0809   NA 137 09/10/2016 1024   K 4.6 11/15/2018 0809   K 4.6 09/10/2016 1024   CL 109 11/15/2018 0809   CL 108 (H) 05/26/2012 0804   CO2 22 11/15/2018 0809   CO2 26 09/10/2016 1024   BUN 20 11/15/2018 0809   BUN 17.3 09/10/2016 1024   CREATININE 1.04 11/15/2018 0809   CREATININE 1.1 09/10/2016 1024      Component Value Date/Time   CALCIUM 9.0 11/15/2018 0809   CALCIUM 8.9 09/10/2016 1024   ALKPHOS 89 11/15/2018 0809   ALKPHOS 80 09/10/2016 1024   AST 25 11/15/2018 0809   AST 25 09/10/2016 1024   ALT 25 11/15/2018 0809   ALT 23 09/10/2016 1024   BILITOT 0.5 11/15/2018 0809   BILITOT 0.62 09/10/2016 1024      No results found for: LABCA2  No components found for: [id number]  No results for input(s): INR in the last 168 hours.  Urinalysis    Component Value Date/Time   COLORURINE YELLOW 03/17/2016 0936    STUDIES: No results found.   ASSESSMENT: 83 y.o. Francisco Larson man with a history of well-differentiated  lymphocytic lymphoma/chronic lymphoid leukemia originally diagnosed April 1997, treated with Rituxan in 2001 and 2004, and not requiring treatment since that time.   PLAN:  Francisco Larson is now 23 years out from initial diagnosis of chronic lymphoid leukemia.  He has not required treatment for the last 16 years.  His counts remain excellent and no intervention is needed at this point.  I commended his excellent exercise program.  We discussed the current pandemic and I encouraged him to receive the coronavirus vaccine as soon as 1 becomes available.  Gave him his flu shot today.  He will return to  see me in 1 year.  He knows to call for any other issue that may develop before the next visit.    Demetrice Combes, Francisco Dad, MD  11/22/18 12:50 PM Medical Oncology and Hematology [facility] [address] [place], [place] [address] Tel. [phone]    Fax. [phone]  I, Margit Banda am acting as a scribe for Chauncey Cruel, MD.   I, Lurline Del MD, have reviewed the above documentation for accuracy and completeness, and I agree with the above.

## 2018-11-22 ENCOUNTER — Other Ambulatory Visit: Payer: Self-pay

## 2018-11-22 ENCOUNTER — Inpatient Hospital Stay: Payer: Medicare Other | Admitting: Oncology

## 2018-11-22 VITALS — BP 131/70 | HR 69 | Temp 98.7°F | Resp 18 | Ht 71.5 in | Wt 211.8 lb

## 2018-11-22 DIAGNOSIS — C9110 Chronic lymphocytic leukemia of B-cell type not having achieved remission: Secondary | ICD-10-CM | POA: Diagnosis not present

## 2018-11-22 DIAGNOSIS — Z0000 Encounter for general adult medical examination without abnormal findings: Secondary | ICD-10-CM

## 2018-11-22 MED ORDER — INFLUENZA VAC A&B SA ADJ QUAD 0.5 ML IM PRSY
0.5000 mL | PREFILLED_SYRINGE | Freq: Once | INTRAMUSCULAR | Status: AC
  Administered 2018-11-22: 0.5 mL via INTRAMUSCULAR

## 2018-11-22 MED ORDER — INFLUENZA VAC A&B SA ADJ QUAD 0.5 ML IM PRSY
PREFILLED_SYRINGE | INTRAMUSCULAR | Status: AC
  Filled 2018-11-22: qty 0.5

## 2018-11-23 ENCOUNTER — Telehealth: Payer: Self-pay | Admitting: Oncology

## 2018-11-23 NOTE — Telephone Encounter (Signed)
I talk with patient regarding schedule  

## 2018-12-08 ENCOUNTER — Other Ambulatory Visit: Payer: Self-pay | Admitting: Dermatology

## 2018-12-08 DIAGNOSIS — C4492 Squamous cell carcinoma of skin, unspecified: Secondary | ICD-10-CM

## 2018-12-08 HISTORY — DX: Squamous cell carcinoma of skin, unspecified: C44.92

## 2018-12-29 ENCOUNTER — Encounter: Payer: Self-pay | Admitting: Podiatry

## 2018-12-29 ENCOUNTER — Other Ambulatory Visit: Payer: Self-pay

## 2018-12-29 ENCOUNTER — Ambulatory Visit: Payer: Medicare Other | Admitting: Podiatry

## 2018-12-29 DIAGNOSIS — B351 Tinea unguium: Secondary | ICD-10-CM | POA: Diagnosis not present

## 2018-12-29 DIAGNOSIS — M79675 Pain in left toe(s): Secondary | ICD-10-CM | POA: Diagnosis not present

## 2018-12-29 DIAGNOSIS — M79674 Pain in right toe(s): Secondary | ICD-10-CM | POA: Diagnosis not present

## 2018-12-29 NOTE — Patient Instructions (Signed)

## 2019-01-01 NOTE — Progress Notes (Signed)
Subjective: Francisco Larson is seen today for follow up painful, elongated, thickened toenails bilateral feet that she cannot cut. Pain interferes with daily activities. Aggravating factor includes wearing enclosed shoe gear and relieved with periodic debridement.  He has been using topical antifungal on his toenails daily for the past 18 months  Medications reviewed in chart.  Allergies  Allergen Reactions  . Z-Pak [Azithromycin] Other (See Comments)    Pt stated he thinks this made him dizzy    Objective:  Vascular Examination: Capillary refill time to digits <3 seconds b/l.  Dorsalis pedis and posterior tibial pulses present b/l.  Digital hair present b/l.  Skin temperature gradient WNL b/l.   Dermatological Examination: Skin with normal turgor, texture and tone b/l.  Toenails 1, 2  b/l with slight improvement in color. Thick and elongated with subungual debris.  Musculoskeletal: Muscle strength 5/5 to all LE muscle groups b/l.  No gross bony deformities b/l.  No pain, crepitus or joint limitation noted with ROM.   Neurological Examination: Protective sensation intact 5/5 with 10 gram monofilament bilaterally  Assessment: Painful onychomycosis toenails 1, 2 b/l   Plan: 1. Toenails 1,2  b/l were debrided in length and girth without iatrogenic bleeding. 2. Patient to continue soft, supportive shoe gear daily. 3. Patient to report any pedal injuries to medical professional immediately. 4. Follow up 4 months.  5. Patient/POA to call should there be a concern in the interim.

## 2019-01-05 ENCOUNTER — Ambulatory Visit: Payer: Medicare Other | Attending: Internal Medicine

## 2019-01-05 DIAGNOSIS — Z20822 Contact with and (suspected) exposure to covid-19: Secondary | ICD-10-CM

## 2019-01-06 LAB — NOVEL CORONAVIRUS, NAA: SARS-CoV-2, NAA: NOT DETECTED

## 2019-01-28 ENCOUNTER — Ambulatory Visit: Payer: Medicare Other | Attending: Internal Medicine

## 2019-01-28 DIAGNOSIS — Z23 Encounter for immunization: Secondary | ICD-10-CM | POA: Insufficient documentation

## 2019-01-28 NOTE — Progress Notes (Signed)
   Covid-19 Vaccination Clinic  Name:  Francisco Larson    MRN: [id number] DOB: 1935-05-17  01/28/2019  Francisco Larson was observed post Covid-19 immunization for 15 minutes without incidence. He was provided with Vaccine Information Sheet and instruction to access the V-Safe system.   Francisco Larson was instructed to call 911 with any severe reactions post vaccine: Marland Kitchen Difficulty breathing  . Swelling of your face and throat  . A fast heartbeat  . A bad rash all over your body  . Dizziness and weakness    Immunizations Administered    Name Date Dose VIS Date Route   Pfizer COVID-19 Vaccine 01/28/2019  8:15 AM 0.3 mL 12/24/2018 Intramuscular   Manufacturer: [employer]   Lot: [id number]   [place]: [id number]

## 2019-02-17 ENCOUNTER — Ambulatory Visit: Payer: Medicare PPO | Attending: Internal Medicine

## 2019-02-17 DIAGNOSIS — Z23 Encounter for immunization: Secondary | ICD-10-CM | POA: Insufficient documentation

## 2019-02-17 NOTE — Progress Notes (Signed)
   Covid-19 Vaccination Clinic  Name:  Francisco Larson    MRN: [id number] DOB: 1935/10/03  02/17/2019  [name] was observed post Covid-19 immunization for 15 minutes without incidence. He was provided with Vaccine Information Sheet and instruction to access the V-Safe system.   [name] was instructed to call 911 with any severe reactions post vaccine: Marland Kitchen Difficulty breathing  . Swelling of your face and throat  . A fast heartbeat  . A bad rash all over your body  . Dizziness and weakness    Immunizations Administered    Name Date Dose VIS Date Route   Pfizer COVID-19 Vaccine 02/17/2019  9:33 AM 0.3 mL 12/24/2018 Intramuscular   Manufacturer: [place]   Lot: [id number]   [place]: [id number]

## 2019-03-28 ENCOUNTER — Encounter: Payer: Self-pay | Admitting: Internal Medicine

## 2019-03-28 ENCOUNTER — Other Ambulatory Visit: Payer: Self-pay

## 2019-03-28 ENCOUNTER — Ambulatory Visit (INDEPENDENT_AMBULATORY_CARE_PROVIDER_SITE_OTHER): Payer: Medicare PPO | Admitting: Internal Medicine

## 2019-03-28 VITALS — BP 126/80 | HR 71 | Temp 97.5°F | Resp 16 | Ht 71.5 in | Wt 213.4 lb

## 2019-03-28 DIAGNOSIS — E785 Hyperlipidemia, unspecified: Secondary | ICD-10-CM

## 2019-03-28 DIAGNOSIS — Z0000 Encounter for general adult medical examination without abnormal findings: Secondary | ICD-10-CM

## 2019-03-28 DIAGNOSIS — R7303 Prediabetes: Secondary | ICD-10-CM | POA: Diagnosis not present

## 2019-03-28 DIAGNOSIS — R7989 Other specified abnormal findings of blood chemistry: Secondary | ICD-10-CM

## 2019-03-28 DIAGNOSIS — N400 Enlarged prostate without lower urinary tract symptoms: Secondary | ICD-10-CM

## 2019-03-28 DIAGNOSIS — E559 Vitamin D deficiency, unspecified: Secondary | ICD-10-CM | POA: Diagnosis not present

## 2019-03-28 DIAGNOSIS — N1831 Chronic kidney disease, stage 3a: Secondary | ICD-10-CM | POA: Diagnosis not present

## 2019-03-28 DIAGNOSIS — C9110 Chronic lymphocytic leukemia of B-cell type not having achieved remission: Secondary | ICD-10-CM

## 2019-03-28 LAB — CBC WITH DIFFERENTIAL/PLATELET
Basophils Absolute: 0.1 10*3/uL (ref 0.0–0.1)
Basophils Relative: 0.3 % (ref 0.0–3.0)
Eosinophils Absolute: 0.2 10*3/uL (ref 0.0–0.7)
Eosinophils Relative: 0.9 % (ref 0.0–5.0)
HCT: 46.5 % (ref 39.0–52.0)
Hemoglobin: 15.6 g/dL (ref 13.0–17.0)
Lymphocytes Relative: 83 % — ABNORMAL HIGH (ref 12.0–46.0)
Lymphs Abs: 20.8 10*3/uL — ABNORMAL HIGH (ref 0.7–4.0)
MCHC: 33.6 g/dL (ref 30.0–36.0)
MCV: 91.1 fl (ref 78.0–100.0)
Monocytes Absolute: 0.8 10*3/uL (ref 0.1–1.0)
Monocytes Relative: 3.1 % (ref 3.0–12.0)
Neutro Abs: 3.2 10*3/uL (ref 1.4–7.7)
Neutrophils Relative %: 12.7 % — ABNORMAL LOW (ref 43.0–77.0)
Platelets: 159 10*3/uL (ref 150.0–400.0)
RBC: 5.1 Mil/uL (ref 4.22–5.81)
RDW: 13.8 % (ref 11.5–15.5)
WBC: 25.1 10*3/uL (ref 4.0–10.5)

## 2019-03-28 LAB — BASIC METABOLIC PANEL
BUN: 19 mg/dL (ref 6–23)
CO2: 25 mEq/L (ref 19–32)
Calcium: 8.6 mg/dL (ref 8.4–10.5)
Chloride: 106 mEq/L (ref 96–112)
Creatinine, Ser: 1.25 mg/dL (ref 0.40–1.50)
GFR: 55.34 mL/min — ABNORMAL LOW (ref >60.00)
Glucose, Bld: 115 mg/dL — ABNORMAL HIGH (ref 70–99)
Potassium: 4.7 mEq/L (ref 3.5–5.1)
Sodium: 139 mEq/L (ref 135–145)

## 2019-03-28 LAB — TSH: TSH: 5.91 u[IU]/mL — ABNORMAL HIGH (ref 0.35–4.50)

## 2019-03-28 LAB — LIPID PANEL
Cholesterol: 120 mg/dL (ref 0–200)
HDL: 26.8 mg/dL — ABNORMAL LOW (ref >39.00)
LDL Cholesterol: 75 mg/dL (ref 0–99)
NonHDL: 93.23
Total CHOL/HDL Ratio: 4
Triglycerides: 89 mg/dL (ref 0.0–149.0)
VLDL: 17.8 mg/dL (ref 0.0–40.0)

## 2019-03-28 LAB — HEMOGLOBIN A1C: Hgb A1c MFr Bld: 6 % (ref 4.6–6.5)

## 2019-03-28 LAB — HEPATIC FUNCTION PANEL
ALT: 22 U/L (ref 0–53)
AST: 22 U/L (ref 0–37)
Albumin: 3.4 g/dL — ABNORMAL LOW (ref 3.5–5.2)
Alkaline Phosphatase: 85 U/L (ref 39–117)
Bilirubin, Direct: 0.1 mg/dL (ref 0.0–0.3)
Total Bilirubin: 0.7 mg/dL (ref 0.2–1.2)
Total Protein: 5.7 g/dL — ABNORMAL LOW (ref 6.0–8.3)

## 2019-03-28 LAB — PSA: PSA: 3.38 ng/mL (ref 0.10–4.00)

## 2019-03-28 LAB — VITAMIN D 25 HYDROXY (VIT D DEFICIENCY, FRACTURES): VITD: 39.67 ng/mL (ref 30.00–100.00)

## 2019-03-28 NOTE — Progress Notes (Signed)
Subjective:  Patient ID: Francisco Larson, male    DOB: Apr 02, 1935  Age: 83 y.o. MRN: [id number]  CC: Annual Exam and Hyperlipidemia   This visit occurred during the SARS-CoV-2 public health emergency.  Safety protocols were in place, including screening questions prior to the visit, additional usage of staff PPE, and extensive cleaning of exam room while observing appropriate contact time as indicated for disinfecting solutions.    HPI Francisco Larson presents for a CPX.  He walks 3 miles a day and tells me his endurance is good.  He can climb hills without experiencing any chest pain, shortness of breath, dizziness, lightheadedness, palpitations, edema, or fatigue.  Outpatient Medications Prior to Visit  Medication Sig Dispense Refill  . aspirin 81 MG tablet Take 81 mg by mouth daily.    Marland Kitchen atorvastatin (LIPITOR) 20 MG tablet TAKE 1 TABLET BY MOUTH DAILY 90 tablet 3  . betamethasone dipropionate 0.05 % cream APR OF HANDS    . Multiple Vitamins-Minerals (CENTRUM SILVER PO) Take 1 tablet by mouth daily.     . NONFORMULARY OR COMPOUNDED ITEM [facility]: Onychomycosis Nail Lacquer - Fluconazole 2%, Terbinafine 1%, DMSO, apply to affected area 1-2 times daily. 120 each 2   No facility-administered medications prior to visit.    ROS Review of Systems  Constitutional: Negative for diaphoresis, fatigue and unexpected weight change.  HENT: Negative.   Eyes: Negative.   Respiratory: Negative for cough, chest tightness, shortness of breath and wheezing.   Cardiovascular: Negative for chest pain, palpitations and leg swelling.  Gastrointestinal: Negative for abdominal pain, constipation, diarrhea, nausea and vomiting.  Endocrine: Negative.  Negative for cold intolerance and heat intolerance.  Genitourinary: Negative.  Negative for difficulty urinating, penile swelling, scrotal swelling, testicular pain and urgency.  Musculoskeletal: Negative.  Negative for arthralgias and  myalgias.  Skin: Negative.  Negative for pallor.  Neurological: Negative.   Hematological: Negative for adenopathy. Does not bruise/bleed easily.  Psychiatric/Behavioral: Negative.     Objective:  BP 126/80 (BP Location: Left Arm, Patient Position: Sitting, Cuff Size: Normal)   Pulse 71   Temp (!) 97.5 F (36.4 C) (Oral)   Resp 16   Ht 5' 11.5" (1.816 m)   Wt 213 lb 6 oz (96.8 kg)   SpO2 97%   BMI 29.35 kg/m   BP Readings from Last 3 Encounters:  03/28/19 126/80  11/22/18 131/70  10/13/18 136/88    Wt Readings from Last 3 Encounters:  03/28/19 213 lb 6 oz (96.8 kg)  11/22/18 211 lb 12.8 oz (96.1 kg)  10/13/18 211 lb 6.4 oz (95.9 kg)    Physical Exam Vitals reviewed.  Constitutional:      Appearance: Normal appearance.  HENT:     Nose: Nose normal.     Mouth/Throat:     Mouth: Mucous membranes are moist.  Eyes:     General: No scleral icterus.    Conjunctiva/sclera: Conjunctivae normal.  Cardiovascular:     Rate and Rhythm: Normal rate and regular rhythm.     Heart sounds: No murmur.  Pulmonary:     Effort: Pulmonary effort is normal.     Breath sounds: No stridor. No wheezing or rhonchi.  Abdominal:     General: Abdomen is flat. Bowel sounds are normal. There is no distension.     Palpations: Abdomen is soft. There is no hepatomegaly, splenomegaly or mass.     Tenderness: There is no abdominal tenderness.     Hernia: There is no  hernia in the left inguinal area or right inguinal area.  Genitourinary:    Pubic Area: No rash.      Penis: Normal. No discharge, swelling or lesions.      Testes: Normal.        Right: Mass, tenderness or swelling not present.        Left: Mass or swelling not present.     Epididymis:     Right: Normal. Not enlarged. No mass.     Left: Normal. Not enlarged. No mass.     Prostate: Enlarged. Not tender and no nodules present.     Rectum: Normal. Guaiac result negative. No mass, tenderness, anal fissure, external hemorrhoid or  internal hemorrhoid. Normal anal tone.  Musculoskeletal:        General: Normal range of motion.     Cervical back: Neck supple.     Right lower leg: No edema.     Left lower leg: No edema.  Lymphadenopathy:     Cervical: No cervical adenopathy.     Lower Body: No right inguinal adenopathy. No left inguinal adenopathy.  Skin:    General: Skin is warm and dry.     Findings: No rash.  Neurological:     General: No focal deficit present.     Mental Status: He is alert.  Psychiatric:        Mood and Affect: Mood normal.        Behavior: Behavior normal.     Lab Results  Component Value Date   WBC 25.1 Repeated and verified X2. (HH) 03/28/2019   HGB 15.6 03/28/2019   HCT 46.5 03/28/2019   PLT 159.0 03/28/2019   GLUCOSE 115 (H) 03/28/2019   CHOL 120 03/28/2019   TRIG 89.0 03/28/2019   HDL 26.80 (L) 03/28/2019   LDLCALC 75 03/28/2019   ALT 22 03/28/2019   AST 22 03/28/2019   NA 139 03/28/2019   K 4.7 03/28/2019   CL 106 03/28/2019   CREATININE 1.25 03/28/2019   BUN 19 03/28/2019   CO2 25 03/28/2019   TSH 5.91 (H) 03/28/2019   PSA 3.38 03/28/2019   HGBA1C 6.0 03/28/2019    DG Foot Complete Right  Result Date: 12/18/2017 CLINICAL DATA:  Lateral right foot pain and bruising following a twisting injury yesterday. EXAM: RIGHT FOOT COMPLETE - 3+ VIEW COMPARISON:  None. FINDINGS: Nondisplaced transverse fracture of the proximal aspect of the base of the 5th metatarsal. Mild inferior posterior calcaneal spur formation. Minimal dorsal tarsal spur formation. IMPRESSION: Nondisplaced fracture of the proximal 5th metatarsal. Electronically Signed   By: Francisco Larson M.D.   On: 12/18/2017 10:47    Assessment & Plan:   Francisco Larson was seen today for annual exam and hyperlipidemia.  Diagnoses and all orders for this visit:  TSH elevation - He has a mild, stable elevation in his TSH and clinically appears euthyroid.  At the age of 63 I do not think thyroid replacement therapy is  indicated. -     TSH  Vitamin D deficiency- His vitamin D level is normal now. -     VITAMIN D 25 Hydroxy (Vit-D Deficiency, Fractures)  Routine general medical examination at a health care facility- Exam completed, labs reviewed, vaccines reviewed and updated, no cancer screenings are indicated, patient education was given. -     PSA   Prediabetes- His A1c is mildly elevated at 6.0%.  Medical therapy is not indicated. -     Basic metabolic panel -  Hemoglobin A1c  CLL (chronic lymphocytic leukemia) (HCC)-his white cell count is lower than it was previously.  He is not anemic and his other cell lines are normal.  This is stable. -     CBC with Differential/Platelet  Benign prostatic hyperplasia without lower urinary tract symptoms- His PSA is low which is a reassuring sign that he does not have prostate cancer.  He has no symptoms that need to be treated. -     PSA  Hyperlipidemia LDL goal <130- He has achieved his LDL goal is doing well on the statin. -     Lipid panel -     Hepatic function panel  Stage 3a chronic kidney disease- He has mild renal insufficiency.  His blood pressure is adequately well controlled.  He was encouraged to avoid nephrotoxic agents.   I have discontinued Francisco Larson "Kayde A. Rease"'s NONFORMULARY OR COMPOUNDED ITEM. I am also having him maintain his aspirin, Multiple Vitamins-Minerals (CENTRUM SILVER PO), atorvastatin, and betamethasone dipropionate.  No orders of the defined types were placed in this encounter.    Follow-up: Return in about 6 months (around 09/28/2019).  Scarlette Calico, MD

## 2019-03-28 NOTE — Patient Instructions (Signed)

## 2019-04-18 ENCOUNTER — Other Ambulatory Visit: Payer: Self-pay

## 2019-04-18 ENCOUNTER — Encounter: Payer: Self-pay | Admitting: Dermatology

## 2019-04-18 ENCOUNTER — Ambulatory Visit: Payer: Medicare PPO | Admitting: Dermatology

## 2019-04-18 DIAGNOSIS — Z85828 Personal history of other malignant neoplasm of skin: Secondary | ICD-10-CM

## 2019-04-18 DIAGNOSIS — D485 Neoplasm of uncertain behavior of skin: Secondary | ICD-10-CM | POA: Diagnosis not present

## 2019-04-18 DIAGNOSIS — D492 Neoplasm of unspecified behavior of bone, soft tissue, and skin: Secondary | ICD-10-CM

## 2019-04-18 DIAGNOSIS — L309 Dermatitis, unspecified: Secondary | ICD-10-CM | POA: Diagnosis not present

## 2019-04-18 DIAGNOSIS — C44321 Squamous cell carcinoma of skin of nose: Secondary | ICD-10-CM | POA: Diagnosis not present

## 2019-04-18 DIAGNOSIS — C44311 Basal cell carcinoma of skin of nose: Secondary | ICD-10-CM | POA: Diagnosis not present

## 2019-04-18 NOTE — Patient Instructions (Signed)

## 2019-04-20 ENCOUNTER — Encounter: Payer: Self-pay | Admitting: Podiatry

## 2019-04-20 ENCOUNTER — Ambulatory Visit: Payer: Medicare Other | Admitting: Podiatry

## 2019-04-20 ENCOUNTER — Encounter: Payer: Self-pay | Admitting: Dermatology

## 2019-04-20 ENCOUNTER — Other Ambulatory Visit: Payer: Self-pay

## 2019-04-20 VITALS — Temp 96.5°F

## 2019-04-20 DIAGNOSIS — M79675 Pain in left toe(s): Secondary | ICD-10-CM | POA: Diagnosis not present

## 2019-04-20 DIAGNOSIS — B351 Tinea unguium: Secondary | ICD-10-CM | POA: Diagnosis not present

## 2019-04-20 DIAGNOSIS — M79674 Pain in right toe(s): Secondary | ICD-10-CM | POA: Diagnosis not present

## 2019-04-20 NOTE — Patient Instructions (Signed)

## 2019-04-20 NOTE — Progress Notes (Addendum)
   Follow-Up Visit   Subjective  Francisco Larson is a 84 y.o. male who presents for the following: Follow-up (4 month follow-up to recheck right jawline. Treated on 12/08/2018.  Healed well. Did notice a new place on his nose bridge that will flake and disappear.  No itching or bleeding.).  New growth Location: Nose Duration: Few months Quality: Crusts Associated Signs/Symptoms: Modifying Factors:  Severity:  Timing: Context: History of skin cancer  The following portions of the chart were reviewed this encounter and updated as appropriate: Tobacco  Allergies  Meds  Problems  Med Hx  Surg Hx  Fam Hx      Objective  Well appearing patient in no apparent distress; mood and affect are within normal limits.  A focused examination was performed including face, hands. Relevant physical exam findings are noted in the Assessment and Plan.   Assessment & Plan  Dermatitis (3) Right 2nd Finger Metacarpophalangeal Joint; Left 5th Finger Metacarpophalangeal Joint; Right 5th Finger Metacarpophalangeal Joint  Alternate Aquaphor with his betamethasone.  Neoplasm of skin Mid Supratip of Nose  Skin / nail biopsy Type of biopsy: tangential   Informed consent: discussed and consent obtained   Anesthesia: the lesion was anesthetized in a standard fashion   Anesthetic:  1% lidocaine w/ epinephrine 1-100,000 local infiltration Instrument used: flexible razor blade   Hemostasis achieved with: ferric subsulfate   Outcome: patient tolerated procedure well   Post-procedure details: sterile dressing applied and wound care instructions given   Dressing type: bandage and petrolatum    Specimen 1 - Surgical pathology Differential Diagnosis: BCC vs SCC Check Margins: No

## 2019-04-25 NOTE — Progress Notes (Signed)
Subjective: Francisco Larson presents today for follow up of at risk foot care. Patient has h/o CKD stage 3 and painful mycotic nails b/l that are difficult to trim. Pain interferes with ambulation. Aggravating factors include wearing enclosed shoe gear. Pain is relieved with periodic professional debridement.   Allergies  Allergen Reactions  . Z-Pak [Azithromycin] Other (See Comments)    Pt stated he thinks this made him dizzy     Objective: Vitals:   04/20/19 1325  Temp: (!) 96.5 F (35.8 C)    Pt 84 y.o. year old male  in NAD. AAO x 3.   Vascular Examination:  Capillary fill time to digits <3 seconds b/l. Palpable DP pulses b/l. Palpable PT pulses b/l. Pedal hair present b/l. Skin temperature gradient within normal limits b/l.  Dermatological Examination: Pedal skin with normal turgor, texture and tone bilaterally. No open wounds bilaterally. No interdigital macerations bilaterally. Toenails L hallux, L 2nd toe, R hallux and R 2nd toe elongated, dystrophic, thickened, and crumbly with subungual debris and tenderness to dorsal palpation.  Musculoskeletal: Normal muscle strength 5/5 to all lower extremity muscle groups bilaterally, no gross bony deformities bilaterally and no pain crepitus or joint limitation noted with ROM b/l  Neurological: Protective sensation intact 5/5 intact bilaterally with 10g monofilament b/l Vibratory sensation intact b/l  Assessment: 1. Pain due to onychomycosis of toenails of both feet    Plan: -Toenails L hallux, L 2nd toe, R hallux and R 2nd toe debrided in length and girth without iatrogenic bleeding with sterile nail nipper and dremel.  -Patient to continue soft, supportive shoe gear daily. -Patient to report any pedal injuries to medical professional immediately. -Patient/POA to call should there be question/concern in the interim.  Return in about 6 months (around 10/20/2019).

## 2019-04-26 DIAGNOSIS — C44311 Basal cell carcinoma of skin of nose: Secondary | ICD-10-CM

## 2019-04-26 NOTE — Telephone Encounter (Signed)
This encounter was created in error - please disregard.

## 2019-05-04 ENCOUNTER — Telehealth: Payer: Self-pay

## 2019-05-04 NOTE — Telephone Encounter (Signed)
Email sent to Dr. [place] Park referral coordinator Hershal Coria @ [email or web address] for patient to be schedule for [facility].

## 2019-06-09 NOTE — Addendum Note (Signed)
Addended by: [name] on: 06/09/2019 02:59 PM   Modules accepted: Orders

## 2019-06-09 NOTE — Addendum Note (Signed)
Addended by: [name] on: 06/09/2019 02:56 PM   Modules accepted: Orders

## 2019-06-27 IMAGING — DX DG FOOT COMPLETE 3+V*R*
3 series · 3 of 3 positions shown · non-contrast
Comparison: None.

CLINICAL DATA: Lateral right foot pain and bruising following a
twisting injury yesterday.

EXAM:
RIGHT FOOT COMPLETE - 3+ VIEW

[foot ap]
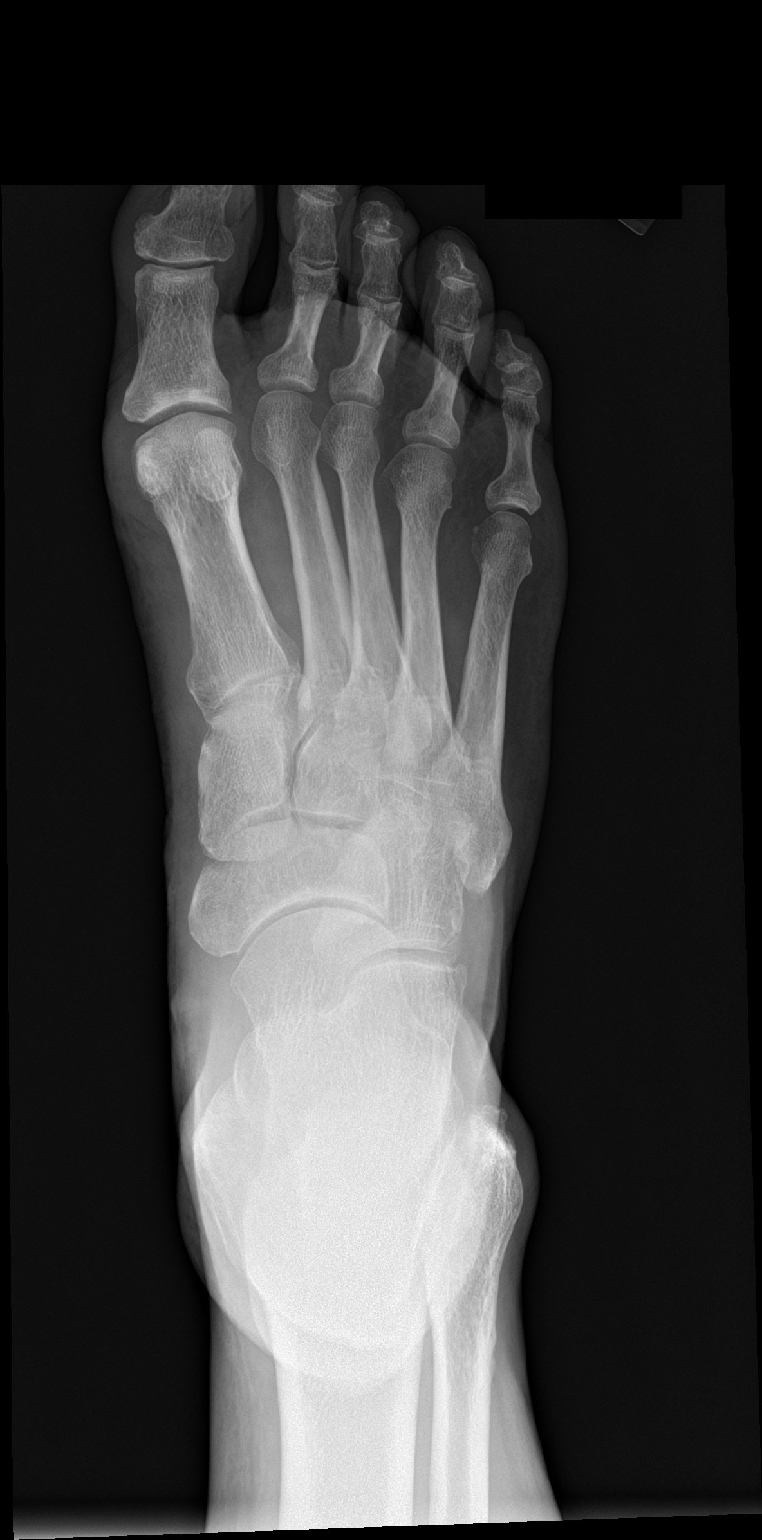

[foot obl]
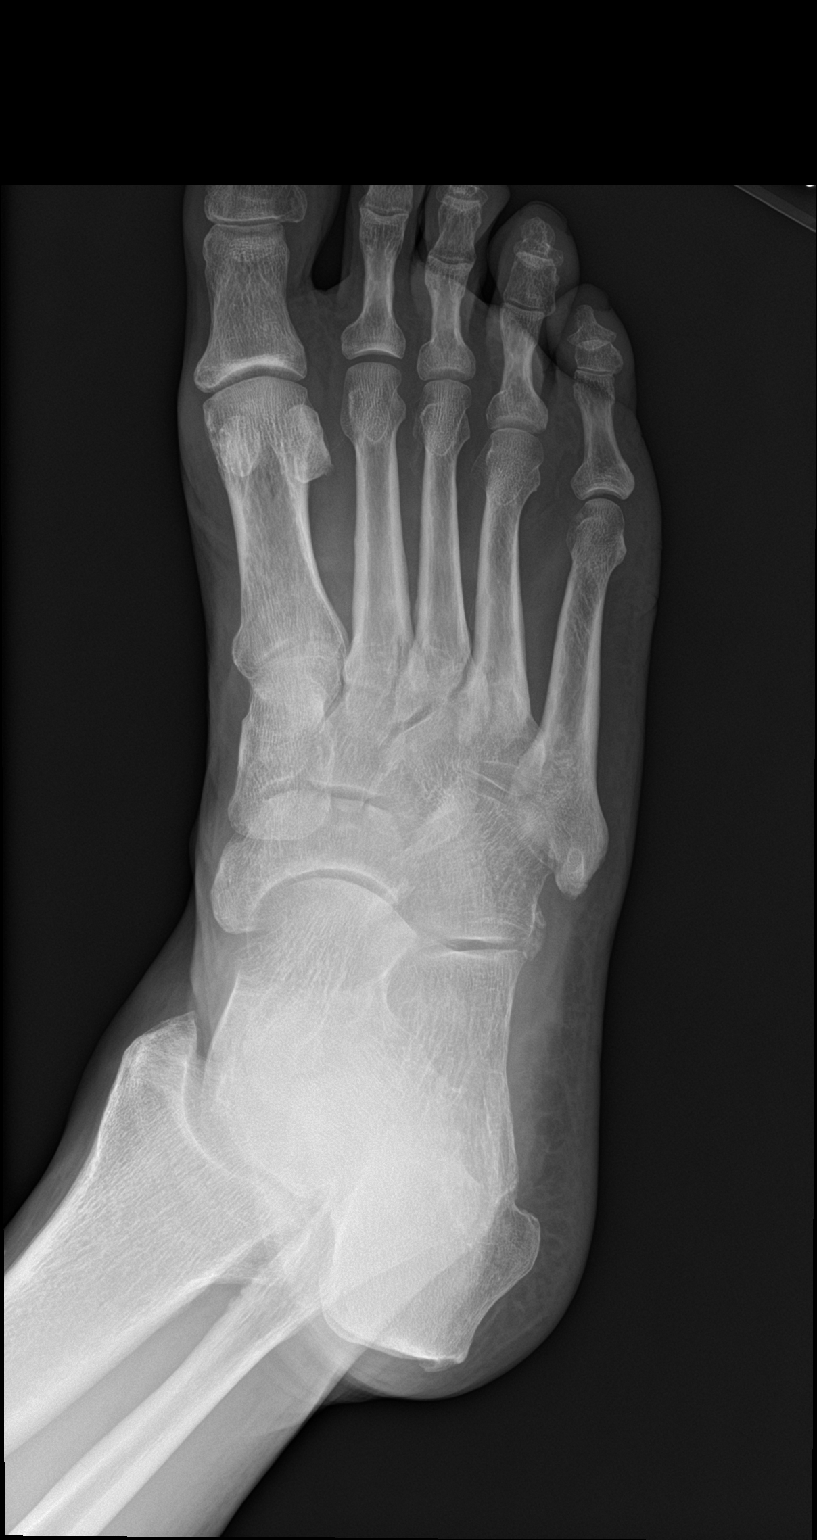

[foot lat]
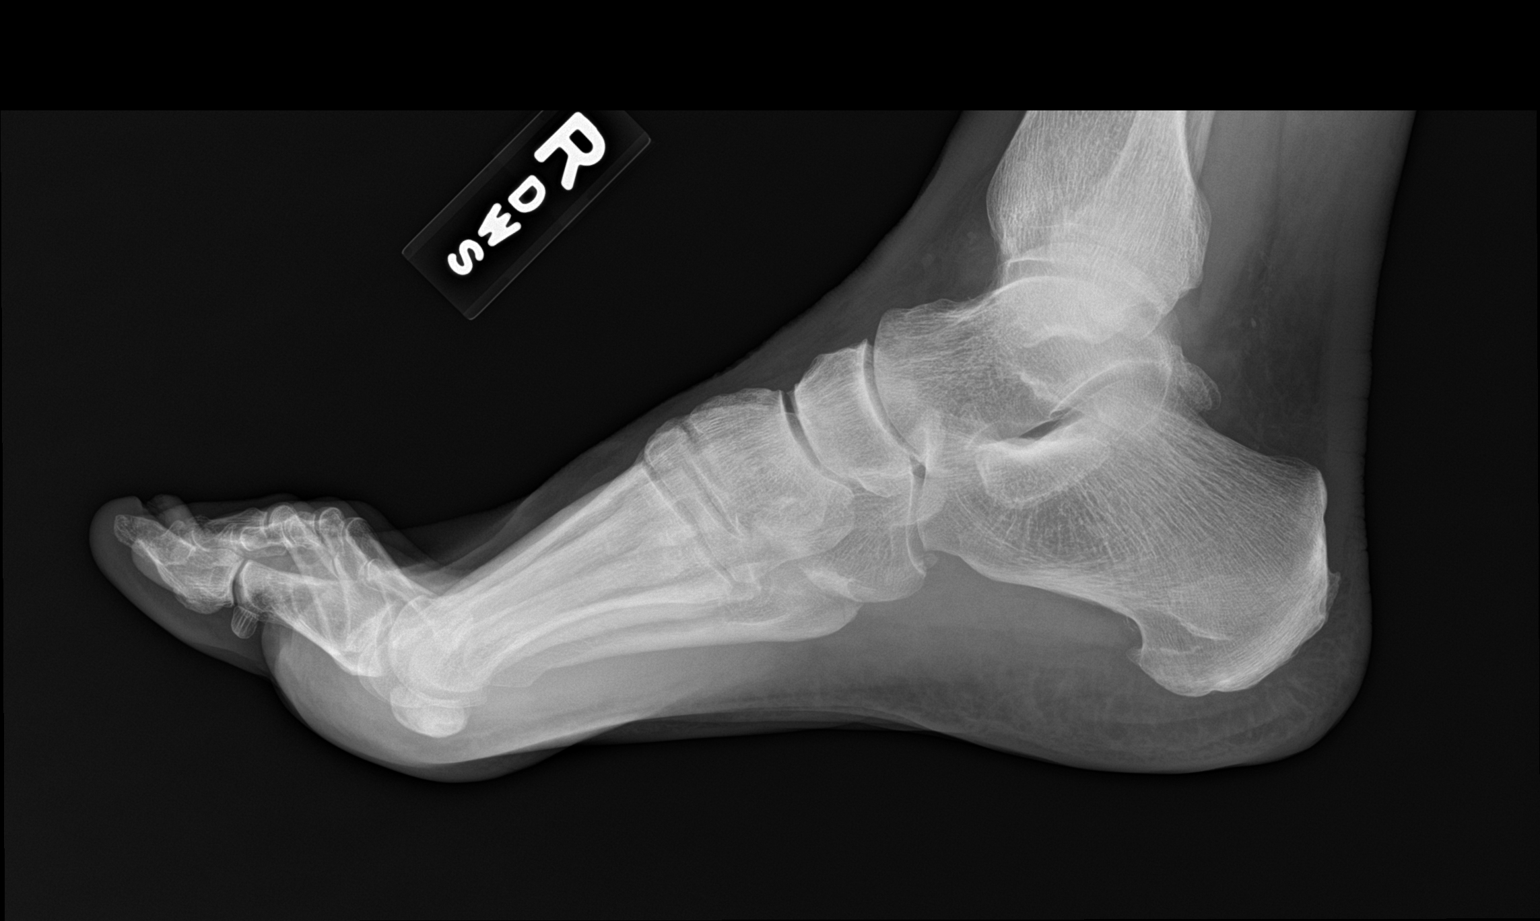

[3 of 3 positions shown; findings below may reference images not displayed]

FINDINGS: Nondisplaced transverse fracture of the proximal aspect of the base
of the 5th metatarsal. Mild inferior posterior calcaneal spur
formation. Minimal dorsal tarsal spur formation.
IMPRESSION: Nondisplaced fracture of the proximal 5th metatarsal.

## 2019-08-26 DIAGNOSIS — T63461A Toxic effect of venom of wasps, accidental (unintentional), initial encounter: Secondary | ICD-10-CM | POA: Diagnosis not present

## 2019-09-05 ENCOUNTER — Ambulatory Visit: Payer: Self-pay | Attending: Internal Medicine

## 2019-09-05 DIAGNOSIS — Z23 Encounter for immunization: Secondary | ICD-10-CM

## 2019-09-05 NOTE — Progress Notes (Signed)
   Covid-19 Vaccination Clinic  Name:  Hershey Knauer    MRN: [id number] DOB: 03-31-1935  09/05/2019  Mr. Forti was observed post Covid-19 immunization for 15 minutes without incident. He was provided with Vaccine Information Sheet and instruction to access the V-Safe system.   Mr. Hallum was instructed to call 911 with any severe reactions post vaccine: Marland Kitchen Difficulty breathing  . Swelling of face and throat  . A fast heartbeat  . A bad rash all over body  . Dizziness and weakness

## 2019-10-17 ENCOUNTER — Encounter: Payer: Self-pay | Admitting: Dermatology

## 2019-10-17 ENCOUNTER — Other Ambulatory Visit: Payer: Self-pay

## 2019-10-17 ENCOUNTER — Ambulatory Visit (INDEPENDENT_AMBULATORY_CARE_PROVIDER_SITE_OTHER): Payer: Medicare PPO | Admitting: Dermatology

## 2019-10-17 DIAGNOSIS — L738 Other specified follicular disorders: Secondary | ICD-10-CM | POA: Diagnosis not present

## 2019-10-17 DIAGNOSIS — L821 Other seborrheic keratosis: Secondary | ICD-10-CM | POA: Diagnosis not present

## 2019-10-17 DIAGNOSIS — L570 Actinic keratosis: Secondary | ICD-10-CM

## 2019-10-17 DIAGNOSIS — Z1283 Encounter for screening for malignant neoplasm of skin: Secondary | ICD-10-CM

## 2019-10-17 NOTE — Patient Instructions (Addendum)
Routine follow-up for Francisco Larson date of birth 1935/03/13 (although he looks 44 years younger than this).  We did a follow-up on the minor surgery on the superficial skin cancer on his lower nose which is completely clear with no scar.  Likewise a place on the right angle of the jaw is smooth as well.  On the neck and torso plus lower right sideburn are tan textured benign keratoses which require no intervention.  A relatively new white bubble on the right mid cheek represents either sebaceous hyperplasia or a small epidermoid cyst and for now he is not interested in removing this unless there is obvious clinical change.  Finally he has moderate precancer above the left outer brow which was treated with 6-second liquid nitrogen freeze; this may swell and peel in the next 2 weeks but there is no special care.  If all goes well routine complete skin check annually, but he knows he can call me if there are any issues.

## 2019-10-24 ENCOUNTER — Other Ambulatory Visit: Payer: Self-pay | Admitting: Cardiovascular Disease

## 2019-10-26 ENCOUNTER — Other Ambulatory Visit: Payer: Self-pay

## 2019-10-26 ENCOUNTER — Ambulatory Visit: Payer: Medicare PPO | Admitting: Podiatry

## 2019-10-26 ENCOUNTER — Encounter: Payer: Self-pay | Admitting: Podiatry

## 2019-10-26 DIAGNOSIS — M79675 Pain in left toe(s): Secondary | ICD-10-CM

## 2019-10-26 DIAGNOSIS — M79674 Pain in right toe(s): Secondary | ICD-10-CM

## 2019-10-26 DIAGNOSIS — B351 Tinea unguium: Secondary | ICD-10-CM | POA: Diagnosis not present

## 2019-10-30 NOTE — Progress Notes (Signed)
Subjective: Francisco Larson presents today for follow up of at risk foot care. Patient has h/o CKD stage 3 and painful mycotic nails b/l that are difficult to trim. Pain interferes with ambulation. Aggravating factors include wearing enclosed shoe gear. Pain is relieved with periodic professional debridement.   He states he may have gone a little too long without having his toenails trimmed.  Allergies  Allergen Reactions  . Azithromycin Other (See Comments) and Itching    Pt stated he thinks this made him dizzy Pt stated he thinks this made him dizzy    Objective: There were no vitals filed for this visit.  Pt is a pleasant 84 y.o. year old male  in NAD. AAO x 3.   Vascular Examination:  Capillary fill time to digits <3 seconds b/l. Palpable DP pulses b/l. Palpable PT pulses b/l. Pedal hair present b/l. Skin temperature gradient within normal limits b/l.  Dermatological Examination: Pedal skin with normal turgor, texture and tone bilaterally. No open wounds bilaterally. No interdigital macerations bilaterally. Toenails L hallux, L 2nd toe, R hallux and R 2nd toe elongated, dystrophic, thickened, and crumbly with subungual debris and tenderness to dorsal palpation.  Musculoskeletal: Normal muscle strength 5/5 to all lower extremity muscle groups bilaterally, no gross bony deformities bilaterally and no pain crepitus or joint limitation noted with ROM b/l  Neurological: Protective sensation intact 5/5 intact bilaterally with 10g monofilament b/l Vibratory sensation intact b/l  Assessment: 1. Pain due to onychomycosis of toenails of both feet    Plan: -Toenails L hallux, L 2nd toe, R hallux and R 2nd toe debrided in length and girth without iatrogenic bleeding with sterile nail nipper and dremel.  -Patient to continue soft, supportive shoe gear daily. -Patient to report any pedal injuries to medical professional immediately. -Patient/POA to call should there be question/concern in  the interim.  Return in about 4 months (around 02/26/2020).

## 2019-10-31 ENCOUNTER — Encounter: Payer: Self-pay | Admitting: Oncology

## 2019-11-14 ENCOUNTER — Other Ambulatory Visit: Payer: Self-pay

## 2019-11-14 ENCOUNTER — Inpatient Hospital Stay: Payer: Medicare PPO

## 2019-11-14 ENCOUNTER — Inpatient Hospital Stay: Payer: Medicare PPO | Attending: Oncology

## 2019-11-14 DIAGNOSIS — C9110 Chronic lymphocytic leukemia of B-cell type not having achieved remission: Secondary | ICD-10-CM | POA: Insufficient documentation

## 2019-11-14 DIAGNOSIS — Z23 Encounter for immunization: Secondary | ICD-10-CM | POA: Diagnosis not present

## 2019-11-14 LAB — CMP (CANCER CENTER ONLY)
ALT: 21 U/L (ref 0–44)
AST: 23 U/L (ref 15–41)
Albumin: 3.1 g/dL — ABNORMAL LOW (ref 3.5–5.0)
Alkaline Phosphatase: 79 U/L (ref 38–126)
Anion gap: 4 — ABNORMAL LOW (ref 5–15)
BUN: 18 mg/dL (ref 8–23)
CO2: 28 mmol/L (ref 22–32)
Calcium: 9.1 mg/dL (ref 8.9–10.3)
Chloride: 107 mmol/L (ref 98–111)
Creatinine: 1.11 mg/dL (ref 0.61–1.24)
GFR, Estimated: >60 mL/min (ref >60)
Glucose, Bld: 100 mg/dL — ABNORMAL HIGH (ref 70–99)
Potassium: 4.4 mmol/L (ref 3.5–5.1)
Sodium: 139 mmol/L (ref 135–145)
Total Bilirubin: 0.7 mg/dL (ref 0.3–1.2)
Total Protein: 5.8 g/dL — ABNORMAL LOW (ref 6.5–8.1)

## 2019-11-14 LAB — CBC WITH DIFFERENTIAL (CANCER CENTER ONLY)
Abs Immature Granulocytes: 0.02 10*3/uL (ref 0.00–0.07)
Basophils Absolute: 0.1 10*3/uL (ref 0.0–0.1)
Basophils Relative: 0 %
Eosinophils Absolute: 0.2 10*3/uL (ref 0.0–0.5)
Eosinophils Relative: 1 %
HCT: 47.9 % (ref 39.0–52.0)
Hemoglobin: 15.9 g/dL (ref 13.0–17.0)
Immature Granulocytes: 0 %
Lymphocytes Relative: 81 %
Lymphs Abs: 20.7 10*3/uL — ABNORMAL HIGH (ref 0.7–4.0)
MCH: 29.8 pg (ref 26.0–34.0)
MCHC: 33.2 g/dL (ref 30.0–36.0)
MCV: 89.7 fL (ref 80.0–100.0)
Monocytes Absolute: 0.9 10*3/uL (ref 0.1–1.0)
Monocytes Relative: 4 %
Neutro Abs: 3.4 10*3/uL (ref 1.7–7.7)
Neutrophils Relative %: 14 %
Platelet Count: 158 10*3/uL (ref 150–400)
RBC: 5.34 MIL/uL (ref 4.22–5.81)
RDW: 13.2 % (ref 11.5–15.5)
WBC Count: 25.3 10*3/uL — ABNORMAL HIGH (ref 4.0–10.5)
nRBC: 0 % (ref 0.0–0.2)

## 2019-11-14 LAB — LACTATE DEHYDROGENASE: LDH: 152 U/L (ref 98–192)

## 2019-11-14 MED ORDER — INFLUENZA VAC A&B SA ADJ QUAD 0.5 ML IM PRSY
0.5000 mL | PREFILLED_SYRINGE | Freq: Once | INTRAMUSCULAR | Status: AC
  Administered 2019-11-14: 0.5 mL via INTRAMUSCULAR

## 2019-11-14 MED ORDER — INFLUENZA VAC A&B SA ADJ QUAD 0.5 ML IM PRSY
PREFILLED_SYRINGE | INTRAMUSCULAR | Status: AC
  Filled 2019-11-14: qty 0.5

## 2019-11-15 LAB — BETA 2 MICROGLOBULIN, SERUM: Beta-2 Microglobulin: 3.1 mg/L — ABNORMAL HIGH (ref 0.6–2.4)

## 2019-11-19 ENCOUNTER — Encounter: Payer: Self-pay | Admitting: Dermatology

## 2019-11-19 NOTE — Progress Notes (Signed)
   Follow-Up Visit   Subjective  Francisco Larson is a 84 y.o. male who presents for the following: Follow-up (6 month follow up-nose and right sideburn).  Crust Location: Open left Duration:  Quality:  Associated Signs/Symptoms: Modifying Factors:  Severity:  Timing: Context:   Objective  Well appearing patient in no apparent distress; mood and affect are within normal limits.  All skin waist up examined.   Assessment & Plan   Routine follow-up for Francisco Larson date of birth [birth year] (although he looks 72 years younger than this).  We did a follow-up on the minor surgery on the superficial skin cancer on his lower nose which is completely clear with no scar.  Likewise a place on the right angle of the jaw is smooth as well.  On the neck and torso plus lower right sideburn are tan textured benign keratoses which require no intervention.  A relatively new white bubble on the right mid cheek represents either sebaceous hyperplasia or a small epidermoid cyst and for now he is not interested in removing this unless there is obvious clinical change.  Finally he has moderate precancer above the left outer brow which was treated with 6-second liquid nitrogen freeze; this may swell and peel in the next 2 weeks but there is no special care.  If all goes well routine complete skin check annually, but he knows he can call me if there are any issues.     I, [name], MD, have reviewed all documentation for this visit.  The documentation on 11/19/19 for the exam, diagnosis, procedures, and orders are all accurate and complete.

## 2019-11-21 NOTE — Progress Notes (Signed)
Patient ID: Francisco Larson, male   DOB: Sep 19, 1935, 84 y.o.   MRN: [id number]     Cardiology Office Note   Date:  11/23/2019   ID:  Francisco Larson, DOB [birth year], MRN [id number]  PCP:  Janith Lima, MD  Cardiologist:   Jenkins Rouge, MD   No chief complaint on file.     History of Present Illness: 84 y.o. history of PAC;s , CAD by calcium scoring 285 in 2016 HLD and CLL.  WBC typically in 25 range but no Rx needed since 2003.  Strong family history of premature CAD. Very active goes to [employer]. Some arthritis in left knee limits activity Has had chronic ACL tear And sees Alusio   Golfing once/week with large group He taught sport and society at [employer] and knew [name] who I took a course with    Calcium Score:  03/13/14 IMPRESSION: Coronary calcium score of 285. This was 64 st percentile for age and sex matched control.  Duplex 08/25/17 plaque no stenosis   Son lives in [place] does rafting tours Recently remarried  Daughter lives in [place] just has twin boys and a new baby boy   No cardiac complaints  Still active walking 3 miles/day with good endurance and no chest pain   Past Medical History:  Diagnosis Date  . Arthritis   . Hyperlipidemia   . Leukemia, chronic lymphocytic ([place])   . Shingles   . Vitamin D deficiency     Past Surgical History:  Procedure Laterality Date  . EYE SURGERY    . LAPAROSCOPIC TRANS ANAL AND TRANSABDOMINAL RECTAL RESECTION WITH COLOANAL ANASTOMOSIS    . SURGERY OF LIP     tumor  . TONSILLECTOMY    . VASECTOMY       Current Outpatient Medications  Medication Sig Dispense Refill  . aspirin 81 MG tablet Take 81 mg by mouth daily.    Marland Kitchen atorvastatin (LIPITOR) 20 MG tablet Take 1 tablet (20 mg total) by mouth daily. Must keep upcoming appt for future refills 90 tablet 1  . betamethasone dipropionate 0.05 % cream APR OF HANDS    . Multiple Vitamins-Minerals (CENTRUM SILVER PO) Take 1  tablet by mouth daily.      No current facility-administered medications for this visit.    Allergies:   Azithromycin    Social History:  The patient  reports that he has never smoked. He has never used smokeless tobacco. He reports that he does not drink alcohol and does not use drugs.   Family History:  The patient's family history includes Asthma in his sister; COPD in his maternal grandmother; Cancer in his mother; Colon cancer in his sister; Diabetes in his paternal grandmother; Emphysema in his mother; Heart disease in his father; Hyperlipidemia in his sister; Mental illness in his paternal grandmother; Stroke in his maternal grandfather and sister.    ROS:  Please see the history of present illness.   Otherwise, review of systems are positive for none.   All other systems are reviewed and negative.    PHYSICAL EXAM: VS:  BP 136/78   Pulse 68   Ht 5\' 11"  (1.803 m)   Wt 97.8 kg   BMI 30.07 kg/m  , BMI Body mass index is 30.07 kg/m. Affect appropriate Healthy:  appears stated age 84: normal Neck supple with no adenopathy JVP normal left  bruits no thyromegaly Lungs clear with no wheezing and good diaphragmatic  motion Heart:  S1/S2 no murmur, no rub, gallop or click PMI normal Abdomen: benighn, BS positve, no tenderness, no AAA no bruit.  No HSM or HJR Distal pulses intact with no bruits No edema Neuro non-focal Skin warm and dry No muscular weakness     EKG:   08/20/17 SR rate 66 normal 10/13/18 SR rate 79 PAC otherwise normal    Recent Labs: 03/28/2019: TSH 5.91 11/14/2019: ALT 21; BUN 18; Creatinine 1.11; Hemoglobin 15.9; Platelet Count 158; Potassium 4.4; Sodium 139    Lipid Panel    Component Value Date/Time   CHOL 120 03/28/2019 0858   CHOL 136 02/17/2017 0832   TRIG 89.0 03/28/2019 0858   HDL 26.80 (L) 03/28/2019 0858   HDL 29 (L) 02/17/2017 0832   CHOLHDL 4 03/28/2019 0858   VLDL 17.8 03/28/2019 0858   LDLCALC 75 03/28/2019 0858   LDLCALC 82  02/17/2017 0832      Wt Readings from Last 3 Encounters:  11/23/19 97.8 kg  03/28/19 96.8 kg  11/22/18 96.1 kg      Other studies Reviewed: Additional studies/ records that were reviewed today include: records Dr Everlene Farrier and [facility] office notes .    ASSESSMENT AND PLAN:  1. Family History CAD: Active and asymptomatic  Baseline ECG is normal . 2016 Calcium score 285 which is 61st percentile  Stable no agnina observe   2. Chol:  On statin LFTls ok  Lab Results  Component Value Date   LDLCALC 75 03/28/2019     3. CLL  WBC 25.3   Been like that for 6 years.  F/u oncology 4. Left bruit:   08/25/17 Duplex without significant stenosis ASA / Statin    5. Atrial Flutter:  New diagnosis. Asymptomatic Discussed options of observation with anticoagulation, AAT, or ablation He is in great shape for his age. Will get echo and 14 day event monitor to r/o new structural heart changes and see if rhythm is paroxysmal. Refer to EP Stop ASA   Current medicines are reviewed at length with the patient today.  The patient does not have concerns regarding medicines.  The following changes have been made:   Eliquis 5 bid   Labs/ tests ordered today include:  Echo, Event monitor    No orders of the defined types were placed in this encounter.    Disposition:   FU with  EP post testing and me in 3 months     Signed, Jenkins Rouge, MD  11/23/2019 9:02 AM    [facility] [place], [place], [place]  [address] Phone: [phone]; Fax: [phone]

## 2019-11-22 NOTE — Progress Notes (Signed)
[place]  [phone] [phone]    ID: Francisco Larson   DOB: Sep 21, 1935  MR#: [id number]  WIO#:[id number]  Patient Care Team: Janith Lima, MD as PCP - General (Internal Medicine) Josue Hector, MD as Consulting Physician (Cardiology) Mahrukh Seguin, Virgie Dad, MD as Consulting Physician (Oncology) Alphonsa Overall, MD as Consulting Physician (General Surgery) Belva Crome, MD as Consulting Physician (Cardiology) [name], MD as Consulting Physician (Dermatology) [name], MD as Consulting Physician (Dermatology) OTHER MD: Nena Jordan MD   INTERVAL HISTORY: Francisco Larson returns today for follow-up of his chronic lymphoid leukemia.  We are following his total white cell count and his lymphocyte count  Lab work up to the last year: Lab Results  Component Value Date   WBC 25.3 (H) 11/14/2019   WBC 25.1 Repeated and verified X2. (HH) 03/28/2019   WBC 29.2 (H) 11/15/2018   WBC 33.7 (HH) 03/22/2018   WBC 29.2 (H) 11/17/2017   Lymphs Abs 0.7 - 4.0 K/uL 20.7High  20.8High  24.3High     REVIEW OF SYSTEMS: Francisco Larson tells me he has friends of his (and hours as it turns out) who are fully vaccinated and developed a breakthrough infection. That convinced him that he should stop going to the gym and he tells me that is why he is gaining weight. He is certainly has had no fevers rash pruritus or unexplained fatigue. There have been no drenching sweats and no adenopathy. He has been found he says as of today to have atrial flutter and has been prescribed Eliquis. He has not yet started it. A detailed review of systems today was otherwise noncontributory    CO VID 19 VACC NATION STATUS: Status post Pfizer x2  plus booster 09/05/2019    PAST MEDICAL HISTORY: Past Medical History:  Diagnosis Date  . Arthritis   . Hyperlipidemia   . Leukemia, chronic lymphocytic ([place])   . Shingles   . Vitamin D deficiency     PAST SURGICAL HISTORY: Past  Surgical History:  Procedure Laterality Date  . EYE SURGERY    . LAPAROSCOPIC TRANS ANAL AND TRANSABDOMINAL RECTAL RESECTION WITH COLOANAL ANASTOMOSIS    . SURGERY OF LIP     tumor  . TONSILLECTOMY    . VASECTOMY      FAMILY HISTORY Family History  Problem Relation Age of Onset  . Colon cancer Sister   . Hyperlipidemia Sister   . Stroke Sister   . Heart disease Father   . Emphysema Mother        never smoker, but exposed to 2nd hand from spouse  . Cancer Mother   . COPD Maternal Grandmother        never smoker  . Stroke Maternal Grandfather   . Diabetes Paternal Grandmother   . Mental illness Paternal Grandmother   . Asthma Sister    The patient's mother died at the age of 19. The patient's father died at the age of 20 from heart disease. He had no brothers. His only sister, currently 29 years old, has a history of late breast cancer and late middle-aged colon cancer.   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 He has been married to [place] 50+ years, and their children are [place] lives in [place] and has 4 children, and Garrett who lives in [place] and has no children. He recently became a great-grandfather to two twin girls,  who were born in August 2019. Francisco Larson is an elder at [employer].    ADVANCED DIRECTIVES: in place   HEALTH MAINTENANCE: Social History   Tobacco Use  . Smoking status: Never Smoker  . Smokeless tobacco: Never Used  Vaping Use  . Vaping Use: Never used  Substance Use Topics  . Alcohol use: No  . Drug use: No     Colonoscopy:10/18/2014  PSA: 3.13 03/17/2016  Lipid panel:  Allergies  Allergen Reactions  . Azithromycin Other (See Comments) and Itching    Pt stated he thinks this made him dizzy Pt stated he thinks this made him dizzy    Current Outpatient Medications  Medication Sig Dispense Refill  . apixaban (ELIQUIS) 5  MG TABS tablet Take 1 tablet (5 mg total) by mouth 2 (two) times daily. 60 tablet 11  . atorvastatin (LIPITOR) 20 MG tablet Take 1 tablet (20 mg total) by mouth daily. Must keep upcoming appt for future refills 90 tablet 1  . betamethasone dipropionate 0.05 % cream APR OF HANDS    . Multiple Vitamins-Minerals (CENTRUM SILVER PO) Take 1 tablet by mouth daily.      No current facility-administered medications for this visit.    OBJECTIVE: White man in no acute distress  Vitals:   11/23/19 1121  BP: 129/73  Pulse: 69  Resp: 18  Temp: (!) 97.3 F (36.3 C)  SpO2: 97%     Body mass index is 29.82 kg/m.    ECOG FS: 0   Sclerae unicteric, EOMs intact Wearing a mask No cervical or supraclavicular adenopathy, no axillary adenopathy Lungs no rales or rhonchi Heart regular rate and rhythm on exam today Abd soft, nontender, positive bowel sounds MSK no focal spinal tenderness, no upper extremity lymphedema Neuro: nonfocal, well oriented, appropriate affect   LABS:  Lab Results  Component Value Date   WBC 25.3 (H) 11/14/2019   NEUTROABS 3.4 11/14/2019   HGB 15.9 11/14/2019   HCT 47.9 11/14/2019   MCV 89.7 11/14/2019   PLT 158 11/14/2019      Chemistry      Component Value Date/Time   NA 139 11/14/2019 1117   NA 137 09/10/2016 1024   K 4.4 11/14/2019 1117   K 4.6 09/10/2016 1024   CL 107 11/14/2019 1117   CL 108 (H) 05/26/2012 0804   CO2 28 11/14/2019 1117   CO2 26 09/10/2016 1024   BUN 18 11/14/2019 1117   BUN 17.3 09/10/2016 1024   CREATININE 1.11 11/14/2019 1117   CREATININE 1.1 09/10/2016 1024      Component Value Date/Time   CALCIUM 9.1 11/14/2019 1117   CALCIUM 8.9 09/10/2016 1024   ALKPHOS 79 11/14/2019 1117   ALKPHOS 80 09/10/2016 1024   AST 23 11/14/2019 1117   AST 25 09/10/2016 1024   ALT 21 11/14/2019 1117   ALT 23 09/10/2016 1024   BILITOT 0.7 11/14/2019 1117   BILITOT 0.62 09/10/2016 1024      No results found for: LABCA2  No components found  for: [id number]  No results for input(s): INR in the last 168 hours.  Urinalysis    Component Value Date/Time   COLORURINE YELLOW 03/17/2016 0936    STUDIES: No results found.    ASSESSMENT: 84 y.o. 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    PLAN:  Francisco Larson is now 24 years out  from initial diagnosis of chronic lymphoid leukemia with no evidence of disease progression. He does not require treatment at this time, only observation  He is fully vaccinated but unfortunately he is unlikely to make good antibodies since his entire B cell system is nonfunctional partly because of his cancer and partly because of its treatment. T cells should be fairly intact or at least moderately intact so he does have some immunity. Nevertheless my advice to him is to consider himself not vaccinated and take the appropriate precautions with that in mind  We will continue to check lab work every 6 months with a visit in 1 year. He knows to call for any other issue that may develop before the next visit  Total encounter time 20 minutes.*    Kailia Starry, Virgie Dad, MD  11/23/19 3:13 PM Medical Oncology and Hematology [facility] [place], [place] [address] Tel. [phone]    Fax. [phone]   I, Wilburn Mylar, am acting as scribe for Dr. Virgie Dad. Lyonel Morejon.  I, Lurline Del MD, have reviewed the above documentation for accuracy and completeness, and I agree with the above.    *Total Encounter Time as defined by the Centers for Medicare and Medicaid Services includes, in addition to the face-to-face time of a patient visit (documented in the note above) non-face-to-face time: obtaining and reviewing outside history, ordering and reviewing medications, tests or procedures, care coordination  (communications with other health care professionals or caregivers) and documentation in the medical record.

## 2019-11-23 ENCOUNTER — Inpatient Hospital Stay: Payer: Medicare PPO | Admitting: Oncology

## 2019-11-23 ENCOUNTER — Encounter: Payer: Self-pay | Admitting: *Deleted

## 2019-11-23 ENCOUNTER — Encounter: Payer: Self-pay | Admitting: Cardiovascular Disease

## 2019-11-23 ENCOUNTER — Other Ambulatory Visit: Payer: Self-pay

## 2019-11-23 ENCOUNTER — Ambulatory Visit: Payer: Medicare PPO | Admitting: Cardiovascular Disease

## 2019-11-23 VITALS — BP 136/78 | HR 68 | Ht 71.0 in | Wt 215.6 lb

## 2019-11-23 VITALS — BP 129/73 | HR 69 | Temp 97.3°F | Resp 18 | Ht 71.0 in | Wt 213.8 lb

## 2019-11-23 DIAGNOSIS — Z23 Encounter for immunization: Secondary | ICD-10-CM | POA: Diagnosis not present

## 2019-11-23 DIAGNOSIS — C9110 Chronic lymphocytic leukemia of B-cell type not having achieved remission: Secondary | ICD-10-CM

## 2019-11-23 DIAGNOSIS — I4892 Unspecified atrial flutter: Secondary | ICD-10-CM

## 2019-11-23 MED ORDER — APIXABAN 5 MG PO TABS: 5.0000 mg | ORAL_TABLET | Freq: Two times a day (BID) | ORAL | 11 refills | Status: DC

## 2019-11-23 NOTE — Progress Notes (Signed)
Patient ID: Francisco Larson, male   DOB: 1935/07/24, 84 y.o.   MRN: [id number] Patient enrolled for [employer] to ship a 14 day ZIO XT long term holter monitor to his home.

## 2019-11-23 NOTE — Patient Instructions (Addendum)
Medication Instructions:  Your physician has recommended you make the following change in your medication:  1-STOP Aspirin 2-START Eliquis 5 mg by mouth twice daily.  *If you need a refill on your cardiac medications before your next appointment, please call your pharmacy*  Lab Work: If you have labs (blood work) drawn today and your tests are completely normal, you will receive your results only by: Marland Kitchen MyChart Message (if you have MyChart) OR . A paper copy in the mail If you have any lab test that is abnormal or we need to change your treatment, we will call you to review the results.  Testing/Procedures: Your physician has requested that you have an echocardiogram. Echocardiography is a painless test that uses sound waves to create images of your heart. It provides your doctor with information about the size and shape of your heart and how well your heart's chambers and valves are working. This procedure takes approximately one hour. There are no restrictions for this procedure.  ZIO XT- Long Term Monitor Instructions   Your physician has requested you wear your ZIO patch monitor_______days.   This is a single patch monitor.  [employer] supplies one patch monitor per enrollment.  Additional stickers are not available.   Please do not apply patch if you will be having a Nuclear Stress Test, Echocardiogram, Cardiac CT, MRI, or Chest Xray during the time frame you would be wearing the monitor. The patch cannot be worn during these tests.  You cannot remove and re-apply the ZIO XT patch monitor.   Your ZIO patch monitor will be sent USPS Priority mail from [facility] directly to your home address. The monitor may also be mailed to a PO BOX if home delivery is not available.   It may take 3-5 days to receive your monitor after you have been enrolled.   Once you have received you monitor, please review enclosed instructions.  Your monitor has already been registered assigning a specific  monitor serial # to you.   Applying the monitor   Shave hair from upper left chest.   Hold abrader disc by orange tab.  Rub abrader in 40 strokes over left upper chest as indicated in your monitor instructions.   Clean area with 4 enclosed alcohol pads .  Use all pads to assure are is cleaned thoroughly.  Let dry.   Apply patch as indicated in monitor instructions.  Patch will be place under collarbone on left side of chest with arrow pointing upward.   Rub patch adhesive wings for 2 minutes.Remove white label marked "1".  Remove white label marked "2".  Rub patch adhesive wings for 2 additional minutes.   While looking in a mirror, press and release button in center of patch.  A small green light will flash 3-4 times .  This will be your only indicator the monitor has been turned on.     Do not shower for the first 24 hours.  You may shower after the first 24 hours.   Press button if you feel a symptom. You will hear a small click.  Record Date, Time and Symptom in the Patient Log Book.   When you are ready to remove patch, follow instructions on last 2 pages of Patient Log Book.  Stick patch monitor onto last page of Patient Log Book.   Place Patient Log Book in [place] box.  Use locking tab on box and tape box closed securely.  The [place] and [employer] has [employer]  on it.  Please place in mailbox as soon as possible.  Your physician should have your test results approximately 7 days after the monitor has been mailed back to [facility]   Call [place] at [phone] if you have questions regarding your ZIO XT patch monitor.  Call them immediately if you see an orange light blinking on your monitor.   If your monitor falls off in less than 4 days contact our Monitor department at [phone].  If your monitor becomes loose or falls off after 4 days call [employer] at [phone] for suggestions on securing your monitor.   Follow-Up: At [facility], you  and your health needs are our priority.  As part of our continuing mission to provide you with exceptional heart care, we have created designated Provider Care Teams.  These Care Teams include your primary Cardiologist (physician) and Advanced Practice Providers (APPs -  Physician Assistants and Nurse Practitioners) who all work together to provide you with the care you need, when you need it.  We recommend signing up for the patient portal called "MyChart".  Sign up information is provided on this After Visit Summary.  MyChart is used to connect with patients for Virtual Visits (Telemedicine).  Patients are able to view lab/test results, encounter notes, upcoming appointments, etc.  Non-urgent messages can be sent to your provider as well.   To learn more about what you can do with MyChart, go to [email or web address].    Your next appointment:   3 month(s) after seeing EP  The format for your next appointment:   In Person  Provider:   You may see Dr. Johnsie Cancel or one of the following Advanced Practice Providers on your designated Care Team:    Truitt Merle, NP  Cecilie Kicks, NP  Kathyrn Drown, NP    Other Instructions You have been referred to Cardiac Electrophysiologist for Atrial Flutter.

## 2019-11-24 ENCOUNTER — Telehealth: Payer: Self-pay | Admitting: Oncology

## 2019-11-24 NOTE — Telephone Encounter (Signed)
Scheduled appt per 11/10 LOS and 11/10 sch msg - pt is aware of appt date and time

## 2019-11-25 ENCOUNTER — Ambulatory Visit (INDEPENDENT_AMBULATORY_CARE_PROVIDER_SITE_OTHER): Payer: Medicare PPO

## 2019-11-25 DIAGNOSIS — I4892 Unspecified atrial flutter: Secondary | ICD-10-CM

## 2019-11-29 ENCOUNTER — Ambulatory Visit: Payer: Medicare PPO | Admitting: Cardiology

## 2019-11-29 ENCOUNTER — Institutional Professional Consult (permissible substitution): Payer: Medicare PPO | Admitting: Cardiology

## 2019-11-29 ENCOUNTER — Encounter: Payer: Self-pay | Admitting: Cardiology

## 2019-11-29 ENCOUNTER — Other Ambulatory Visit: Payer: Self-pay

## 2019-11-29 DIAGNOSIS — I4892 Unspecified atrial flutter: Secondary | ICD-10-CM | POA: Insufficient documentation

## 2019-11-29 NOTE — Patient Instructions (Addendum)
Medication Instructions:  Your physician recommends that you continue on your current medications as directed. Please refer to the Current Medication list given to you today.  Labwork: None ordered.  Testing/Procedures: Your physician has recommended that you have a Cardioversion (DCCV). Electrical Cardioversion uses a jolt of electricity to your heart either through paddles or wired patches attached to your chest. This is a controlled, usually prescheduled, procedure. Defibrillation is done under light anesthesia in the hospital, and you usually go home the day of the procedure. This is done to get your heart back into a normal rhythm. You are not awake for the procedure. Please see the instruction sheet given to you today.   Follow-Up: Your physician wants you to follow-up:  December 27, 2019 at 3:15 pm with Dr. Quentin Ore.   Any Other Special Instructions Will Be Listed Below (If Applicable).  If you need a refill on your cardiac medications before your next appointment, please call your pharmacy.   CARDIOVERSION INSTRUCTIONS:  COVID TEST- December 14, 2019 at 11:00 am  LAB This is a Drive Up Visitat [address] [place], [place] [address] Someone will direct you to the appropriate testing line. Stay in your car and someone will be with you shortly.     You are scheduled for A Cardioversion on December 16, 2019 with Dr. Sallyanne Kuster.    Please arrive at the [facility] (Main Entrance A) at [facility]: [address] [place], [place] [address] at 6:00 AM.   DIET: Nothing to eat or drink after midnight except a sip of water with medications (see medication instructions below)  Medication Instructions: TAKE all your normal morning medications with a sip of water  You will need to continue your anticoagulant after your procedure until you            are told by your  Provider that it is safe to stop   Labs: on arrival  You must have a responsible person to  drive you home and stay in the waiting area during your procedure. Failure to do so could result in cancellation.  Bring your insurance cards.  *Special Note: Every effort is made to have your procedure done on time. Occasionally there are emergencies that occur at the hospital that may cause delays. Please be patient if a delay does occur.

## 2019-11-29 NOTE — Progress Notes (Signed)
Electrophysiology Office Note:    Date:  11/29/2019   ID:  [name], DOB 10-Mar-1935, MRN [id number]  PCP:  Janith Lima, MD  [place] Cardiologist:  No primary care provider on file.  [facility] Electrophysiologist:  None   Referring MD: Josue Hector, MD   Chief Complaint: Atrial flutter  History of Present Illness:    RAKWON LETOURNEAU is a 84 y.o. male who presents for an evaluation of atrial flutter at the request of Dr. Johnsie Cancel. Their medical history includes CLL, hyperlipidemia.  He last saw Dr. Johnsie Cancel on November 10.  At that appointment, atrial flutter was picked up incidentally.  He is asymptomatic with his atrial flutter.  He is very active, playing golf and walking over 2 miles every day.  He was started on Eliquis and has tolerated this medication without a bleeding issues.     Past Medical History:  Diagnosis Date  . Arthritis   . Hyperlipidemia   . Leukemia, chronic lymphocytic ([place])   . Shingles   . Vitamin D deficiency     Past Surgical History:  Procedure Laterality Date  . EYE SURGERY    . LAPAROSCOPIC TRANS ANAL AND TRANSABDOMINAL RECTAL RESECTION WITH COLOANAL ANASTOMOSIS    . SURGERY OF LIP     tumor  . TONSILLECTOMY    . VASECTOMY      Current Medications: Current Meds  Medication Sig  . apixaban (ELIQUIS) 5 MG TABS tablet Take 1 tablet (5 mg total) by mouth 2 (two) times daily.  Marland Kitchen atorvastatin (LIPITOR) 20 MG tablet Take 1 tablet (20 mg total) by mouth daily. Must keep upcoming appt for future refills  . betamethasone dipropionate 0.05 % cream APR OF HANDS  . Multiple Vitamins-Minerals (CENTRUM SILVER PO) Take 1 tablet by mouth daily.      Allergies:   Azithromycin   Social History   Socioeconomic History  . Marital status: Married    Spouse name: Not on file  . Number of children: 2  . Years of education: Not on file  . Highest education level: Not on file  Occupational History  . Occupation: Retired     Comment: Professor [employer]  Tobacco Use  . Smoking status: Never Smoker  . Smokeless tobacco: Never Used  Vaping Use  . Vaping Use: Never used  Substance and Sexual Activity  . Alcohol use: No  . Drug use: No  . Sexual activity: Not on file  Other Topics Concern  . Not on file  Social History Narrative  . Not on file   Social Determinants of Health   Financial Resource Strain:   . Difficulty of Paying Living Expenses: Not on file  Food Insecurity:   . Worried About Charity fundraiser in the Last Year: Not on file  . Ran Out of Food in the Last Year: Not on file  Transportation Needs:   . Lack of Transportation (Medical): Not on file  . Lack of Transportation (Non-Medical): Not on file  Physical Activity:   . Days of Exercise per Week: Not on file  . Minutes of Exercise per Session: Not on file  Stress:   . Feeling of Stress : Not on file  Social Connections:   . Frequency of Communication with Friends and Family: Not on file  . Frequency of Social Gatherings with Friends and Family: Not on file  . Attends Religious Services: Not on file  . Active Member of Clubs or Organizations: Not on  file  . Attends Archivist Meetings: Not on file  . Marital Status: Not on file     Family History: The patient's family history includes Asthma in his sister; COPD in his maternal grandmother; Cancer in his mother; Colon cancer in his sister; Diabetes in his paternal grandmother; Emphysema in his mother; Heart disease in his father; Hyperlipidemia in his sister; Mental illness in his paternal grandmother; Stroke in his maternal grandfather and sister.  ROS:   Please see the history of present illness.    All other systems reviewed and are negative.  EKGs/Labs/Other Studies Reviewed:    The following studies were reviewed today: EKG  EKG:  The ekg ordered today demonstrates typical appearing atrial flutter  Recent Labs: 03/28/2019: TSH 5.91 11/14/2019: ALT 21; BUN 18;  Creatinine 1.11; Hemoglobin 15.9; Platelet Count 158; Potassium 4.4; Sodium 139  Recent Lipid Panel    Component Value Date/Time   CHOL 120 03/28/2019 0858   CHOL 136 02/17/2017 0832   TRIG 89.0 03/28/2019 0858   HDL 26.80 (L) 03/28/2019 0858   HDL 29 (L) 02/17/2017 0832   CHOLHDL 4 03/28/2019 0858   VLDL 17.8 03/28/2019 0858   LDLCALC 75 03/28/2019 0858   LDLCALC 82 02/17/2017 0832    Physical Exam:    VS:  BP 104/80   Pulse 76   Ht 5' 11.5" (1.816 m)   Wt 215 lb (97.5 kg)   SpO2 97%   BMI 29.57 kg/m     Wt Readings from Last 3 Encounters:  11/29/19 215 lb (97.5 kg)  11/23/19 213 lb 12.8 oz (97 kg)  11/23/19 215 lb 9.6 oz (97.8 kg)     GEN:  Well nourished, well developed in no acute distress.  Looks younger than stated age 70: Normal NECK: No JVD; No carotid bruits LYMPHATICS: No lymphadenopathy CARDIAC: Irregularly irregular, no murmurs, rubs, gallops RESPIRATORY:  Clear to auscultation without rales, wheezing or rhonchi  ABDOMEN: Soft, non-tender, non-distended MUSCULOSKELETAL:  No edema; No deformity  SKIN: Warm and dry NEUROLOGIC:  Alert and oriented x 3 PSYCHIATRIC:  Normal affect   ASSESSMENT:    1. Atrial flutter, unspecified type ([place])    PLAN:    In order of problems listed above:  1. Atrial flutter Patient with a new diagnosis atrial flutter.  Tolerating Eliquis for stroke prophylaxis.  Heart rates are well controlled.  He has been on blood thinner for about 1 week so far.  I would like to trial cardioversion in Mr. Panuco.  We will plan on scheduling a cardioversion for early December after he has been on at least 3 to 4 weeks of blood thinner.  I will plan to see him back after the cardioversion and after his echo was performed to discuss ongoing management.  At this point, would favor a watchful waiting approach.  If he develops recurrent atrial flutter can discuss a more durable rhythm control strategy including ablation   Medication  Adjustments/Labs and Tests Ordered: Current medicines are reviewed at length with the patient today.  Concerns regarding medicines are outlined above.  Orders Placed This Encounter  Procedures  . EKG 12-Lead   No orders of the defined types were placed in this encounter.    Signed, Lars Mage, MD, [facility]  11/29/2019 5:11 PM    Electrophysiology [place]

## 2019-11-29 NOTE — H&P (View-Only) (Signed)
Electrophysiology Office Note:    Date:  11/29/2019   ID:  Francisco Larson, DOB 04-19-1935, MRN [id number]  PCP:  Janith Lima, MD  [place] Cardiologist:  No primary care provider on file.  [facility] Electrophysiologist:  None   Referring MD: Josue Hector, MD   Chief Complaint: Atrial flutter  History of Present Illness:    Francisco Larson is a 84 y.o. male who presents for an evaluation of atrial flutter at the request of Dr. Johnsie Cancel. Their medical history includes CLL, hyperlipidemia.  He last saw Dr. Johnsie Cancel on November 10.  At that appointment, atrial flutter was picked up incidentally.  He is asymptomatic with his atrial flutter.  He is very active, playing golf and walking over 2 miles every day.  He was started on Eliquis and has tolerated this medication without a bleeding issues.     Past Medical History:  Diagnosis Date  . Arthritis   . Hyperlipidemia   . Leukemia, chronic lymphocytic ([place])   . Shingles   . Vitamin D deficiency     Past Surgical History:  Procedure Laterality Date  . EYE SURGERY    . LAPAROSCOPIC TRANS ANAL AND TRANSABDOMINAL RECTAL RESECTION WITH COLOANAL ANASTOMOSIS    . SURGERY OF LIP     tumor  . TONSILLECTOMY    . VASECTOMY      Current Medications: Current Meds  Medication Sig  . apixaban (ELIQUIS) 5 MG TABS tablet Take 1 tablet (5 mg total) by mouth 2 (two) times daily.  Marland Kitchen atorvastatin (LIPITOR) 20 MG tablet Take 1 tablet (20 mg total) by mouth daily. Must keep upcoming appt for future refills  . betamethasone dipropionate 0.05 % cream APR OF HANDS  . Multiple Vitamins-Minerals (CENTRUM SILVER PO) Take 1 tablet by mouth daily.      Allergies:   Azithromycin   Social History   Socioeconomic History  . Marital status: Married    Spouse name: Not on file  . Number of children: 2  . Years of education: Not on file  . Highest education level: Not on file  Occupational History  . Occupation: Retired     Comment: Professor [employer]  Tobacco Use  . Smoking status: Never Smoker  . Smokeless tobacco: Never Used  Vaping Use  . Vaping Use: Never used  Substance and Sexual Activity  . Alcohol use: No  . Drug use: No  . Sexual activity: Not on file  Other Topics Concern  . Not on file  Social History Narrative  . Not on file   Social Determinants of Health   Financial Resource Strain:   . Difficulty of Paying Living Expenses: Not on file  Food Insecurity:   . Worried About Charity fundraiser in the Last Year: Not on file  . Ran Out of Food in the Last Year: Not on file  Transportation Needs:   . Lack of Transportation (Medical): Not on file  . Lack of Transportation (Non-Medical): Not on file  Physical Activity:   . Days of Exercise per Week: Not on file  . Minutes of Exercise per Session: Not on file  Stress:   . Feeling of Stress : Not on file  Social Connections:   . Frequency of Communication with Friends and Family: Not on file  . Frequency of Social Gatherings with Friends and Family: Not on file  . Attends Religious Services: Not on file  . Active Member of Clubs or Organizations: Not on  file  . Attends Archivist Meetings: Not on file  . Marital Status: Not on file     Family History: The patient's family history includes Asthma in his sister; COPD in his maternal grandmother; Cancer in his mother; Colon cancer in his sister; Diabetes in his paternal grandmother; Emphysema in his mother; Heart disease in his father; Hyperlipidemia in his sister; Mental illness in his paternal grandmother; Stroke in his maternal grandfather and sister.  ROS:   Please see the history of present illness.    All other systems reviewed and are negative.  EKGs/Labs/Other Studies Reviewed:    The following studies were reviewed today: EKG  EKG:  The ekg ordered today demonstrates typical appearing atrial flutter  Recent Labs: 03/28/2019: TSH 5.91 11/14/2019: ALT 21; BUN 18;  Creatinine 1.11; Hemoglobin 15.9; Platelet Count 158; Potassium 4.4; Sodium 139  Recent Lipid Panel    Component Value Date/Time   CHOL 120 03/28/2019 0858   CHOL 136 02/17/2017 0832   TRIG 89.0 03/28/2019 0858   HDL 26.80 (L) 03/28/2019 0858   HDL 29 (L) 02/17/2017 0832   CHOLHDL 4 03/28/2019 0858   VLDL 17.8 03/28/2019 0858   LDLCALC 75 03/28/2019 0858   LDLCALC 82 02/17/2017 0832    Physical Exam:    VS:  BP 104/80   Pulse 76   Ht 5' 11.5" (1.816 m)   Wt 215 lb (97.5 kg)   SpO2 97%   BMI 29.57 kg/m     Wt Readings from Last 3 Encounters:  11/29/19 215 lb (97.5 kg)  11/23/19 213 lb 12.8 oz (97 kg)  11/23/19 215 lb 9.6 oz (97.8 kg)     GEN:  Well nourished, well developed in no acute distress.  Looks younger than stated age 71: Normal NECK: No JVD; No carotid bruits LYMPHATICS: No lymphadenopathy CARDIAC: Irregularly irregular, no murmurs, rubs, gallops RESPIRATORY:  Clear to auscultation without rales, wheezing or rhonchi  ABDOMEN: Soft, non-tender, non-distended MUSCULOSKELETAL:  No edema; No deformity  SKIN: Warm and dry NEUROLOGIC:  Alert and oriented x 3 PSYCHIATRIC:  Normal affect   ASSESSMENT:    1. Atrial flutter, unspecified type ([place])    PLAN:    In order of problems listed above:  1. Atrial flutter Patient with a new diagnosis atrial flutter.  Tolerating Eliquis for stroke prophylaxis.  Heart rates are well controlled.  He has been on blood thinner for about 1 week so far.  I would like to trial cardioversion in Mr. [place]low.  We will plan on scheduling a cardioversion for early December after he has been on at least 3 to 4 weeks of blood thinner.  I will plan to see him back after the cardioversion and after his echo was performed to discuss ongoing management.  At this point, would favor a watchful waiting approach.  If he develops recurrent atrial flutter can discuss a more durable rhythm control strategy including ablation   Medication  Adjustments/Labs and Tests Ordered: Current medicines are reviewed at length with the patient today.  Concerns regarding medicines are outlined above.  Orders Placed This Encounter  Procedures  . EKG 12-Lead   No orders of the defined types were placed in this encounter.    Signed, Lars Mage, MD, [facility]  11/29/2019 5:11 PM    Electrophysiology [place]

## 2019-12-14 ENCOUNTER — Other Ambulatory Visit (HOSPITAL_COMMUNITY)
Admission: RE | Admit: 2019-12-14 | Discharge: 2019-12-14 | Disposition: A | Discharge status: Home / Self Care | Payer: Medicare PPO | Source: Ambulatory Visit | Attending: Cardiovascular Disease | Admitting: Cardiovascular Disease

## 2019-12-14 DIAGNOSIS — Z20822 Contact with and (suspected) exposure to covid-19: Secondary | ICD-10-CM | POA: Insufficient documentation

## 2019-12-14 DIAGNOSIS — Z01812 Encounter for preprocedural laboratory examination: Secondary | ICD-10-CM | POA: Diagnosis not present

## 2019-12-14 LAB — SARS CORONAVIRUS 2 (TAT 6-24 HRS): SARS Coronavirus 2: NEGATIVE

## 2019-12-15 NOTE — Anesthesia Preprocedure Evaluation (Addendum)
Anesthesia Evaluation  Patient identified by MRN, date of birth, ID band Patient awake    Reviewed: Allergy & Precautions, NPO status , Patient's Chart, lab work & pertinent test results  History of Anesthesia Complications Negative for: history of anesthetic complications  Airway Mallampati: II  TM Distance: >3 FB Neck ROM: Full    Dental  (+) Dental Advisory Given   Pulmonary neg pulmonary ROS,  12/14/2019 SARS coronavirus NEG   Pulmonary exam normal        Cardiovascular (-) hypertension+ dysrhythmias Atrial Fibrillation  Rhythm:Regular Rate:Normal     Neuro/Psych negative neurological ROS     GI/Hepatic Neg liver ROS, GERD  Controlled,  Endo/Other  negative endocrine ROS  Renal/GU Renal InsufficiencyRenal disease     Musculoskeletal  (+) Arthritis ,   Abdominal   Peds  Hematology  (+) Blood dyscrasia (CLL), , eliquis   Anesthesia Other Findings   Reproductive/Obstetrics                            Anesthesia Physical Anesthesia Plan  ASA: III  Anesthesia Plan: General   Post-op Pain Management:    Induction: Intravenous  PONV Risk Score and Plan: 2 and Treatment may vary due to age or medical condition  Airway Management Planned: Natural Airway and Mask  Additional Equipment: None  Intra-op Plan:   Post-operative Plan:   Informed Consent: I have reviewed the patients History and Physical, chart, labs and discussed the procedure including the risks, benefits and alternatives for the proposed anesthesia with the patient or authorized representative who has indicated his/her understanding and acceptance.     Dental advisory given  Plan Discussed with: CRNA and Surgeon  Anesthesia Plan Comments:        Anesthesia Quick Evaluation

## 2019-12-16 ENCOUNTER — Encounter (HOSPITAL_COMMUNITY): Payer: Self-pay | Admitting: Cardiovascular Disease

## 2019-12-16 ENCOUNTER — Encounter (HOSPITAL_COMMUNITY)
Admission: RE | Disposition: A | Discharge status: Home / Self Care | Payer: Self-pay | Source: Home / Self Care | Attending: Cardiovascular Disease

## 2019-12-16 ENCOUNTER — Other Ambulatory Visit: Payer: Self-pay

## 2019-12-16 ENCOUNTER — Ambulatory Visit (HOSPITAL_COMMUNITY): Payer: Medicare PPO | Admitting: Certified Registered Nurse Anesthetist

## 2019-12-16 ENCOUNTER — Ambulatory Visit (HOSPITAL_COMMUNITY)
Admission: RE | Admit: 2019-12-16 | Discharge: 2019-12-16 | Disposition: A | Discharge status: Home / Self Care | Payer: Medicare PPO | Attending: Cardiovascular Disease | Admitting: Cardiovascular Disease

## 2019-12-16 DIAGNOSIS — Z8619 Personal history of other infectious and parasitic diseases: Secondary | ICD-10-CM | POA: Insufficient documentation

## 2019-12-16 DIAGNOSIS — Z79899 Other long term (current) drug therapy: Secondary | ICD-10-CM | POA: Insufficient documentation

## 2019-12-16 DIAGNOSIS — Z881 Allergy status to other antibiotic agents status: Secondary | ICD-10-CM | POA: Diagnosis not present

## 2019-12-16 DIAGNOSIS — I4819 Other persistent atrial fibrillation: Secondary | ICD-10-CM | POA: Diagnosis not present

## 2019-12-16 DIAGNOSIS — I4892 Unspecified atrial flutter: Secondary | ICD-10-CM | POA: Insufficient documentation

## 2019-12-16 DIAGNOSIS — Z8249 Family history of ischemic heart disease and other diseases of the circulatory system: Secondary | ICD-10-CM | POA: Insufficient documentation

## 2019-12-16 DIAGNOSIS — Z856 Personal history of leukemia: Secondary | ICD-10-CM | POA: Diagnosis not present

## 2019-12-16 DIAGNOSIS — I4891 Unspecified atrial fibrillation: Secondary | ICD-10-CM | POA: Diagnosis not present

## 2019-12-16 DIAGNOSIS — Z7901 Long term (current) use of anticoagulants: Secondary | ICD-10-CM | POA: Diagnosis not present

## 2019-12-16 DIAGNOSIS — N1831 Chronic kidney disease, stage 3a: Secondary | ICD-10-CM | POA: Diagnosis not present

## 2019-12-16 DIAGNOSIS — K219 Gastro-esophageal reflux disease without esophagitis: Secondary | ICD-10-CM | POA: Diagnosis not present

## 2019-12-16 HISTORY — PX: CARDIOVERSION: SHX1299

## 2019-12-16 LAB — POCT I-STAT, CHEM 8
BUN: 16 mg/dL (ref 8–23)
Calcium, Ion: 1.2 mmol/L (ref 1.15–1.40)
Chloride: 105 mmol/L (ref 98–111)
Creatinine, Ser: 1 mg/dL (ref 0.61–1.24)
Glucose, Bld: 119 mg/dL — ABNORMAL HIGH (ref 70–99)
HCT: 48 % (ref 39.0–52.0)
Hemoglobin: 16.3 g/dL (ref 13.0–17.0)
Potassium: 4.6 mmol/L (ref 3.5–5.1)
Sodium: 139 mmol/L (ref 135–145)
TCO2: 25 mmol/L (ref 22–32)

## 2019-12-16 SURGERY — CARDIOVERSION
Anesthesia: General

## 2019-12-16 MED ORDER — PROPOFOL 10 MG/ML IV BOLUS
INTRAVENOUS | Status: DC | PRN
  Administered 2019-12-16: 70 mg via INTRAVENOUS

## 2019-12-16 MED ORDER — LIDOCAINE 2% (20 MG/ML) 5 ML SYRINGE
INTRAMUSCULAR | Status: DC | PRN
  Administered 2019-12-16: 30 mg via INTRAVENOUS

## 2019-12-16 NOTE — Op Note (Signed)
Procedure: Electrical Cardioversion Indications:  Atrial Fibrillation  Procedure Details:  Consent: Risks of procedure as well as the alternatives and risks of each were explained to the (patient/caregiver).  Consent for procedure obtained.  Time Out: Verified patient identification, verified procedure, site/side was marked, verified correct patient position, special equipment/implants available, medications/allergies/relevent history reviewed, required imaging and test results available.  Performed  Patient placed on cardiac monitor, pulse oximetry, supplemental oxygen as necessary.  Sedation given: propofol 70 mg IV Pacer pads placed anterior and posterior chest.  Cardioverted 1 time(s).  Cardioversion with synchronized biphasic 120J shock.  Evaluation: Findings: Post procedure EKG shows: NSR with 1st degree AV block Complications: None Patient did tolerate procedure well.  Time Spent Directly with the Patient:  30 minutes   Francisco Larson 12/16/2019, 8:01 AM

## 2019-12-16 NOTE — Anesthesia Postprocedure Evaluation (Signed)
Anesthesia Post Note  Patient: Francisco Larson  Procedure(s) Performed: CARDIOVERSION (N/A )     Patient location during evaluation: Endoscopy Anesthesia Type: General Level of consciousness: awake and alert, patient cooperative and oriented Pain management: pain level controlled Vital Signs Assessment: post-procedure vital signs reviewed and stable Respiratory status: spontaneous breathing, nonlabored ventilation and respiratory function stable Cardiovascular status: stable and blood pressure returned to baseline Postop Assessment: no apparent nausea or vomiting and able to ambulate Anesthetic complications: no   No complications documented.  Last Vitals:  Vitals:   12/16/19 0817 12/16/19 0827  BP: (!) 157/89 (!) 152/80  Pulse: 70 67  Resp: 19 15  Temp:    SpO2: 98% 98%    Last Pain:  Vitals:   12/16/19 0827  TempSrc:   PainSc: 0-No pain                 Ceylon Arenson,E. Barbarajean Kinzler

## 2019-12-16 NOTE — Transfer of Care (Signed)
Immediate Anesthesia Transfer of Care Note  Patient: Francisco Larson  Procedure(s) Performed: CARDIOVERSION (N/A )  Patient Location: Endoscopy Unit  Anesthesia Type:General  Level of Consciousness: drowsy  Airway & Oxygen Therapy: Patient Spontanous Breathing  Post-op Assessment: Report given to RN and Post -op Vital signs reviewed and stable  Post vital signs: Reviewed and stable  Last Vitals:  Vitals Value Taken Time  BP    Temp    Pulse    Resp    SpO2      Last Pain:  Vitals:   12/16/19 0657  TempSrc: Oral  PainSc: 0-No pain         Complications: No complications documented.

## 2019-12-16 NOTE — Interval H&P Note (Signed)
History and Physical Interval Note:  12/16/2019 8:51 AM  Francisco Larson  has presented today for surgery, with the diagnosis of ATRIAL FLUTTER.  The various methods of treatment have been discussed with the patient and family. After consideration of risks, benefits and other options for treatment, the patient has consented to  Procedure(s): CARDIOVERSION (N/A) as a surgical intervention.  The patient's history has been reviewed, patient examined, no change in status, stable for surgery.  I have reviewed the patient's chart and labs.  Questions were answered to the patient's satisfaction.     [place]

## 2019-12-16 NOTE — Discharge Instructions (Signed)
Electrical Cardioversion Electrical cardioversion is the delivery of a jolt of electricity to restore a normal rhythm to the heart. A rhythm that is too fast or is not regular keeps the heart from pumping well. In this procedure, sticky patches or metal paddles are placed on the chest to deliver electricity to the heart from a device. This procedure may be done in an emergency if:  There is low or no blood pressure as a result of the heart rhythm.  Normal rhythm must be restored as fast as possible to protect the brain and heart from further damage.  It may save a life. This may also be a scheduled procedure for irregular or fast heart rhythms that are not immediately life-threatening. Tell a health care provider about:  Any allergies you have.  All medicines you are taking, including vitamins, herbs, eye drops, creams, and over-the-counter medicines.  Any problems you or family members have had with anesthetic medicines.  Any blood disorders you have.  Any surgeries you have had.  Any medical conditions you have.  Whether you are pregnant or may be pregnant. What are the risks? Generally, this is a safe procedure. However, problems may occur, including:  Allergic reactions to medicines.  A blood clot that breaks free and travels to other parts of your body.  The possible return of an abnormal heart rhythm within hours or days after the procedure.  Your heart stopping (cardiac arrest). This is rare. What happens before the procedure? Medicines  Your health care provider may have you start taking: ? Blood-thinning medicines (anticoagulants) so your blood does not clot as easily. ? Medicines to help stabilize your heart rate and rhythm.  Ask your health care provider about: ? Changing or stopping your regular medicines. This is especially important if you are taking diabetes medicines or blood thinners. ? Taking medicines such as aspirin and ibuprofen. These medicines can  thin your blood. Do not take these medicines unless your health care provider tells you to take them. ? Taking over-the-counter medicines, vitamins, herbs, and supplements. General instructions  Follow instructions from your health care provider about eating or drinking restrictions.  Plan to have someone take you home from the hospital or clinic.  If you will be going home right after the procedure, plan to have someone with you for 24 hours.  Ask your health care provider what steps will be taken to help prevent infection. These may include washing your skin with a germ-killing soap. What happens during the procedure?   An IV will be inserted into one of your veins.  Sticky patches (electrodes) or metal paddles may be placed on your chest.  You will be given a medicine to help you relax (sedative).  An electrical shock will be delivered. The procedure may vary among health care providers and hospitals. What can I expect after the procedure?  Your blood pressure, heart rate, breathing rate, and blood oxygen level will be monitored until you leave the hospital or clinic.  Your heart rhythm will be watched to make sure it does not change.  You may have some redness on the skin where the shocks were given. Follow these instructions at home:  Do not drive for 24 hours if you were given a sedative during your procedure.  Take over-the-counter and prescription medicines only as told by your health care provider.  Ask your health care provider how to check your pulse. Check it often.  Rest for 48 hours after the procedure or   as told by your health care provider.  Avoid or limit your caffeine use as told by your health care provider.  Keep all follow-up visits as told by your health care provider. This is important. Contact a health care provider if:  You feel like your heart is beating too quickly or your pulse is not regular.  You have a serious muscle cramp that does not go  away. Get help right away if:  You have discomfort in your chest.  You are dizzy or you feel faint.  You have trouble breathing or you are short of breath.  Your speech is slurred.  You have trouble moving an arm or leg on one side of your body.  Your fingers or toes turn cold or blue. Summary  Electrical cardioversion is the delivery of a jolt of electricity to restore a normal rhythm to the heart.  This procedure may be done right away in an emergency or may be a scheduled procedure if the condition is not an emergency.  Generally, this is a safe procedure.  After the procedure, check your pulse often as told by your health care provider. This information is not intended to replace advice given to you by your health care provider. Make sure you discuss any questions you have with your health care provider. Document Revised: 08/02/2018 Document Reviewed: 08/02/2018 Elsevier Patient Education  2020 Elsevier Inc.  

## 2019-12-18 ENCOUNTER — Encounter (HOSPITAL_COMMUNITY): Payer: Self-pay | Admitting: Cardiovascular Disease

## 2019-12-19 DIAGNOSIS — I4892 Unspecified atrial flutter: Secondary | ICD-10-CM | POA: Diagnosis not present

## 2019-12-21 ENCOUNTER — Other Ambulatory Visit: Payer: Self-pay

## 2019-12-21 ENCOUNTER — Ambulatory Visit (HOSPITAL_COMMUNITY): Payer: Medicare PPO | Attending: Internal Medicine

## 2019-12-21 DIAGNOSIS — I4892 Unspecified atrial flutter: Secondary | ICD-10-CM | POA: Diagnosis not present

## 2019-12-21 LAB — ECHOCARDIOGRAM COMPLETE
AR max vel: 1.9 cm2
AV Area VTI: 2.08 cm2
AV Area mean vel: 1.81 cm2
AV Mean grad: 8 mmHg
AV Peak grad: 16.6 mmHg
Ao pk vel: 2.04 m/s
Area-P 1/2: 3.83 cm2
S' Lateral: 2.3 cm

## 2019-12-26 DIAGNOSIS — H2513 Age-related nuclear cataract, bilateral: Secondary | ICD-10-CM | POA: Diagnosis not present

## 2019-12-26 DIAGNOSIS — H52203 Unspecified astigmatism, bilateral: Secondary | ICD-10-CM | POA: Diagnosis not present

## 2019-12-27 ENCOUNTER — Encounter: Payer: Self-pay | Admitting: Cardiology

## 2019-12-27 ENCOUNTER — Ambulatory Visit: Payer: Medicare PPO | Admitting: Cardiology

## 2019-12-27 ENCOUNTER — Other Ambulatory Visit: Payer: Self-pay

## 2019-12-27 VITALS — BP 160/80 | HR 67 | Ht 71.0 in | Wt 216.0 lb

## 2019-12-27 DIAGNOSIS — I4892 Unspecified atrial flutter: Secondary | ICD-10-CM | POA: Diagnosis not present

## 2019-12-27 NOTE — Patient Instructions (Addendum)
Medication Instructions:  Your physician recommends that you continue on your current medications as directed. Please refer to the Current Medication list given to you today.  *If you need a refill on your cardiac medications before your next appointment, please call your pharmacy*   Lab Work: None ordered.  If you have labs (blood work) drawn today and your tests are completely normal, you will receive your results only by: Marland Kitchen MyChart Message (if you have MyChart) OR . A paper copy in the mail If you have any lab test that is abnormal or we need to change your treatment, we will call you to review the results.   Testing/Procedures:  Loop Recorder Implant Your physician has recommended that you have a loop Recorder inserted. A Loop Recorder is a small device that is placed under the skin of your chest to help monitor abnormal heart rhythms.   Follow-Up: At [facility], you and your health needs are our priority.  As part of our continuing mission to provide you with exceptional heart care, we have created designated Provider Care Teams.  These Care Teams include your primary Cardiologist (physician) and Advanced Practice Providers (APPs -  Physician Assistants and Nurse Practitioners) who all work together to provide you with the care you need, when you need it.  We recommend signing up for the patient portal called "MyChart".  Sign up information is provided on this After Visit Summary.  MyChart is used to connect with patients for Virtual Visits (Telemedicine).  Patients are able to view lab/test results, encounter notes, upcoming appointments, etc.  Non-urgent messages can be sent to your provider as well.   To learn more about what you can do with MyChart, go to [email or web address].    Your next appointment: Loop Recorder Implant with Dr Quentin Ore Wednesday, 01/03/2020 at 930am.

## 2019-12-27 NOTE — Progress Notes (Signed)
Electrophysiology Office Follow up Visit Note:    Date:  12/27/2019   ID:  Francisco Larson, DOB 03/18/1935, MRN [id number]  PCP:  Francisco Lima, MD  [place] Cardiologist:  No primary care provider on file.  [facility] Electrophysiologist:  Francisco Epley, MD    Interval History:    Francisco Larson is a 84 y.o. male who presents for a follow up visit. They were last seen in clinic 11/29/2019 for atrial flutter.  At that appointment, the patient was scheduled for a cardioversion which was successfully performed on 12/16/2019.  He has been anticoagulated on Eliquis since the cardioversion and has maintained normal rhythm.  He would like to pursue a medication regimen that does not include long-term anticoagulation.  He has always been asymptomatic while in atrial flutter.  His ventricular rates and atrial flutter closely mimic those of normal rhythm.  His heart rhythm today normal sinus rhythm at 67 bpm and his last EKG of atrial flutter was 60 to 70 bpm.     Past Medical History:  Diagnosis Date  . Arthritis   . Hyperlipidemia   . Leukemia, chronic lymphocytic ([place])   . Shingles   . Vitamin D deficiency     Past Surgical History:  Procedure Laterality Date  . CARDIOVERSION N/A 12/16/2019   Procedure: CARDIOVERSION;  Surgeon: Francisco Klein, MD;  Location: [facility] ENDOSCOPY;  Service: Cardiovascular;  Laterality: N/A;  . EYE SURGERY    . LAPAROSCOPIC TRANS ANAL AND TRANSABDOMINAL RECTAL RESECTION WITH COLOANAL ANASTOMOSIS    . SURGERY OF LIP     tumor  . TONSILLECTOMY    . VASECTOMY      Current Medications: Current Meds  Medication Sig  . apixaban (ELIQUIS) 5 MG TABS tablet Take 1 tablet (5 mg total) by mouth 2 (two) times daily.  Marland Kitchen atorvastatin (LIPITOR) 20 MG tablet Take 1 tablet (20 mg total) by mouth daily. Must keep upcoming appt for future refills  . betamethasone dipropionate 0.05 % cream Apply 1 application topically daily as needed (Dryness on Hand).    . Multiple Vitamins-Minerals (CENTRUM SILVER PO) Take 1 tablet by mouth daily.      Allergies:   Azithromycin   Social History   Socioeconomic History  . Marital status: Married    Spouse name: Not on file  . Number of children: 2  . Years of education: Not on file  . Highest education level: Not on file  Occupational History  . Occupation: Retired    Comment: Professor [employer]  Tobacco Use  . Smoking status: Never Smoker  . Smokeless tobacco: Never Used  Vaping Use  . Vaping Use: Never used  Substance and Sexual Activity  . Alcohol use: No  . Drug use: No  . Sexual activity: Not on file  Other Topics Concern  . Not on file  Social History Narrative  . Not on file   Social Determinants of Health   Financial Resource Strain: Not on file  Food Insecurity: Not on file  Transportation Needs: Not on file  Physical Activity: Not on file  Stress: Not on file  Social Connections: Not on file     Family History: The patient's family history includes Asthma in his sister; COPD in his maternal grandmother; Cancer in his mother; Colon cancer in his sister; Diabetes in his paternal grandmother; Emphysema in his mother; Heart disease in his father; Hyperlipidemia in his sister; Mental illness in his paternal grandmother; Stroke in his maternal grandfather  and sister.  ROS:   Please see the history of present illness.    All other systems reviewed and are negative.  EKGs/Labs/Other Studies Reviewed:    The following studies were reviewed today: Prior notes, ECGs  EKG:  The ekg ordered today demonstrates sinus rhythm at 67 beats per minute  Recent Labs: 03/28/2019: TSH 5.91 11/14/2019: ALT 21; Platelet Count 158 12/16/2019: BUN 16; Creatinine, Ser 1.00; Hemoglobin 16.3; Potassium 4.6; Sodium 139  Recent Lipid Panel    Component Value Date/Time   CHOL 120 03/28/2019 0858   CHOL 136 02/17/2017 0832   TRIG 89.0 03/28/2019 0858   HDL 26.80 (L) 03/28/2019 0858   HDL 29 (L)  02/17/2017 0832   CHOLHDL 4 03/28/2019 0858   VLDL 17.8 03/28/2019 0858   LDLCALC 75 03/28/2019 0858   LDLCALC 82 02/17/2017 0832    Physical Exam:    VS:  BP (!) 160/80   Pulse 67   Ht 5\' 11"  (1.803 m)   Wt 216 lb (98 kg)   SpO2 97%   BMI 30.13 kg/m     Wt Readings from Last 3 Encounters:  12/27/19 216 lb (98 kg)  12/16/19 215 lb (97.5 kg)  11/29/19 215 lb (97.5 kg)     GEN:  Well nourished, well developed in no acute distress HEENT: Normal NECK: No JVD; No carotid bruits LYMPHATICS: No lymphadenopathy CARDIAC: RRR, no murmurs, rubs, gallops RESPIRATORY:  Clear to auscultation without rales, wheezing or rhonchi  ABDOMEN: Soft, non-tender, non-distended MUSCULOSKELETAL:  No edema; No deformity  SKIN: Warm and dry NEUROLOGIC:  Alert and oriented x 3 PSYCHIATRIC:  Normal affect   ASSESSMENT:    1. Atrial flutter, unspecified type ([place])    PLAN:    In order of problems listed above:  1. Atrial flutter Post cardioversion 12/16/2019.  He has done well anticoagulation after cardioversion.  Would like him to continue the anticoagulation for least 4 weeks after cardioversion.  The patient would like to pursue a course of therapy that does not include long-term anticoagulation if possible given its associated risks.  Given the patient's inability to know when he is in or out of rhythm we will need a strategy for ongoing surveillance for recurrence of atrial fibrillation or flutter.  His heart rates are not a reliable marker given his ventricular rates during atrial flutter are very similar to that during sinus rhythm.  I think the best option would be to implant a loop recorder for ongoing monitoring of his heart rhythm.  We will plan on implanting this later this month.  I discussed the loop recorder implant in detail with the patient including the risks and expected recovery and he and his wife would like to proceed with scheduling.   Medication Adjustments/Labs and Tests  Ordered: Current medicines are reviewed at length with the patient today.  Concerns regarding medicines are outlined above.  Orders Placed This Encounter  Procedures  . EKG 12-Lead   No orders of the defined types were placed in this encounter.    Signed, Lars Mage, MD, [facility]  12/27/2019 8:59 PM    Electrophysiology  [facility]

## 2020-01-03 ENCOUNTER — Ambulatory Visit: Payer: Medicare PPO | Admitting: Cardiology

## 2020-01-03 ENCOUNTER — Other Ambulatory Visit: Payer: Self-pay

## 2020-01-03 ENCOUNTER — Encounter: Payer: Self-pay | Admitting: Cardiology

## 2020-01-03 VITALS — BP 160/80 | HR 73 | Ht 71.5 in | Wt 215.6 lb

## 2020-01-03 DIAGNOSIS — I4892 Unspecified atrial flutter: Secondary | ICD-10-CM

## 2020-01-03 MED ORDER — APIXABAN 5 MG PO TABS: 5.0000 mg | ORAL_TABLET | Freq: Two times a day (BID) | ORAL | 0 refills | Status: DC

## 2020-01-03 NOTE — Progress Notes (Signed)
Electrophysiology Office Follow up Visit Note:    Date:  01/03/2020   ID:  Francisco Larson, DOB [birth year], MRN [id number]  PCP:  Janith Lima, MD  [place] Cardiologist:  No primary care provider on file.  [facility] Electrophysiologist:  Vickie Epley, MD    Interval History:    Francisco Larson is a 84 y.o. male who presents for a follow up visit.  I last saw him in clinic on December 27, 2019 for his atrial flutter.  Today he is presenting for loop recorder implant as part of an ongoing surveillance plan for recurrent atrial fibrillation or flutter after recent cardioversion on December 16, 2019.  He is hoping to pursue a treatment regimen that does not include long-term anticoagulation given the risks of bleeding.    Past Medical History:  Diagnosis Date  . Arthritis   . Hyperlipidemia   . Leukemia, chronic lymphocytic ([place])   . Shingles   . Vitamin D deficiency     Past Surgical History:  Procedure Laterality Date  . CARDIOVERSION N/A 12/16/2019   Procedure: CARDIOVERSION;  Surgeon: Sanda Klein, MD;  Location: [facility];  Service: Cardiovascular;  Laterality: N/A;  . EYE SURGERY    . LAPAROSCOPIC TRANS ANAL AND TRANSABDOMINAL RECTAL RESECTION WITH COLOANAL ANASTOMOSIS    . SURGERY OF LIP     tumor  . TONSILLECTOMY    . VASECTOMY      Current Medications: Current Meds  Medication Sig  . atorvastatin (LIPITOR) 20 MG tablet Take 1 tablet (20 mg total) by mouth daily. Must keep upcoming appt for future refills  . betamethasone dipropionate 0.05 % cream Apply 1 application topically daily as needed (Dryness on Hand).   . Multiple Vitamins-Minerals (CENTRUM SILVER PO) Take 1 tablet by mouth daily.   . [DISCONTINUED] apixaban (ELIQUIS) 5 MG TABS tablet Take 1 tablet (5 mg total) by mouth 2 (two) times daily.     Allergies:   Azithromycin   Social History   Socioeconomic History  . Marital status: Married    Spouse name: Not on file  .  Number of children: 2  . Years of education: Not on file  . Highest education level: Not on file  Occupational History  . Occupation: Retired    Comment: Professor [employer]  Tobacco Use  . Smoking status: Never Smoker  . Smokeless tobacco: Never Used  Vaping Use  . Vaping Use: Never used  Substance and Sexual Activity  . Alcohol use: No  . Drug use: No  . Sexual activity: Not on file  Other Topics Concern  . Not on file  Social History Narrative  . Not on file   Social Determinants of Health   Financial Resource Strain: Not on file  Food Insecurity: Not on file  Transportation Needs: Not on file  Physical Activity: Not on file  Stress: Not on file  Social Connections: Not on file     Family History: The patient's family history includes Asthma in his sister; COPD in his maternal grandmother; Cancer in his mother; Colon cancer in his sister; Diabetes in his paternal grandmother; Emphysema in his mother; Heart disease in his father; Hyperlipidemia in his sister; Mental illness in his paternal grandmother; Stroke in his maternal grandfather and sister.  ROS:   Please see the history of present illness.    All other systems reviewed and are negative.  EKGs/Labs/Other Studies Reviewed:    The following studies were reviewed today: Prior notes  Recent Labs: 03/28/2019: TSH 5.91 11/14/2019: ALT 21; Platelet Count 158 12/16/2019: BUN 16; Creatinine, Ser 1.00; Hemoglobin 16.3; Potassium 4.6; Sodium 139  Recent Lipid Panel    Component Value Date/Time   CHOL 120 03/28/2019 0858   CHOL 136 02/17/2017 0832   TRIG 89.0 03/28/2019 0858   HDL 26.80 (L) 03/28/2019 0858   HDL 29 (L) 02/17/2017 0832   CHOLHDL 4 03/28/2019 0858   VLDL 17.8 03/28/2019 0858   LDLCALC 75 03/28/2019 0858   LDLCALC 82 02/17/2017 0832    Physical Exam:    VS:  BP (!) 160/80   Pulse 73   Ht 5' 11.5" (1.816 m)   Wt 215 lb 9.6 oz (97.8 kg)   SpO2 96%   BMI 29.65 kg/m     Wt Readings from Last 3  Encounters:  01/03/20 215 lb 9.6 oz (97.8 kg)  12/27/19 216 lb (98 kg)  12/16/19 215 lb (97.5 kg)     GEN: Well nourished, well developed in no acute distress HEENT: Normal NECK: No JVD; No carotid bruits LYMPHATICS: No lymphadenopathy CARDIAC: RRR, no murmurs, rubs, gallops RESPIRATORY:  Clear to auscultation without rales, wheezing or rhonchi  ABDOMEN: Soft, non-tender, non-distended MUSCULOSKELETAL:  No edema; No deformity  SKIN: Warm and dry NEUROLOGIC:  Alert and oriented x 3 PSYCHIATRIC:  Normal affect   ASSESSMENT:    1. Atrial flutter, unspecified type ([place])    PLAN:    In order of problems listed above:  1. Atrial flutter Post cardioversion on December 16, 2019.  Has not had a recurrent episode of atrial flutter or atrial fibrillation after cardioversion.  Given his elevated CHA2DS2-VASc, we will plan on anticoagulation through the month of December.  Given the patient is unable to tell when he is in atrial flutter, I think is a reasonable strategy to pursue loop recorder implant for ongoing surveillance of recurrent atrial arrhythmias.  I discussed the procedure in detail with the patient again during today's visit including the risks and expected recovery time.  He wishes to proceed with loop recorder implant.  I have asked him to continue his anticoagulant until he runs out of his supply at the end of this month.  At the point he is elected to stop his anticoagulant and use the loop recorder for ongoing surveillance for atrial fibrillation or flutter recurrence.  If he were to have a recurrence of atrial flutter or fibrillation, would plan to restart his anticoagulant and discuss antiarrhythmic therapy versus ablation.   Medication Adjustments/Labs and Tests Ordered: Current medicines are reviewed at length with the patient today.  Concerns regarding medicines are outlined above.  No orders of the defined types were placed in this encounter.  Meds ordered this encounter   Medications  . apixaban (ELIQUIS) 5 MG TABS tablet    Sig: Take 1 tablet (5 mg total) by mouth 2 (two) times daily for 10 days.    Dispense:  20 tablet    Refill:  0     Signed, Lars Mage, MD, [facility]  01/03/2020 4:25 PM    Electrophysiology [facility]       -----------------------------------------------------------------------------------  SURGEON:  Lars Mage, MD    PREPROCEDURE DIAGNOSIS:  Atrial flutter    POSTPROCEDURE DIAGNOSIS:  Atrial flutter     PROCEDURES:   1. Implantable loop recorder implantation    INTRODUCTION:  Francisco Larson is a 84 y.o. male with a history of palpitations and atrial flutter who presents today for  implantable loop implantation.  The patient has been cardioverted out of atrial flutter and would like to use the loop recorder for ongoing surveillance for atrial arrhythmias given his desire to stop his anticoagulant.       DESCRIPTION OF PROCEDURE:  Informed written consent was obtained.  The patient required no sedation for the procedure today.  Mapping over the patient's chest was performed to identify the area where electrograms were most prominent for ILR recording.  This area was found to be the left parasternal region over the 3rd-4th intercostal space. The patients left chest was therefore prepped and draped in the usual sterile fashion. The skin overlying the left parasternal region was infiltrated with lidocaine for local analgesia.  A 0.5-cm incision was made over the left parasternal region over the 3rd intercostal space.  A subcutaneous ILR pocket was fashioned using a combination of sharp and blunt dissection.  A Medtronic Reveal Linq model [id number] (serial number [id number]) implantable loop recorder was then placed into the pocket  R waves were very prominent and measured >0.25mV.  Steri- Strips and a sterile dressing were then applied.  There were no early apparent complications.     CONCLUSIONS:   1.  Successful implantation of a Medtronic Reveal LINQ implantable loop recorder for palpitations and recurrent symptoms of atrial fibrillation  2. No early apparent complications.   Lars Mage, MD 01/03/2020 6:13 PM

## 2020-01-03 NOTE — Patient Instructions (Addendum)
Medication Instructions:  Your physician has recommended you make the following change in your medication:  1.  On January 13, 2020 you will STOP taking your ELIQUIS  Labwork: None ordered.  Testing/Procedures: None ordered.  Follow-Up:  Your physician wants you to follow-up in: 6 months with Dr. Quentin Ore.  You will receive a reminder letter in the mail two months in advance. If you don't receive a letter, please call our office to schedule the follow-up appointment.    Implantable Loop Recorder Placement, Care After This sheet gives you information about how to care for yourself after your procedure. Your health care provider may also give you more specific instructions. If you have problems or questions, contact your health care provider. What can I expect after the procedure? After the procedure, it is common to have:  Soreness or discomfort near the incision.  Some swelling or bruising near the incision.  Follow these instructions at home: Incision care  1.  Leave your outer dressing on for 72 hours.  After 72 hours you can remove your outer dressing and shower. 2. Leave adhesive strips in place. These skin closures may need to stay in place for 1-2 weeks. If adhesive strip edges start to loosen and curl up, you may trim the loose edges.  You may remove the strips if they have not fallen off after 2 weeks. 3. Check your incision area every day for signs of infection. Check for: a. Redness, swelling, or pain. b. Fluid or blood. c. Warmth. d. Pus or a bad smell. 4. Do not take baths, swim, or use a hot tub until your incision is completely healed. 5. If your wound site starts to bleed apply pressure.      If you have any questions/concerns please call the device clinic at [phone].  Activity  Return to your normal activities.  General instructions  Follow instructions from your health care provider about how to manage your implantable loop recorder and transmit  the information. Learn how to activate a recording if this is necessary for your type of device.  Do not go through a metal detection gate, and do not let someone hold a metal detector over your chest. Show your ID card.  Do not have an MRI unless you check with your health care provider first.  Take over-the-counter and prescription medicines only as told by your health care provider.  Keep all follow-up visits as told by your health care provider. This is important. Contact a health care provider if:  You have redness, swelling, or pain around your incision.  You have a fever.  You have pain that is not relieved by your pain medicine.  You have triggered your device because of fainting (syncope) or because of a heartbeat that feels like it is racing, slow, fluttering, or skipping (palpitations). Get help right away if you have:  Chest pain.  Difficulty breathing. Summary  After the procedure, it is common to have soreness or discomfort near the incision.  Change your dressing as told by your health care provider.  Follow instructions from your health care provider about how to manage your implantable loop recorder and transmit the information.  Keep all follow-up visits as told by your health care provider. This is important. This information is not intended to replace advice given to you by your health care provider. Make sure you discuss any questions you have with your health care provider. Document Released: 12/11/2014 Document Revised: 02/14/2017 Document Reviewed: 02/14/2017 Elsevier Patient Education  2020 Elsevier Inc.   

## 2020-01-25 ENCOUNTER — Other Ambulatory Visit: Payer: Self-pay | Admitting: Cardiovascular Disease

## 2020-02-07 ENCOUNTER — Ambulatory Visit (INDEPENDENT_AMBULATORY_CARE_PROVIDER_SITE_OTHER): Payer: Medicare PPO

## 2020-02-07 DIAGNOSIS — I4892 Unspecified atrial flutter: Secondary | ICD-10-CM

## 2020-02-07 LAB — CUP PACEART REMOTE DEVICE CHECK
Date Time Interrogation Session: 20220124212310
Implantable Pulse Generator Implant Date: 20211221

## 2020-02-18 NOTE — Progress Notes (Signed)
Carelink Summary Report / Loop Recorder 

## 2020-02-22 ENCOUNTER — Other Ambulatory Visit: Payer: Self-pay

## 2020-02-22 ENCOUNTER — Ambulatory Visit: Payer: Medicare PPO | Admitting: Podiatry

## 2020-02-22 ENCOUNTER — Encounter: Payer: Self-pay | Admitting: Podiatry

## 2020-02-22 ENCOUNTER — Ambulatory Visit (INDEPENDENT_AMBULATORY_CARE_PROVIDER_SITE_OTHER): Payer: Medicare PPO

## 2020-02-22 DIAGNOSIS — B351 Tinea unguium: Secondary | ICD-10-CM | POA: Diagnosis not present

## 2020-02-22 DIAGNOSIS — M79672 Pain in left foot: Secondary | ICD-10-CM | POA: Diagnosis not present

## 2020-02-22 DIAGNOSIS — M722 Plantar fascial fibromatosis: Secondary | ICD-10-CM | POA: Diagnosis not present

## 2020-02-22 DIAGNOSIS — M79675 Pain in left toe(s): Secondary | ICD-10-CM | POA: Diagnosis not present

## 2020-02-22 DIAGNOSIS — M79674 Pain in right toe(s): Secondary | ICD-10-CM | POA: Diagnosis not present

## 2020-02-22 NOTE — Patient Instructions (Signed)
Plantar Fasciitis  Plantar fasciitis is a painful foot condition that affects the heel. It occurs when the band of tissue that connects the toes to the heel bone (plantar fascia) becomes irritated. This can happen as the result of exercising too much or doing other repetitive activities (overuse injury). Plantar fasciitis can cause mild irritation to severe pain that makes it difficult to walk or move. The pain is usually worse in the morning after sleeping, or after sitting or lying down for a period of time. Pain may also be worse after long periods of walking or standing. What are the causes? This condition may be caused by:  Standing for long periods of time.  Wearing shoes that do not have good arch support.  Doing activities that put stress on joints (high-impact activities). This includes ballet and exercise that makes your heart beat faster (aerobic exercise), such as running.  Being overweight.  An abnormal way of walking (gait).  Tight muscles in the back of your lower leg (calf).  High arches in your feet or flat feet.  Starting a new athletic activity. What are the signs or symptoms? The main symptom of this condition is heel pain. Pain may get worse after the following:  Taking the first steps after a time of rest, especially in the morning after awakening, or after you have been sitting or lying down for a while.  Long periods of standing still. Pain may decrease after 30-45 minutes of activity, such as gentle walking. How is this diagnosed? This condition may be diagnosed based on your medical history, a physical exam, and your symptoms. Your health care provider will check for:  A tender area on the bottom of your foot.  A high arch in your foot or flat feet.  Pain when you move your foot.  Difficulty moving your foot. You may have imaging tests to confirm the diagnosis, such as:  X-rays.  Ultrasound.  MRI. How is this treated? Treatment for plantar  fasciitis depends on how severe your condition is. Treatment may include:  Rest, ice, pressure (compression), and raising (elevating) the affected foot. This is called RICE therapy. Your health care provider may recommend RICE therapy along with over-the-counter pain medicines to manage your pain.  Exercises to stretch your calves and your plantar fascia.  A splint that holds your foot in a stretched, upward position while you sleep (night splint).  Physical therapy to relieve symptoms and prevent problems in the future.  Injections of steroid medicine (cortisone) to relieve pain and inflammation.  Stimulating your plantar fascia with electrical impulses (extracorporeal shock wave therapy). This is usually the last treatment option before surgery.  Surgery, if other treatments have not worked after 12 months. Follow these instructions at home: Managing pain, stiffness, and swelling  If directed, put ice on the painful area. To do this: ? Put ice in a plastic bag, or use a frozen bottle of water. ? Place a towel between your skin and the bag or bottle. ? Roll the bottom of your foot over the bag or bottle. ? Do this for 20 minutes, 2-3 times a day.  Wear athletic shoes that have air-sole or gel-sole cushions, or try soft shoe inserts that are designed for plantar fasciitis.  Elevate your foot above the level of your heart while you are sitting or lying down.   Activity  Avoid activities that cause pain. Ask your health care provider what activities are safe for you.  Do physical therapy exercises   and stretches as told by your health care provider.  Try activities and forms of exercise that are easier on your joints (low impact). Examples include swimming, water aerobics, and biking. General instructions  Take over-the-counter and prescription medicines only as told by your health care provider.  Wear a night splint while sleeping, if told by your health care provider. Loosen the  splint if your toes tingle, become numb, or turn cold and blue.  Maintain a healthy weight, or work with your health care provider to lose weight as needed.  Keep all follow-up visits. This is important. Contact a health care provider if you have:  Symptoms that do not go away with home treatment.  Pain that gets worse.  Pain that affects your ability to move or do daily activities. Summary  Plantar fasciitis is a painful foot condition that affects the heel. It occurs when the band of tissue that connects the toes to the heel bone (plantar fascia) becomes irritated.  Heel pain is the main symptom of this condition. It may get worse after exercising too much or standing still for a long time.  Treatment varies, but it usually starts with rest, ice, pressure (compression), and raising (elevating) the affected foot. This is called RICE therapy. Over-the-counter medicines can also be used to manage pain. This information is not intended to replace advice given to you by your health care provider. Make sure you discuss any questions you have with your health care provider. Document Revised: 04/18/2019 Document Reviewed: 04/18/2019 Elsevier Patient Education  2021 Elsevier Inc.  

## 2020-02-23 ENCOUNTER — Encounter: Payer: Self-pay | Admitting: Gastroenterology

## 2020-02-23 NOTE — Progress Notes (Signed)
Subjective: Francisco Hausen presents today for follow up of at risk foot care. Patient has h/o CKD stage 3 and painful mycotic nails b/l that are difficult to trim. Pain interferes with ambulation. Aggravating factors include wearing enclosed shoe gear. Pain is relieved with periodic professional debridement.   He states has been experiencing heel pain in the left foot for the past several weeks. He denies any h/o trauma.  He walks for exercise and notices pain after exercising graded as 9/10 and after periods of rest graded as 7-8/10.  He has worn his Skechers shoes today, but states he has another pair at home.  Allergies  Allergen Reactions  . Azithromycin Other (See Comments) and Itching    Pt stated he thinks this made him dizzy Pt stated he thinks this made him dizzy    Objective: There were no vitals filed for this visit.  Pt is a pleasant 85 y.o. year old male  in NAD. AAO x 3.   Vascular Examination:  Capillary fill time to digits <3 seconds b/l. Palpable DP pulses b/l. Palpable PT pulses b/l. Pedal hair present b/l. Skin temperature gradient within normal limits b/l.  Dermatological Examination: Pedal skin with normal turgor, texture and tone bilaterally. No open wounds bilaterally. No interdigital macerations bilaterally. Toenails L hallux, L 2nd toe, R hallux and R 2nd toe elongated, dystrophic, thickened, and crumbly with subungual debris and tenderness to dorsal palpation.  Musculoskeletal: Normal muscle strength 5/5 to all lower extremity muscle groups bilaterally. No pain crepitus or joint limitation noted with ROM b/l. No gross bony deformities bilaterally. Pain noted on palpation medial tubercle left heel.  Neurological: Protective sensation intact 5/5 intact bilaterally with 10g monofilament b/l Vibratory sensation intact b/l.  Xray findings left foot: Normal bone mineralization noted LLE. No gas in tissues left lower extremity. Plantar calcaneal spur noted left  lower extremity. No evidence of fracture left lower extremity. No foreign body evident left lower extremity.  Assessment: 1. Pain due to onychomycosis of toenails of both feet   2. Plantar fasciitis, left   3. Pain in left foot    Plan: -Examined patient. -Discussed plantar fasciitis etiology, coPrepped area with alcohol. Injection consisting of 1 cc Celestone Soluspan and 1cc 0.5% marcaine plain administered to left heel.urse and treatment options available. We will start with steroid injection, plantar fascial stretching and plantar fascial brace. Dispensed written instructions on stretching exercises. -Also recommended shoe gear change as his current pair of shoes are worn. He has a new pair of Skechers at home and he will start wearing those. -Toenails L hallux, L 2nd toe, R hallux and R 2nd toe debrided in length and girth without iatrogenic bleeding with sterile nail nipper and dremel.  -Patient to report any pedal injuries to medical professional immediately. -Patient/POA to call should there be question/concern in the interim.  Return in about 1 month (around 03/21/2020) for follow up plantar fasciitis.

## 2020-02-28 NOTE — Progress Notes (Signed)
Patient ID: Francisco Larson, male   DOB: 12/09/1935, 85 y.o.   MRN: [id number]     Cardiology Office Note   Date:  03/06/2020   ID:  Francisco Larson, DOB Mar 06, 1935, MRN [id number]  PCP:  Janith Lima, MD  Cardiologist:   Jenkins Rouge, MD   No chief complaint on file.     History of Present Illness: 85 y.o. history of PAC;s , CAD by calcium scoring 285 in 2016 HLD and CLL.  WBC typically in 25 range but no Rx needed since 2003.  Strong family history of premature CAD. Very active goes to [employer]. Some arthritis in left knee limits activity Has had chronic ACL tear And sees Alusio   Golfing once/week with large group He taught sport and society at [employer] and knew [name] who I took a course with    Calcium Score:  03/13/14 IMPRESSION: Coronary calcium score of 285. This was 71 st percentile for age and sex matched control.  Duplex 08/25/17 plaque no stenosis   Son lives in [place] does rafting tours Recently remarried  Daughter lives in [place] just has twin boys and a new baby boy   Had atrial flutter in October Seen by Dr Quentin Ore [facility] on 12/16/19 with Dr Sallyanne Kuster Plan was to stop anticoagulation at end of month and monitor for any recurrence With ILR. Thought to be at increased risk of bleeding due to age and CLL  Past Medical History:  Diagnosis Date  . Arthritis   . Hyperlipidemia   . Leukemia, chronic lymphocytic ([place])   . Shingles   . Vitamin D deficiency     Past Surgical History:  Procedure Laterality Date  . CARDIOVERSION N/A 12/16/2019   Procedure: CARDIOVERSION;  Surgeon: Sanda Klein, MD;  Location: [facility] END[facility]COPY;  Service: Cardiovascular;  Laterality: N/A;  . EYE SURGERY    . LAPAROSCOPIC TRANS ANAL AND TRANSABDOMINAL RECTAL RESECTION WITH COLOANAL ANASTOMOSIS    . SURGERY OF LIP     tumor  . TONSILLECTOMY    . VASECTOMY       Current Outpatient Medications  Medication Sig Dispense Refill  .  atorvastatin (LIPITOR) 20 MG tablet TAKE 1 TABLET(20 MG) BY MOUTH DAILY 90 tablet 1  . betamethasone dipropionate 0.05 % cream Apply 1 application topically daily as needed (Dryness on Hand).     . Multiple Vitamins-Minerals (CENTRUM SILVER PO) Take 1 tablet by mouth daily.      No current facility-administered medications for this visit.    Allergies:   Azithromycin    Social History:  The patient  reports that he has never smoked. He has never used smokeless tobacco. He reports that he does not drink alcohol and does not use drugs.   Family History:  The patient's family history includes Asthma in his sister; COPD in his maternal grandmother; Cancer in his mother; Colon cancer in his sister; Diabetes in his paternal grandmother; Emphysema in his mother; Heart disease in his father; Hyperlipidemia in his sister; Mental illness in his paternal grandmother; Stroke in his maternal grandfather and sister.    ROS:  Please see the history of present illness.   Otherwise, review of systems are positive for none.   All other systems are reviewed and negative.    PHYSICAL EXAM: VS:  BP 132/76   Pulse 78   Ht 5' 11.5" (1.816 m)   Wt 96.6 kg   SpO2 98%   BMI  29.29 kg/m  , BMI Body mass index is 29.29 kg/m. Affect appropriate Healthy:  appears stated age 33: normal Neck supple with no adenopathy JVP normal left  bruits no thyromegaly Lungs clear with no wheezing and good diaphragmatic motion Heart:  S1/S2 no murmur, no rub, gallop or click PMI normal Abdomen: benighn, BS positve, no tenderness, no AAA no bruit.  No HSM or HJR Distal pulses intact with no bruits No edema Neuro non-focal Skin warm and dry No muscular weakness     EKG:   08/20/17 SR rate 66 normal 10/13/18 SR rate 79 PAC otherwise normal    Recent Labs: 03/28/2019: TSH 5.91 11/14/2019: ALT 21; Platelet Count 158 12/16/2019: BUN 16; Creatinine, Ser 1.00; Hemoglobin 16.3; Potassium 4.6; Sodium 139    Lipid Panel     Component Value Date/Time   CHOL 120 03/28/2019 0858   CHOL 136 02/17/2017 0832   TRIG 89.0 03/28/2019 0858   HDL 26.80 (L) 03/28/2019 0858   HDL 29 (L) 02/17/2017 0832   CHOLHDL 4 03/28/2019 0858   VLDL 17.8 03/28/2019 0858   LDLCALC 75 03/28/2019 0858   LDLCALC 82 02/17/2017 0832      Wt Readings from Last 3 Encounters:  03/06/20 96.6 kg  01/03/20 97.8 kg  12/27/19 98 kg      Other studies Reviewed: Additional studies/ records that were reviewed today include: records Dr Everlene Farrier and [facility] office notes .    ASSESSMENT AND PLAN:  1. Family History CAD: Active and asymptomatic  Baseline ECG is normal . 2016 Calcium score 285 which is 61st percentile  Stable no agnina observe   2. Chol:  On statin LFTls ok  Lab Results  Component Value Date   LDLCALC 75 03/28/2019     3. CLL  WBC 25.3   Been like that for 6 years.  F/u oncology 4. Left bruit:   08/25/17 Duplex without significant stenosis ASA / Statin    5. Atrial Flutter:  [facility] 12/16/19 with ILR for surveillance anticoagulation d/c by Dr Quentin Ore Will need ablation or AAT if he has recurrence discovered by ILR   Current medicines are reviewed at length with the patient today.  The patient does not have concerns regarding medicines.  The following changes have been made:   None   Labs/ tests ordered today include:  None    No orders of the defined types were placed in this encounter.    Disposition:   FU with  EP 6 months and me in a year     Signed, Jenkins Rouge, MD  03/06/2020 8:31 AM    [facility] [address], [place], [place]  [address] Phone: [phone]; Fax: [phone]

## 2020-03-06 ENCOUNTER — Encounter: Payer: Self-pay | Admitting: Cardiovascular Disease

## 2020-03-06 ENCOUNTER — Ambulatory Visit: Payer: Medicare PPO | Admitting: Cardiovascular Disease

## 2020-03-06 ENCOUNTER — Other Ambulatory Visit: Payer: Self-pay

## 2020-03-06 VITALS — BP 132/76 | HR 78 | Ht 71.5 in | Wt 213.0 lb

## 2020-03-06 DIAGNOSIS — C9110 Chronic lymphocytic leukemia of B-cell type not having achieved remission: Secondary | ICD-10-CM | POA: Diagnosis not present

## 2020-03-06 DIAGNOSIS — I484 Atypical atrial flutter: Secondary | ICD-10-CM | POA: Diagnosis not present

## 2020-03-06 NOTE — Patient Instructions (Signed)
Medication Instructions:  NO CHANGES *If you need a refill on your cardiac medications before your next appointment, please call your pharmacy*   Lab Work: NONE If you have labs (blood work) drawn today and your tests are completely normal, you will receive your results only by: Marland Kitchen MyChart Message (if you have MyChart) OR . A paper copy in the mail If you have any lab test that is abnormal or we need to change your treatment, we will call you to review the results.   Testing/Procedures: NONE   Follow-Up: At [facility], you and your health needs are our priority.  As part of our continuing mission to provide you with exceptional heart care, we have created designated Provider Care Teams.  These Care Teams include your primary Cardiologist (physician) and Advanced Practice Providers (APPs -  Physician Assistants and Nurse Practitioners) who all work together to provide you with the care you need, when you need it.  We recommend signing up for the patient portal called "MyChart".  Sign up information is provided on this After Visit Summary.  MyChart is used to connect with patients for Virtual Visits (Telemedicine).  Patients are able to view lab/test results, encounter notes, upcoming appointments, etc.  Non-urgent messages can be sent to your provider as well.   To learn more about what you can do with MyChart, go to [email or web address].    Your next appointment:   10 month(s)  The format for your next appointment:   In Person  Provider:   Jenkins Rouge, MD   Other Instructions NONE

## 2020-03-09 ENCOUNTER — Ambulatory Visit (INDEPENDENT_AMBULATORY_CARE_PROVIDER_SITE_OTHER): Payer: Medicare PPO

## 2020-03-09 DIAGNOSIS — I484 Atypical atrial flutter: Secondary | ICD-10-CM

## 2020-03-12 LAB — CUP PACEART REMOTE DEVICE CHECK
Date Time Interrogation Session: 20220226212610
Implantable Pulse Generator Implant Date: 20211221

## 2020-03-16 NOTE — Progress Notes (Signed)
Carelink Summary Report / Loop Recorder 

## 2020-03-27 ENCOUNTER — Other Ambulatory Visit: Payer: Self-pay

## 2020-03-27 ENCOUNTER — Ambulatory Visit: Payer: Medicare PPO | Admitting: Podiatry

## 2020-03-27 DIAGNOSIS — M79672 Pain in left foot: Secondary | ICD-10-CM

## 2020-03-27 DIAGNOSIS — M722 Plantar fascial fibromatosis: Secondary | ICD-10-CM

## 2020-03-28 ENCOUNTER — Telehealth: Payer: Self-pay

## 2020-03-28 ENCOUNTER — Encounter: Payer: Self-pay | Admitting: Internal Medicine

## 2020-03-28 ENCOUNTER — Ambulatory Visit (INDEPENDENT_AMBULATORY_CARE_PROVIDER_SITE_OTHER): Payer: Medicare PPO | Admitting: Internal Medicine

## 2020-03-28 VITALS — BP 136/80 | HR 70 | Temp 98.2°F | Resp 16 | Ht 71.5 in | Wt 214.0 lb

## 2020-03-28 DIAGNOSIS — E785 Hyperlipidemia, unspecified: Secondary | ICD-10-CM | POA: Diagnosis not present

## 2020-03-28 DIAGNOSIS — N1831 Chronic kidney disease, stage 3a: Secondary | ICD-10-CM

## 2020-03-28 DIAGNOSIS — R7303 Prediabetes: Secondary | ICD-10-CM | POA: Diagnosis not present

## 2020-03-28 DIAGNOSIS — Z0000 Encounter for general adult medical examination without abnormal findings: Secondary | ICD-10-CM

## 2020-03-28 DIAGNOSIS — E559 Vitamin D deficiency, unspecified: Secondary | ICD-10-CM | POA: Diagnosis not present

## 2020-03-28 DIAGNOSIS — I4819 Other persistent atrial fibrillation: Secondary | ICD-10-CM

## 2020-03-28 DIAGNOSIS — R7989 Other specified abnormal findings of blood chemistry: Secondary | ICD-10-CM

## 2020-03-28 DIAGNOSIS — C9110 Chronic lymphocytic leukemia of B-cell type not having achieved remission: Secondary | ICD-10-CM | POA: Diagnosis not present

## 2020-03-28 DIAGNOSIS — Z23 Encounter for immunization: Secondary | ICD-10-CM | POA: Insufficient documentation

## 2020-03-28 LAB — CBC WITH DIFFERENTIAL/PLATELET
Basophils Absolute: 0.1 10*3/uL (ref 0.0–0.1)
Basophils Relative: 0.3 % (ref 0.0–3.0)
Eosinophils Absolute: 0.2 10*3/uL (ref 0.0–0.7)
Eosinophils Relative: 0.8 % (ref 0.0–5.0)
HCT: 48.4 % (ref 39.0–52.0)
Hemoglobin: 16.3 g/dL (ref 13.0–17.0)
Lymphocytes Relative: 81.9 % — ABNORMAL HIGH (ref 12.0–46.0)
Lymphs Abs: 22 10*3/uL — ABNORMAL HIGH (ref 0.7–4.0)
MCHC: 33.8 g/dL (ref 30.0–36.0)
MCV: 90.8 fl (ref 78.0–100.0)
Monocytes Absolute: 0.8 10*3/uL (ref 0.1–1.0)
Monocytes Relative: 2.8 % — ABNORMAL LOW (ref 3.0–12.0)
Neutro Abs: 3.8 10*3/uL (ref 1.4–7.7)
Neutrophils Relative %: 14.2 % — ABNORMAL LOW (ref 43.0–77.0)
Platelets: 164 10*3/uL (ref 150.0–400.0)
RBC: 5.33 Mil/uL (ref 4.22–5.81)
RDW: 14.1 % (ref 11.5–15.5)
WBC: 26.9 10*3/uL (ref 4.0–10.5)

## 2020-03-28 LAB — LIPID PANEL
Cholesterol: 141 mg/dL (ref 0–200)
HDL: 31.8 mg/dL — ABNORMAL LOW (ref >39.00)
LDL Cholesterol: 82 mg/dL (ref 0–99)
NonHDL: 109.2
Total CHOL/HDL Ratio: 4
Triglycerides: 137 mg/dL (ref 0.0–149.0)
VLDL: 27.4 mg/dL (ref 0.0–40.0)

## 2020-03-28 LAB — TSH: TSH: 6.72 u[IU]/mL — ABNORMAL HIGH (ref 0.35–4.50)

## 2020-03-28 LAB — HEMOGLOBIN A1C: Hgb A1c MFr Bld: 6 % (ref 4.6–6.5)

## 2020-03-28 MED ORDER — SHINGRIX 50 MCG/0.5ML IM SUSR
0.5000 mL | Freq: Once | INTRAMUSCULAR | 1 refills | Status: AC
Start: 2020-03-28 — End: 2020-03-28

## 2020-03-28 NOTE — Patient Instructions (Signed)

## 2020-03-28 NOTE — Telephone Encounter (Signed)
CRITICAL VALUE STICKER  CRITICAL VALUE: WBC 26.9  RECEIVER (on-site recipient of call): Francisco Larson rnc  [place] NOTIFIED: 03/28/20 at 1143  MESSENGER (representative from lab): Santiago Glad  MD NOTIFIED: Dr Ronnald Ramp  TIME OF NOTIFICATION: [id number]  RESPONSE: Awaiting response

## 2020-03-28 NOTE — Progress Notes (Signed)
Subjective:  Patient ID: Francisco Larson, male    DOB: 03/07/1935  Age: 85 y.o. MRN: [id number]  CC: Annual Exam and Hyperlipidemia  This visit occurred during the SARS-CoV-2 public health emergency.  Safety protocols were in place, including screening questions prior to the visit, additional usage of staff PPE, and extensive cleaning of exam room while observing appropriate contact time as indicated for disinfecting solutions.    HPI DEJAN ANGERT presents for a CPX.  He is active and denies any recent episodes of chest pain, shortness of breath, palpitations, edema, or fatigue.  Outpatient Medications Prior to Visit  Medication Sig Dispense Refill  . atorvastatin (LIPITOR) 20 MG tablet TAKE 1 TABLET(20 MG) BY MOUTH DAILY 90 tablet 1  . betamethasone dipropionate 0.05 % cream Apply 1 application topically daily as needed (Dryness on Hand).     . Multiple Vitamins-Minerals (CENTRUM SILVER PO) Take 1 tablet by mouth daily.      No facility-administered medications prior to visit.    ROS Review of Systems  Constitutional: Negative.  Negative for diaphoresis, fatigue and unexpected weight change.  HENT: Negative.   Eyes: Negative for visual disturbance.  Respiratory: Negative for cough, chest tightness, shortness of breath and wheezing.   Cardiovascular: Negative for chest pain, palpitations and leg swelling.  Gastrointestinal: Negative for abdominal pain, constipation, diarrhea, nausea and vomiting.  Endocrine: Negative.  Negative for cold intolerance and heat intolerance.  Genitourinary: Negative.  Negative for difficulty urinating.  Musculoskeletal: Negative for arthralgias and myalgias.  Skin: Negative.  Negative for color change and pallor.  Neurological: Negative.  Negative for dizziness, weakness, light-headedness and numbness.  Hematological: Negative for adenopathy. Does not bruise/bleed easily.  Psychiatric/Behavioral: Negative.     Objective:  BP 136/80    Pulse 70   Temp 98.2 F (36.8 C) (Oral)   Resp 16   Ht 5' 11.5" (1.816 m)   Wt 214 lb (97.1 kg)   SpO2 98%   BMI 29.43 kg/m   BP Readings from Last 3 Encounters:  03/28/20 136/80  03/06/20 132/76  01/03/20 (!) 160/80    Wt Readings from Last 3 Encounters:  03/28/20 214 lb (97.1 kg)  03/06/20 213 lb (96.6 kg)  01/03/20 215 lb 9.6 oz (97.8 kg)    Physical Exam Vitals reviewed.  HENT:     Nose: Nose normal.     Mouth/Throat:     Mouth: Mucous membranes are moist.  Eyes:     General: No scleral icterus.    Conjunctiva/sclera: Conjunctivae normal.  Cardiovascular:     Rate and Rhythm: Normal rate and regular rhythm.     Heart sounds: Murmur heard.   Systolic murmur is present with a grade of 1/6.   Pulmonary:     Effort: Pulmonary effort is normal.     Breath sounds: No stridor. No wheezing, rhonchi or rales.  Abdominal:     General: Abdomen is flat. Bowel sounds are normal. There is no distension.     Palpations: Abdomen is soft. There is no hepatomegaly, splenomegaly or mass.     Tenderness: There is no abdominal tenderness.  Musculoskeletal:        General: Normal range of motion.     Cervical back: Neck supple.     Right lower leg: No edema.     Left lower leg: No edema.  Lymphadenopathy:     Cervical: No cervical adenopathy.  Skin:    General: Skin is warm and dry.  Coloration: Skin is not pale.  Neurological:     General: No focal deficit present.     Mental Status: He is alert.  Psychiatric:        Mood and Affect: Mood normal.        Behavior: Behavior normal.     Lab Results  Component Value Date   WBC 26.9 Repeated and verified X2. (HH) 03/28/2020   HGB 16.3 03/28/2020   HCT 48.4 03/28/2020   PLT 164.0 03/28/2020   GLUCOSE 119 (H) 12/16/2019   CHOL 141 03/28/2020   TRIG 137.0 03/28/2020   HDL 31.80 (L) 03/28/2020   LDLCALC 82 03/28/2020   ALT 21 11/14/2019   AST 23 11/14/2019   NA 139 12/16/2019   K 4.6 12/16/2019   CL 105  12/16/2019   CREATININE 1.00 12/16/2019   BUN 16 12/16/2019   CO2 28 11/14/2019   TSH 6.72 (H) 03/28/2020   PSA 3.38 03/28/2019   HGBA1C 6.0 03/28/2020    No results found.  Assessment & Plan:   Jacen was seen today for annual exam and hyperlipidemia.  Diagnoses and all orders for this visit:  Persistent atrial fibrillation ([place])- He is maintaining sinus rhythm. -     TSH; Future -     TSH  Stage 3a chronic kidney disease ([place])- His renal function is stable. -     Cancel: Basic metabolic panel; Future  Prediabetes- His A1c is at 6.0%.  Medical therapy is not indicated. -     Cancel: Basic metabolic panel; Future -     Hemoglobin A1c; Future -     Hemoglobin A1c  TSH elevation- His TSH is stable and he clinically is euthyroid.  This is subclinical hypothyroidism. -     TSH; Future -     Thyroid peroxidase antibody; Future -     Thyroid peroxidase antibody -     TSH  Vitamin D deficiency  Routine general medical examination at a health care facility- Exam completed, labs reviewed, vaccines reviewed and updated, no cancer screenings are indicated, patient education was given.  CLL (chronic lymphocytic leukemia) ([place])- His white cell count is stable.  His other cell lines are normal.  Will continue to follow. -     CBC with Differential/Platelet; Future -     Cancel: Hepatic function panel; Future -     CBC with Differential/Platelet  Hyperlipidemia LDL goal <130- LDL goal achieved. Doing well on the statin -     Lipid panel; Future -     Cancel: Hepatic function panel; Future -     Lipid panel  Need for shingles vaccine -     Zoster Vaccine Adjuvanted [facility]) injection; Inject 0.5 mLs into the muscle once for 1 dose.   I am having Francisco Larson "Francisco Larson" start on Shingrix. I am also having him maintain his Multiple Vitamins-Minerals (CENTRUM SILVER PO), betamethasone dipropionate, and atorvastatin.  Meds ordered this encounter  Medications  .  Zoster Vaccine Adjuvanted [facility]) injection    Sig: Inject 0.5 mLs into the muscle once for 1 dose.    Dispense:  0.5 mL    Refill:  1     Follow-up: Return in about 6 months (around 09/28/2020).  Scarlette Calico, MD

## 2020-03-29 LAB — THYROID PEROXIDASE ANTIBODY: Thyroperoxidase Ab SerPl-aCnc: 3 IU/mL (ref <9)

## 2020-04-01 ENCOUNTER — Encounter: Payer: Self-pay | Admitting: Podiatry

## 2020-04-01 NOTE — Progress Notes (Signed)
SUBJECTIVE: Francisco Hausen presents to clinic on today for follow up plantar fasciitis of the left foot.. Treatments on last visit include steroid injection of celestone soluspan, stretching, icing and plantar fascial brace. He relates noted improvement   Allergies  Allergen Reactions  . Azithromycin Other (See Comments) and Itching    Pt stated he thinks this made him dizzy Pt stated he thinks this made him dizzy     OBJECTIVE: There were no vitals filed for this visit.  Vascular: Dorsalis pedis and posterior tibial pulses palpable bilaterally. Capillary refill time immediate x 10 digits. No pedal edema b/l. No varicosities b/l. Skin temperature gradient WNL b/l.  Dermatological: Normal skin turgor, texture and tone b/l No skin eruptions noted b/l Nails 1-5 b/l recently debrided b/l.  Neurological: Epicritic sensation grossly intact b/l and symmetrically with 10 gram monofilament. Vibratory sensation intact b/l.  Musculoskeletal: Muscle strength 5/5 to all LE muscle groups b/l Negative Tinel's sign b/l Minimal pain on palpation noted medial tubercle left foot.  ASSESSMENT: 1. Plantar fasciitis left foot 2. Pain in foot left toot  PLAN: 1. Patient examined today. 2. Patient's symptoms improved left foot. 3. Continue stretching, icing and plantar fascial brace daily. 4. Follow up May, 2022, for nail care.

## 2020-04-09 ENCOUNTER — Ambulatory Visit (INDEPENDENT_AMBULATORY_CARE_PROVIDER_SITE_OTHER): Payer: Medicare PPO

## 2020-04-09 DIAGNOSIS — I4819 Other persistent atrial fibrillation: Secondary | ICD-10-CM

## 2020-04-13 LAB — CUP PACEART REMOTE DEVICE CHECK
Date Time Interrogation Session: 20220331213209
Implantable Pulse Generator Implant Date: 20211221

## 2020-04-17 ENCOUNTER — Encounter: Payer: Self-pay | Admitting: Internal Medicine

## 2020-04-23 NOTE — Progress Notes (Signed)
Carelink Summary Report / Loop Recorder 

## 2020-05-16 ENCOUNTER — Ambulatory Visit (INDEPENDENT_AMBULATORY_CARE_PROVIDER_SITE_OTHER): Payer: Medicare PPO

## 2020-05-16 DIAGNOSIS — I4819 Other persistent atrial fibrillation: Secondary | ICD-10-CM | POA: Diagnosis not present

## 2020-05-16 LAB — CUP PACEART REMOTE DEVICE CHECK
Date Time Interrogation Session: 20220503212506
Implantable Pulse Generator Implant Date: 20211221

## 2020-05-21 ENCOUNTER — Other Ambulatory Visit: Payer: Self-pay | Admitting: *Deleted

## 2020-05-21 DIAGNOSIS — C9110 Chronic lymphocytic leukemia of B-cell type not having achieved remission: Secondary | ICD-10-CM

## 2020-05-23 ENCOUNTER — Inpatient Hospital Stay: Payer: Medicare PPO | Attending: Oncology

## 2020-05-23 ENCOUNTER — Other Ambulatory Visit: Payer: Self-pay

## 2020-05-23 DIAGNOSIS — C9110 Chronic lymphocytic leukemia of B-cell type not having achieved remission: Secondary | ICD-10-CM | POA: Insufficient documentation

## 2020-05-23 LAB — CMP (CANCER CENTER ONLY)
ALT: 27 U/L (ref 0–44)
AST: 27 U/L (ref 15–41)
Albumin: 3.3 g/dL — ABNORMAL LOW (ref 3.5–5.0)
Alkaline Phosphatase: 85 U/L (ref 38–126)
Anion gap: 10 (ref 5–15)
BUN: 19 mg/dL (ref 8–23)
CO2: 26 mmol/L (ref 22–32)
Calcium: 9.2 mg/dL (ref 8.9–10.3)
Chloride: 105 mmol/L (ref 98–111)
Creatinine: 1.21 mg/dL (ref 0.61–1.24)
GFR, Estimated: 60 mL/min — ABNORMAL LOW (ref >60)
Glucose, Bld: 92 mg/dL (ref 70–99)
Potassium: 4.4 mmol/L (ref 3.5–5.1)
Sodium: 141 mmol/L (ref 135–145)
Total Bilirubin: 0.7 mg/dL (ref 0.3–1.2)
Total Protein: 6.3 g/dL — ABNORMAL LOW (ref 6.5–8.1)

## 2020-05-23 LAB — CBC WITH DIFFERENTIAL (CANCER CENTER ONLY)
Abs Immature Granulocytes: 0.11 10*3/uL — ABNORMAL HIGH (ref 0.00–0.07)
Basophils Absolute: 0.1 10*3/uL (ref 0.0–0.1)
Basophils Relative: 0 %
Eosinophils Absolute: 0.3 10*3/uL (ref 0.0–0.5)
Eosinophils Relative: 1 %
HCT: 48.1 % (ref 39.0–52.0)
Hemoglobin: 16.1 g/dL (ref 13.0–17.0)
Immature Granulocytes: 0 %
Lymphocytes Relative: 82 %
Lymphs Abs: 21.4 10*3/uL — ABNORMAL HIGH (ref 0.7–4.0)
MCH: 30.8 pg (ref 26.0–34.0)
MCHC: 33.5 g/dL (ref 30.0–36.0)
MCV: 92 fL (ref 80.0–100.0)
Monocytes Absolute: 0.9 10*3/uL (ref 0.1–1.0)
Monocytes Relative: 4 %
Neutro Abs: 3.4 10*3/uL (ref 1.7–7.7)
Neutrophils Relative %: 13 %
Platelet Count: 160 10*3/uL (ref 150–400)
RBC: 5.23 MIL/uL (ref 4.22–5.81)
RDW: 13.2 % (ref 11.5–15.5)
WBC Count: 26.1 10*3/uL — ABNORMAL HIGH (ref 4.0–10.5)
nRBC: 0 % (ref 0.0–0.2)

## 2020-06-05 ENCOUNTER — Telehealth: Payer: Self-pay | Admitting: Internal Medicine

## 2020-06-05 NOTE — Telephone Encounter (Signed)
Patient returned call

## 2020-06-05 NOTE — Telephone Encounter (Signed)
Type of form received:  Resident Medical Information for [facility]   Additional comments:   Received by: [place] R   Form should be Faxed to: [phone]  Form should be mailed to: (original copy) [address], [place], [place], [address]  Is patient requesting call for pickup: No   Form placed in the Provider's box.  *Attach charge sheet.  Provider will determine charge.*  Was patient informed of  7-10 business day turn around (Y/N)? N

## 2020-06-05 NOTE — Telephone Encounter (Addendum)
Forms were received and started.  I have reached out to the patient, Francisco Larson, in regard to a few answers needed to complete the form prior to giving to PCP to sign.

## 2020-06-05 NOTE — Progress Notes (Signed)
Carelink Summary Report / Loop Recorder 

## 2020-06-06 DIAGNOSIS — Z0279 Encounter for issue of other medical certificate: Secondary | ICD-10-CM

## 2020-06-06 NOTE — Telephone Encounter (Signed)
Pt has been informed that forms were completed and faxed back.

## 2020-06-06 NOTE — Telephone Encounter (Signed)
Spoke to pt. He will be bringing a copy of his shingles vaccinations and DNR form to complete this form and update his chart.   Will have PCP sign form for completion after that info has been received.

## 2020-06-06 NOTE — Telephone Encounter (Signed)
Forms signed by PCP.  Faxed back to [facility].  Copy sent to scan  Copy given to charge.  Original has been filed.

## 2020-06-18 ENCOUNTER — Ambulatory Visit (INDEPENDENT_AMBULATORY_CARE_PROVIDER_SITE_OTHER): Payer: Medicare PPO

## 2020-06-18 DIAGNOSIS — I4819 Other persistent atrial fibrillation: Secondary | ICD-10-CM | POA: Diagnosis not present

## 2020-06-18 LAB — CUP PACEART REMOTE DEVICE CHECK
Date Time Interrogation Session: 20220605212838
Implantable Pulse Generator Implant Date: 20211221

## 2020-06-27 ENCOUNTER — Encounter: Payer: Self-pay | Admitting: Podiatry

## 2020-06-27 ENCOUNTER — Other Ambulatory Visit: Payer: Self-pay

## 2020-06-27 ENCOUNTER — Ambulatory Visit: Payer: Medicare PPO | Admitting: Podiatry

## 2020-06-27 DIAGNOSIS — M79675 Pain in left toe(s): Secondary | ICD-10-CM | POA: Diagnosis not present

## 2020-06-27 DIAGNOSIS — B351 Tinea unguium: Secondary | ICD-10-CM | POA: Diagnosis not present

## 2020-06-27 DIAGNOSIS — M79674 Pain in right toe(s): Secondary | ICD-10-CM

## 2020-07-03 NOTE — Progress Notes (Signed)
  Subjective:  Patient ID: Francisco Larson, male    DOB: 1935/02/08,  MRN: [id number]  Francisco Larson presents to clinic today for painful thick toenails that are difficult to trim. Pain interferes with ambulation. Aggravating factors include wearing enclosed shoe gear. Pain is relieved with periodic professional debridement.  He states he and his wife will be moving to [facility] in October.  [name] voices no new pedal problems on today's visit. He states his plantar fasciitis of the left foot has resolved and he is able to walk pain-free.  Allergies  Allergen Reactions   Azithromycin Other (See Comments) and Itching    Pt stated he thinks this made him dizzy Pt stated he thinks this made him dizzy    Review of Systems: Negative except as noted in the HPI. Objective:   Constitutional Francisco Larson is a pleasant 85 y.o. Caucasian male, WD, WN in NAD. AAO x 3.   Vascular Capillary refill time to digits immediate b/l. Palpable pedal pulses b/l LE. Pedal hair sparse. Lower extremity skin temperature gradient within normal limits. No pain with calf compression b/l. No edema noted b/l lower extremities. No cyanosis or clubbing noted.  Neurologic Normal speech. Oriented to person, place, and time. Protective sensation intact 5/5 intact bilaterally with 10g monofilament b/l.  Dermatologic Toenails 1-5 b/l elongated, discolored, dystrophic, thickened, crumbly with subungual debris and tenderness to dorsal palpation.  Orthopedic: Normal muscle strength 5/5 to all lower extremity muscle groups bilaterally. No pain crepitus or joint limitation noted with ROM b/l. No gross bony deformities bilaterally.   Radiographs: None Assessment:  No diagnosis found. Plan:  Patient was evaluated and treated and all questions answered.  Onychomycosis with pain -Nails palliatively debridement as below -Educated on self-care  Procedure: Nail Debridement Rationale: Pain Type of Debridement:  manual, sharp debridement. Instrumentation: Nail nipper, rotary burr. Number of Nails: 10 -Examined patient. -Patient to continue soft, supportive shoe gear daily. -Toenails 1-5 b/l were debrided in length and girth with sterile nail nippers and dremel without iatrogenic bleeding.  -Patient to report any pedal injuries to medical professional immediately. -Patient/POA to call should there be question/concern in the interim.  Return in about 4 months (around 10/27/2020).  Marzetta Board, DPM

## 2020-07-10 NOTE — Progress Notes (Signed)
Carelink Summary Report / Loop Recorder 

## 2020-07-19 ENCOUNTER — Ambulatory Visit (INDEPENDENT_AMBULATORY_CARE_PROVIDER_SITE_OTHER): Payer: Medicare PPO

## 2020-07-19 DIAGNOSIS — I4819 Other persistent atrial fibrillation: Secondary | ICD-10-CM

## 2020-07-22 LAB — CUP PACEART REMOTE DEVICE CHECK
Date Time Interrogation Session: 20220708212758
Implantable Pulse Generator Implant Date: 20211221

## 2020-07-24 ENCOUNTER — Other Ambulatory Visit: Payer: Self-pay | Admitting: Cardiovascular Disease

## 2020-08-07 ENCOUNTER — Ambulatory Visit: Payer: Medicare PPO | Admitting: Dermatology

## 2020-08-08 ENCOUNTER — Ambulatory Visit: Payer: Medicare PPO | Admitting: Dermatology

## 2020-08-08 ENCOUNTER — Encounter: Payer: Self-pay | Admitting: Dermatology

## 2020-08-08 ENCOUNTER — Other Ambulatory Visit: Payer: Self-pay

## 2020-08-08 DIAGNOSIS — Z86007 Personal history of in-situ neoplasm of skin: Secondary | ICD-10-CM | POA: Diagnosis not present

## 2020-08-08 DIAGNOSIS — D044 Carcinoma in situ of skin of scalp and neck: Secondary | ICD-10-CM

## 2020-08-08 DIAGNOSIS — D485 Neoplasm of uncertain behavior of skin: Secondary | ICD-10-CM | POA: Diagnosis not present

## 2020-08-08 DIAGNOSIS — L570 Actinic keratosis: Secondary | ICD-10-CM | POA: Diagnosis not present

## 2020-08-08 DIAGNOSIS — D0439 Carcinoma in situ of skin of other parts of face: Secondary | ICD-10-CM

## 2020-08-08 DIAGNOSIS — C4492 Squamous cell carcinoma of skin, unspecified: Secondary | ICD-10-CM

## 2020-08-08 HISTORY — DX: Squamous cell carcinoma of skin, unspecified: C44.92

## 2020-08-08 NOTE — Patient Instructions (Signed)

## 2020-08-10 NOTE — Progress Notes (Signed)
Carelink Summary Report / Loop Recorder 

## 2020-08-16 ENCOUNTER — Telehealth: Payer: Self-pay | Admitting: *Deleted

## 2020-08-16 NOTE — Telephone Encounter (Signed)
Pathology to patient-surgery appointment scheduled.  

## 2020-08-16 NOTE — Telephone Encounter (Signed)
-----   Message from [name], MD sent at 08/15/2020  8:03 PM EDT ----- Please schedule as a late morning surgery and we'll discuss at that visit if anything is best referred for Mohs.

## 2020-08-20 ENCOUNTER — Ambulatory Visit (INDEPENDENT_AMBULATORY_CARE_PROVIDER_SITE_OTHER): Payer: Medicare PPO

## 2020-08-20 DIAGNOSIS — I4819 Other persistent atrial fibrillation: Secondary | ICD-10-CM | POA: Diagnosis not present

## 2020-08-21 ENCOUNTER — Telehealth: Payer: Self-pay | Admitting: Internal Medicine

## 2020-08-21 NOTE — Telephone Encounter (Signed)
Left message for patient to call me back at [phone] to schedule Medicare Annual Wellness Visit   Last AWV  03/22/18  Please schedule AWVS at anytime with [facility] if patient calls the office back.  84 Minutes appointment   Any questions, please call me at [phone]

## 2020-08-23 LAB — CUP PACEART REMOTE DEVICE CHECK
Date Time Interrogation Session: 20220810212523
Implantable Pulse Generator Implant Date: 20211221

## 2020-08-27 ENCOUNTER — Encounter: Payer: Self-pay | Admitting: Dermatology

## 2020-08-27 NOTE — Progress Notes (Signed)
Follow-Up Visit   Subjective  Francisco Larson is a 85 y.o. male who presents for the following: Skin Problem (Patient is here to have scalped looked at. He has 3 new lesions on his scalp that have grown since last visit. History of non mole skin cancers. ).  Multiple new crusts on head and face.  History of skin cancer. Location:  Duration:  Quality:  Associated Signs/Symptoms: Modifying Factors:  Severity:  Timing: Context:   Objective  Well appearing patient in no apparent distress; mood and affect are within normal limits. Right Anterior Mandible Right jawline 2020: No sign recurrence.  Mid Forehead 1 cm pink crust, rule out superficial carcinoma       Mid Frontal Scalp 1.2 cm thick crust, rule out superficial carcinoma       Mid Parietal Scalp 1 cm thick crust, rule out superficial carcinoma       Mid Supratip of Nose Subtle 7 mm crust, rule out superficial carcinoma       Right Tip of Nose Hornlike 3 mm crust    All skin waist up examined.   Assessment & Plan    History of squamous cell carcinoma in situ (SCCIS) Right Anterior Mandible  Recheck as needed.  Neoplasm of uncertain behavior of skin (4) Mid Forehead  Skin / nail biopsy Type of biopsy: tangential   Informed consent: discussed and consent obtained   Timeout: patient name, date of birth, surgical site, and procedure verified   Procedure prep:  Patient was prepped and draped in usual sterile fashion (Non sterile) Prep type:  Chlorhexidine Anesthesia: the lesion was anesthetized in a standard fashion   Anesthetic:  1% lidocaine w/ epinephrine 1-100,000 local infiltration Instrument used: flexible razor blade   Hemostasis achieved with: ferric subsulfate   Outcome: patient tolerated procedure well   Post-procedure details: sterile dressing applied and wound care instructions given   Dressing type: petrolatum    Specimen 1 - Surgical  pathology Differential Diagnosis: R/O BCC vs SCC  Check Margins: No  Mid Frontal Scalp  Skin / nail biopsy Type of biopsy: tangential   Informed consent: discussed and consent obtained   Timeout: patient name, date of birth, surgical site, and procedure verified   Procedure prep:  Patient was prepped and draped in usual sterile fashion (Non sterile) Prep type:  Chlorhexidine Anesthesia: the lesion was anesthetized in a standard fashion   Anesthetic:  1% lidocaine w/ epinephrine 1-100,000 local infiltration Instrument used: flexible razor blade   Hemostasis achieved with: ferric subsulfate   Outcome: patient tolerated procedure well   Post-procedure details: sterile dressing applied and wound care instructions given   Dressing type: petrolatum    Specimen 2 - Surgical pathology Differential Diagnosis: R/O BCC vs SCC  Check Margins: No  Mid Parietal Scalp  Skin / nail biopsy Type of biopsy: tangential   Informed consent: discussed and consent obtained   Timeout: patient name, date of birth, surgical site, and procedure verified   Procedure prep:  Patient was prepped and draped in usual sterile fashion (Non sterile) Prep type:  Chlorhexidine Anesthesia: the lesion was anesthetized in a standard fashion   Anesthetic:  1% lidocaine w/ epinephrine 1-100,000 local infiltration Instrument used: flexible razor blade   Hemostasis achieved with: ferric subsulfate   Outcome: patient tolerated procedure well   Post-procedure details: sterile dressing applied and wound care instructions given   Dressing type: petrolatum    Specimen 3 - Surgical pathology Differential Diagnosis: R/O  BCC vs SCC  Check Margins: No  Mid Supratip of Nose  Skin / nail biopsy Type of biopsy: tangential   Informed consent: discussed and consent obtained   Timeout: patient name, date of birth, surgical site, and procedure verified   Procedure prep:  Patient was prepped and draped in usual sterile  fashion (Non sterile) Prep type:  Chlorhexidine Anesthesia: the lesion was anesthetized in a standard fashion   Anesthetic:  1% lidocaine w/ epinephrine 1-100,000 local infiltration Instrument used: flexible razor blade   Outcome: patient tolerated procedure well   Post-procedure details: sterile dressing applied and wound care instructions given   Dressing type: bandage and petrolatum    Specimen 4 - Surgical pathology Differential Diagnosis: R/O BCC vs SCC [id number] Check Margins: No  AK (actinic keratosis) Right Tip of Nose  Destruction of lesion - Right Tip of Nose Complexity: simple   Destruction method: cryotherapy   Informed consent: discussed and consent obtained   Timeout:  patient name, date of birth, surgical site, and procedure verified Lesion destroyed using liquid nitrogen: Yes   Cryotherapy cycles:  5 Outcome: patient tolerated procedure well with no complications   Post-procedure details: wound care instructions given        I, [name], MD, have reviewed all documentation for this visit.  The documentation on 08/27/20 for the exam, diagnosis, procedures, and orders are all accurate and complete.

## 2020-09-04 ENCOUNTER — Ambulatory Visit: Payer: Medicare PPO

## 2020-09-13 ENCOUNTER — Encounter: Payer: Self-pay | Admitting: Dermatology

## 2020-09-13 ENCOUNTER — Other Ambulatory Visit: Payer: Self-pay

## 2020-09-13 ENCOUNTER — Ambulatory Visit (INDEPENDENT_AMBULATORY_CARE_PROVIDER_SITE_OTHER): Payer: Medicare PPO | Admitting: Dermatology

## 2020-09-13 DIAGNOSIS — D044 Carcinoma in situ of skin of scalp and neck: Secondary | ICD-10-CM

## 2020-09-13 DIAGNOSIS — D0439 Carcinoma in situ of skin of other parts of face: Secondary | ICD-10-CM | POA: Diagnosis not present

## 2020-09-13 DIAGNOSIS — D099 Carcinoma in situ, unspecified: Secondary | ICD-10-CM

## 2020-09-13 NOTE — Patient Instructions (Signed)

## 2020-09-13 NOTE — Progress Notes (Signed)
Carelink Summary Report / Loop Recorder 

## 2020-09-20 ENCOUNTER — Ambulatory Visit (INDEPENDENT_AMBULATORY_CARE_PROVIDER_SITE_OTHER): Payer: Medicare PPO

## 2020-09-20 DIAGNOSIS — I4819 Other persistent atrial fibrillation: Secondary | ICD-10-CM

## 2020-09-22 ENCOUNTER — Encounter: Payer: Self-pay | Admitting: Dermatology

## 2020-09-22 NOTE — Progress Notes (Signed)
Follow-Up Visit   Subjective  Francisco Larson is a 85 y.o. male who presents for the following: Procedure (Patient here today for treatment of CIS x 3 on mid frontal scalp, mid parietal scalp and mid supratip of nose. ).  Biopsy-proven carcinoma in situ x3 Location:  Duration:  Quality:  Associated Signs/Symptoms: Modifying Factors:  Severity:  Timing: Context:   Objective  Well appearing patient in no apparent distress; mood and affect are within normal limits. Mid Frontal Scalp Lesion identified by Dr.Darlette Dubow and nurse in room.    Mid Parietal Scalp Lesion identified by Dr.Bonne Whack and nurse in room.    Mid Supratip of Nose Lesion identified by Dr.Rangel Echeverri and nurse in room.      A focused examination was performed including head and neck. Relevant physical exam findings are noted in the Assessment and Plan.   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  Destruction of lesion Complexity: simple   Destruction method: electrodesiccation and curettage   Informed consent: discussed and consent obtained   Timeout:  patient name, date of birth, surgical site, and procedure verified Anesthesia: the lesion was anesthetized in a standard fashion   Anesthetic:  1% lidocaine w/ epinephrine 1-100,000 local infiltration Curettage performed in three different directions: Yes     Electrodesiccation performed over the curetted area: No   Curettage cycles:  3 Lesion length (cm):  1 Lesion width (cm):  1 Margin per side (cm):  0 Final wound size (cm):  1 Hemostasis achieved with:  ferric subsulfate Outcome: patient tolerated procedure well with no complications   Post-procedure details: sterile dressing applied and wound care instructions given   Dressing type: petrolatum   Additional details:  Wound innoculated with 5 fluorouracil solution.  Mid Parietal Scalp  Destruction of lesion Complexity: simple   Destruction method:  electrodesiccation and curettage   Informed consent: discussed and consent obtained   Timeout:  patient name, date of birth, surgical site, and procedure verified Anesthesia: the lesion was anesthetized in a standard fashion   Anesthetic:  1% lidocaine w/ epinephrine 1-100,000 local infiltration Curettage performed in three different directions: Yes   Curettage cycles:  3 Lesion length (cm):  1.3 Lesion width (cm):  1.3 Margin per side (cm):  0 Final wound size (cm):  1.3 Hemostasis achieved with:  ferric subsulfate Outcome: patient tolerated procedure well with no complications   Post-procedure details: sterile dressing applied and wound care instructions given   Dressing type: petrolatum   Additional details:  Wound innoculated with 5 fluorouracil solution.  Mid Supratip of Nose  Destruction of lesion Complexity: simple   Destruction method: electrodesiccation and curettage   Informed consent: discussed and consent obtained   Timeout:  patient name, date of birth, surgical site, and procedure verified Anesthesia: the lesion was anesthetized in a standard fashion   Anesthetic:  1% lidocaine w/ epinephrine 1-100,000 local infiltration Curettage performed in three different directions: Yes   Curettage cycles:  3 Lesion length (cm):  1 Lesion width (cm):  1 Margin per side (cm):  0 Final wound size (cm):  1 Hemostasis achieved with:  ferric subsulfate Outcome: patient tolerated procedure well with no complications   Additional details:  Wound innoculated with 5 fluorouracil solution.      I, [name], MD, have reviewed all documentation for this visit.  The documentation on 09/22/20 for the exam, diagnosis, procedures, and orders are all accurate and complete.

## 2020-09-25 LAB — CUP PACEART REMOTE DEVICE CHECK
Date Time Interrogation Session: 20220912212338
Implantable Pulse Generator Implant Date: 20211221

## 2020-09-28 NOTE — Progress Notes (Signed)
Carelink Summary Report / Loop Recorder 

## 2020-10-16 ENCOUNTER — Ambulatory Visit: Payer: Medicare PPO | Admitting: Dermatology

## 2020-10-22 ENCOUNTER — Ambulatory Visit: Payer: Medicare PPO

## 2020-10-29 ENCOUNTER — Ambulatory Visit: Payer: Medicare PPO | Admitting: Podiatry

## 2020-10-29 ENCOUNTER — Encounter: Payer: Self-pay | Admitting: Podiatry

## 2020-10-29 ENCOUNTER — Other Ambulatory Visit: Payer: Self-pay

## 2020-10-29 DIAGNOSIS — B351 Tinea unguium: Secondary | ICD-10-CM | POA: Diagnosis not present

## 2020-10-29 DIAGNOSIS — M79674 Pain in right toe(s): Secondary | ICD-10-CM | POA: Diagnosis not present

## 2020-10-29 DIAGNOSIS — M79675 Pain in left toe(s): Secondary | ICD-10-CM

## 2020-10-31 LAB — CUP PACEART REMOTE DEVICE CHECK
Date Time Interrogation Session: 20221015212317
Implantable Pulse Generator Implant Date: 20211221

## 2020-11-01 ENCOUNTER — Encounter: Payer: Medicare PPO | Admitting: Dermatology

## 2020-11-01 NOTE — Progress Notes (Signed)
Subjective: Francisco Larson is a 85 y.o. male patient seen today for follow up of  painful thick toenails that are difficult to trim. Pain interferes with ambulation. Aggravating factors include wearing enclosed shoe gear. Pain is relieved with periodic professional debridement.  New problems reported today: Patient states his left great toe medial border is sore. Denies any redness, drainage or swelling. He thinks his appointments may be too far apart. He would like to change to every three months instead of every four months.  PCP is Janith Lima, MD. Last visit was: 03/28/2020.  Allergies  Allergen Reactions   Azithromycin Other (See Comments) and Itching    Pt stated he thinks this made him dizzy Pt stated he thinks this made him dizzy    PCP is Janith Lima, MD .  Objective: Physical Exam  General: Patient is a pleasant 85 y.o. Caucasian male WD, WN in NAD. AAO x 3.   Neurovascular Examination: Capillary refill time to digits immediate b/l. Palpable DP pulse(s) b/l lower extremities Palpable PT pulse(s) b/l lower extremities Pedal hair sparse. Lower extremity skin temperature gradient within normal limits. No pain with calf compression b/l. No edema noted b/l lower extremities.  Protective sensation intact 5/5 intact bilaterally with 10g monofilament b/l.  Dermatological:  Pedal skin with normal turgor, texture and tone b/l lower extremities. No open wounds b/l LE. No interdigital macerations b/l lower extremities. Toenails 1-5 b/l elongated, discolored, dystrophic, thickened, crumbly with subungual debris and tenderness to dorsal palpation.  Musculoskeletal:  Normal muscle strength 5/5 to all lower extremity muscle groups bilaterally. No pain crepitus or joint limitation noted with ROM b/l lower extremities. No gross bony deformities b/l lower extremities. Patient ambulates independent of any assistive aids.  Assessment: 1. Pain due to onychomycosis of toenails of both  feet    Plan: Patient was evaluated and treated and all questions answered. Consent given for treatment as described below: -No new findings. No new orders. -Patient to continue soft, supportive shoe gear daily. -Toenails 1-5 b/l were debrided in length and girth with sterile nail nippers and dremel without iatrogenic bleeding.  -Patient to report any pedal injuries to medical professional immediately. -Patient/POA to call should there be question/concern in the interim.  Return in about 3 months (around 01/29/2021).  Marzetta Board, DPM

## 2020-11-14 ENCOUNTER — Other Ambulatory Visit: Payer: Self-pay | Admitting: *Deleted

## 2020-11-14 ENCOUNTER — Other Ambulatory Visit: Payer: Self-pay

## 2020-11-14 DIAGNOSIS — C9110 Chronic lymphocytic leukemia of B-cell type not having achieved remission: Secondary | ICD-10-CM

## 2020-11-15 ENCOUNTER — Other Ambulatory Visit: Payer: Medicare PPO

## 2020-11-15 ENCOUNTER — Other Ambulatory Visit: Payer: Self-pay

## 2020-11-15 ENCOUNTER — Inpatient Hospital Stay: Payer: Medicare PPO | Attending: Oncology

## 2020-11-15 DIAGNOSIS — C9110 Chronic lymphocytic leukemia of B-cell type not having achieved remission: Secondary | ICD-10-CM

## 2020-11-15 DIAGNOSIS — Z856 Personal history of leukemia: Secondary | ICD-10-CM | POA: Insufficient documentation

## 2020-11-15 DIAGNOSIS — Z23 Encounter for immunization: Secondary | ICD-10-CM | POA: Insufficient documentation

## 2020-11-15 LAB — LACTATE DEHYDROGENASE: LDH: 163 U/L (ref 98–192)

## 2020-11-15 LAB — CBC WITH DIFFERENTIAL (CANCER CENTER ONLY)
Abs Immature Granulocytes: 0.03 10*3/uL (ref 0.00–0.07)
Basophils Absolute: 0.1 10*3/uL (ref 0.0–0.1)
Basophils Relative: 0 %
Eosinophils Absolute: 0.2 10*3/uL (ref 0.0–0.5)
Eosinophils Relative: 1 %
HCT: 44.6 % (ref 39.0–52.0)
Hemoglobin: 15.4 g/dL (ref 13.0–17.0)
Immature Granulocytes: 0 %
Lymphocytes Relative: 83 %
Lymphs Abs: 20 10*3/uL — ABNORMAL HIGH (ref 0.7–4.0)
MCH: 30.7 pg (ref 26.0–34.0)
MCHC: 34.5 g/dL (ref 30.0–36.0)
MCV: 88.8 fL (ref 80.0–100.0)
Monocytes Absolute: 0.7 10*3/uL (ref 0.1–1.0)
Monocytes Relative: 3 %
Neutro Abs: 3 10*3/uL (ref 1.7–7.7)
Neutrophils Relative %: 13 %
Platelet Count: 157 10*3/uL (ref 150–400)
RBC: 5.02 MIL/uL (ref 4.22–5.81)
RDW: 13 % (ref 11.5–15.5)
Smear Review: NORMAL
WBC Count: 24.1 10*3/uL — ABNORMAL HIGH (ref 4.0–10.5)
nRBC: 0 % (ref 0.0–0.2)

## 2020-11-15 LAB — CMP (CANCER CENTER ONLY)
ALT: 22 U/L (ref 0–44)
AST: 26 U/L (ref 15–41)
Albumin: 3.1 g/dL — ABNORMAL LOW (ref 3.5–5.0)
Alkaline Phosphatase: 81 U/L (ref 38–126)
Anion gap: 7 (ref 5–15)
BUN: 15 mg/dL (ref 8–23)
CO2: 24 mmol/L (ref 22–32)
Calcium: 8.6 mg/dL — ABNORMAL LOW (ref 8.9–10.3)
Chloride: 108 mmol/L (ref 98–111)
Creatinine: 0.93 mg/dL (ref 0.61–1.24)
GFR, Estimated: >60 mL/min (ref >60)
Glucose, Bld: 115 mg/dL — ABNORMAL HIGH (ref 70–99)
Potassium: 4.4 mmol/L (ref 3.5–5.1)
Sodium: 139 mmol/L (ref 135–145)
Total Bilirubin: 0.7 mg/dL (ref 0.3–1.2)
Total Protein: 5.8 g/dL — ABNORMAL LOW (ref 6.5–8.1)

## 2020-11-16 LAB — BETA 2 MICROGLOBULIN, SERUM: Beta-2 Microglobulin: 2.7 mg/L — ABNORMAL HIGH (ref 0.6–2.4)

## 2020-11-21 ENCOUNTER — Other Ambulatory Visit: Payer: Medicare PPO

## 2020-11-21 NOTE — Progress Notes (Signed)
[facility]  [phone] [phone]    ID: Francisco Larson   DOB: [birth year]  MR#: [id number]  RWE#:[id number]  Patient Care Team: Janith Lima, MD as PCP - General (Internal Medicine) Josue Hector, MD as Consulting Physician (Cardiology) Shyna Duignan, Virgie Dad, MD as Consulting Physician (Oncology) Alphonsa Overall, MD as Consulting Physician (General Surgery) Belva Crome, MD as Consulting Physician (Cardiology) [name], MD as Consulting Physician (Dermatology) Vickie Epley, MD as Consulting Physician (Cardiology) OTHER MD: Nena Jordan MD   INTERVAL HISTORY: Francisco Larson returns today for follow-up of his chronic lymphoid leukemia.  The interval history is generally unremarkable as far as his CLL is concerned and he notes that his white cell count if anything has been decreasing recently.  Lab work up to the last year: Lab Results  Component Value Date   WBC 24.1 (H) 11/15/2020   WBC 26.1 (H) 05/23/2020   WBC 26.9 Repeated and verified X2. (HH) 03/28/2020   WBC 25.3 (H) 11/14/2019   WBC 25.1 Repeated and verified X2. (HH) 03/28/2019   Lymphs Abs 0.7 - 4.0 K/uL 20.7 High   20.8 High   24.3 High      REVIEW OF SYSTEMS: Francisco Larson tells me he and his wife Francisco Larson recently moved to friends homes.  They have a 2 bedroom to bathroom duplex.  Unfortunately in the process of the move she fell and had a concussion.  She is recovering without event however.  He has had no fevers drenching sweats unexplained weight loss or unexplained fatigue, adenopathy, rash, pruritus, or any other symptoms suggestive of active disease.   CO VID 19 VACC NATION STATUS: Status post Pfizer x2  plus booster 09/05/2019    PAST MEDICAL HISTORY: Past Medical History:  Diagnosis Date   Arthritis    Hyperlipidemia    Leukemia, chronic lymphocytic (HCC)    SCCA (squamous cell carcinoma) of skin 08/08/2020   Mid Frontal Scalp (in situ) (curet and 5FU)   SCCA (squamous cell  carcinoma) of skin 08/08/2020   Mid Parietal Scalp (in situ) (curet and 5FU)   SCCA (squamous cell carcinoma) of skin 08/08/2020   Mid Supratip of Nose (in situ)   Shingles    Squamous cell carcinoma of skin 12/08/2018   right jaw line cis   Vitamin D deficiency     PAST SURGICAL HISTORY: Past Surgical History:  Procedure Laterality Date   CARDIOVERSION N/A 12/16/2019   Procedure: CARDIOVERSION;  Surgeon: Sanda Klein, MD;  Location: [facility];  Service: Cardiovascular;  Laterality: N/A;   EYE SURGERY     LAPAROSCOPIC TRANS ANAL AND TRANSABDOMINAL RECTAL RESECTION WITH COLOANAL ANASTOMOSIS     SURGERY OF LIP     tumor   TONSILLECTOMY     VASECTOMY      FAMILY HISTORY Family History  Problem Relation Age of Onset   Colon cancer Sister    Hyperlipidemia Sister    Stroke Sister    Heart disease Father    Emphysema Mother        never smoker, but exposed to 2nd hand from spouse   Cancer Mother    COPD Maternal Grandmother        never smoker   Stroke Maternal Grandfather    Diabetes Paternal Grandmother    Mental illness Paternal Grandmother    Asthma Sister   The patient's mother died at the age of 18. The patient's father died at the age of 69 from heart disease. He  had no brothers. His only sister, currently 56 years old, has a history of late breast cancer and late middle-aged colon cancer.   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 He has been married to Francisco Larson 50+ years, and their children are Francisco Larson lives in [place] and has 4 children, and Francisco Larson who lives in [place] and has no children. He recently became a great-grandfather to two twin girls, who were born in August 2019. 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    ADVANCED DIRECTIVES: in place   HEALTH MAINTENANCE: Social History   Tobacco Use   Smoking status: Never   Smokeless tobacco:  Never  Vaping Use   Vaping Use: Never used  Substance Use Topics   Alcohol use: No   Drug use: No     Colonoscopy:10/18/2014  PSA: 3.13 03/17/2016  Lipid panel:  Allergies  Allergen Reactions   Azithromycin Other (See Comments) and Itching    Pt stated he thinks this made him dizzy Pt stated he thinks this made him dizzy    Current Outpatient Medications  Medication Sig Dispense Refill   atorvastatin (LIPITOR) 20 MG tablet TAKE 1 TABLET(20 MG) BY MOUTH DAILY 90 tablet 1   betamethasone dipropionate 0.05 % cream Apply 1 application topically daily as needed (Dryness on Hand).      Multiple Vitamins-Minerals (CENTRUM SILVER PO) Take 1 tablet by mouth daily.      No current facility-administered medications for this visit.    OBJECTIVE: White man in no acute distress  Vitals:   11/22/20 1159  BP: (!) 161/70  Pulse: 69  Resp: 16  Temp: 97.7 F (36.5 C)  SpO2: 100%      Body mass index is 29.12 kg/m.    ECOG FS: 0  Sclerae unicteric, EOMs intact Wearing a mask No cervical or supraclavicular adenopathy, no axillary or inguinal adenopathy Lungs no rales or rhonchi Heart regular rate and rhythm Abd soft, nontender, positive bowel sounds MSK no focal spinal tenderness, no upper extremity lymphedema Neuro: nonfocal, well oriented, appropriate affect   LABS:  Lab Results  Component Value Date   WBC 24.1 (H) 11/15/2020   NEUTROABS 3.0 11/15/2020   HGB 15.4 11/15/2020   HCT 44.6 11/15/2020   MCV 88.8 11/15/2020   PLT 157 11/15/2020      Chemistry      Component Value Date/Time   NA 139 11/15/2020 0821   NA 137 09/10/2016 1024   K 4.4 11/15/2020 0821   K 4.6 09/10/2016 1024   CL 108 11/15/2020 0821   CL 108 (H) 05/26/2012 0804   CO2 24 11/15/2020 0821   CO2 26 09/10/2016 1024   BUN 15 11/15/2020 0821   BUN 17.3 09/10/2016 1024   CREATININE 0.93 11/15/2020 0821   CREATININE 1.1 09/10/2016 1024      Component Value Date/Time   CALCIUM 8.6 (L)  11/15/2020 0821   CALCIUM 8.9 09/10/2016 1024   ALKPHOS 81 11/15/2020 0821   ALKPHOS 80 09/10/2016 1024   AST 26 11/15/2020 0821   AST 25 09/10/2016 1024   ALT 22 11/15/2020 0821   ALT 23 09/10/2016 1024   BILITOT 0.7 11/15/2020 0821   BILITOT 0.62 09/10/2016 1024      No results found for: LABCA2  No components found for: [id number]  No results for input(s): INR in the last 168 hours.  Urinalysis    Component Value  Date/Time   COLORURINE YELLOW 03/17/2016 6381    STUDIES: CUP PACEART REMOTE DEVICE CHECK  Result Date: 10/31/2020 ILR summary report received. Battery status OK. Normal device function. No new symptom, tachy, brady, or pause episodes. No new AF episodes. Monthly summary reports and ROV/PRN Francisco Breach, Larson, CCDS, [employer]     ASSESSMENT: 85 y.o. [place] man with a history of well-differentiated lymphocytic lymphoma/chronic lymphoid leukemia originally diagnosed April 1997, treated with Rituxan in 2001 and 2004, and not requiring treatment since that time.    PLAN:  Francisco Larson is now 2 25 years out from definitive diagnosis of chronic lymphoid leukemia.  There has been no evidence of progression and no indication for active treatment since 2004.  He maintains an excellent exercise program now using the friend's home gym for cardio.  He received his flu shot here today as he usually does when he sees [place] in November.  At this point again there are no indications for treatment.  He will return in 1 year for routine follow-up.  He knows to call us for any other issue that may develop before then.  Total encounter time 20 minutes.*   Kenwood Rosiak, Virgie Dad, MD  11/22/20 3:49 PM Medical Oncology and Hematology [facility] [place], [place] [address] Tel. [phone]    Fax. [phone]   I, Wilburn Mylar, am acting as scribe for Dr. Virgie Dad. Worth Kober.  I, Lurline Del MD, have reviewed the above documentation for  accuracy and completeness, and I agree with the above.   *Total Encounter Time as defined by the Centers for Medicare and Medicaid Services includes, in addition to the face-to-face time of a patient visit (documented in the note above) non-face-to-face time: obtaining and reviewing outside history, ordering and reviewing medications, tests or procedures, care coordination (communications with other health care professionals or caregivers) and documentation in the medical record.

## 2020-11-22 ENCOUNTER — Other Ambulatory Visit: Payer: Self-pay

## 2020-11-22 ENCOUNTER — Inpatient Hospital Stay: Payer: Medicare PPO | Admitting: Oncology

## 2020-11-22 ENCOUNTER — Inpatient Hospital Stay: Payer: Medicare PPO

## 2020-11-22 VITALS — BP 161/70 | HR 69 | Temp 97.7°F | Resp 16 | Ht 71.0 in | Wt 208.8 lb

## 2020-11-22 DIAGNOSIS — Z23 Encounter for immunization: Secondary | ICD-10-CM

## 2020-11-22 DIAGNOSIS — Z856 Personal history of leukemia: Secondary | ICD-10-CM | POA: Diagnosis not present

## 2020-11-22 DIAGNOSIS — C9110 Chronic lymphocytic leukemia of B-cell type not having achieved remission: Secondary | ICD-10-CM

## 2020-11-22 MED ORDER — INFLUENZA VAC A&B SA ADJ QUAD 0.5 ML IM PRSY
0.5000 mL | PREFILLED_SYRINGE | Freq: Once | INTRAMUSCULAR | Status: AC
  Administered 2020-11-22: 0.5 mL via INTRAMUSCULAR
  Filled 2020-11-22: qty 0.5

## 2020-11-26 NOTE — Progress Notes (Signed)
Patient ID: Francisco Larson, male   DOB: 10/25/1935, 85 y.o.   MRN: [id number]     Cardiology Office Note   Date:  12/03/2020   ID:  KHYAN OATS, DOB 08-21-1935, MRN [id number]  PCP:  Janith Lima, MD  Cardiologist:   Jenkins Rouge, MD   No chief complaint on file.     History of Present Illness: 85 y.o. history of PAC;s , CAD by calcium scoring 285 in 2016 HLD and CLL.  WBC typically in 25 range but no Rx needed since 2003.  Strong family history of premature CAD. Very active goes to [employer] and [employer]. Some arthritis in left knee limits activity Has had chronic ACL tear And sees Alusio   Golfing once/week with large group He taught sport and society at [employer] and knew [name] [place] who I took a course with    Calcium Score:  03/13/14 IMPRESSION: Coronary calcium score of 285. This was 30 st percentile for age and sex matched control.  Duplex 08/25/17 plaque no stenosis   Son lives in [place] does rafting tours Recently remarried  Daughter lives in [place] just has twin boys and a new baby boy   Had atrial flutter in October 2021  Seen by Dr Quentin Ore [facility] on 12/16/19 with Dr Sallyanne Kuster Plan was to stop anticoagulation at end of month and monitor for any recurrence With ILR. Thought to be at increased risk of bleeding due to age and CLL  Moved to [place] with wife Uses their gym for cardio No chest pain    Past Medical History:  Diagnosis Date   Arthritis    Hyperlipidemia    Leukemia, chronic lymphocytic ([place])    SCCA (squamous cell carcinoma) of skin 08/08/2020   Mid Frontal Scalp (in situ) (curet and 5FU)   SCCA (squamous cell carcinoma) of skin 08/08/2020   Mid Parietal Scalp (in situ) (curet and 5FU)   SCCA (squamous cell carcinoma) of skin 08/08/2020   Mid Supratip of Nose (in situ)   Shingles    Squamous cell carcinoma of skin 12/08/2018   right jaw line cis   Vitamin D deficiency     Past Surgical History:  Procedure  Laterality Date   CARDIOVERSION N/A 12/16/2019   Procedure: CARDIOVERSION;  Surgeon: Sanda Klein, MD;  Location: [facility];  Service: Cardiovascular;  Laterality: N/A;   EYE SURGERY     LAPAROSCOPIC TRANS ANAL AND TRANSABDOMINAL RECTAL RESECTION WITH COLOANAL ANASTOMOSIS     SURGERY OF LIP     tumor   TONSILLECTOMY     VASECTOMY       Current Outpatient Medications  Medication Sig Dispense Refill   atorvastatin (LIPITOR) 20 MG tablet TAKE 1 TABLET(20 MG) BY MOUTH DAILY 90 tablet 1   betamethasone dipropionate 0.05 % cream Apply 1 application topically daily as needed (Dryness on Hand).      Multiple Vitamins-Minerals (CENTRUM SILVER PO) Take 1 tablet by mouth daily.      No current facility-administered medications for this visit.    Allergies:   Azithromycin    Social History:  The patient  reports that he has never smoked. He has never used smokeless tobacco. He reports that he does not drink alcohol and does not use drugs.   Family History:  The patient's family history includes Asthma in his sister; COPD in his maternal grandmother; Cancer in his mother; Colon cancer in his sister; Diabetes in his paternal grandmother; Emphysema  in his mother; Heart disease in his father; Hyperlipidemia in his sister; Mental illness in his paternal grandmother; Stroke in his maternal grandfather and sister.    ROS:  Please see the history of present illness.   Otherwise, review of systems are positive for none.   All other systems are reviewed and negative.    PHYSICAL EXAM: VS:  BP 130/80   Pulse 73   Ht 5' 11.5" (1.816 m)   Wt 210 lb (95.3 kg)   SpO2 98%   BMI 28.88 kg/m  , BMI Body mass index is 28.88 kg/m. Affect appropriate Healthy:  appears stated age 55: normal Neck supple with no adenopathy JVP normal left  bruits no thyromegaly Lungs clear with no wheezing and good diaphragmatic motion Heart:  S1/S2 no murmur, no rub, gallop or click PMI normal Abdomen: benighn,  BS positve, no tenderness, no AAA no bruit.  No HSM or HJR Distal pulses intact with no bruits No edema Neuro non-focal Skin warm and dry No muscular weakness     EKG:   08/20/17 SR rate 66 normal 10/13/18 SR rate 79 PAC otherwise normal    Recent Labs: 03/28/2020: TSH 6.72 11/15/2020: ALT 22; BUN 15; Creatinine 0.93; Hemoglobin 15.4; Platelet Count 157; Potassium 4.4; Sodium 139    Lipid Panel    Component Value Date/Time   CHOL 141 03/28/2020 0827   CHOL 136 02/17/2017 0832   TRIG 137.0 03/28/2020 0827   HDL 31.80 (L) 03/28/2020 0827   HDL 29 (L) 02/17/2017 0832   CHOLHDL 4 03/28/2020 0827   VLDL 27.4 03/28/2020 0827   LDLCALC 82 03/28/2020 0827   LDLCALC 82 02/17/2017 0832      Wt Readings from Last 3 Encounters:  12/03/20 210 lb (95.3 kg)  11/22/20 208 lb 12.8 oz (94.7 kg)  03/28/20 214 lb (97.1 kg)      Other studies Reviewed: Additional studies/ records that were reviewed today include: records Dr Everlene Farrier and [facility] office notes .    ASSESSMENT AND PLAN:  1. Family History CAD: Active and asymptomatic  Baseline ECG is normal . 2016 Calcium score 285 which is 61st percentile  Stable no agnina observe   2. Chol:  On statin LFTls ok  Lab Results  Component Value Date   LDLCALC 82 03/28/2020     3. CLL  WBC 24.1 slightly decreased no active Rx f/u oncology Dr Lorenso Courier will take Dr Magrinat's place next visit  4. Left bruit:   08/25/17 Duplex without significant stenosis ASA / Statin   Will updated [place] 5. Atrial Flutter:  Post [facility] 12/16/19 with ILR for surveillance anticoagulation d/c by Dr Quentin Ore Will need ablation or AAT if he has recurrence discovered by ILR   Current medicines are reviewed at length with the patient today.  The patient does not have concerns regarding medicines.  The following changes have been made:   None   Labs/ tests ordered today include:  carotid duplex    Orders Placed This Encounter  Procedures   VAS US CAROTID       Disposition:   FU with  EP 6 months and me in a year     Signed, Jenkins Rouge, MD  12/03/2020 9:26 AM    [facility] [address], [place], [place]  [address] Phone: [phone]; Fax: [phone]

## 2020-11-28 ENCOUNTER — Ambulatory Visit: Payer: Medicare PPO | Admitting: Oncology

## 2020-11-29 ENCOUNTER — Ambulatory Visit (INDEPENDENT_AMBULATORY_CARE_PROVIDER_SITE_OTHER): Payer: Medicare PPO

## 2020-11-29 DIAGNOSIS — I484 Atypical atrial flutter: Secondary | ICD-10-CM | POA: Diagnosis not present

## 2020-11-30 LAB — CUP PACEART REMOTE DEVICE CHECK
Date Time Interrogation Session: 20221117212608
Implantable Pulse Generator Implant Date: 20211221

## 2020-12-03 ENCOUNTER — Ambulatory Visit: Payer: Medicare PPO | Admitting: Cardiovascular Disease

## 2020-12-03 ENCOUNTER — Other Ambulatory Visit: Payer: Self-pay

## 2020-12-03 ENCOUNTER — Encounter: Payer: Self-pay | Admitting: Cardiovascular Disease

## 2020-12-03 VITALS — BP 130/80 | HR 73 | Ht 71.5 in | Wt 210.0 lb

## 2020-12-03 DIAGNOSIS — I480 Paroxysmal atrial fibrillation: Secondary | ICD-10-CM | POA: Diagnosis not present

## 2020-12-03 DIAGNOSIS — R0989 Other specified symptoms and signs involving the circulatory and respiratory systems: Secondary | ICD-10-CM

## 2020-12-03 NOTE — Patient Instructions (Addendum)
Medication Instructions:  The current medical regimen is effective;  continue present plan and medications.  *If you need a refill on your cardiac medications before your next appointment, please call your pharmacy*  Testing/Procedures: Your physician has requested that you have a carotid duplex. This test is an ultrasound of the carotid arteries in your neck. It looks at blood flow through these arteries that supply the brain with blood. Allow one hour for this exam. There are no restrictions or special instructions.  Follow-Up: At [facility], you and your health needs are our priority.  As part of our continuing mission to provide you with exceptional heart care, we have created designated Provider Care Teams.  These Care Teams include your primary Cardiologist (physician) and Advanced Practice Providers (APPs -  Physician Assistants and Nurse Practitioners) who all work together to provide you with the care you need, when you need it.  We recommend signing up for the patient portal called "MyChart".  Sign up information is provided on this After Visit Summary.  MyChart is used to connect with patients for Virtual Visits (Telemedicine).  Patients are able to view lab/test results, encounter notes, upcoming appointments, etc.  Non-urgent messages can be sent to your provider as well.   To learn more about what you can do with MyChart, go to [email or web address].    Your next appointment:   12 month(s)  The format for your next appointment:   In Person  Provider:   Dr Jenkins Rouge  Thank you for choosing [facility]!!

## 2020-12-10 ENCOUNTER — Other Ambulatory Visit: Payer: Self-pay

## 2020-12-10 ENCOUNTER — Ambulatory Visit (HOSPITAL_COMMUNITY)
Admission: RE | Admit: 2020-12-10 | Discharge: 2020-12-10 | Disposition: A | Discharge status: Home / Self Care | Payer: Medicare PPO | Source: Ambulatory Visit | Attending: Internal Medicine | Admitting: Internal Medicine

## 2020-12-10 DIAGNOSIS — R0989 Other specified symptoms and signs involving the circulatory and respiratory systems: Secondary | ICD-10-CM | POA: Diagnosis not present

## 2020-12-10 NOTE — Progress Notes (Signed)
Carelink Summary Report / Loop Recorder 

## 2020-12-26 ENCOUNTER — Ambulatory Visit: Payer: Medicare PPO | Admitting: Dermatology

## 2020-12-26 ENCOUNTER — Other Ambulatory Visit: Payer: Self-pay

## 2020-12-26 ENCOUNTER — Encounter: Payer: Self-pay | Admitting: Dermatology

## 2020-12-26 DIAGNOSIS — R202 Paresthesia of skin: Secondary | ICD-10-CM | POA: Diagnosis not present

## 2020-12-26 DIAGNOSIS — L570 Actinic keratosis: Secondary | ICD-10-CM

## 2020-12-26 DIAGNOSIS — Z8589 Personal history of malignant neoplasm of other organs and systems: Secondary | ICD-10-CM | POA: Diagnosis not present

## 2020-12-26 DIAGNOSIS — H2513 Age-related nuclear cataract, bilateral: Secondary | ICD-10-CM | POA: Diagnosis not present

## 2020-12-26 DIAGNOSIS — H04123 Dry eye syndrome of bilateral lacrimal glands: Secondary | ICD-10-CM | POA: Diagnosis not present

## 2020-12-26 DIAGNOSIS — H5203 Hypermetropia, bilateral: Secondary | ICD-10-CM | POA: Diagnosis not present

## 2020-12-26 DIAGNOSIS — Z85828 Personal history of other malignant neoplasm of skin: Secondary | ICD-10-CM | POA: Diagnosis not present

## 2020-12-26 DIAGNOSIS — H52203 Unspecified astigmatism, bilateral: Secondary | ICD-10-CM | POA: Diagnosis not present

## 2020-12-26 NOTE — Patient Instructions (Signed)
Pick up over the counter CeraVe  pramoxine lotion and apply to back instead of scratching

## 2020-12-31 ENCOUNTER — Other Ambulatory Visit: Payer: Self-pay | Admitting: Internal Medicine

## 2020-12-31 ENCOUNTER — Encounter: Payer: Self-pay | Admitting: Internal Medicine

## 2020-12-31 DIAGNOSIS — U071 COVID-19: Secondary | ICD-10-CM | POA: Insufficient documentation

## 2020-12-31 MED ORDER — NIRMATRELVIR/RITONAVIR (PAXLOVID)TABLET: 3.0000 | ORAL_TABLET | Freq: Two times a day (BID) | ORAL | 0 refills | Status: AC

## 2021-01-01 ENCOUNTER — Telehealth: Payer: Self-pay | Admitting: Internal Medicine

## 2021-01-01 ENCOUNTER — Ambulatory Visit (INDEPENDENT_AMBULATORY_CARE_PROVIDER_SITE_OTHER): Payer: Medicare PPO

## 2021-01-01 DIAGNOSIS — I484 Atypical atrial flutter: Secondary | ICD-10-CM | POA: Diagnosis not present

## 2021-01-01 NOTE — Telephone Encounter (Signed)
Caller connected to [facility] 12.17.2022.   Caller states that he tested positive for COVID by home test. Sx started either last week or last night. Cough and nasal congestion for a week, last night chills but did not measure temp. Headache.    Advised to call PCP within 24 hours.

## 2021-01-02 LAB — CUP PACEART REMOTE DEVICE CHECK
Date Time Interrogation Session: 20221220212729
Implantable Pulse Generator Implant Date: 20211221

## 2021-01-10 NOTE — Progress Notes (Signed)
Carelink Summary Report / Loop Recorder 

## 2021-01-21 ENCOUNTER — Encounter: Payer: Self-pay | Admitting: Dermatology

## 2021-01-21 NOTE — Progress Notes (Signed)
° °  Follow-Up Visit   Subjective  Francisco Larson is a 86 y.o. male who presents for the following: Follow-up (No new concerns, scalp healed good from sept. Surgery ).  History of carcinoma in situ on scalp treated 3 months ago, check other areas.  Itching on back. Location:  Duration:  Quality:  Associated Signs/Symptoms: Modifying Factors:  Severity:  Timing: Context:   Objective  Well appearing patient in no apparent distress; mood and affect are within normal limits. Scalp Scalp healed good smooth to touch, waist up exam.  No other current skin cancer.  Left Antecubital Fossa Hornlike 3 mm white crust, historically stable and not bothersome to patient     Torso - Posterior (Back) Itching without primary rash compatible with notalgia paresthetica    All skin waist up examined.   Assessment & Plan    History of squamous cell carcinoma Scalp  Annual skin examination, sooner as needed clinical change  AK (actinic keratosis) Left Antecubital Fossa  Check as needed change  Notalgia paresthetica Torso - Posterior (Back)  We will look for an over-the-counter anti-itch lotion containing the ingredient pramoxine like CeraVe itch relief      I, [name], MD, have reviewed all documentation for this visit.  The documentation on 01/21/21 for the exam, diagnosis, procedures, and orders are all accurate and complete.

## 2021-02-04 ENCOUNTER — Ambulatory Visit (INDEPENDENT_AMBULATORY_CARE_PROVIDER_SITE_OTHER): Payer: Medicare PPO

## 2021-02-04 DIAGNOSIS — I484 Atypical atrial flutter: Secondary | ICD-10-CM

## 2021-02-04 LAB — CUP PACEART REMOTE DEVICE CHECK
Date Time Interrogation Session: 20230122212848
Implantable Pulse Generator Implant Date: 20211221

## 2021-02-05 ENCOUNTER — Other Ambulatory Visit: Payer: Self-pay

## 2021-02-05 ENCOUNTER — Ambulatory Visit: Payer: Medicare PPO | Admitting: Podiatry

## 2021-02-05 DIAGNOSIS — M79674 Pain in right toe(s): Secondary | ICD-10-CM

## 2021-02-05 DIAGNOSIS — M79675 Pain in left toe(s): Secondary | ICD-10-CM

## 2021-02-05 DIAGNOSIS — B351 Tinea unguium: Secondary | ICD-10-CM

## 2021-02-12 ENCOUNTER — Encounter: Payer: Self-pay | Admitting: Podiatry

## 2021-02-12 NOTE — Progress Notes (Signed)
°  Subjective:  Patient ID: Francisco Larson, male    DOB: 14-Jun-1935,  MRN: [id number]  Francisco Larson presents to clinic today for painful elongated mycotic toenails 1-5 bilaterally which are tender when wearing enclosed shoe gear. Pain is relieved with periodic professional debridement.  New problem(s): None.   PCP is Janith Lima, MD , and last visit was 03/28/2020.  Allergies  Allergen Reactions   Azithromycin Other (See Comments) and Itching    Pt stated he thinks this made him dizzy Pt stated he thinks this made him dizzy    Review of Systems: Negative except as noted in the HPI. Objective:   Constitutional Francisco Larson is a pleasant 86 y.o. Caucasian male, WD, WN in NAD. AAO x 3.   Vascular CFT immediate b/l LE. Palpable DP/PT pulses b/l LE. Digital hair sparse b/l. Skin temperature gradient WNL b/l. No pain with calf compression b/l. No edema noted b/l. No cyanosis or clubbing noted b/l LE.  Neurologic Normal speech. Oriented to person, place, and time. Protective sensation intact 5/5 intact bilaterally with 10g monofilament b/l.  Dermatologic Pedal skin warm and supple b/l.  No open wounds b/l. No interdigital macerations. Toenails 1-5 b/l elongated, thickened, discolored with subungual debris. +Tenderness with dorsal palpation of nailplates. No hyperkeratotic nor porokeratotic lesions noted b/l.  Orthopedic: Muscle strength 5/5 to all lower extremity muscle groups bilaterally. No pain, crepitus or joint limitation noted with ROM bilateral LE. No gross bony deformities bilaterally. Patient ambulates independent of any assistive aids.   Radiographs: None  Last A1c:  Hemoglobin A1C Latest Ref Rng & Units 03/28/2020  HGBA1C 4.6 - 6.5 % 6.0  Some recent data might be hidden   Assessment:   1. Pain due to onychomycosis of toenails of both feet    Plan:  Patient was evaluated and treated and all questions answered. Consent given for treatment as described  below: -Mycotic toenails 1-5 bilaterally were debrided in length and girth with sterile nail nippers and dremel without incident. -Patient/POA to call should there be question/concern in the interim.  Return in about 3 months (around 05/06/2021).  Marzetta Board, DPM

## 2021-02-14 NOTE — Progress Notes (Signed)
Carelink Summary Report / Loop Recorder 

## 2021-03-09 LAB — CUP PACEART REMOTE DEVICE CHECK
Date Time Interrogation Session: 20230224212654
Implantable Pulse Generator Implant Date: 20211221

## 2021-03-11 ENCOUNTER — Ambulatory Visit (INDEPENDENT_AMBULATORY_CARE_PROVIDER_SITE_OTHER): Payer: Medicare PPO

## 2021-03-11 DIAGNOSIS — I4819 Other persistent atrial fibrillation: Secondary | ICD-10-CM | POA: Diagnosis not present

## 2021-03-18 NOTE — Progress Notes (Signed)
Carelink Summary Report / Loop Recorder 

## 2021-04-01 ENCOUNTER — Ambulatory Visit (INDEPENDENT_AMBULATORY_CARE_PROVIDER_SITE_OTHER): Payer: Medicare PPO | Admitting: Internal Medicine

## 2021-04-01 ENCOUNTER — Telehealth: Payer: Self-pay

## 2021-04-01 ENCOUNTER — Encounter: Payer: Self-pay | Admitting: Internal Medicine

## 2021-04-01 ENCOUNTER — Other Ambulatory Visit: Payer: Self-pay

## 2021-04-01 VITALS — BP 126/82 | HR 67 | Temp 98.2°F | Ht 71.0 in | Wt 211.0 lb

## 2021-04-01 DIAGNOSIS — R8281 Pyuria: Secondary | ICD-10-CM

## 2021-04-01 DIAGNOSIS — I4819 Other persistent atrial fibrillation: Secondary | ICD-10-CM | POA: Diagnosis not present

## 2021-04-01 DIAGNOSIS — C9110 Chronic lymphocytic leukemia of B-cell type not having achieved remission: Secondary | ICD-10-CM

## 2021-04-01 DIAGNOSIS — R7303 Prediabetes: Secondary | ICD-10-CM

## 2021-04-01 DIAGNOSIS — R7989 Other specified abnormal findings of blood chemistry: Secondary | ICD-10-CM | POA: Diagnosis not present

## 2021-04-01 DIAGNOSIS — N1831 Chronic kidney disease, stage 3a: Secondary | ICD-10-CM | POA: Diagnosis not present

## 2021-04-01 DIAGNOSIS — Z23 Encounter for immunization: Secondary | ICD-10-CM

## 2021-04-01 DIAGNOSIS — Z0000 Encounter for general adult medical examination without abnormal findings: Secondary | ICD-10-CM

## 2021-04-01 DIAGNOSIS — E785 Hyperlipidemia, unspecified: Secondary | ICD-10-CM

## 2021-04-01 LAB — URINALYSIS, ROUTINE W REFLEX MICROSCOPIC
Bilirubin Urine: NEGATIVE
Hgb urine dipstick: NEGATIVE
Ketones, ur: NEGATIVE
Nitrite: POSITIVE — AB
RBC / HPF: NONE SEEN (ref 0–?)
Specific Gravity, Urine: 1.015 (ref 1.000–1.030)
Total Protein, Urine: NEGATIVE
Urine Glucose: NEGATIVE
Urobilinogen, UA: 0.2 (ref 0.0–1.0)
pH: 7.5 (ref 5.0–8.0)

## 2021-04-01 LAB — HEPATIC FUNCTION PANEL
ALT: 23 U/L (ref 0–53)
AST: 25 U/L (ref 0–37)
Albumin: 3.8 g/dL (ref 3.5–5.2)
Alkaline Phosphatase: 74 U/L (ref 39–117)
Bilirubin, Direct: 0.1 mg/dL (ref 0.0–0.3)
Total Bilirubin: 0.7 mg/dL (ref 0.2–1.2)
Total Protein: 6.2 g/dL (ref 6.0–8.3)

## 2021-04-01 LAB — LIPID PANEL
Cholesterol: 132 mg/dL (ref 0–200)
HDL: 30.2 mg/dL — ABNORMAL LOW (ref >39.00)
LDL Cholesterol: 76 mg/dL (ref 0–99)
NonHDL: 102.29
Total CHOL/HDL Ratio: 4
Triglycerides: 133 mg/dL (ref 0.0–149.0)
VLDL: 26.6 mg/dL (ref 0.0–40.0)

## 2021-04-01 LAB — BASIC METABOLIC PANEL
BUN: 15 mg/dL (ref 6–23)
CO2: 29 mEq/L (ref 19–32)
Calcium: 9.1 mg/dL (ref 8.4–10.5)
Chloride: 103 mEq/L (ref 96–112)
Creatinine, Ser: 1.16 mg/dL (ref 0.40–1.50)
GFR: 58.18 mL/min — ABNORMAL LOW (ref >60.00)
Glucose, Bld: 113 mg/dL — ABNORMAL HIGH (ref 70–99)
Potassium: 4.7 mEq/L (ref 3.5–5.1)
Sodium: 137 mEq/L (ref 135–145)

## 2021-04-01 LAB — CBC WITH DIFFERENTIAL/PLATELET
Basophils Absolute: 0 10*3/uL (ref 0.0–0.1)
Basophils Relative: 0.2 % (ref 0.0–3.0)
Eosinophils Absolute: 0.3 10*3/uL (ref 0.0–0.7)
Eosinophils Relative: 1.1 % (ref 0.0–5.0)
HCT: 48.1 % (ref 39.0–52.0)
Hemoglobin: 16.2 g/dL (ref 13.0–17.0)
Lymphocytes Relative: 79.9 % — ABNORMAL HIGH (ref 12.0–46.0)
Lymphs Abs: 19.4 10*3/uL — ABNORMAL HIGH (ref 0.7–4.0)
MCHC: 33.7 g/dL (ref 30.0–36.0)
MCV: 91.2 fl (ref 78.0–100.0)
Monocytes Absolute: 0.9 10*3/uL (ref 0.1–1.0)
Monocytes Relative: 3.6 % (ref 3.0–12.0)
Neutro Abs: 3.7 10*3/uL (ref 1.4–7.7)
Neutrophils Relative %: 15.2 % — ABNORMAL LOW (ref 43.0–77.0)
Platelets: 166 10*3/uL (ref 150.0–400.0)
RBC: 5.28 Mil/uL (ref 4.22–5.81)
RDW: 13.6 % (ref 11.5–15.5)
WBC: 24.3 10*3/uL (ref 4.0–10.5)

## 2021-04-01 LAB — HEMOGLOBIN A1C: Hgb A1c MFr Bld: 6.1 % (ref 4.6–6.5)

## 2021-04-01 NOTE — Progress Notes (Signed)
? ?Subjective:  ?Patient ID: Francisco Larson, male    DOB: 08/16/1935  Age: 86 y.o. MRN: [id number] ? ?CC: Annual Exam, Atrial Fibrillation, and Hyperlipidemia ? ?This visit occurred during the SARS-CoV-2 public health emergency.  Safety protocols were in place, including screening questions prior to the visit, additional usage of staff PPE, and extensive cleaning of exam room while observing appropriate contact time as indicated for disinfecting solutions.   ? ?HPI ?Francisco Larson Saliva presents for a CPX and f/up - ? ?For at least the last year he has had nocturia and some urinary urgency.  He denies dysuria, hematuria, or frequency.  He exercises on an elliptical and denies chest pain, shortness of breath, palpitations, diaphoresis, dizziness, lightheadedness, near-syncope, or edema. ? ? ?Outpatient Medications Prior to Visit  ?Medication Sig Dispense Refill  ? betamethasone dipropionate 0.05 % cream Apply 1 application topically daily as needed (Dryness on Hand).     ? Multiple Vitamins-Minerals (CENTRUM SILVER PO) Take 1 tablet by mouth daily.     ? atorvastatin (LIPITOR) 20 MG tablet TAKE 1 TABLET(20 MG) BY MOUTH DAILY 90 tablet 1  ? ?No facility-administered medications prior to visit.  ? ? ?ROS ?Review of Systems  ?Constitutional:  Negative for chills, diaphoresis, fatigue and fever.  ?HENT: Negative.    ?Eyes: Negative.   ?Respiratory:  Negative for cough, chest tightness, shortness of breath and wheezing.   ?Cardiovascular:  Negative for chest pain, palpitations and leg swelling.  ?Gastrointestinal:  Negative for abdominal pain, constipation, diarrhea, nausea and vomiting.  ?Endocrine: Negative.   ?Genitourinary:  Positive for urgency. Negative for decreased urine volume, difficulty urinating, dysuria, enuresis, frequency, hematuria, penile swelling, scrotal swelling and testicular pain.  ?Musculoskeletal: Negative.   ?Skin: Negative.   ?Neurological: Negative.  Negative for dizziness, weakness and  light-headedness.  ?Hematological:  Negative for adenopathy. Does not bruise/bleed easily.  ?Psychiatric/Behavioral: Negative.    ? ?Objective:  ?BP 126/82 (BP Location: Left Arm, Patient Position: Sitting, Cuff Size: Large)   Pulse 67   Temp 98.2 ?F (36.8 ?C) (Oral)   Ht '5\' 11"'$  (1.803 m)   Wt 211 lb (95.7 kg)   SpO2 98%   BMI 29.43 kg/m?  ? ?BP Readings from Last 3 Encounters:  ?04/01/21 126/82  ?12/03/20 130/80  ?11/22/20 (!) 161/70  ? ? ?Wt Readings from Last 3 Encounters:  ?04/01/21 211 lb (95.7 kg)  ?12/03/20 210 lb (95.3 kg)  ?11/22/20 208 lb 12.8 oz (94.7 kg)  ? ? ?Physical Exam ?Vitals reviewed.  ?HENT:  ?   Nose: Nose normal.  ?   Mouth/Throat:  ?   Mouth: Mucous membranes are moist.  ?Eyes:  ?   General: No scleral icterus. ?   Conjunctiva/sclera: Conjunctivae normal.  ?Cardiovascular:  ?   Rate and Rhythm: Normal rate and regular rhythm.  ?   Heart sounds: Normal heart sounds, S1 normal and S2 normal. No murmur heard. ?   Comments: EKG- ?NSR, 66 bpm ?Normal EKG ?Pulmonary:  ?   Effort: Pulmonary effort is normal.  ?   Breath sounds: No stridor. No wheezing, rhonchi or rales.  ?Abdominal:  ?   General: Abdomen is flat.  ?   Palpations: There is no mass.  ?   Tenderness: There is no abdominal tenderness. There is no guarding.  ?   Hernia: No hernia is present.  ?Musculoskeletal:  ?   Cervical back: Neck supple.  ?   Right lower leg: No edema.  ?   Left  lower leg: No edema.  ?Lymphadenopathy:  ?   Cervical: No cervical adenopathy.  ?Skin: ?   General: Skin is warm and dry.  ?   Findings: No lesion.  ?Neurological:  ?   General: No focal deficit present.  ?   Mental Status: He is alert.  ?Psychiatric:     ?   Mood and Affect: Mood normal.     ?   Behavior: Behavior normal.  ? ? ?Lab Results  ?Component Value Date  ? WBC 24.3 Repeated and verified X2. ([place]) 04/01/2021  ? HGB 16.2 04/01/2021  ? HCT 48.1 04/01/2021  ? PLT 166.0 04/01/2021  ? GLUCOSE 113 (H) 04/01/2021  ? CHOL 132 04/01/2021  ? TRIG 133.0  04/01/2021  ? HDL 30.20 (L) 04/01/2021  ? [place] 76 04/01/2021  ? ALT 23 04/01/2021  ? AST 25 04/01/2021  ? NA 137 04/01/2021  ? K 4.7 04/01/2021  ? CL 103 04/01/2021  ? CREATININE 1.16 04/01/2021  ? BUN 15 04/01/2021  ? CO2 29 04/01/2021  ? TSH 6.22 (H) 04/01/2021  ? PSA 3.38 03/28/2019  ? HGBA1C 6.1 04/01/2021  ? ? ?VAS US CAROTID ? ?Result Date: 12/11/2020 ?Carotid Arterial Duplex Study Patient Name:  Francisco Larson  Date of Exam:   12/10/2020 Medical Rec #: [id number]          Accession #:    [id number] Date of Birth: [birth year]          Patient Gender: M Patient Age:   52 years Exam Location:  [facility] Procedure:      VAS US CAROTID Referring Phys: Jenkins Rouge --------------------------------------------------------------------------------  Indications:       Left bruit, Carotid artery disease and patient denies any                    cerebrovascular symptoms. Risk Factors:      Hyperlipidemia, no history of smoking, coronary artery                    disease. Comparison Study:  In 08/2017, a carotid duplex showed velocities of 77/18 cm/s                    in the RICA and 40/10 cm/s in the LICA. Performing Technologist: Sharlett Iles RVT  Examination Guidelines: A complete evaluation includes B-mode imaging, spectral Doppler, color Doppler, and power Doppler as needed of all accessible portions of each vessel. Bilateral testing is considered an integral part of a complete examination. Limited examinations for reoccurring indications may be performed as noted.  Right Carotid Findings: +----------+--------+--------+--------+------------------+--------+           PSV cm/sEDV cm/sStenosisPlaque DescriptionComments +----------+--------+--------+--------+------------------+--------+ CCA Prox  79      8                                          +----------+--------+--------+--------+------------------+--------+ CCA Distal66      11      <50%    heterogenous                +----------+--------+--------+--------+------------------+--------+ ICA Prox  83      16              heterogenous               +----------+--------+--------+--------+------------------+--------+ ICA Mid   78      17  1-39%   heterogenous               +----------+--------+--------+--------+------------------+--------+ ICA Distal79      21                                         +----------+--------+--------+--------+------------------+--------+ ECA       171     0               heterogenous               +----------+--------+--------+--------+------------------+--------+ +----------+--------+-------+----------------+-------------------+           PSV cm/sEDV cmsDescribe        Arm Pressure (mmHG) +----------+--------+-------+----------------+-------------------+ [id number]            Multiphasic, [id number]                 +----------+--------+-------+----------------+-------------------+ +---------+--------+--+--------+--+---------+ VertebralPSV cm/s79EDV cm/s15Antegrade +---------+--------+--+--------+--+---------+ Stable RICA velocities when compared to the prior exam. Left Carotid Findings: +----------+--------+--------+--------+------------------+----------+           PSV cm/sEDV cm/sStenosisPlaque DescriptionComments   +----------+--------+--------+--------+------------------+----------+ CCA Prox  86      11                                           +----------+--------+--------+--------+------------------+----------+ CCA Distal62      9       <50%    heterogenous                 +----------+--------+--------+--------+------------------+----------+ ICA Prox  44      12      1-39%   heterogenous                 +----------+--------+--------+--------+------------------+----------+ ICA Mid   72      24                                steep dive +----------+--------+--------+--------+------------------+----------+ ICA Distal91       26                                steep dive +----------+--------+--------+--------+------------------+----------+ ECA       141     0               heterogenous                 +----------+--------+--------+--------+------------------+----------+ +----------+--------+--------+----------------+-------------------+           PSV cm/sEDV

## 2021-04-01 NOTE — Patient Instructions (Signed)

## 2021-04-01 NOTE — Telephone Encounter (Signed)
CRITICAL VALUE STICKER ? ?CRITICAL VALUE: WBC 24.3 ? ?RECEIVER (on-site recipient of call): Lovena Le A. Alroy Dust, CMA ? ?DATE & TIME NOTIFIED: 04/01/21 at 11:04a ? ?MESSENGER (representative from lab): Kenney Houseman, Lab Tech ? ?MD NOTIFIED: yes ? ?TIME OF NOTIFICATION: 11:06a ? ?RESPONSE: waiting response ? ?

## 2021-04-02 DIAGNOSIS — R8281 Pyuria: Secondary | ICD-10-CM | POA: Diagnosis not present

## 2021-04-02 LAB — THYROID PANEL WITH TSH
Free Thyroxine Index: 1.9 (ref 1.4–3.8)
T3 Uptake: 31 % (ref 22–35)
T4, Total: 6 ug/dL (ref 4.9–10.5)
TSH: 6.22 mIU/L — ABNORMAL HIGH (ref 0.40–4.50)

## 2021-04-05 ENCOUNTER — Other Ambulatory Visit: Payer: Self-pay | Admitting: Cardiovascular Disease

## 2021-04-05 LAB — CULTURE, URINE COMPREHENSIVE

## 2021-04-15 ENCOUNTER — Ambulatory Visit (INDEPENDENT_AMBULATORY_CARE_PROVIDER_SITE_OTHER): Payer: Medicare PPO

## 2021-04-15 DIAGNOSIS — I4819 Other persistent atrial fibrillation: Secondary | ICD-10-CM | POA: Diagnosis not present

## 2021-04-16 LAB — CUP PACEART REMOTE DEVICE CHECK
Date Time Interrogation Session: 20230331230801
Implantable Pulse Generator Implant Date: 20211221

## 2021-04-30 NOTE — Progress Notes (Signed)
Carelink Summary Report / Loop Recorder 

## 2021-05-07 ENCOUNTER — Encounter: Payer: Self-pay | Admitting: Podiatry

## 2021-05-07 ENCOUNTER — Ambulatory Visit: Payer: Medicare PPO | Admitting: Podiatry

## 2021-05-07 DIAGNOSIS — B351 Tinea unguium: Secondary | ICD-10-CM

## 2021-05-07 DIAGNOSIS — M79674 Pain in right toe(s): Secondary | ICD-10-CM | POA: Diagnosis not present

## 2021-05-07 DIAGNOSIS — M79675 Pain in left toe(s): Secondary | ICD-10-CM

## 2021-05-14 NOTE — Progress Notes (Signed)
?  Subjective:  ?Patient ID: Francisco Larson, male    DOB: 1935-08-12,  MRN: [id number] ? ?Jadakiss Knox Saliva presents to clinic today for painful elongated mycotic toenails 1-5 bilaterally which are tender when wearing enclosed shoe gear. Pain is relieved with periodic professional debridement. ? ?New problem(s): None.  ? ?PCP is Janith Lima, MD , and last visit was April 01, 2021. ? ?Allergies  ?Allergen Reactions  ? Azithromycin Other (See Comments) and Itching  ?  Pt stated he thinks this made him dizzy ?Pt stated he thinks this made him dizzy  ? ? ?Review of Systems: Negative except as noted in the HPI. ? ?Objective: No changes noted in today's physical examination. ?Constitutional Francisco VANBENSCHOTEN is a pleasant 86 y.o. Caucasian male, WD, WN in NAD. AAO x 3.   ?Vascular CFT immediate b/l LE. Palpable DP/PT pulses b/l LE. Digital hair sparse b/l. Skin temperature gradient WNL b/l. No pain with calf compression b/l. No edema noted b/l. No cyanosis or clubbing noted b/l LE.  ?Neurologic Normal speech. Oriented to person, place, and time. Protective sensation intact 5/5 intact bilaterally with 10g monofilament b/l.  ?Dermatologic Pedal skin warm and supple b/l.  No open wounds b/l. No interdigital macerations. Toenails 1-5 b/l elongated, thickened, discolored with subungual debris. +Tenderness with dorsal palpation of nailplates. No hyperkeratotic nor porokeratotic lesions noted b/l.  ?Orthopedic: Muscle strength 5/5 to all lower extremity muscle groups bilaterally. No pain, crepitus or joint limitation noted with ROM bilateral LE. No gross bony deformities bilaterally. Patient ambulates independent of any assistive aids.  ? ?Radiographs: None ? ?  Latest Ref Rng & Units 04/01/2021  ?  8:28 AM  ?Hemoglobin A1C  ?Hemoglobin-A1c 4.6 - 6.5 % 6.1    ? ?Assessment/Plan: ?1. Pain due to onychomycosis of toenails of both feet   ?  ?-Examined patient. ?-Patient to continue soft, supportive shoe gear daily. ?-Toenails  1-5 b/l were debrided in length and girth with sterile nail nippers and dremel without iatrogenic bleeding.  ?-Patient/POA to call should there be question/concern in the interim.  ? ?Return in about 3 months (around 08/06/2021). ? ?Marzetta Board, DPM  ?

## 2021-05-20 ENCOUNTER — Ambulatory Visit (INDEPENDENT_AMBULATORY_CARE_PROVIDER_SITE_OTHER): Payer: Medicare PPO

## 2021-05-20 DIAGNOSIS — I4819 Other persistent atrial fibrillation: Secondary | ICD-10-CM

## 2021-05-20 LAB — CUP PACEART REMOTE DEVICE CHECK
Date Time Interrogation Session: 20230507231219
Implantable Pulse Generator Implant Date: 20211221

## 2021-06-05 NOTE — Progress Notes (Signed)
Carelink Summary Report / Loop Recorder 

## 2021-06-24 ENCOUNTER — Ambulatory Visit (INDEPENDENT_AMBULATORY_CARE_PROVIDER_SITE_OTHER): Payer: Medicare PPO

## 2021-06-24 DIAGNOSIS — I484 Atypical atrial flutter: Secondary | ICD-10-CM | POA: Diagnosis not present

## 2021-06-26 LAB — CUP PACEART REMOTE DEVICE CHECK
Date Time Interrogation Session: 20230609230547
Implantable Pulse Generator Implant Date: 20211221

## 2021-07-22 ENCOUNTER — Ambulatory Visit (INDEPENDENT_AMBULATORY_CARE_PROVIDER_SITE_OTHER): Payer: Medicare PPO

## 2021-07-22 DIAGNOSIS — Z0000 Encounter for general adult medical examination without abnormal findings: Secondary | ICD-10-CM | POA: Diagnosis not present

## 2021-07-22 NOTE — Patient Instructions (Signed)
[name] , Thank you for taking time to come for your Medicare Wellness Visit. I appreciate your ongoing commitment to your health goals. Please review the following plan we discussed and let me know if I can assist you in the future.   Screening recommendations/referrals: Colonoscopy: Last done 10/18/2014; No longer recommended due to age Recommended yearly ophthalmology/optometry visit for glaucoma screening and checkup Recommended yearly dental visit for hygiene and checkup  Vaccinations: Influenza vaccine: 11/22/2020 Pneumococcal vaccine: 02/28/2014, 04/01/2021 Tdap vaccine: 03/18/2017; due every 10 years Shingles vaccine: 03/29/2020, 06/06/2020   Covid-19: 01/28/2019, 02/17/2019, 09/05/2019, 04/12/2020, 09/19/2020, 06/26/2021  Advanced directives: Yes; documents on file.  Conditions/risks identified: Yes  Next appointment: Please schedule your next Medicare Wellness Visit with your Nurse Health Advisor in 1 year by calling [phone].  Preventive Care 10 Years and Older, Male Preventive care refers to lifestyle choices and visits with your health care provider that can promote health and wellness. What does preventive care include? A yearly physical exam. This is also called an annual well check. Dental exams once or twice a year. Routine eye exams. Ask your health care provider how often you should have your eyes checked. Personal lifestyle choices, including: Daily care of your teeth and gums. Regular physical activity. Eating a healthy diet. Avoiding tobacco and drug use. Limiting alcohol use. Practicing safe sex. Taking low doses of aspirin every day. Taking vitamin and mineral supplements as recommended by your health care provider. What happens during an annual well check? The services and screenings done by your health care provider during your annual well check will depend on your age, overall health, lifestyle risk factors, and family history of disease. Counseling  Your  health care provider may ask you questions about your: Alcohol use. Tobacco use. Drug use. Emotional well-being. Home and relationship well-being. Sexual activity. Eating habits. History of falls. Memory and ability to understand (cognition). Work and work Statistician. Screening  You may have the following tests or measurements: Height, weight, and BMI. Blood pressure. Lipid and cholesterol levels. These may be checked every 5 years, or more frequently if you are over 70 years old. Skin check. Lung cancer screening. You may have this screening every year starting at age 34 if you have a 30-pack-year history of smoking and currently smoke or have quit within the past 15 years. Fecal occult blood test (FOBT) of the stool. You may have this test every year starting at age 47. Flexible sigmoidoscopy or colonoscopy. You may have a sigmoidoscopy every 5 years or a colonoscopy every 10 years starting at age 28. Prostate cancer screening. Recommendations will vary depending on your family history and other risks. Hepatitis C blood test. Hepatitis B blood test. Sexually transmitted disease (STD) testing. Diabetes screening. This is done by checking your blood sugar (glucose) after you have not eaten for a while (fasting). You may have this done every 1-3 years. Abdominal aortic aneurysm (AAA) screening. You may need this if you are a current or former smoker. Osteoporosis. You may be screened starting at age 17 if you are at high risk. Talk with your health care provider about your test results, treatment options, and if necessary, the need for more tests. Vaccines  Your health care provider may recommend certain vaccines, such as: Influenza vaccine. This is recommended every year. Tetanus, diphtheria, and acellular pertussis (Tdap, Td) vaccine. You may need a Td booster every 10 years. Zoster vaccine. You may need this after age 36. Pneumococcal 13-valent conjugate (PCV13) vaccine. One  dose  is recommended after age 58. Pneumococcal polysaccharide (PPSV23) vaccine. One dose is recommended after age 60. Talk to your health care provider about which screenings and vaccines you need and how often you need them. This information is not intended to replace advice given to you by your health care provider. Make sure you discuss any questions you have with your health care provider. Document Released: 01/26/2015 Document Revised: 09/19/2015 Document Reviewed: 10/31/2014 Elsevier Interactive Patient Education  2017 [place] Prevention in the Home Falls can cause injuries. They can happen to people of all ages. There are many things you can do to make your home safe and to help prevent falls. What can I do on the outside of my home? Regularly fix the edges of walkways and driveways and fix any cracks. Remove anything that might make you trip as you walk through a door, such as a raised step or threshold. Trim any bushes or trees on the path to your home. Use bright outdoor lighting. Clear any walking paths of anything that might make someone trip, such as rocks or tools. Regularly check to see if handrails are loose or broken. Make sure that both sides of any steps have handrails. Any raised decks and porches should have guardrails on the edges. Have any leaves, snow, or ice cleared regularly. Use sand or salt on walking paths during winter. Clean up any spills in your garage right away. This includes oil or grease spills. What can I do in the bathroom? Use night lights. Install grab bars by the toilet and in the tub and shower. Do not use towel bars as grab bars. Use non-skid mats or decals in the tub or shower. If you need to sit down in the shower, use a plastic, non-slip stool. Keep the floor dry. Clean up any water that spills on the floor as soon as it happens. Remove soap buildup in the tub or shower regularly. Attach bath mats securely with double-sided non-slip rug  tape. Do not have throw rugs and other things on the floor that can make you trip. What can I do in the bedroom? Use night lights. Make sure that you have a light by your bed that is easy to reach. Do not use any sheets or blankets that are too big for your bed. They should not hang down onto the floor. Have a firm chair that has side arms. You can use this for support while you get dressed. Do not have throw rugs and other things on the floor that can make you trip. What can I do in the kitchen? Clean up any spills right away. Avoid walking on wet floors. Keep items that you use a lot in easy-to-reach places. If you need to reach something above you, use a strong step stool that has a grab bar. Keep electrical cords out of the way. Do not use floor polish or wax that makes floors slippery. If you must use wax, use non-skid floor wax. Do not have throw rugs and other things on the floor that can make you trip. What can I do with my stairs? Do not leave any items on the stairs. Make sure that there are handrails on both sides of the stairs and use them. Fix handrails that are broken or loose. Make sure that handrails are as long as the stairways. Check any carpeting to make sure that it is firmly attached to the stairs. Fix any carpet that is loose or worn.  Avoid having throw rugs at the top or bottom of the stairs. If you do have throw rugs, attach them to the floor with carpet tape. Make sure that you have a light switch at the top of the stairs and the bottom of the stairs. If you do not have them, ask someone to add them for you. What else can I do to help prevent falls? Wear shoes that: Do not have high heels. Have rubber bottoms. Are comfortable and fit you well. Are closed at the toe. Do not wear sandals. If you use a stepladder: Make sure that it is fully opened. Do not climb a closed stepladder. Make sure that both sides of the stepladder are locked into place. Ask someone to  hold it for you, if possible. Clearly mark and make sure that you can see: Any grab bars or handrails. First and last steps. Where the edge of each step is. Use tools that help you move around (mobility aids) if they are needed. These include: Canes. Walkers. Scooters. Crutches. Turn on the lights when you go into a dark area. Replace any light bulbs as soon as they burn out. Set up your furniture so you have a clear path. Avoid moving your furniture around. If any of your floors are uneven, fix them. If there are any pets around you, be aware of where they are. Review your medicines with your doctor. Some medicines can make you feel dizzy. This can increase your chance of falling. Ask your doctor what other things that you can do to help prevent falls. This information is not intended to replace advice given to you by your health care provider. Make sure you discuss any questions you have with your health care provider. Document Released: 10/26/2008 Document Revised: 06/07/2015 Document Reviewed: 02/03/2014 Elsevier Interactive Patient Education  2017 [employer].

## 2021-07-22 NOTE — Progress Notes (Signed)
I connected with Francisco Larson today by telephone and verified that I am speaking with the correct person using two identifiers. Location patient: home Location provider: work Persons participating in the virtual visit: patient, provider.   I discussed the limitations, risks, security and privacy concerns of performing an evaluation and management service by telephone and the availability of in person appointments. I also discussed with the patient that there may be a patient responsible charge related to this service. The patient expressed understanding and verbally consented to this telephonic visit.    Interactive audio and video telecommunications were attempted between this provider and patient, however failed, due to patient having technical difficulties OR patient did not have access to video capability.  We continued and completed visit with audio only.  Some vital signs may be absent or patient reported.   Time Spent with patient on telephone encounter: 30 minutes  Subjective:   Francisco Larson is a 86 y.o. male who presents for Medicare Annual/Subsequent preventive examination.  Review of Systems     Cardiac Risk Factors include: advanced age (>77mn, >>38women);dyslipidemia;family history of premature cardiovascular disease;male gender     Objective:    There were no vitals filed for this visit. There is no height or weight on file to calculate BMI.     07/22/2021    1:42 PM 12/16/2019    6:41 AM 03/19/2016   12:42 PM 10/04/2014    1:27 PM 09/12/2014   11:02 AM  Advanced Directives  Does Patient Have a Medical Advance Directive? Yes Yes Yes Yes Yes  Type of Advance Directive Living will;Healthcare Power of [place]   Does patient want to make changes to medical advance directive? No - Patient declined   No - Patient declined   Copy of [place] Chart? Yes - validated most recent copy scanned in chart (See row information) No - copy requested No - copy requested No - copy requested     Current Medications (verified) Outpatient Encounter Medications as of 07/22/2021  Medication Sig   atorvastatin (LIPITOR) 20 MG tablet TAKE 1 TABLET(20 MG) BY MOUTH DAILY   betamethasone dipropionate 0.05 % cream Apply 1 application topically daily as needed (Dryness on Hand).    Multiple Vitamins-Minerals (CENTRUM SILVER PO) Take 1 tablet by mouth daily.    No facility-administered encounter medications on file as of 07/22/2021.    Allergies (verified) Azithromycin   History: Past Medical History:  Diagnosis Date   Arthritis    Hyperlipidemia    Leukemia, chronic lymphocytic (HCC)    SCCA (squamous cell carcinoma) of skin 08/08/2020   Mid Frontal Scalp (in situ) (curet and 5FU)   SCCA (squamous cell carcinoma) of skin 08/08/2020   Mid Parietal Scalp (in situ) (curet and 5FU)   SCCA (squamous cell carcinoma) of skin 08/08/2020   Mid Supratip of Nose (in situ)   Shingles    Squamous cell carcinoma of skin 12/08/2018   right jaw line cis   Vitamin D deficiency    Past Surgical History:  Procedure Laterality Date   CARDIOVERSION N/A 12/16/2019   Procedure: CARDIOVERSION;  Surgeon: CSanda Klein MD;  Location: [facility];  Service: Cardiovascular;  Laterality: N/A;   EYE SURGERY     LAPAROSCOPIC TRANS ANAL AND TRANSABDOMINAL RECTAL RESECTION WITH COLOANAL ANASTOMOSIS     SURGERY OF LIP     tumor   TONSILLECTOMY  VASECTOMY     Family History  Problem Relation Age of Onset   Colon cancer Sister    Hyperlipidemia Sister    Stroke Sister    Heart disease Father    Emphysema Mother        never smoker, but exposed to 2nd hand from spouse   Cancer Mother    COPD Maternal Grandmother        never smoker   Stroke Maternal Grandfather    Diabetes Paternal Grandmother    Mental illness Paternal Grandmother     Asthma Sister    Social History   Socioeconomic History   Marital status: Married    Spouse name: Not on file   Number of children: 2   Years of education: Not on file   Highest education level: Not on file  Occupational History   Occupation: Retired    Comment: Professor [employer]  Tobacco Use   Smoking status: Never   Smokeless tobacco: Never  Vaping Use   Vaping Use: Never used  Substance and Sexual Activity   Alcohol use: No   Drug use: No   Sexual activity: Not on file  Other Topics Concern   Not on file  Social History Narrative   Not on file   Social Determinants of Health   Financial Resource Strain: Low Risk  (07/22/2021)   Overall Financial Resource Strain (CARDIA)    Difficulty of Paying Living Expenses: Not hard at all  Food Insecurity: No Food Insecurity (07/22/2021)   Hunger Vital Sign    Worried About Running Out of Food in the Last Year: Never true    [place] in the Last Year: Never true  Transportation Needs: No Transportation Needs (07/22/2021)   PRAPARE - Hydrologist (Medical): No    Lack of Transportation (Non-Medical): No  Physical Activity: Sufficiently Active (07/22/2021)   Exercise Vital Sign    Days of Exercise per Week: 7 days    Minutes of Exercise per Session: 60 min  Stress: No Stress Concern Present (07/22/2021)   [place]    Feeling of Stress : Not at all  Social Connections: [place] (07/22/2021)   Social Connection and Isolation Panel [NHANES]    Frequency of Communication with Friends and Family: More than three times a week    Frequency of Social Gatherings with Friends and Family: More than three times a week    Attends Religious Services: More than 4 times per year    Active Member of [employer] or Organizations: Yes    Attends Music therapist: More than 4 times per year    Marital Status: Married    Tobacco  Counseling Counseling given: Not Answered   Clinical Intake:  Pre-visit preparation completed: Yes  Pain : No/denies pain     BMI - recorded: 29.44 Nutritional Status: BMI 25 -29 Overweight Nutritional Risks: None Diabetes: No  How often do you need to have someone help you when you read instructions, pamphlets, or other written materials from your doctor or pharmacy?: 1 - Never What is the last grade level you completed in school?: Ph.D  Diabetic? no  Interpreter Needed?: No  Information entered by :: Lisette Abu, LPN   Activities of Daily Living    07/22/2021    1:44 PM  In your present state of health, do you have any difficulty performing the following activities:  Hearing? 1  Comment  wears hearing aids  Vision? 0  Difficulty concentrating or making decisions? 0  Walking or climbing stairs? 0  Dressing or bathing? 0  Doing errands, shopping? 0  Preparing Food and eating ? N  Using the Toilet? N  In the past six months, have you accidently leaked urine? N  Do you have problems with loss of bowel control? N  Managing your Medications? N  Managing your Finances? N  Housekeeping or managing your Housekeeping? N    Patient Care Team: Janith Lima, MD as PCP - General (Internal Medicine) Josue Hector, MD as Consulting Physician (Cardiology) Magrinat, Virgie Dad, MD (Inactive) as Consulting Physician (Oncology) Alphonsa Overall, MD as Consulting Physician (General Surgery) Belva Crome, MD as Consulting Physician (Cardiology) [name], MD as Consulting Physician (Dermatology) Vickie Epley, MD as Consulting Physician (Cardiology)  Indicate any recent Medical Services you may have received from other than [facility] providers in the past year (date may be approximate).     Assessment:   This is a routine wellness examination for Kieth.  Hearing/Vision screen Hearing Screening - Comments:: Patient has hearing difficulty and wears hearing  aids. Vision Screening - Comments:: Patient does wear readers.  Eye exam done by: [facility]   Dietary issues and exercise activities discussed: Current Exercise Habits: Home exercise routine, Type of exercise: walking;treadmill;stretching;strength training/weights;Other - see comments, Time (Minutes): 60, Frequency (Times/Week): 7, Weekly Exercise (Minutes/Week): 420, Intensity: Moderate, Exercise limited by: None identified   Goals Addressed             This Visit's Progress    My goal is to shed a few pounds.        Depression Screen    07/22/2021    1:40 PM 04/01/2021    7:58 AM 03/28/2020    8:05 AM 03/28/2019    8:19 AM 03/22/2018    8:11 AM 03/19/2016   12:43 PM 03/17/2016    8:39 AM  PHQ 2/9 Scores  PHQ - 2 Score 0 0 0 0 0 0 0  Exception Documentation       Patient refusal    Fall Risk    07/22/2021    1:43 PM 04/01/2021    7:58 AM 03/28/2020    8:05 AM 03/28/2019    8:20 AM 03/23/2018    3:54 PM  [place] in the past year? 0 0 0 0 0  Number falls in past yr: 0   0   Injury with Fall? 0   0   Risk for fall due to : No Fall Risks      Follow up Falls evaluation completed   Falls evaluation completed     [place]:  Any stairs in or around the home? No  If so, are there any without handrails? No  Home free of loose throw rugs in walkways, pet beds, electrical cords, etc? Yes  Adequate lighting in your home to reduce risk of falls? Yes   ASSISTIVE DEVICES UTILIZED TO PREVENT FALLS:  Life alert? Yes  Use of a cane, walker or w/c? No  Grab bars in the bathroom? Yes  Shower chair or bench in shower? Yes  Elevated toilet seat or a handicapped toilet? Yes   TIMED UP AND GO:  Was the test performed? No .  Length of time to ambulate 10 feet: n/a sec.   Appearance of gait: Gait not evaluated during this visit.  Cognitive Function:  07/22/2021    1:50 PM  6CIT Screen  What Year? 0 points  What  month? 0 points  What time? 0 points  Count back from 20 0 points  Months in reverse 0 points  Repeat phrase 0 points  Total Score 0 points    Immunizations Immunization History  Administered Date(s) Administered   Fluad Quad(high Dose 65+) 11/22/2018, 11/14/2019, 11/22/2020   Influenza Split 11/27/2011   Influenza, High Dose Seasonal PF 11/08/2015, 11/05/2016, 11/25/2017   Influenza,inj,Quad PF,6+ Mos 11/29/2012, 10/31/2013   Influenza-Unspecified 11/17/2014   PFIZER(Purple Top)SARS-COV-2 Vaccination 01/28/2019, 02/17/2019, 09/05/2019, 04/12/2020   Pfizer Covid-19 Vaccine Bivalent Booster 30yr & up 09/19/2020, 06/26/2021   Pneumococcal Conjugate-13 02/28/2014   Pneumococcal Polysaccharide-23 11/20/2009, 06/21/2015, 04/01/2021   Tdap 11/25/2005, 03/18/2017   Zoster Recombinat (Shingrix) 03/29/2020, 06/06/2020    TDAP status: Up to date  Flu Vaccine status: Up to date  Pneumococcal vaccine status: Up to date  Covid-19 vaccine status: Completed vaccines  Qualifies for Shingles Vaccine? Yes   Zostavax completed No   Shingrix Completed?: Yes  Screening Tests Health Maintenance  Topic Date Due   INFLUENZA VACCINE  08/13/2021   TETANUS/TDAP  03/19/2027   Pneumonia Vaccine 86 Years old  Completed   COVID-19 Vaccine  Completed   Zoster Vaccines- Shingrix  Completed   HPV VACCINES  Aged Out    Health Maintenance  There are no preventive care reminders to display for this patient.   Colorectal cancer screening: No longer required.   Lung Cancer Screening: (Low Dose CT Chest recommended if Age 84-80years, 30 pack-year currently smoking OR have quit w/in 15years.) does not qualify.   Lung Cancer Screening Referral: no  Additional Screening:  Hepatitis C Screening: does not qualify; Completed no  Vision Screening: Recommended annual ophthalmology exams for early detection of glaucoma and other disorders of the eye. Is the patient up to date with their annual eye  exam?  Yes  Who is the provider or what is the name of the office in which the patient attends annual eye exams[facility] If pt is not established with a provider, would they like to be referred to a provider to establish care? No .   Dental Screening: Recommended annual dental exams for proper oral hygiene  Community Resource Referral / Chronic Care Management: CRR required this visit?  No   CCM required this visit?  No      Plan:     I have personally reviewed and noted the following in the patient's chart:   Medical and social history Use of alcohol, tobacco or illicit drugs  Current medications and supplements including opioid prescriptions. Patient is not currently taking opioid prescriptions. Functional ability and status Nutritional status Physical activity Advanced directives List of other physicians Hospitalizations, surgeries, and ER visits in previous 12 months Vitals Screenings to include cognitive, depression, and falls Referrals and appointments  In addition, I have reviewed and discussed with patient certain preventive protocols, quality metrics, and best practice recommendations. A written personalized care plan for preventive services as well as general preventive health recommendations were provided to patient.     SSheral Flow LPN   79/03/90  Nurse Notes:  Patient is cogitatively intact. There were no vitals filed for this visit. There is no height or weight on file to calculate BMI. Patient stated that he has no issues with gait or balance; does not use any assistive devices.

## 2021-07-25 LAB — CUP PACEART REMOTE DEVICE CHECK
Date Time Interrogation Session: 20230712231338
Implantable Pulse Generator Implant Date: 20211221

## 2021-07-29 ENCOUNTER — Ambulatory Visit (INDEPENDENT_AMBULATORY_CARE_PROVIDER_SITE_OTHER): Payer: Medicare PPO

## 2021-07-29 DIAGNOSIS — I484 Atypical atrial flutter: Secondary | ICD-10-CM

## 2021-08-13 ENCOUNTER — Ambulatory Visit: Payer: Medicare PPO | Admitting: Podiatry

## 2021-08-13 ENCOUNTER — Encounter: Payer: Self-pay | Admitting: Podiatry

## 2021-08-13 DIAGNOSIS — M79675 Pain in left toe(s): Secondary | ICD-10-CM | POA: Diagnosis not present

## 2021-08-13 DIAGNOSIS — M79674 Pain in right toe(s): Secondary | ICD-10-CM | POA: Diagnosis not present

## 2021-08-13 DIAGNOSIS — B351 Tinea unguium: Secondary | ICD-10-CM | POA: Diagnosis not present

## 2021-08-13 NOTE — Progress Notes (Signed)
  Subjective:  Patient ID: Francisco Larson, male    DOB: July 06, 1935,  MRN: [id number]  Francisco Larson presents to clinic today for painful elongated mycotic toenails 1-5 bilaterally which are tender when wearing enclosed shoe gear. Pain is relieved with periodic professional debridement.  New problem(s): None.   PCP is Janith Lima, MD , and last visit was  April 01, 2021  Allergies  Allergen Reactions   Azithromycin Other (See Comments) and Itching    Pt stated he thinks this made him dizzy Pt stated he thinks this made him dizzy    Review of Systems: Negative except as noted in the HPI.  Objective: No changes noted in today's physical examination. Constitutional Francisco Larson is a pleasant 86 y.o. Caucasian male, WD, WN in NAD. AAO x 3.   Vascular CFT immediate b/l LE. Palpable DP/PT pulses b/l LE. Digital hair sparse b/l. Skin temperature gradient WNL b/l. No pain with calf compression b/l. No edema noted b/l. No cyanosis or clubbing noted b/l LE.  Neurologic Normal speech. Oriented to person, place, and time. Protective sensation intact 5/5 intact bilaterally with 10g monofilament b/l.  Dermatologic Pedal skin warm and supple b/l.  No open wounds b/l. No interdigital macerations. Toenails 1-5 b/l elongated, thickened, discolored with subungual debris. +Tenderness with dorsal palpation of nailplates. No hyperkeratotic nor porokeratotic lesions noted b/l.  Orthopedic: Muscle strength 5/5 to all lower extremity muscle groups bilaterally. No pain, crepitus or joint limitation noted with ROM bilateral LE. No gross bony deformities bilaterally. Patient ambulates independent of any assistive aids.   Radiographs: None    Latest Ref Rng & Units 04/01/2021    8:28 AM  Hemoglobin A1C  Hemoglobin-A1c 4.6 - 6.5 % 6.1    Assessment/Plan: 1. Pain due to onychomycosis of toenails of both feet     -Patient was evaluated and treated. All patient's and/or POA's questions/concerns  answered on today's visit. -No new findings. No new orders. -Patient to continue soft, supportive shoe gear daily. -Mycotic toenails 1-5 bilaterally were debrided in length and girth with sterile nail nippers and dremel without incident. -Patient/POA to call should there be question/concern in the interim.   Return in about 3 months (around 11/13/2021).  Marzetta Board, DPM

## 2021-08-28 NOTE — Progress Notes (Signed)
Carelink Summary Report / Loop Recorder 

## 2021-08-29 LAB — CUP PACEART REMOTE DEVICE CHECK
Date Time Interrogation Session: 20230814230821
Implantable Pulse Generator Implant Date: 20211221

## 2021-09-02 ENCOUNTER — Ambulatory Visit (INDEPENDENT_AMBULATORY_CARE_PROVIDER_SITE_OTHER): Payer: Medicare PPO

## 2021-09-02 DIAGNOSIS — I4819 Other persistent atrial fibrillation: Secondary | ICD-10-CM | POA: Diagnosis not present

## 2021-09-28 ENCOUNTER — Other Ambulatory Visit: Payer: Self-pay | Admitting: Cardiovascular Disease

## 2021-09-30 ENCOUNTER — Other Ambulatory Visit: Payer: Self-pay

## 2021-09-30 LAB — CUP PACEART REMOTE DEVICE CHECK
Date Time Interrogation Session: 20230916230629
Implantable Pulse Generator Implant Date: 20211221

## 2021-09-30 MED ORDER — ATORVASTATIN CALCIUM 20 MG PO TABS: ORAL_TABLET | ORAL | 0 refills | Status: DC

## 2021-09-30 NOTE — Telephone Encounter (Signed)
Pt's medication was sent to pt's pharmacy as requested. Confirmation received.  °

## 2021-09-30 NOTE — Progress Notes (Signed)
Carelink Summary Report / Loop Recorder 

## 2021-10-07 ENCOUNTER — Ambulatory Visit (INDEPENDENT_AMBULATORY_CARE_PROVIDER_SITE_OTHER): Payer: Medicare PPO

## 2021-10-07 DIAGNOSIS — I4819 Other persistent atrial fibrillation: Secondary | ICD-10-CM | POA: Diagnosis not present

## 2021-10-21 ENCOUNTER — Other Ambulatory Visit: Payer: Self-pay | Admitting: *Deleted

## 2021-10-21 MED ORDER — ATORVASTATIN CALCIUM 20 MG PO TABS: ORAL_TABLET | ORAL | 0 refills | Status: DC

## 2021-10-24 NOTE — Progress Notes (Signed)
Carelink Summary Report / Loop Recorder 

## 2021-11-11 ENCOUNTER — Ambulatory Visit (INDEPENDENT_AMBULATORY_CARE_PROVIDER_SITE_OTHER): Payer: Medicare PPO

## 2021-11-11 DIAGNOSIS — I4819 Other persistent atrial fibrillation: Secondary | ICD-10-CM

## 2021-11-12 LAB — CUP PACEART REMOTE DEVICE CHECK
Date Time Interrogation Session: 20231029231157
Implantable Pulse Generator Implant Date: 20211221

## 2021-11-18 NOTE — Progress Notes (Signed)
Patient ID: Francisco Larson, male   DOB: 02-01-1935, 86 y.o.   MRN: [id number]     Cardiology Office Note   Date:  12/02/2021   ID:  ROXANNE ORNER, DOB 1935-02-10, MRN [id number]  PCP:  Janith Lima, MD  Cardiologist:   Jenkins Rouge, MD   No chief complaint on file.      History of Present Illness: 86 y.o. history of PAC;s , CAD by calcium scoring 285 in 2016 HLD and CLL.  WBC typically in 25 range but no Rx needed since 2003.  Strong family history of premature CAD. Very active goes to [employer] and [employer]. Some arthritis in left knee limits activity Has had chronic ACL tear And sees Alusio   Golfing once/week with large group He taught sport and society at [employer] and knew [name] [place] who I took a course with    Calcium Score:  03/13/14 IMPRESSION: Coronary calcium score of 285. This was 10 st percentile for age and sex matched control.  Duplex 08/25/17 plaque no stenosis   Son lives in [place] does rafting tours Recently remarried  Daughter lmoved from [place] to [place] as her husband got a PHD math job there  Had atrial flutter in October 2021  Seen by Dr Quentin Ore [facility] on 12/16/19 with Dr Sallyanne Kuster Plan was to stop anticoagulation at end of month and monitor for any recurrence With ILR. Thought to be at increased risk of bleeding due to age and CLL  Moved to [place] with wife Uses their gym for cardio No chest pain     Past Medical History:  Diagnosis Date   Arthritis    Hyperlipidemia    Leukemia, chronic lymphocytic ([place])    SCCA (squamous cell carcinoma) of skin 08/08/2020   Mid Frontal Scalp (in situ) (curet and 5FU)   SCCA (squamous cell carcinoma) of skin 08/08/2020   Mid Parietal Scalp (in situ) (curet and 5FU)   SCCA (squamous cell carcinoma) of skin 08/08/2020   Mid Supratip of Nose (in situ)   Shingles    Squamous cell carcinoma of skin 12/08/2018   right jaw line cis   Vitamin D deficiency     Past Surgical  History:  Procedure Laterality Date   CARDIOVERSION N/A 12/16/2019   Procedure: CARDIOVERSION;  Surgeon: Sanda Klein, MD;  Location: [facility];  Service: Cardiovascular;  Laterality: N/A;   EYE SURGERY     LAPAROSCOPIC TRANS ANAL AND TRANSABDOMINAL RECTAL RESECTION WITH COLOANAL ANASTOMOSIS     SURGERY OF LIP     tumor   TONSILLECTOMY     VASECTOMY       Current Outpatient Medications  Medication Sig Dispense Refill   atorvastatin (LIPITOR) 20 MG tablet TAKE 1 TABLET(20 MG) BY MOUTH DAILY 90 tablet 0   betamethasone dipropionate 0.05 % cream Apply 1 application topically daily as needed (Dryness on Hand).      Multiple Vitamins-Minerals (CENTRUM SILVER PO) Take 1 tablet by mouth daily.      No current facility-administered medications for this visit.    Allergies:   Azithromycin    Social History:  The patient  reports that he has never smoked. He has never used smokeless tobacco. He reports that he does not drink alcohol and does not use drugs.   Family History:  The patient's family history includes Asthma in his sister; COPD in his maternal grandmother; Cancer in his mother; Colon cancer in his sister; Diabetes in his  paternal grandmother; Emphysema in his mother; Heart disease in his father; Hyperlipidemia in his sister; Mental illness in his paternal grandmother; Stroke in his maternal grandfather and sister.    ROS:  Please see the history of present illness.   Otherwise, review of systems are positive for none.   All other systems are reviewed and negative.    PHYSICAL EXAM: VS:  BP (!) 140/90   Pulse 62   Ht '5\' 11"'$  (1.803 m)   Wt 208 lb (94.3 kg)   SpO2 97%   BMI 29.01 kg/m  , BMI Body mass index is 29.01 kg/m. Affect appropriate Healthy:  appears stated age 53: normal Neck supple with no adenopathy JVP bilateral t  bruits no thyromegaly Lungs clear with no wheezing and good diaphragmatic motion Heart:  S1/S2 no murmur, no rub, gallop or click PMI  normal Abdomen: benighn, BS positve, no tenderness, no AAA no bruit.  No HSM or HJR Distal pulses intact with no bruits No edema Neuro non-focal Skin warm and dry No muscular weakness     EKG:   12/02/2021 NSR rate 58 normal    Recent Labs: 04/01/2021: TSH 6.22 11/25/2021: ALT 19; BUN 16; Creatinine 1.03; Hemoglobin 16.1; Platelet Count 150; Potassium 4.4; Sodium 138    Lipid Panel    Component Value Date/Time   CHOL 132 04/01/2021 0828   CHOL 136 02/17/2017 0832   TRIG 133.0 04/01/2021 0828   HDL 30.20 (L) 04/01/2021 0828   HDL 29 (L) 02/17/2017 0832   CHOLHDL 4 04/01/2021 0828   VLDL 26.6 04/01/2021 0828   LDLCALC 76 04/01/2021 0828   LDLCALC 82 02/17/2017 0832      Wt Readings from Last 3 Encounters:  12/02/21 208 lb (94.3 kg)  11/25/21 211 lb 4.8 oz (95.8 kg)  04/01/21 211 lb (95.7 kg)      Other studies Reviewed: Additional studies/ records that were reviewed today include: records Dr Everlene Farrier and [facility] office notes .    ASSESSMENT AND PLAN:  1. Family History CAD: Active and asymptomatic  Baseline ECG is normal . 2016 Calcium score 285 which is 61st percentile  Stable no agnina observe   2. Chol:  On statin LFTls ok  update labs  Lab Results  Component Value Date   LDLCALC 76 04/01/2021     3. CLL  WBC 24.1 slightly decreased no active Rx f/u oncology Dr Lorenso Courier will take Dr Magrinat's place next visit  4. Left bruit:   08/25/17 Duplex without significant stenosis ASA / Statin   Will updated [place] 5. Atrial Flutter:  Post [facility] 12/16/19 with ILR for surveillance anticoagulation d/c by Dr Quentin Ore Will need ablation or AAT if he has recurrence discovered by IL[place] with no flutter/afib 11/12/21   Current medicines are reviewed at length with the patient today.  The patient does not have concerns regarding medicines.  The following changes have been made:   None   Labs/ tests ordered today include:  carotid duplex and calcium score  Lipid and  Liver    No orders of the defined types were placed in this encounter.     Disposition:   FU with   me in a year     Signed, Jenkins Rouge, MD  12/02/2021 9:03 AM    [facility] [place], [place],   [address] Phone: [phone]; Fax: [phone]

## 2021-11-25 ENCOUNTER — Inpatient Hospital Stay: Payer: Medicare PPO | Admitting: Hematology and Oncology

## 2021-11-25 ENCOUNTER — Inpatient Hospital Stay: Payer: Medicare PPO | Attending: Hematology and Oncology

## 2021-11-25 ENCOUNTER — Other Ambulatory Visit: Payer: Self-pay | Admitting: Hematology and Oncology

## 2021-11-25 VITALS — BP 147/71 | HR 60 | Temp 97.5°F | Resp 14 | Wt 211.3 lb

## 2021-11-25 DIAGNOSIS — C9110 Chronic lymphocytic leukemia of B-cell type not having achieved remission: Secondary | ICD-10-CM

## 2021-11-25 DIAGNOSIS — Z79899 Other long term (current) drug therapy: Secondary | ICD-10-CM | POA: Diagnosis not present

## 2021-11-25 LAB — CMP (CANCER CENTER ONLY)
ALT: 19 U/L (ref 0–44)
AST: 21 U/L (ref 15–41)
Albumin: 3.6 g/dL (ref 3.5–5.0)
Alkaline Phosphatase: 71 U/L (ref 38–126)
Anion gap: 4 — ABNORMAL LOW (ref 5–15)
BUN: 16 mg/dL (ref 8–23)
CO2: 26 mmol/L (ref 22–32)
Calcium: 8.7 mg/dL — ABNORMAL LOW (ref 8.9–10.3)
Chloride: 108 mmol/L (ref 98–111)
Creatinine: 1.03 mg/dL (ref 0.61–1.24)
GFR, Estimated: >60 mL/min (ref >60)
Glucose, Bld: 120 mg/dL — ABNORMAL HIGH (ref 70–99)
Potassium: 4.4 mmol/L (ref 3.5–5.1)
Sodium: 138 mmol/L (ref 135–145)
Total Bilirubin: 0.7 mg/dL (ref 0.3–1.2)
Total Protein: 6.1 g/dL — ABNORMAL LOW (ref 6.5–8.1)

## 2021-11-25 LAB — CBC WITH DIFFERENTIAL (CANCER CENTER ONLY)
Abs Immature Granulocytes: 0.03 10*3/uL (ref 0.00–0.07)
Basophils Absolute: 0.1 10*3/uL (ref 0.0–0.1)
Basophils Relative: 0 %
Eosinophils Absolute: 0.3 10*3/uL (ref 0.0–0.5)
Eosinophils Relative: 1 %
HCT: 47.1 % (ref 39.0–52.0)
Hemoglobin: 16.1 g/dL (ref 13.0–17.0)
Immature Granulocytes: 0 %
Lymphocytes Relative: 82 %
Lymphs Abs: 18.4 10*3/uL — ABNORMAL HIGH (ref 0.7–4.0)
MCH: 31 pg (ref 26.0–34.0)
MCHC: 34.2 g/dL (ref 30.0–36.0)
MCV: 90.6 fL (ref 80.0–100.0)
Monocytes Absolute: 0.7 10*3/uL (ref 0.1–1.0)
Monocytes Relative: 3 %
Neutro Abs: 3.2 10*3/uL (ref 1.7–7.7)
Neutrophils Relative %: 14 %
Platelet Count: 150 10*3/uL (ref 150–400)
RBC: 5.2 MIL/uL (ref 4.22–5.81)
RDW: 12.9 % (ref 11.5–15.5)
Smear Review: NORMAL
WBC Count: 22.6 10*3/uL — ABNORMAL HIGH (ref 4.0–10.5)
nRBC: 0 % (ref 0.0–0.2)

## 2021-11-25 LAB — LACTATE DEHYDROGENASE: LDH: 129 U/L (ref 98–192)

## 2021-11-25 NOTE — Progress Notes (Signed)
[place] Telephone:([phone]   Fax:([phone]) [phone]  PROGRESS NOTE  Patient Care Team: Janith Lima, MD as PCP - General (Internal Medicine) Josue Hector, MD as Consulting Physician (Cardiology) Alphonsa Overall, MD as Consulting Physician (General Surgery) Belva Crome, MD as Consulting Physician (Cardiology) [name], MD (Inactive) as Consulting Physician (Dermatology) Vickie Epley, MD as Consulting Physician (Cardiology) Orson Slick, MD as Consulting Physician (Hematology and Oncology)  Hematological/Oncological History # CLL Rai Stage 0 05/05/1995: diagnosis of CLL 2001: treated with rituximab 2003: again treated with rituximab 11/25/2021: establish care with Francisco Larson. Transfer care from Francisco Larson  Interval History:  Francisco Larson 86 y.o. male with medical history significant for CLL who presents for a follow up visit. The patient's last visit was on 11/22/2020 with Francisco Larson. In the interim since the last visit he has had no major changes in his health.   On exam today Francisco Larson orts that he was cared for by Francisco Larson for 27 years.  He reports that he was initially diagnosed and did not undergo treatment until 2001.  At that time he was given rituximab therapy and unfortunate had a recurrence in 2003 and required a second dose.  He reports he has not required any further treatment since then.  He notes that he has been checking in with Francisco Larson once a year for the last 15 years.  He reports that he has not had any enlarged lymph nodes recently but has had some before in the past.  They have never been a large concern.  He also is not currently having any B symptoms such as fevers, chills, sweats, nausea, vomiting or diarrhea.  He reports he did have COVID last year but cleared the infection okay.  He is not prone to recurrent infections such as sinus infections, pneumonias, or urinary tract infections.  He reports that he  did have shingles in 2007 and does have some occasional sensitivity due to that.  He has received the more recent vaccines.  He otherwise reports he feels quite well.  He is at his baseline level of health.  Full 10 point ROS was otherwise negative.  MEDICAL HISTORY:  Past Medical History:  Diagnosis Date   Arthritis    Hyperlipidemia    Leukemia, chronic lymphocytic (H[facility])    SCCA (squamous cell carcinoma) of skin 08/08/2020   Mid Frontal Scalp (in situ) (curet and 5FU)   SCCA (squamous cell carcinoma) of skin 08/08/2020   Mid Parietal Scalp (in situ) (curet and 5FU)   SCCA (squamous cell carcinoma) of skin 08/08/2020   Mid Supratip of Nose (in situ)   Shingles    Squamous cell carcinoma of skin 12/08/2018   right jaw line cis   Vitamin D deficiency     SURGICAL HISTORY: Past Surgical History:  Procedure Laterality Date   CARDIOVERSION N/A 12/16/2019   Procedure: CARDIOVERSION;  Surgeon: Sanda Klein, MD;  Location: [facility];  Service: Cardiovascular;  Laterality: N/A;   EYE SURGERY     LAPAROSCOPIC TRANS ANAL AND TRANSABDOMINAL RECTAL RESECTION WITH COLOANAL ANASTOMOSIS     SURGERY OF LIP     tumor   TONSILLECTOMY     VASECTOMY      SOCIAL HISTORY: Social History   Socioeconomic History   Marital status: Married    Spouse name: Not on file   Number of children: 2   Years of education: Not on file   Highest education  level: Not on file  Occupational History   Occupation: Retired    Comment: Professor [employer]  Tobacco Use   Smoking status: Never   Smokeless tobacco: Never  Vaping Use   Vaping Use: Never used  Substance and Sexual Activity   Alcohol use: No   Drug use: No   Sexual activity: Not on file  Other Topics Concern   Not on file  Social History Narrative   Not on file   Social Determinants of Health   Financial Resource Strain: Low Risk  (07/22/2021)   Overall Financial Resource Strain (CARDIA)    Difficulty of Paying Living Expenses: Not hard  at all  Food Insecurity: No Food Insecurity (07/22/2021)   Hunger Vital Sign    Worried About Running Out of Food in the Last Year: Never true    Ran Out of Food in the Last Year: Never true  Transportation Needs: No Transportation Needs (07/22/2021)   PRAPARE - Hydrologist (Medical): No    Lack of Transportation (Non-Medical): No  Physical Activity: Sufficiently Active (07/22/2021)   Exercise Vital Sign    Days of Exercise per Week: 7 days    Minutes of Exercise per Session: 60 min  Stress: No Stress Concern Present (07/22/2021)   [place]    Feeling of Stress : Not at all  Social Connections: [place] (07/22/2021)   Social Connection and Isolation Panel [NHANES]    Frequency of Communication with Friends and Family: More than three times a week    Frequency of Social Gatherings with Friends and Family: More than three times a week    Attends Religious Services: More than 4 times per year    Active Member of [employer] or Organizations: Yes    Attends Music therapist: More than 4 times per year    Marital Status: Married  Human resources officer Violence: Not At Risk (07/22/2021)   Humiliation, Afraid, Rape, and Kick questionnaire    Fear of Current or Ex-Partner: No    Emotionally Abused: No    Physically Abused: No    Sexually Abused: No    FAMILY HISTORY: Family History  Problem Relation Age of Onset   Colon cancer Sister    Hyperlipidemia Sister    Stroke Sister    Heart disease Father    Emphysema Mother        never smoker, but exposed to 2nd hand from spouse   Cancer Mother    COPD Maternal Grandmother        never smoker   Stroke Maternal Grandfather    Diabetes Paternal Grandmother    Mental illness Paternal Grandmother    Asthma Sister     ALLERGIES:  is allergic to azithromycin.  MEDICATIONS:  Current Outpatient Medications  Medication Sig Dispense  Refill   atorvastatin (LIPITOR) 20 MG tablet TAKE 1 TABLET(20 MG) BY MOUTH DAILY 90 tablet 0   betamethasone dipropionate 0.05 % cream Apply 1 application topically daily as needed (Dryness on Hand).      Multiple Vitamins-Minerals (CENTRUM SILVER PO) Take 1 tablet by mouth daily.      No current facility-administered medications for this visit.    REVIEW OF SYSTEMS:   Constitutional: ( - ) fevers, ( - )  chills , ( - ) night sweats Eyes: ( - ) blurriness of vision, ( - ) double vision, ( - ) watery eyes Ears, nose, mouth, throat, and  face: ( - ) mucositis, ( - ) sore throat Respiratory: ( - ) cough, ( - ) dyspnea, ( - ) wheezes Cardiovascular: ( - ) palpitation, ( - ) chest discomfort, ( - ) lower extremity swelling Gastrointestinal:  ( - ) nausea, ( - ) heartburn, ( - ) change in bowel habits Skin: ( - ) abnormal skin rashes Lymphatics: ( - ) new lymphadenopathy, ( - ) easy bruising Neurological: ( - ) numbness, ( - ) tingling, ( - ) new weaknesses Behavioral/Psych: ( - ) mood change, ( - ) new changes  All other systems were reviewed with the patient and are negative.  PHYSICAL EXAMINATION: ECOG PERFORMANCE STATUS: 0 - Asymptomatic  Vitals:   11/25/21 0848  BP: (!) 147/71  Pulse: 60  Resp: 14  Temp: (!) 97.5 F (36.4 C)  SpO2: 97%   Filed Weights   11/25/21 0848  Weight: 211 lb 4.8 oz (95.8 kg)    GENERAL: Well-appearing elderly Caucasian male, appears younger than stated age.  Alert, no distress and comfortable SKIN: skin color, texture, turgor are normal, no rashes or significant lesions EYES: conjunctiva are pink and non-injected, sclera clear NECK: supple, non-tender LYMPH:  no palpable lymphadenopathy in the cervical, axillary or inguinal LUNGS: clear to auscultation and percussion with normal breathing effort HEART: regular rate & rhythm and no murmurs and no lower extremity edema Musculoskeletal: no cyanosis of digits and no clubbing  PSYCH: alert & oriented x  3, fluent speech NEURO: no focal motor/sensory deficits  LABORATORY DATA:  I have reviewed the data as listed    Latest Ref Rng & Units 11/25/2021    8:07 AM 04/01/2021    8:28 AM 11/15/2020    8:21 AM  CBC  WBC 4.0 - 10.5 K/uL 22.6  24.3 Repeated and verified X2.  24.1   Hemoglobin 13.0 - 17.0 g/dL 16.1  16.2  15.4   Hematocrit 39.0 - 52.0 % 47.1  48.1  44.6   Platelets 150 - 400 K/uL 150  166.0  157        Latest Ref Rng & Units 11/25/2021    8:07 AM 04/01/2021    8:28 AM 11/15/2020    8:21 AM  CMP  Glucose 70 - 99 mg/dL 120  113  115   BUN 8 - 23 mg/dL _0 Creatinine 0.61 - 1.24 mg/dL 1.03  1.16  0.93   Sodium 135 - 145 mmol/L 138  137  139   Potassium 3.5 - 5.1 mmol/L 4.4  4.7  4.4   Chloride 98 - 111 mmol/L 108  103  108   CO2 22 - 32 mmol/L _1 Calcium 8.9 - 10.3 mg/dL 8.7  9.1  8.6   Total Protein 6.5 - 8.1 g/dL 6.1  6.2  5.8   Total Bilirubin 0.3 - 1.2 mg/dL 0.7  0.7  0.7   Alkaline Phos 38 - 126 U/L 71  74  81   AST 15 - 41 U/L _2 ALT 0 - 44 U/L _3 No results found for: "MPROTEIN" Lab Results  Component Value Date   KPAFRELGTCHN 0.96 10/30/2009   KPAFRELGTCHN 0.88 02/27/2009   LAMBDASER 1.16 10/30/2009   LAMBDASER 0.88 02/27/2009   KAPLAMBRATIO 0.83 10/30/2009   KAPLAMBRATIO 1.00 02/27/2009     RADIOGRAPHIC STUDIES: CUP PACEART REMOTE DEVICE CHECK  Result Date:  11/12/2021 ILR summary report received. Battery status OK. Normal device function. No new symptom, tachy, brady, or pause episodes.   Monthly summary reports and ROV/PRN 4 brief AT/AF episodes per compass, <80mn,  no EGM for review, burden 0%, no OAC LA   ASSESSMENT & PLAN RJohnathan Hausen89y.o. male with medical history significant for CLL who presents for a follow up visit.  # CLL Rai Stage 0 --labs today show white blood cell 22.6, hemoglobin 16.1, MCV 90.6, and platelets of 150 --Patient has twice been treated with rituximab, once in 2001 and again  in 2003 --No clear indications for treatment at this time.  Indications for treatment as noted below. --RTC in 12 month time or sooner if he were to develop new or worsening symptoms.  No orders of the defined types were placed in this encounter.   All questions were answered. The patient knows to call the clinic with any problems, questions or concerns.  A total of more than 40 minutes were spent on this encounter with face-to-face time and non-face-to-face time, including preparing to see the patient, ordering tests and/or medications, counseling the patient and coordination of care as outlined above.   JLedell Peoples MD Department of Hematology/Oncology CWintonat WOzark HealthPhone: [phone]: [phone]: [email or web address]  11/25/2021 4:31 PM  HKristian CoveyBD, Catovsky D, Caligaris-Cappio F, Dighiero G, Dhner H, HGnadenhutten KWest Ishpeming MMoldovaE, Chiorazzi N, [place] Rai KR, BLavina Eichhorst B, O'Brien S, Robak T, Seymour JF, Kipps TJ. iwCLL guidelines for diagnosis, indications for treatment, response assessment, and supportive management of CLL. Blood. 2018 Jun 21;131(25):2745-2760.  Active disease should be clearly documented to initiate therapy. At least 1 of the following criteria should be met.  1) Evidence of progressive marrow failure as manifested by the development of, or worsening of, anemia and/or thrombocytopenia. Cutoff levels of Hb <10 g/dL or platelet counts <100  109/L are generally regarded as indication for treatment. However, in some patients, platelet counts <100  109/L may remain stable over a long period; this situation does not automatically require therapeutic intervention. 2) Massive (ie, ?6 cm below the left costal margin) or progressive or symptomatic splenomegaly. 3) Massive nodes (ie, ?10 cm in longest diameter) or progressive or symptomatic lymphadenopathy. 4) Progressive lymphocytosis  with an increase of ?50% over a 297-montheriod, or lymphocyte doubling time (LDT) <6 months. LDT can be obtained by linear regression extrapolation of absolute lymphocyte counts obtained at intervals of 2 weeks over an observation period of 2 to 3 months; patients with initial blood lymphocyte counts <30  109/L may require a longer observation period to determine the LDT. Factors contributing to lymphocytosis other than CLL (eg, infections, steroid administration) should be excluded. 5) Autoimmune complications including anemia or thrombocytopenia poorly responsive to corticosteroids. 6) Symptomatic or functional extranodal involvement (eg, skin, kidney, lung, spine). Disease-related symptoms as defined by any of the following: Unintentional weight loss ?10% within the previous 6 months. Significant fatigue (ie, ECOG performance scale 2 or worse; cannot work or unable to perform usual activities). Fevers ?100.38F or 38.0C for 2 or more weeks without evidence of infection. Night sweats for ?1 month without evidence of infection.

## 2021-11-26 ENCOUNTER — Encounter: Payer: Self-pay | Admitting: Podiatry

## 2021-11-26 ENCOUNTER — Ambulatory Visit: Payer: Medicare PPO | Admitting: Podiatry

## 2021-11-26 DIAGNOSIS — B351 Tinea unguium: Secondary | ICD-10-CM

## 2021-11-26 DIAGNOSIS — M79675 Pain in left toe(s): Secondary | ICD-10-CM | POA: Diagnosis not present

## 2021-11-26 DIAGNOSIS — L600 Ingrowing nail: Secondary | ICD-10-CM

## 2021-11-26 DIAGNOSIS — M79674 Pain in right toe(s): Secondary | ICD-10-CM | POA: Diagnosis not present

## 2021-12-01 NOTE — Progress Notes (Signed)
  Subjective:  Patient ID: Francisco Larson, male    DOB: 27-Jan-1935,  MRN: [id number]  Francisco Larson presents to clinic today for painful elongated mycotic toenails 1-5 bilaterally which are tender when wearing enclosed shoe gear. Pain is relieved with periodic professional debridement.  Chief Complaint  Patient presents with   Nail Problem    Nail Trim  Not Diabetic  PCP - Dr Ronnald Ramp , last OV March 2023   New problem(s): None.   PCP is Janith Lima, MD.  Allergies  Allergen Reactions   Azithromycin Other (See Comments) and Itching    Pt stated he thinks this made him dizzy Pt stated he thinks this made him dizzy    Review of Systems: Negative except as noted in the HPI.  Objective:   Francisco Larson is a pleasant 86 y.o. male WD, WN in NAD. AAO x 3. Vascular CFT immediate b/l LE. Palpable DP/PT pulses b/l LE. Digital hair sparse b/l. Skin temperature gradient WNL b/l. No pain with calf compression b/l. No edema noted b/l. No cyanosis or clubbing noted b/l LE.  Neurologic Normal speech. Oriented to person, place, and time. Protective sensation intact 5/5 intact bilaterally with 10g monofilament b/l.  Dermatologic Pedal skin warm and supple b/l.  No open wounds b/l. No interdigital macerations. Toenails 1-5 b/l elongated, thickened, discolored with subungual debris. +Tenderness with dorsal palpation of nailplates. No hyperkeratotic nor porokeratotic lesions noted b/l. Incurvated nailplate medial border of R 2nd toe with tenderness to palpation. No erythema, no edema, no drainage noted. No fluctuance.   Orthopedic: Muscle strength 5/5 to all lower extremity muscle groups bilaterally. No pain, crepitus or joint limitation noted with ROM bilateral LE. No gross bony deformities bilaterally. Patient ambulates independent of any assistive aids.   Radiographs: None Assessment/Plan: 1. Pain due to onychomycosis of toenails of both feet   2. Ingrown toenail without infection      No orders of the defined types were placed in this encounter.   -Consent given for treatment as described below: -Toenails 1-5 b/l were debrided in length and girth with sterile nail nippers and dremel without iatrogenic bleeding.  -No invasive procedure(s) performed. Offending nail border debrided and curretaged medial border of R 2nd toe utilizing sterile nail nipper and currette. Border(s) cleansed with alcohol and triple antibiotic ointment applied. Patient/POA/Caregiver/Facility instructed to apply Neosporin Cream  to right second digit once daily for 7 days. Call office if there are any concerns. -Patient/POA to call should there be question/concern in the interim.   Return in about 3 months (around 02/26/2022).  Marzetta Board, DPM

## 2021-12-02 ENCOUNTER — Ambulatory Visit: Payer: Medicare PPO | Attending: Cardiovascular Disease | Admitting: Cardiovascular Disease

## 2021-12-02 ENCOUNTER — Encounter: Payer: Self-pay | Admitting: Cardiovascular Disease

## 2021-12-02 VITALS — BP 140/90 | HR 62 | Ht 71.0 in | Wt 208.0 lb

## 2021-12-02 DIAGNOSIS — C9110 Chronic lymphocytic leukemia of B-cell type not having achieved remission: Secondary | ICD-10-CM

## 2021-12-02 DIAGNOSIS — I4819 Other persistent atrial fibrillation: Secondary | ICD-10-CM | POA: Diagnosis not present

## 2021-12-02 DIAGNOSIS — R0989 Other specified symptoms and signs involving the circulatory and respiratory systems: Secondary | ICD-10-CM | POA: Diagnosis not present

## 2021-12-02 NOTE — Patient Instructions (Signed)
Medication Instructions:  Your physician recommends that you continue on your current medications as directed. Please refer to the Current Medication list given to you today.  *If you need a refill on your cardiac medications before your next appointment, please call your pharmacy*  Lab Work: Your physician recommends that you have lab work today- Lipid and Liver panel  If you have labs (blood work) drawn today and your tests are completely normal, you will receive your results only by: MyChart Message (if you have MyChart) OR A paper copy in the mail If you have any lab test that is abnormal or we need to change your treatment, we will call you to review the results.  Testing/Procedures: Your physician has requested that you have a carotid duplex. This test is an ultrasound of the carotid arteries in your neck. It looks at blood flow through these arteries that supply the brain with blood. Allow one hour for this exam. There are no restrictions or special instructions.  Follow-Up: At [facility], you and your health needs are our priority.  As part of our continuing mission to provide you with exceptional heart care, we have created designated Provider Care Teams.  These Care Teams include your primary Cardiologist (physician) and Advanced Practice Providers (APPs -  Physician Assistants and Nurse Practitioners) who all work together to provide you with the care you need, when you need it.  We recommend signing up for the patient portal called "MyChart".  Sign up information is provided on this After Visit Summary.  MyChart is used to connect with patients for Virtual Visits (Telemedicine).  Patients are able to view lab/test results, encounter notes, upcoming appointments, etc.  Non-urgent messages can be sent to your provider as well.   To learn more about what you can do with MyChart, go to [email or web address].    Your next appointment:   12 month(s)  The format for your  next appointment:   In Person  Provider:   Jenkins Rouge, MD      Important Information About Sugar

## 2021-12-03 LAB — LIPID PANEL
Chol/HDL Ratio: 4.4 ratio (ref 0.0–5.0)
Cholesterol, Total: 141 mg/dL (ref 100–199)
HDL: 32 mg/dL — ABNORMAL LOW (ref >39)
LDL Chol Calc (NIH): 88 mg/dL (ref 0–99)
Triglycerides: 115 mg/dL (ref 0–149)
VLDL Cholesterol Cal: 21 mg/dL (ref 5–40)

## 2021-12-03 LAB — HEPATIC FUNCTION PANEL
ALT: 23 IU/L (ref 0–44)
AST: 24 IU/L (ref 0–40)
Albumin: 3.9 g/dL (ref 3.7–4.7)
Alkaline Phosphatase: 88 IU/L (ref 44–121)
Bilirubin Total: 0.5 mg/dL (ref 0.0–1.2)
Bilirubin, Direct: 0.15 mg/dL (ref 0.00–0.40)
Total Protein: 6.2 g/dL (ref 6.0–8.5)

## 2021-12-12 ENCOUNTER — Ambulatory Visit (HOSPITAL_COMMUNITY)
Admission: RE | Admit: 2021-12-12 | Discharge: 2021-12-12 | Disposition: A | Discharge status: Home / Self Care | Payer: Medicare PPO | Source: Ambulatory Visit | Attending: Cardiovascular Disease | Admitting: Cardiovascular Disease

## 2021-12-12 DIAGNOSIS — R0989 Other specified symptoms and signs involving the circulatory and respiratory systems: Secondary | ICD-10-CM | POA: Insufficient documentation

## 2021-12-12 DIAGNOSIS — I4819 Other persistent atrial fibrillation: Secondary | ICD-10-CM | POA: Insufficient documentation

## 2021-12-14 NOTE — Progress Notes (Signed)
Carelink Summary Report / Loop Recorder 

## 2021-12-16 ENCOUNTER — Ambulatory Visit (INDEPENDENT_AMBULATORY_CARE_PROVIDER_SITE_OTHER): Payer: Medicare PPO

## 2021-12-16 DIAGNOSIS — I484 Atypical atrial flutter: Secondary | ICD-10-CM | POA: Diagnosis not present

## 2021-12-17 LAB — CUP PACEART REMOTE DEVICE CHECK
Date Time Interrogation Session: 20231203231250
Implantable Pulse Generator Implant Date: 20211221

## 2021-12-27 DIAGNOSIS — H52203 Unspecified astigmatism, bilateral: Secondary | ICD-10-CM | POA: Diagnosis not present

## 2021-12-27 DIAGNOSIS — H04123 Dry eye syndrome of bilateral lacrimal glands: Secondary | ICD-10-CM | POA: Diagnosis not present

## 2021-12-27 DIAGNOSIS — H2513 Age-related nuclear cataract, bilateral: Secondary | ICD-10-CM | POA: Diagnosis not present

## 2021-12-30 ENCOUNTER — Ambulatory Visit: Payer: Medicare PPO | Admitting: Dermatology

## 2022-01-20 ENCOUNTER — Ambulatory Visit (INDEPENDENT_AMBULATORY_CARE_PROVIDER_SITE_OTHER): Payer: Medicare PPO

## 2022-01-20 DIAGNOSIS — I484 Atypical atrial flutter: Secondary | ICD-10-CM | POA: Diagnosis not present

## 2022-01-22 LAB — CUP PACEART REMOTE DEVICE CHECK
Date Time Interrogation Session: 20240107232212
Implantable Pulse Generator Implant Date: 20211221

## 2022-01-24 NOTE — Progress Notes (Signed)
Carelink Summary Report / Loop Recorder

## 2022-01-29 ENCOUNTER — Other Ambulatory Visit (HOSPITAL_BASED_OUTPATIENT_CLINIC_OR_DEPARTMENT_OTHER): Payer: Self-pay | Admitting: Cardiovascular Disease

## 2022-01-29 DIAGNOSIS — I779 Disorder of arteries and arterioles, unspecified: Secondary | ICD-10-CM

## 2022-02-24 ENCOUNTER — Ambulatory Visit: Payer: Medicare PPO

## 2022-02-24 DIAGNOSIS — I484 Atypical atrial flutter: Secondary | ICD-10-CM

## 2022-02-25 LAB — CUP PACEART REMOTE DEVICE CHECK
Date Time Interrogation Session: 20240209231208
Implantable Pulse Generator Implant Date: 20211221

## 2022-03-03 NOTE — Progress Notes (Signed)
Carelink Summary Report / Loop Recorder 

## 2022-03-10 ENCOUNTER — Ambulatory Visit: Payer: Medicare PPO | Admitting: Podiatry

## 2022-03-10 ENCOUNTER — Encounter: Payer: Self-pay | Admitting: Podiatry

## 2022-03-10 VITALS — BP 143/66

## 2022-03-10 DIAGNOSIS — M79675 Pain in left toe(s): Secondary | ICD-10-CM | POA: Diagnosis not present

## 2022-03-10 DIAGNOSIS — M79674 Pain in right toe(s): Secondary | ICD-10-CM

## 2022-03-10 DIAGNOSIS — B351 Tinea unguium: Secondary | ICD-10-CM | POA: Diagnosis not present

## 2022-03-10 NOTE — Progress Notes (Unsigned)
  Subjective:  Patient ID: Francisco Larson, male    DOB: 1935/07/10,  MRN: [id number]  Francisco Larson presents to clinic today for painful thick toenails that are difficult to trim. Pain interferes with ambulation. Aggravating factors include wearing enclosed shoe gear. Pain is relieved with periodic professional debridement.  Chief Complaint  Patient presents with   Nail Problem     [facility] PCP-Thomas Jones PCP VST-03/2021   New problem(s): None.   PCP is Janith Lima, MD.  Allergies  Allergen Reactions   Azithromycin Other (See Comments) and Itching    Pt stated he thinks this made him dizzy Pt stated he thinks this made him dizzy    Review of Systems: Negative except as noted in the HPI.  Objective: No changes noted in today's physical examination. Vitals:   03/10/22 1330  BP: (!) 143/66   Francisco Larson is a pleasant 87 y.o. male {[id number]} AAO x 3.  Vascular CFT immediate b/l LE. Palpable DP/PT pulses b/l LE. Digital hair sparse b/l. Skin temperature gradient WNL b/l. No pain with calf compression b/l. No edema noted b/l. No cyanosis or clubbing noted b/l LE.  Neurologic Normal speech. Oriented to person, place, and time. Protective sensation intact 5/5 intact bilaterally with 10g monofilament b/l.  Dermatologic Pedal skin warm and supple b/l.  No open wounds b/l. No interdigital macerations. Toenails 1-5 b/l elongated, thickened, discolored with subungual debris. +Tenderness with dorsal palpation of nailplates. No hyperkeratotic nor porokeratotic lesions noted b/l. Incurvated nailplate medial border of R 2nd toe with tenderness to palpation. No erythema, no edema, no drainage noted. No fluctuance.   Orthopedic: Muscle strength 5/5 to all lower extremity muscle groups bilaterally. No pain, crepitus or joint limitation noted with ROM bilateral LE. No gross bony deformities bilaterally. Patient ambulates independent of any assistive aids.   Radiographs:  None Assessment/Plan: No diagnosis found.  No orders of the defined types were placed in this encounter.   None -Patient was evaluated and treated. All patient's and/or POA's questions/concerns answered on today's visit. -Patient to continue soft, supportive shoe gear daily. -Mycotic toenails 1-5 bilaterally were debrided in length and girth with sterile nail nippers and dremel without incident. -Patient/POA to call should there be question/concern in the interim.   No follow-ups on file.  Marzetta Board, DPM

## 2022-03-31 ENCOUNTER — Ambulatory Visit (INDEPENDENT_AMBULATORY_CARE_PROVIDER_SITE_OTHER): Payer: Medicare PPO

## 2022-03-31 DIAGNOSIS — I4819 Other persistent atrial fibrillation: Secondary | ICD-10-CM | POA: Diagnosis not present

## 2022-04-02 LAB — CUP PACEART REMOTE DEVICE CHECK
Date Time Interrogation Session: 20240317231513
Implantable Pulse Generator Implant Date: 20211221

## 2022-04-09 ENCOUNTER — Telehealth: Payer: Self-pay

## 2022-04-09 ENCOUNTER — Encounter: Payer: Self-pay | Admitting: Internal Medicine

## 2022-04-09 ENCOUNTER — Ambulatory Visit (INDEPENDENT_AMBULATORY_CARE_PROVIDER_SITE_OTHER): Payer: Medicare PPO | Admitting: Internal Medicine

## 2022-04-09 VITALS — BP 132/86 | HR 65 | Temp 97.7°F | Ht 71.0 in | Wt 210.0 lb

## 2022-04-09 DIAGNOSIS — Z0000 Encounter for general adult medical examination without abnormal findings: Secondary | ICD-10-CM | POA: Diagnosis not present

## 2022-04-09 DIAGNOSIS — R7989 Other specified abnormal findings of blood chemistry: Secondary | ICD-10-CM

## 2022-04-09 DIAGNOSIS — R7303 Prediabetes: Secondary | ICD-10-CM

## 2022-04-09 DIAGNOSIS — N410 Acute prostatitis: Secondary | ICD-10-CM

## 2022-04-09 DIAGNOSIS — C9110 Chronic lymphocytic leukemia of B-cell type not having achieved remission: Secondary | ICD-10-CM

## 2022-04-09 DIAGNOSIS — N1831 Chronic kidney disease, stage 3a: Secondary | ICD-10-CM

## 2022-04-09 LAB — CBC WITH DIFFERENTIAL/PLATELET
Basophils Absolute: 0.1 10*3/uL (ref 0.0–0.1)
Basophils Relative: 0.2 % (ref 0.0–3.0)
Eosinophils Absolute: 0.2 10*3/uL (ref 0.0–0.7)
Eosinophils Relative: 1 % (ref 0.0–5.0)
HCT: 49.5 % (ref 39.0–52.0)
Hemoglobin: 16.5 g/dL (ref 13.0–17.0)
Lymphocytes Relative: 79.6 % — ABNORMAL HIGH (ref 12.0–46.0)
Lymphs Abs: 18.9 10*3/uL — ABNORMAL HIGH (ref 0.7–4.0)
MCHC: 33.4 g/dL (ref 30.0–36.0)
MCV: 91.4 fl (ref 78.0–100.0)
Monocytes Absolute: 0.6 10*3/uL (ref 0.1–1.0)
Monocytes Relative: 2.6 % — ABNORMAL LOW (ref 3.0–12.0)
Neutro Abs: 3.7 10*3/uL (ref 1.4–7.7)
Neutrophils Relative %: 15.9 % — ABNORMAL LOW (ref 43.0–77.0)
Platelets: 169 10*3/uL (ref 150.0–400.0)
RBC: 5.42 Mil/uL (ref 4.22–5.81)
RDW: 13.9 % (ref 11.5–15.5)
WBC: 23.5 10*3/uL (ref 4.0–10.5)

## 2022-04-09 LAB — URINALYSIS, ROUTINE W REFLEX MICROSCOPIC
Bilirubin Urine: NEGATIVE
Hgb urine dipstick: NEGATIVE
Ketones, ur: NEGATIVE
Nitrite: POSITIVE — AB
RBC / HPF: NONE SEEN (ref 0–?)
Specific Gravity, Urine: 1.02 (ref 1.000–1.030)
Total Protein, Urine: NEGATIVE
Urine Glucose: NEGATIVE
Urobilinogen, UA: 0.2 (ref 0.0–1.0)
pH: 6.5 (ref 5.0–8.0)

## 2022-04-09 LAB — BASIC METABOLIC PANEL
BUN: 18 mg/dL (ref 6–23)
CO2: 29 mEq/L (ref 19–32)
Calcium: 9.3 mg/dL (ref 8.4–10.5)
Chloride: 104 mEq/L (ref 96–112)
Creatinine, Ser: 1.08 mg/dL (ref 0.40–1.50)
GFR: 62.94 mL/min (ref >60.00)
Glucose, Bld: 110 mg/dL — ABNORMAL HIGH (ref 70–99)
Potassium: 4.6 mEq/L (ref 3.5–5.1)
Sodium: 139 mEq/L (ref 135–145)

## 2022-04-09 LAB — HEMOGLOBIN A1C: Hgb A1c MFr Bld: 6.1 % (ref 4.6–6.5)

## 2022-04-09 LAB — TSH: TSH: 4.35 u[IU]/mL (ref 0.35–5.50)

## 2022-04-09 MED ORDER — SULFAMETHOXAZOLE-TRIMETHOPRIM 800-160 MG PO TABS: 1.0000 | ORAL_TABLET | Freq: Two times a day (BID) | ORAL | 0 refills | Status: AC

## 2022-04-09 NOTE — Patient Instructions (Signed)
Health Maintenance, Male Adopting a healthy lifestyle and getting preventive care are important in promoting health and wellness. Ask your health care provider about: The right schedule for you to have regular tests and exams. Things you can do on your own to prevent diseases and keep yourself healthy. What should I know about diet, weight, and exercise? Eat a healthy diet  Eat a diet that includes plenty of vegetables, fruits, low-fat dairy products, and lean protein. Do not eat a lot of foods that are high in solid fats, added sugars, or sodium. Maintain a healthy weight Body mass index (BMI) is a measurement that can be used to identify possible weight problems. It estimates body fat based on height and weight. Your health care provider can help determine your BMI and help you achieve or maintain a healthy weight. Get regular exercise Get regular exercise. This is one of the most important things you can do for your health. Most adults should: Exercise for at least 150 minutes each week. The exercise should increase your heart rate and make you sweat (moderate-intensity exercise). Do strengthening exercises at least twice a week. This is in addition to the moderate-intensity exercise. Spend less time sitting. Even light physical activity can be beneficial. Watch cholesterol and blood lipids Have your blood tested for lipids and cholesterol at 85 years of age, then have this test every 5 years. You may need to have your cholesterol levels checked more often if: Your lipid or cholesterol levels are high. You are older than 85 years of age. You are at high risk for heart disease. What should I know about cancer screening? Many types of cancers can be detected early and may often be prevented. Depending on your health history and family history, you may need to have cancer screening at various ages. This may include screening for: Colorectal cancer. Prostate cancer. Skin cancer. Lung  cancer. What should I know about heart disease, diabetes, and high blood pressure? Blood pressure and heart disease High blood pressure causes heart disease and increases the risk of stroke. This is more likely to develop in people who have high blood pressure readings or are overweight. Talk with your health care provider about your target blood pressure readings. Have your blood pressure checked: Every 3-5 years if you are 18-39 years of age. Every year if you are 40 years old or older. If you are between the ages of 65 and 75 and are a current or former smoker, ask your health care provider if you should have a one-time screening for abdominal aortic aneurysm (AAA). Diabetes Have regular diabetes screenings. This checks your fasting blood sugar level. Have the screening done: Once every three years after age 45 if you are at a normal weight and have a low risk for diabetes. More often and at a younger age if you are overweight or have a high risk for diabetes. What should I know about preventing infection? Hepatitis B If you have a higher risk for hepatitis B, you should be screened for this virus. Talk with your health care provider to find out if you are at risk for hepatitis B infection. Hepatitis C Blood testing is recommended for: Everyone born from 1945 through 1965. Anyone with known risk factors for hepatitis C. Sexually transmitted infections (STIs) You should be screened each year for STIs, including gonorrhea and chlamydia, if: You are sexually active and are younger than 85 years of age. You are older than 85 years of age and your   health care provider tells you that you are at risk for this type of infection. Your sexual activity has changed since you were last screened, and you are at increased risk for chlamydia or gonorrhea. Ask your health care provider if you are at risk. Ask your health care provider about whether you are at high risk for HIV. Your health care provider  may recommend a prescription medicine to help prevent HIV infection. If you choose to take medicine to prevent HIV, you should first get tested for HIV. You should then be tested every 3 months for as long as you are taking the medicine. Follow these instructions at home: Alcohol use Do not drink alcohol if your health care provider tells you not to drink. If you drink alcohol: Limit how much you have to 0-2 drinks a day. Know how much alcohol is in your drink. In the [place] one drink equals one 12 oz bottle of beer (355 mL), one 5 oz glass of wine (148 mL), or one 1 oz glass of hard liquor (44 mL). Lifestyle Do not use any products that contain nicotine or tobacco. These products include cigarettes, chewing tobacco, and vaping devices, such as e-cigarettes. If you need help quitting, ask your health care provider. Do not use street drugs. Do not share needles. Ask your health care provider for help if you need support or information about quitting drugs. General instructions Schedule regular health, dental, and eye exams. Stay current with your vaccines. Tell your health care provider if: You often feel depressed. You have ever been abused or do not feel safe at home. Summary Adopting a healthy lifestyle and getting preventive care are important in promoting health and wellness. Follow your health care provider's instructions about healthy diet, exercising, and getting tested or screened for diseases. Follow your health care provider's instructions on monitoring your cholesterol and blood pressure. This information is not intended to replace advice given to you by your health care provider. Make sure you discuss any questions you have with your health care provider. Document Revised: 05/21/2020 Document Reviewed: 05/21/2020 Elsevier Patient Education  2023 Elsevier Inc.  

## 2022-04-09 NOTE — Progress Notes (Signed)
Subjective:  Patient ID: Francisco Larson, male    DOB: 02-10-1935  Age: 87 y.o. MRN: [id number]  CC: Annual Exam   HPI [place] OTTMAR presents for a CPX and f/up --  He developed constipation about 3 months ago and then he used a stool softener and had a few watery bowel movements.  He says more recently his stools have been normal.  He denies abdominal pain, nausea, vomiting, melena, or bright red blood per rectum.  He has had intermittent hematuria over the last 6 months.  Outpatient Medications Prior to Visit  Medication Sig Dispense Refill   atorvastatin (LIPITOR) 20 MG tablet TAKE 1 TABLET(20 MG) BY MOUTH DAILY 90 tablet 0   betamethasone dipropionate 0.05 % cream Apply 1 application topically daily as needed (Dryness on Hand).      Multiple Vitamins-Minerals (CENTRUM SILVER PO) Take 1 tablet by mouth daily.      No facility-administered medications prior to visit.    ROS Review of Systems  Constitutional: Negative.  Negative for chills, diaphoresis, fatigue and fever.  HENT: Negative.    Eyes: Negative.   Respiratory: Negative.  Negative for cough, chest tightness, shortness of breath and wheezing.   Cardiovascular:  Negative for chest pain, palpitations and leg swelling.  Gastrointestinal:  Positive for constipation and diarrhea. Negative for abdominal pain, anal bleeding, blood in stool, nausea and vomiting.  Endocrine: Negative.   Genitourinary:  Positive for dysuria and hematuria. Negative for decreased urine volume, difficulty urinating, flank pain, frequency, scrotal swelling, testicular pain and urgency.  Musculoskeletal: Negative.   Skin: Negative.   Neurological: Negative.  Negative for dizziness and weakness.  Hematological:  Negative for adenopathy. Does not bruise/bleed easily.  Psychiatric/Behavioral: Negative.      Objective:  BP 132/86 (BP Location: Left Arm, Patient Position: Sitting, Cuff Size: Large)   Pulse 65   Temp 97.7 F (36.5 C) (Oral)    Ht 5\' 11"  (1.803 m)   Wt 210 lb (95.3 kg)   SpO2 98%   BMI 29.29 kg/m   BP Readings from Last 3 Encounters:  04/09/22 132/86  03/10/22 (!) 143/66  12/02/21 (!) 140/90    Wt Readings from Last 3 Encounters:  04/09/22 210 lb (95.3 kg)  12/02/21 208 lb (94.3 kg)  11/25/21 211 lb 4.8 oz (95.8 kg)    Physical Exam Vitals reviewed.  HENT:     Nose: Nose normal.     Mouth/Throat:     Mouth: Mucous membranes are moist.  Eyes:     General: No scleral icterus.    Conjunctiva/sclera: Conjunctivae normal.  Cardiovascular:     Rate and Rhythm: Normal rate and regular rhythm.     Heart sounds: Murmur heard.     Systolic murmur is present with a grade of 2/6.     No friction rub. No gallop.  Pulmonary:     Effort: Pulmonary effort is normal. No respiratory distress.     Breath sounds: No stridor. No wheezing, rhonchi or rales.  Abdominal:     General: Abdomen is flat.     Palpations: There is no mass.     Tenderness: There is no abdominal tenderness. There is no guarding or rebound.     Hernia: No hernia is present. There is no hernia in the left inguinal area or right inguinal area.  Genitourinary:    Pubic Area: No rash.      Penis: Normal and circumcised.      Testes: Normal.  Epididymis:     Right: Normal.     Left: Normal.     Prostate: Enlarged. Not tender and no nodules present.     Rectum: Normal. Guaiac result negative. No mass, anal fissure, external hemorrhoid or internal hemorrhoid. Tenderness: boggy.Normal anal tone.  Musculoskeletal:     Cervical back: Neck supple.     Right lower leg: No edema.     Left lower leg: No edema.  Lymphadenopathy:     Cervical: No cervical adenopathy.     Lower Body: No right inguinal adenopathy. No left inguinal adenopathy.  Skin:    General: Skin is warm and dry.     Coloration: Skin is not pale.  Neurological:     General: No focal deficit present.     Mental Status: He is alert. Mental status is at baseline.   Psychiatric:        Mood and Affect: Mood normal.        Behavior: Behavior normal.     Lab Results  Component Value Date   WBC 23.5 Repeated and verified X2. (HH) 04/09/2022   HGB 16.5 04/09/2022   HCT 49.5 04/09/2022   PLT 169.0 04/09/2022   GLUCOSE 110 (H) 04/09/2022   CHOL 141 12/02/2021   TRIG 115 12/02/2021   HDL 32 (L) 12/02/2021   LDLCALC 88 12/02/2021   ALT 23 12/02/2021   AST 24 12/02/2021   NA 139 04/09/2022   K 4.6 04/09/2022   CL 104 04/09/2022   CREATININE 1.08 04/09/2022   BUN 18 04/09/2022   CO2 29 04/09/2022   TSH 4.35 04/09/2022   PSA 3.38 03/28/2019   HGBA1C 6.1 04/09/2022    VAS US CAROTID  Result Date: 12/12/2021 Carotid Arterial Duplex Study Patient Name:  Francisco Larson  Date of Exam:   12/12/2021 Medical Rec #: [id number]          Accession #:    [id number] Date of Birth: 1935-08-12          Patient Gender: M Patient Age:   50 years Exam Location:  [facility] Procedure:      VAS US CAROTID Referring Phys: Jenkins Rouge --------------------------------------------------------------------------------  Indications:       Carotid artery disease and Patient denies any cerebrovascular                    symptoms today. Risk Factors:      Hyperlipidemia, no history of smoking, coronary artery                    disease. Comparison Study:  Prior carotid duplex exam on 12/10/2020 showed highest                    velocities in right mid ICA 78/17 cm/s and left proximal ICA                    44/12 cm/s. Performing Technologist: Salvadore Dom RVT, RDCS (AE), RDMS  Examination Guidelines: A complete evaluation includes B-mode imaging, spectral Doppler, color Doppler, and power Doppler as needed of all accessible portions of each vessel. Bilateral testing is considered an integral part of a complete examination. Limited examinations for reoccurring indications may be performed as noted.  Right Carotid Findings:  +----------+--------+--------+--------+--------------------+------------------+           PSV cm/sEDV cm/sStenosisPlaque Description  Comments           +----------+--------+--------+--------+--------------------+------------------+ CCA Prox  70  9                                   intimal thickening +----------+--------+--------+--------+--------------------+------------------+ CCA Distal67      10              calcific and diffuseintimal thickening +----------+--------+--------+--------+--------------------+------------------+ ICA Prox  69      18      1-39%   diffuse and calcific                   +----------+--------+--------+--------+--------------------+------------------+ ICA Mid   62      15                                                     +----------+--------+--------+--------+--------------------+------------------+ ICA Distal68      19                                                     +----------+--------+--------+--------+--------------------+------------------+ ECA       128     10              diffuse and calcific                   +----------+--------+--------+--------+--------------------+------------------+ +----------+--------+-------+----------------+-------------------+           PSV cm/sEDV cmsDescribe        Arm Pressure (mmHG) +----------+--------+-------+----------------+-------------------+ [id number]            Multiphasic, [id number]                 +----------+--------+-------+----------------+-------------------+ +---------+--------+--+--------+-+---------+ VertebralPSV cm/s31EDV cm/s8Antegrade +---------+--------+--+--------+-+---------+  Left Carotid Findings: +----------+--------+--------+--------+------------------+------------------+           PSV cm/sEDV cm/sStenosisPlaque DescriptionComments           +----------+--------+--------+--------+------------------+------------------+ CCA Prox  106      11                                                   +----------+--------+--------+--------+------------------+------------------+ CCA Distal61      10              focal and calcificintimal thickening +----------+--------+--------+--------+------------------+------------------+ ICA Prox  61      15              focal and calcificintimal thickening +----------+--------+--------+--------+------------------+------------------+ ICA Mid   68      18      1-39%                                        +----------+--------+--------+--------+------------------+------------------+ ICA Distal58      18                                                   +----------+--------+--------+--------+------------------+------------------+ ECA  89      7               focal and calcific                   +----------+--------+--------+--------+------------------+------------------+ +----------+--------+--------+----------------+-------------------+           PSV cm/sEDV cm/sDescribe        Arm Pressure (mmHG) +----------+--------+--------+----------------+-------------------+ [id number]             Multiphasic, [id number]                 +----------+--------+--------+----------------+-------------------+ +---------+--------+--+--------+-+---------+ VertebralPSV cm/s45EDV cm/s6Antegrade +---------+--------+--+--------+-+---------+   Summary: Right Carotid: Velocities in the right ICA are consistent with a 1-39% stenosis. Left Carotid: Velocities in the left ICA are consistent with a 1-39% stenosis. Vertebrals:  Bilateral vertebral arteries demonstrate antegrade flow. Subclavians: Normal flow hemodynamics were seen in bilateral subclavian              arteries. *See table(s) above for measurements and observations. Suggest follow up study in 12 month due to plaque burden. Electronically signed by Jenkins Rouge MD on 12/12/2021 at 2:55:05 PM.    Final     Assessment & Plan:   Prediabetes- His A1c is 6.1%. -     Basic metabolic panel; Future -     Hemoglobin A1c; Future  Routine general medical examination at a health care facility- Exam completed, labs reviewed, vaccines reviewed, no cancer screenings indicated, patient education was given.  TSH elevation- He has subclinical hypothyroidism. -     TSH; Future  Stage 3a chronic kidney disease ([place])- His renal function is stable. -     Basic metabolic panel; Future -     Urinalysis, Routine w reflex microscopic; Future  CLL (chronic lymphocytic leukemia) ([place])- His white blood cell count is stable. -     CBC with Differential/Platelet; Future  Prostatitis, acute -     Sulfamethoxazole-Trimethoprim; Take 1 tablet by mouth 2 (two) times daily.  Dispense: 60 tablet; Refill: 0     Follow-up: Return in about 6 months (around 10/10/2022).  Scarlette Calico, MD

## 2022-04-09 NOTE — Telephone Encounter (Signed)
Critical call from lab (contact: Shari Prows.) on patient WBC 23.5. Provider made aware

## 2022-04-10 NOTE — Progress Notes (Signed)
Carelink Summary Report / Loop Recorder 

## 2022-04-15 DIAGNOSIS — D485 Neoplasm of uncertain behavior of skin: Secondary | ICD-10-CM | POA: Diagnosis not present

## 2022-04-15 DIAGNOSIS — D1801 Hemangioma of skin and subcutaneous tissue: Secondary | ICD-10-CM | POA: Diagnosis not present

## 2022-04-15 DIAGNOSIS — L308 Other specified dermatitis: Secondary | ICD-10-CM | POA: Diagnosis not present

## 2022-04-15 DIAGNOSIS — C44629 Squamous cell carcinoma of skin of left upper limb, including shoulder: Secondary | ICD-10-CM | POA: Diagnosis not present

## 2022-04-15 DIAGNOSIS — L570 Actinic keratosis: Secondary | ICD-10-CM | POA: Diagnosis not present

## 2022-04-15 DIAGNOSIS — L814 Other melanin hyperpigmentation: Secondary | ICD-10-CM | POA: Diagnosis not present

## 2022-04-15 DIAGNOSIS — L821 Other seborrheic keratosis: Secondary | ICD-10-CM | POA: Diagnosis not present

## 2022-04-15 DIAGNOSIS — D225 Melanocytic nevi of trunk: Secondary | ICD-10-CM | POA: Diagnosis not present

## 2022-04-15 DIAGNOSIS — Z85828 Personal history of other malignant neoplasm of skin: Secondary | ICD-10-CM | POA: Diagnosis not present

## 2022-04-17 ENCOUNTER — Telehealth: Payer: Self-pay | Admitting: Cardiovascular Disease

## 2022-04-17 ENCOUNTER — Other Ambulatory Visit: Payer: Self-pay | Admitting: Cardiovascular Disease

## 2022-04-17 MED ORDER — ATORVASTATIN CALCIUM 20 MG PO TABS: ORAL_TABLET | ORAL | 2 refills | Status: DC

## 2022-04-17 NOTE — Telephone Encounter (Signed)
*  STAT* If patient is at the pharmacy, call can be transferred to refill team.   1. Which medications need to be refilled? (please list name of each medication and dose if known) atorvastatin (LIPITOR) 20 MG tablet   2. Which pharmacy/location (including street and city if local pharmacy) is medication to be sent to?  [id number] - [place], [place] - [address]    3. Do they need a 30 day or 90 day supply? 90 day

## 2022-04-17 NOTE — Telephone Encounter (Signed)
Pt's medication was sent to pt's pharmacy as requested. Confirmation received.  °

## 2022-04-24 DIAGNOSIS — Z85828 Personal history of other malignant neoplasm of skin: Secondary | ICD-10-CM | POA: Diagnosis not present

## 2022-04-24 DIAGNOSIS — C44629 Squamous cell carcinoma of skin of left upper limb, including shoulder: Secondary | ICD-10-CM | POA: Diagnosis not present

## 2022-05-05 ENCOUNTER — Ambulatory Visit: Payer: Medicare PPO

## 2022-05-05 LAB — CUP PACEART REMOTE DEVICE CHECK
Date Time Interrogation Session: 20240419230532
Implantable Pulse Generator Implant Date: 20211221

## 2022-05-07 ENCOUNTER — Ambulatory Visit (INDEPENDENT_AMBULATORY_CARE_PROVIDER_SITE_OTHER): Payer: Medicare PPO

## 2022-05-07 DIAGNOSIS — I4892 Unspecified atrial flutter: Secondary | ICD-10-CM | POA: Diagnosis not present

## 2022-05-13 NOTE — Progress Notes (Signed)
Carelink Summary Report / Loop Recorder 

## 2022-06-03 NOTE — Progress Notes (Signed)
Carelink Summary Report / Loop Recorder 

## 2022-06-05 LAB — CUP PACEART REMOTE DEVICE CHECK
Date Time Interrogation Session: 20240522230606
Implantable Pulse Generator Implant Date: 20211221

## 2022-06-10 ENCOUNTER — Ambulatory Visit (INDEPENDENT_AMBULATORY_CARE_PROVIDER_SITE_OTHER): Payer: Medicare PPO

## 2022-06-10 DIAGNOSIS — I4892 Unspecified atrial flutter: Secondary | ICD-10-CM | POA: Diagnosis not present

## 2022-06-25 ENCOUNTER — Ambulatory Visit: Payer: Medicare PPO | Admitting: Podiatry

## 2022-06-25 VITALS — BP 140/69 | HR 60

## 2022-06-25 DIAGNOSIS — B351 Tinea unguium: Secondary | ICD-10-CM | POA: Diagnosis not present

## 2022-06-25 DIAGNOSIS — M79675 Pain in left toe(s): Secondary | ICD-10-CM | POA: Diagnosis not present

## 2022-06-25 DIAGNOSIS — M79674 Pain in right toe(s): Secondary | ICD-10-CM | POA: Diagnosis not present

## 2022-06-26 ENCOUNTER — Emergency Department (HOSPITAL_COMMUNITY): Payer: Medicare PPO

## 2022-06-26 ENCOUNTER — Inpatient Hospital Stay (HOSPITAL_COMMUNITY)
Admission: EM | Admit: 2022-06-26 | Discharge: 2022-06-28 | DRG: 322 | Disposition: A | Discharge status: Home / Self Care | Payer: Medicare PPO | Attending: Family Medicine | Admitting: Family Medicine

## 2022-06-26 ENCOUNTER — Other Ambulatory Visit: Payer: Self-pay

## 2022-06-26 ENCOUNTER — Ambulatory Visit (HOSPITAL_COMMUNITY)
Admission: EM | Admit: 2022-06-26 | Discharge: 2022-06-26 | Disposition: A | Discharge status: Home / Self Care | Payer: Medicare PPO | Attending: Internal Medicine | Admitting: Internal Medicine

## 2022-06-26 ENCOUNTER — Encounter (HOSPITAL_COMMUNITY): Payer: Self-pay

## 2022-06-26 DIAGNOSIS — I440 Atrioventricular block, first degree: Secondary | ICD-10-CM | POA: Diagnosis present

## 2022-06-26 DIAGNOSIS — I25110 Atherosclerotic heart disease of native coronary artery with unstable angina pectoris: Secondary | ICD-10-CM | POA: Diagnosis present

## 2022-06-26 DIAGNOSIS — E785 Hyperlipidemia, unspecified: Secondary | ICD-10-CM | POA: Diagnosis present

## 2022-06-26 DIAGNOSIS — Z833 Family history of diabetes mellitus: Secondary | ICD-10-CM

## 2022-06-26 DIAGNOSIS — E782 Mixed hyperlipidemia: Secondary | ICD-10-CM | POA: Diagnosis not present

## 2022-06-26 DIAGNOSIS — R0789 Other chest pain: Secondary | ICD-10-CM | POA: Diagnosis not present

## 2022-06-26 DIAGNOSIS — Z881 Allergy status to other antibiotic agents status: Secondary | ICD-10-CM

## 2022-06-26 DIAGNOSIS — Z8249 Family history of ischemic heart disease and other diseases of the circulatory system: Secondary | ICD-10-CM

## 2022-06-26 DIAGNOSIS — R7303 Prediabetes: Secondary | ICD-10-CM | POA: Diagnosis present

## 2022-06-26 DIAGNOSIS — I4892 Unspecified atrial flutter: Secondary | ICD-10-CM | POA: Diagnosis present

## 2022-06-26 DIAGNOSIS — I10 Essential (primary) hypertension: Secondary | ICD-10-CM | POA: Diagnosis present

## 2022-06-26 DIAGNOSIS — Z85828 Personal history of other malignant neoplasm of skin: Secondary | ICD-10-CM | POA: Diagnosis not present

## 2022-06-26 DIAGNOSIS — I249 Acute ischemic heart disease, unspecified: Secondary | ICD-10-CM | POA: Diagnosis present

## 2022-06-26 DIAGNOSIS — Z884 Allergy status to anesthetic agent status: Secondary | ICD-10-CM | POA: Diagnosis not present

## 2022-06-26 DIAGNOSIS — Z823 Family history of stroke: Secondary | ICD-10-CM | POA: Diagnosis not present

## 2022-06-26 DIAGNOSIS — Z83438 Family history of other disorder of lipoprotein metabolism and other lipidemia: Secondary | ICD-10-CM

## 2022-06-26 DIAGNOSIS — Z79899 Other long term (current) drug therapy: Secondary | ICD-10-CM

## 2022-06-26 DIAGNOSIS — Z9889 Other specified postprocedural states: Secondary | ICD-10-CM

## 2022-06-26 DIAGNOSIS — C9110 Chronic lymphocytic leukemia of B-cell type not having achieved remission: Secondary | ICD-10-CM

## 2022-06-26 DIAGNOSIS — Z825 Family history of asthma and other chronic lower respiratory diseases: Secondary | ICD-10-CM

## 2022-06-26 DIAGNOSIS — I495 Sick sinus syndrome: Secondary | ICD-10-CM | POA: Diagnosis present

## 2022-06-26 DIAGNOSIS — I160 Hypertensive urgency: Secondary | ICD-10-CM | POA: Diagnosis present

## 2022-06-26 DIAGNOSIS — I200 Unstable angina: Secondary | ICD-10-CM | POA: Diagnosis not present

## 2022-06-26 DIAGNOSIS — Z950 Presence of cardiac pacemaker: Secondary | ICD-10-CM

## 2022-06-26 DIAGNOSIS — R079 Chest pain, unspecified: Secondary | ICD-10-CM | POA: Diagnosis present

## 2022-06-26 DIAGNOSIS — I480 Paroxysmal atrial fibrillation: Secondary | ICD-10-CM | POA: Diagnosis present

## 2022-06-26 DIAGNOSIS — Z955 Presence of coronary angioplasty implant and graft: Secondary | ICD-10-CM | POA: Diagnosis not present

## 2022-06-26 DIAGNOSIS — R072 Precordial pain: Secondary | ICD-10-CM | POA: Diagnosis not present

## 2022-06-26 DIAGNOSIS — R7989 Other specified abnormal findings of blood chemistry: Secondary | ICD-10-CM

## 2022-06-26 DIAGNOSIS — J9811 Atelectasis: Secondary | ICD-10-CM | POA: Diagnosis not present

## 2022-06-26 DIAGNOSIS — Z800 Family history of malignant neoplasm of digestive organs: Secondary | ICD-10-CM

## 2022-06-26 DIAGNOSIS — I214 Non-ST elevation (NSTEMI) myocardial infarction: Principal | ICD-10-CM | POA: Diagnosis present

## 2022-06-26 DIAGNOSIS — N400 Enlarged prostate without lower urinary tract symptoms: Secondary | ICD-10-CM | POA: Diagnosis present

## 2022-06-26 DIAGNOSIS — I2510 Atherosclerotic heart disease of native coronary artery without angina pectoris: Secondary | ICD-10-CM | POA: Diagnosis not present

## 2022-06-26 LAB — BASIC METABOLIC PANEL
Anion gap: 10 (ref 5–15)
BUN: 20 mg/dL (ref 8–23)
CO2: 24 mmol/L (ref 22–32)
Calcium: 9.2 mg/dL (ref 8.9–10.3)
Chloride: 104 mmol/L (ref 98–111)
Creatinine, Ser: 1.28 mg/dL — ABNORMAL HIGH (ref 0.61–1.24)
GFR, Estimated: 55 mL/min — ABNORMAL LOW (ref >60)
Glucose, Bld: 112 mg/dL — ABNORMAL HIGH (ref 70–99)
Potassium: 4.5 mmol/L (ref 3.5–5.1)
Sodium: 138 mmol/L (ref 135–145)

## 2022-06-26 LAB — CBC
HCT: 50 % (ref 39.0–52.0)
Hemoglobin: 16.8 g/dL (ref 13.0–17.0)
MCH: 30.9 pg (ref 26.0–34.0)
MCHC: 33.6 g/dL (ref 30.0–36.0)
MCV: 91.9 fL (ref 80.0–100.0)
Platelets: 208 10*3/uL (ref 150–400)
RBC: 5.44 MIL/uL (ref 4.22–5.81)
RDW: 13.2 % (ref 11.5–15.5)
WBC: 32.4 10*3/uL — ABNORMAL HIGH (ref 4.0–10.5)
nRBC: 0.2 % (ref 0.0–0.2)

## 2022-06-26 LAB — TROPONIN I (HIGH SENSITIVITY)
Troponin I (High Sensitivity): 26 ng/L — ABNORMAL HIGH (ref <18)
Troponin I (High Sensitivity): 58 ng/L — ABNORMAL HIGH (ref <18)

## 2022-06-26 MED ORDER — ACETAMINOPHEN 650 MG RE SUPP: 650.0000 mg | Freq: Four times a day (QID) | RECTAL | Status: DC | PRN

## 2022-06-26 MED ORDER — ASPIRIN 81 MG PO CHEW
324.0000 mg | CHEWABLE_TABLET | Freq: Once | ORAL | Status: AC
  Administered 2022-06-26: 324 mg via ORAL
  Filled 2022-06-26: qty 4

## 2022-06-26 MED ORDER — ONDANSETRON HCL 4 MG/2ML IJ SOLN: 4.0000 mg | Freq: Four times a day (QID) | INTRAMUSCULAR | Status: DC | PRN

## 2022-06-26 MED ORDER — ACETAMINOPHEN 325 MG PO TABS: 650.0000 mg | ORAL_TABLET | Freq: Four times a day (QID) | ORAL | Status: DC | PRN

## 2022-06-26 MED ORDER — NITROGLYCERIN 0.4 MG SL SUBL: 0.4000 mg | SUBLINGUAL_TABLET | SUBLINGUAL | Status: DC | PRN

## 2022-06-26 MED ORDER — LABETALOL HCL 5 MG/ML IV SOLN
10.0000 mg | Freq: Once | INTRAVENOUS | Status: AC
  Administered 2022-06-26: 10 mg via INTRAVENOUS
  Filled 2022-06-26: qty 4

## 2022-06-26 MED ORDER — MELATONIN 3 MG PO TABS
3.0000 mg | ORAL_TABLET | Freq: Every evening | ORAL | Status: DC | PRN
  Administered 2022-06-27 (×2): 3 mg via ORAL
  Filled 2022-06-26 (×2): qty 1

## 2022-06-26 NOTE — ED Triage Notes (Signed)
Pt sent by UC for chest, nausea and SOB at end of stationary bike workout today. Pt c/o epigastric pain

## 2022-06-26 NOTE — ED Notes (Signed)
Blue top blood tube downstairs

## 2022-06-26 NOTE — ED Provider Notes (Signed)
Patient presents to urgent care for evaluation of chest pressure that started this morning after approximately 50 minutes of cardiovascular exercise (around 9:30am this morning). Pressure gradually worsened as he walked back to his town home and he became nauseous. The chest pain fully resolved after he took a shower at home but returned at approximately 3:30pm today 6 hours later. When chest pain came back, he had one episode of nausea/vomiting.  Chest pressure comes and goes. At its worse, he states the "pressure" was an 8 out of 10. He is currently reporting 1-2 out of 10 chest pressure. He is not currently nauseous and has not had any more vomiting episodes. No dizziness today. No recent falls. No triggering or relieving factors for pain/discomfort. No recent medication changes. Denies shortness of breath and heart palpitations.  States he had a [employer] "heart monitor" placed 2 years ago. Heart monitor is not a pacemaker or ICD. History of GERD, states he ate onion rings today and wonders if this could be contributing. No headache, vision changes, abdominal pain, or recent viral URI symptoms.   Heart rate is WNL, however irregular to auscultation. Lungs clear. He is overall well appearing. BP elevated at 211/115. Heart rate stable in the 60s. Well appearing and neurologically intact to baseline. +2 bilateral radial pulses.   Recommend workup in the ER for new onset chest discomfort as patient is high risk for cardiac etiology of chest discomfort.  EKG is without ST/T wave changes.  Vital signs are hemodynamically stable. Discussed recommendations with patient and his wife at the bedside who express understanding and agreement with plan.  Patient discharged from urgent care in stable condition.   Carlisle Beers, [place] 06/26/22 1859

## 2022-06-26 NOTE — ED Provider Notes (Addendum)
[place] EMERGENCY DEPARTMENT AT [facility] Provider Note  CSN: [id number] Arrival date & time: 06/26/22 1831  Chief Complaint(s) Abnormal Lab  HPI Francisco Larson is a 87 y.o. male with history of CLL presenting to the emergency department with chest pain.  Patient reports earlier this morning, he was exercising when he developed chest pain described as a pressure.  He reports he felt nauseous.  Pain worsened with walking.  It improved with rest.  He reports that he went to lunch in [place] and on the drive back developed recurrent chest pain, felt nauseous and vomited.  His wife had to drive the rest way back and they went to an urgent care.  The urgent care was concerned about his symptoms and advised that he should be seen in the emergency department.  He denies any shortness of breath, lightheadedness or dizziness, syncope, diaphoresis.  Denies similar episode in the past.  Does report family history of coronary artery disease.  Past Medical History Past Medical History:  Diagnosis Date   Arthritis    Hyperlipidemia    Leukemia, chronic lymphocytic (HCC)    SCCA (squamous cell carcinoma) of skin 08/08/2020   Mid Frontal Scalp (in situ) (curet and 5FU)   SCCA (squamous cell carcinoma) of skin 08/08/2020   Mid Parietal Scalp (in situ) (curet and 5FU)   SCCA (squamous cell carcinoma) of skin 08/08/2020   Mid Supratip of Nose (in situ)   Shingles    Squamous cell carcinoma of skin 12/08/2018   right jaw line cis   Vitamin D deficiency    Patient Active Problem List   Diagnosis Date Noted   Chest pain 06/26/2022   Prostatitis, acute 04/09/2022   Pyuria 04/01/2021   Persistent atrial fibrillation (HCC)    Atrial flutter (HCC) 11/29/2019   Stage 3a chronic kidney disease (HCC) 03/28/2019   Onychomycosis of great toe 03/18/2017   Prediabetes 03/19/2016   Routine general medical examination at a health care facility 03/19/2016   TSH elevation 03/17/2016   Benign  prostatic hyperplasia without lower urinary tract symptoms 03/17/2016   Vitamin D deficiency 09/07/2014   Hyperlipidemia LDL goal <130 01/27/2012   CLL (chronic lymphocytic leukemia) (HCC) 11/27/2011   Home Medication(s) Prior to Admission medications   Medication Sig Start Date End Date Taking? Authorizing Provider  atorvastatin (LIPITOR) 20 MG tablet TAKE 1 TABLET(20 MG) BY MOUTH DAILY Patient taking differently: Take 20 mg by mouth at bedtime. 04/17/22  Yes Wendall Stade, MD  ibuprofen (ADVIL) 200 MG tablet Take 400-600 mg by mouth every 6 (six) hours as needed for mild pain or headache.   Yes [provider]  Multiple Vitamins-Minerals (CENTRUM SILVER PO) Take 1 tablet by mouth daily with breakfast.   Yes [provider]  TYLENOL 325 MG tablet Take 325-650 mg by mouth every 6 (six) hours as needed for mild pain or headache.   Yes [provider]  Past Surgical History Past Surgical History:  Procedure Laterality Date   CARDIOVERSION N/A 12/16/2019   Procedure: CARDIOVERSION;  Surgeon: Thurmon Fair, MD;  Location: [facility] END[facility]COPY;  Service: Cardiovascular;  Laterality: N/A;   EYE SURGERY     LAPAROSCOPIC TRANS ANAL AND TRANSABDOMINAL RECTAL RESECTION WITH COLOANAL ANASTOMOSIS     SURGERY OF LIP     tumor   TONSILLECTOMY     VASECTOMY     Family History Family History  Problem Relation Age of Onset   Colon cancer Sister    Hyperlipidemia Sister    Stroke Sister    Heart disease Father    Emphysema Mother        never smoker, but exposed to 2nd hand from spouse   Cancer Mother    COPD Maternal Grandmother        never smoker   Stroke Maternal Grandfather    Diabetes Paternal Grandmother    Mental illness Paternal Grandmother    Asthma Sister     Social History Social History   Tobacco Use   Smoking status: Never    Smokeless tobacco: Never  Vaping Use   Vaping Use: Never used  Substance Use Topics   Alcohol use: No   Drug use: No   Allergies Azithromycin  Review of Systems Review of Systems  All other systems reviewed and are negative.   Physical Exam Vital Signs  I have reviewed the triage vital signs BP (!) 176/79   Pulse 69   Temp 98 F (36.7 C) (Oral)   Resp 16   Ht 5\' 11"  (1.803 m)   Wt 95.3 kg   SpO2 96%   BMI 29.30 kg/m  Physical Exam Vitals and nursing note reviewed.  Constitutional:      General: He is not in acute distress.    Appearance: Normal appearance.  HENT:     Mouth/Throat:     Mouth: Mucous membranes are moist.  Eyes:     Conjunctiva/sclera: Conjunctivae normal.  Cardiovascular:     Rate and Rhythm: Normal rate and regular rhythm.     Pulses:          Radial pulses are 2+ on the right side and 2+ on the left side.     Heart sounds: Murmur heard.  Pulmonary:     Effort: Pulmonary effort is normal. No respiratory distress.     Breath sounds: Normal breath sounds.  Abdominal:     General: Abdomen is flat.     Palpations: Abdomen is soft.     Tenderness: There is no abdominal tenderness.  Musculoskeletal:     Right lower leg: No edema.     Left lower leg: No edema.  Skin:    General: Skin is warm and dry.     Capillary Refill: Capillary refill takes less than 2 seconds.  Neurological:     Mental Status: He is alert and oriented to person, place, and time. Mental status is at baseline.  Psychiatric:        Mood and Affect: Mood normal.        Behavior: Behavior normal.     ED Results and Treatments Labs (all labs ordered are listed, but only abnormal results are displayed) Labs Reviewed  BASIC METABOLIC PANEL - Abnormal; Notable for the following components:      Result Value   Glucose, Bld 112 (*)    Creatinine, Ser 1.28 (*)    GFR, Estimated 55 (*)    All other components  within normal limits  CBC - Abnormal; Notable for the following  components:   WBC 32.4 (*)    All other components within normal limits  TROPONIN I (HIGH SENSITIVITY) - Abnormal; Notable for the following components:   Troponin I (High Sensitivity) 26 (*)    All other components within normal limits  TROPONIN I (HIGH SENSITIVITY) - Abnormal; Notable for the following components:   Troponin I (High Sensitivity) 58 (*)    All other components within normal limits  CBC WITH DIFFERENTIAL/PLATELET  COMPREHENSIVE METABOLIC PANEL  MAGNESIUM  MAGNESIUM                                                                                                                          Radiology DG Chest 2 View  Result Date: 06/26/2022 CLINICAL DATA:  Chest pain EXAM: CHEST - 2 VIEW COMPARISON:  CXR 08/04/12 FINDINGS: The heart size and mediastinal contours are within normal limits. No pleural effusion. No pneumothorax. Bibasilar atelectasis. The visualized skeletal structures are unremarkable. IMPRESSION: Bibasilar atelectasis. Electronically Signed   By: Lorenza Cambridge M.D.   On: 06/26/2022 19:47    Pertinent labs & imaging results that were available during my care of the patient were reviewed by me and considered in my medical decision making (see MDM for details).  Medications Ordered in ED Medications  acetaminophen (TYLENOL) tablet 650 mg (has no administration in time range)    Or  acetaminophen (TYLENOL) suppository 650 mg (has no administration in time range)  melatonin tablet 3 mg (has no administration in time range)  ondansetron (ZOFRAN) injection 4 mg (has no administration in time range)  nitroGLYCERIN (NITROSTAT) SL tablet 0.4 mg (has no administration in time range)  labetalol (NORMODYNE) injection 10 mg (10 mg Intravenous Given 06/26/22 2204)  aspirin chewable tablet 324 mg (324 mg Oral Given 06/26/22 2204)                                                                                                                                      Procedures Procedures  (including critical care time)  Medical Decision Making / ED Course   MDM:  87 year old male presenting to the emergency department chest pain.  Patient well-appearing, he denies any ongoing chest pain at this time.  Vitals notable for hypertension.  Exam overall unremarkable, does have a systolic murmur  Given history of chest pain  at exertion and then occurring at rest, with positive troponin, believe patient needs to be admitted for ACS rule out.  He is not having any chest pain currently.  His troponin is only mildly elevated but is uptrending.  Discussed with cardiology who will evaluate the patient and make further recommendations.  Since he is not having any active chest pain will defer heparin initiation.  He will need his troponin trended further.  He will need at least stress testing and echocardiogram.  He does have a murmur which could indicate aortic stenosis is a component of his symptoms and will need echocardiogram.  Doubt other causes of chest pain such as dissection, stable mediastinal silhouette, no radiation, no syncope and no ongoing pain.  Doubt pulmonary embolism, no pleuritic pain, tachycardia, hypoxia, SOB. CXR without signs of PNA or PTX. Discussed with cardiology who will evaluate patient. Discussed with hospitalist who will admit patient.   Clinical Course as of 06/26/22 2322  Thu Jun 26, 2022  2307 Discussed with hospitalist Dr. Arlean Hopping who will admit patient for further workup.  [WS]    Clinical Course User Index [WS] Lonell Grandchild, MD     Additional history obtained: -Additional history obtained from spouse -External records from outside source obtained and reviewed including: Chart review including previous notes, labs, imaging, consultation notes including prior PMD notes   Lab Tests: -I ordered, reviewed, and interpreted labs.   The pertinent results include:   Labs Reviewed  BASIC METABOLIC PANEL - Abnormal; Notable  for the following components:      Result Value   Glucose, Bld 112 (*)    Creatinine, Ser 1.28 (*)    GFR, Estimated 55 (*)    All other components within normal limits  CBC - Abnormal; Notable for the following components:   WBC 32.4 (*)    All other components within normal limits  TROPONIN I (HIGH SENSITIVITY) - Abnormal; Notable for the following components:   Troponin I (High Sensitivity) 26 (*)    All other components within normal limits  TROPONIN I (HIGH SENSITIVITY) - Abnormal; Notable for the following components:   Troponin I (High Sensitivity) 58 (*)    All other components within normal limits  CBC WITH DIFFERENTIAL/PLATELET  COMPREHENSIVE METABOLIC PANEL  MAGNESIUM  MAGNESIUM    Notable for elevated troponin  EKG   EKG Interpretation  Date/Time:  Thursday June 26 2022 18:36:52 EDT Ventricular Rate:  78 PR Interval:  212 QRS Duration: 84 QT Interval:  372 QTC Calculation: 424 R Axis:   56 Text Interpretation: Sinus rhythm with 1st degree A-V block Otherwise normal ECG When compared with ECG of 26-Jun-2022 18:00, No significant change since last tracing Confirmed by Alvino Blood ([id number]) on 06/26/2022 8:22:48 PM         Imaging Studies ordered: I ordered imaging studies including CXR On my interpretation imaging demonstrates no mediastinal widening  I independently visualized and interpreted imaging. I agree with the radiologist interpretation   Medicines ordered and prescription drug management: Meds ordered this encounter  Medications   labetalol (NORMODYNE) injection 10 mg   aspirin chewable tablet 324 mg   OR Linked Order Group    acetaminophen (TYLENOL) tablet 650 mg    acetaminophen (TYLENOL) suppository 650 mg   melatonin tablet 3 mg   ondansetron (ZOFRAN) injection 4 mg   nitroGLYCERIN (NITROSTAT) SL tablet 0.4 mg    -I have reviewed the patients home medicines and have made adjustments as needed   Consultations  Obtained: I  requested consultation with the cardiology team,  and discussed lab and imaging findings as well as pertinent plan - they recommend: admit to medicine, they will consult   Cardiac Monitoring: The patient was maintained on a cardiac monitor.  I personally viewed and interpreted the cardiac monitored which showed an underlying rhythm of: NSR  Reevaluation: After the interventions noted above, I reevaluated the patient and found that their symptoms have resolved  Co morbidities that complicate the patient evaluation  Past Medical History:  Diagnosis Date   Arthritis    Hyperlipidemia    Leukemia, chronic lymphocytic (HCC)    SCCA (squamous cell carcinoma) of skin 08/08/2020   Mid Frontal Scalp (in situ) (curet and 5FU)   SCCA (squamous cell carcinoma) of skin 08/08/2020   Mid Parietal Scalp (in situ) (curet and 5FU)   SCCA (squamous cell carcinoma) of skin 08/08/2020   Mid Supratip of Nose (in situ)   Shingles    Squamous cell carcinoma of skin 12/08/2018   right jaw line cis   Vitamin D deficiency       Dispostion: Disposition decision including need for hospitalization was considered, and patient admitted to the hospital.    Final Clinical Impression(s) / ED Diagnoses Final diagnoses:  Chest pain of uncertain etiology     This chart was dictated using voice recognition software.  Despite best efforts to proofread,  errors can occur which can change the documentation meaning.    Lonell Grandchild, MD 06/26/22 2322    Lonell Grandchild, MD 06/27/22 [phone]

## 2022-06-26 NOTE — ED Triage Notes (Signed)
Onset earlier today with chest pressure, SOB, and nausea after using the stationary bike. Went home showered and went out, started to feel better.   When driving home Patient started to have chest pressure again and begin to vomit.

## 2022-06-26 NOTE — ED Notes (Signed)
ED TO INPATIENT HANDOFF REPORT  ED Nurse Name and Phone #: [phone]  S Name/Age/Gender Francisco Larson 86 y.o. male Room/Bed: 003C/003C  Code Status   Code Status: Full Code  Home/SNF/Other Home Patient oriented to: self, place, time, and situation Is this baseline? Yes   Triage Complete: Triage complete  Chief Complaint Chest pain [R07.9]  Triage Note Pt sent by UC for chest, nausea and SOB at end of stationary bike workout today. Pt c/o epigastric pain   Allergies Allergies  Allergen Reactions   Azithromycin Itching and Other (See Comments)    Pt stated he thinks this made him dizzy, also    Level of Care/Admitting Diagnosis ED Disposition     ED Disposition  Admit   Condition  --   Comment  Hospital Area: [facility] [[id number]]  Level of Care: Telemetry Cardiac [103]  May place patient in observation at [facility] or [facility] if equivalent level of care is available:: No  Covid Evaluation: Asymptomatic - no recent exposure (last 10 days) testing not required  Diagnosis: Chest pain [454098]  Admitting Physician: Angie Fava [[id number]]  Attending Physician: Angie Fava [[id number]]          B Medical/Surgery History Past Medical History:  Diagnosis Date   Arthritis    Hyperlipidemia    Leukemia, chronic lymphocytic (HCC)    SCCA (squamous cell carcinoma) of skin 08/08/2020   Mid Frontal Scalp (in situ) (curet and 5FU)   SCCA (squamous cell carcinoma) of skin 08/08/2020   Mid Parietal Scalp (in situ) (curet and 5FU)   SCCA (squamous cell carcinoma) of skin 08/08/2020   Mid Supratip of Nose (in situ)   Shingles    Squamous cell carcinoma of skin 12/08/2018   right jaw line cis   Vitamin D deficiency    Past Surgical History:  Procedure Laterality Date   CARDIOVERSION N/A 12/16/2019   Procedure: CARDIOVERSION;  Surgeon: Thurmon Fair, MD;  Location: [facility] ENDOSCOPY;  Service: Cardiovascular;  Laterality: N/A;   EYE  SURGERY     LAPAROSCOPIC TRANS ANAL AND TRANSABDOMINAL RECTAL RESECTION WITH COLOANAL ANASTOMOSIS     SURGERY OF LIP     tumor   TONSILLECTOMY     VASECTOMY       A IV Location/Drains/Wounds Patient Lines/Drains/Airways Status     Active Line/Drains/Airways     Name Placement date Placement time Site Days   Peripheral IV 06/26/22 20 G Right Antecubital 06/26/22  2201  Antecubital  less than 1            Intake/Output Last 24 hours No intake or output data in the 24 hours ending 06/26/22 2322  Labs/Imaging Results for orders placed or performed during the hospital encounter of 06/26/22 (from the past 48 hour(s))  Basic metabolic panel     Status: Abnormal   Collection Time: 06/26/22  6:45 PM  Result Value Ref Range   Sodium 138 135 - 145 mmol/L   Potassium 4.5 3.5 - 5.1 mmol/L   Chloride 104 98 - 111 mmol/L   CO2 24 22 - 32 mmol/L   Glucose, Bld 112 (H) 70 - 99 mg/dL    Comment: Glucose reference range applies only to samples taken after fasting for at least 8 hours.   BUN 20 8 - 23 mg/dL   Creatinine, Ser 0.86 (H) 0.61 - 1.24 mg/dL   Calcium 9.2 8.9 - 57.8 mg/dL   GFR, Estimated 55 (L) >60 mL/min  Comment: (NOTE) Calculated using the CKD-EPI Creatinine Equation (2021)    Anion gap 10 5 - 15    Comment: Performed at [facility], [address] [place], [place] [address]  CBC     Status: Abnormal   Collection Time: 06/26/22  6:45 PM  Result Value Ref Range   WBC 32.4 (H) 4.0 - 10.5 K/uL   RBC 5.44 4.22 - 5.81 MIL/uL   Hemoglobin 16.8 13.0 - 17.0 g/dL   HCT 86.5 78.4 - 69.6 %   MCV 91.9 80.0 - 100.0 fL   MCH 30.9 26.0 - 34.0 pg   MCHC 33.6 30.0 - 36.0 g/dL   RDW 29.5 28.4 - 13.2 %   Platelets 208 150 - 400 K/uL   nRBC 0.2 0.0 - 0.2 %    Comment: Performed at [facility], [address] [place], [place] [address]  Troponin I (High Sensitivity)     Status: Abnormal   Collection Time: 06/26/22  6:45 PM  Result Value Ref Range   Troponin I  (High Sensitivity) 26 (H) <18 ng/L    Comment: (NOTE) Elevated high sensitivity troponin I (hsTnI) values and significant  changes across serial measurements may suggest ACS but many other  chronic and acute conditions are known to elevate hsTnI results.  Refer to the "Links" section for chest pain algorithms and additional  guidance. Performed at [facility], [address], [place], [place] [address]   Troponin I (High Sensitivity)     Status: Abnormal   Collection Time: 06/26/22 10:00 PM  Result Value Ref Range   Troponin I (High Sensitivity) 58 (H) <18 ng/L    Comment: RESULT CALLED TO, READ BACK BY AND VERIFIED WITH Josephine Igo, RN. [phone] 06/26/22. LPAIT (NOTE) Elevated high sensitivity troponin I (hsTnI) values and significant  changes across serial measurements may suggest ACS but many other  chronic and acute conditions are known to elevate hsTnI results.  Refer to the "Links" section for chest pain algorithms and additional  guidance. Performed at [facility], [address] [place], [place] [address]    DG Chest 2 View  Result Date: 06/26/2022 CLINICAL DATA:  Chest pain EXAM: CHEST - 2 VIEW COMPARISON:  CXR 08/04/12 FINDINGS: The heart size and mediastinal contours are within normal limits. No pleural effusion. No pneumothorax. Bibasilar atelectasis. The visualized skeletal structures are unremarkable. IMPRESSION: Bibasilar atelectasis. Electronically Signed   By: Lorenza Cambridge M.D.   On: 06/26/2022 19:47    Pending Labs Unresulted Labs (From admission, onward)     Start     Ordered   06/27/22 0500  CBC with Differential/Platelet  Tomorrow morning,   R        06/26/22 2313   06/27/22 0500  Comprehensive metabolic panel  Tomorrow morning,   R        06/26/22 2313   06/27/22 0500  Magnesium  Tomorrow morning,   R        06/26/22 2313   06/26/22 2313  Magnesium  Add-on,   AD        06/26/22 2313            Vitals/Pain Today's Vitals   06/26/22 2230  06/26/22 2245 06/26/22 2300 06/26/22 2310  BP: (!) 195/82 (!) 185/78 (!) 179/77 (!) 176/79  Pulse: 71 70 70 69  Resp: 11 10 16 16   Temp:      TempSrc:      SpO2: 96% 96%  94% 96%  Weight:      Height:      PainSc:        Isolation Precautions No active isolations  Medications Medications  acetaminophen (TYLENOL) tablet 650 mg (has no administration in time range)    Or  acetaminophen (TYLENOL) suppository 650 mg (has no administration in time range)  melatonin tablet 3 mg (has no administration in time range)  ondansetron (ZOFRAN) injection 4 mg (has no administration in time range)  nitroGLYCERIN (NITROSTAT) SL tablet 0.4 mg (has no administration in time range)  labetalol (NORMODYNE) injection 10 mg (10 mg Intravenous Given 06/26/22 2204)  aspirin chewable tablet 324 mg (324 mg Oral Given 06/26/22 2204)    Mobility walks     Focused Assessments    R Recommendations: See Admitting Provider Note  Report given to:   Additional Notes: pt a/ox4, continent x2

## 2022-06-26 NOTE — H&P (Signed)
History and Physical      Francisco Larson:[id number] DOB: 11-24-1935 DOA: 06/26/2022; DOS: 06/26/2022  PCP: Etta Grandchild, MD  Patient coming from: home   I have personally briefly reviewed patient's old medical records in [facility]  Chief Complaint: Chest pain  HPI: Francisco Larson is a 87 y.o. male with medical history significant for paroxysmal atrial fibrillation complicated by sick sinus syndrome status post pacemaker placement, CLL, hyperlipidemia, who is admitted to [facility] on 06/26/2022 with chest pain after presenting from home to [facility] ED complaining of chest pain.   The patient reports that he developed an episode of substernal chest pressure, nonradiating, while working out on an elliptical earlier on 06/26/2022.  The chest pain improved when discontinuing his workout on the elliptical, before subsequently intensifying while walking to his car.  Chest pain resolved spontaneously while sitting in his car.  This initial episode was associated with nausea in the absence of any vomiting.  While driving home, he experienced a second episode of substernal nonradiating chest pressure, of similar intensity relative to the above.  This episode was also associate with nausea however it also resulted in 1 episode of nonbloody, nonbilious emesis.  The second episode of chest pain resolved spontaneously while in the car, in the absence of any interval sublingual nitroglycerin.  Following resolution of the second episode of chest pain, the patient reports that he is remained chest pain-free.   He reports that these episodes of chest pain were nonpositional, nonpleuritic, not reproducible with direct palpation over the anterior chest wall.  Aside from the aforementioned nausea/vomiting, he denies any associated shortness of breath, diaphoresis, dizziness, palpitations, or syncope.  No recent cough or hemoptysis.  Denies any recent subjective fever, chills, rigors, or  generalized myalgias.  No recent trauma.  Denies any known history of underlying coronary artery disease.  He conveys a history of hyperlipidemia.  Additionally, chart review reveals a history of paroxysmal atrial fibrillation although the patient does not appear to be chronically anticoagulated.  His atrial fibrillation was complicated by sick sinus syndrome status post pacemaker placement.  Most recent echocardiogram occurred in December 2021 and was notable for LVEF 60 to 65%, no focal wall motion normalities, normal right ventricular systolic function, mildly dilated left atrium, trivial mitral regurgitation.  He denies any known history of underlying hypertension, diabetes, and conveys that he is a lifelong non-smoker.  No recent peripheral edema, erythema, Tenderness, or hemoptysis.  No personal or family history of DVT/PE.      ED Course:  Vital signs in the ED were notable for the following: Afebrile; heart rates in the 70s; initial systolic blood pressures in the low 100s, socially decreasing to 160 following dose of IV labetalol, as further quantified below; respiratory rate 15-18, oxygen saturation 95 to 99% on room air.  Labs were notable for the following: BMP notable for the following: Sodium 138, potassium 4.5, bicarbonate 24, creatinine 1.28, glucose 112.  High-sensitivity troponin I initially 26, with repeat value trending up slightly to 58, in the absence of any previous high-sensitivity troponin I data points available for reported comparison.  CBC notable for white cell count 32,400 compared to most recent prior value of 23,500 on 04/09/2022.  Per my interpretation, EKG in ED demonstrated the following: Sinus rhythm first-degree AV block, heart rate 78, otherwise, normal intervals; no evidence of T wave or ST changes, including evidence of ST elevation.  Imaging and additional notable ED work-up: 2 view  chest x-ray, performed radiology read, showed evidence of bibasilar  atelectasis, but otherwise no evidence of acute cardiopulmonary process, Cleen evidence of ventricular edema, effusion, or pneumothorax.  EDP discussed patient's case with on-call cardiology fellow, who recommended [facility] admission for further evaluation management of presenting chest pain, including further trending of troponin, echocardiogram, but did not recommend initiation of heparin drip at this time.  Cardiology to formally consult and see the patient in the morning.  While in the ED, the following were administered: Full dose aspirin x 1, labetalol 10 mg IV x 1 dose.  Subsequently, the patient was admitted for further evaluation and management of his presenting chest discomfort.     Review of Systems: As per HPI otherwise 10 point review of systems negative.   Past Medical History:  Diagnosis Date   Arthritis    Hyperlipidemia    Leukemia, chronic lymphocytic (HCC)    SCCA (squamous cell carcinoma) of skin 08/08/2020   Mid Frontal Scalp (in situ) (curet and 5FU)   SCCA (squamous cell carcinoma) of skin 08/08/2020   Mid Parietal Scalp (in situ) (curet and 5FU)   SCCA (squamous cell carcinoma) of skin 08/08/2020   Mid Supratip of Nose (in situ)   Shingles    Squamous cell carcinoma of skin 12/08/2018   right jaw line cis   Vitamin D deficiency     Past Surgical History:  Procedure Laterality Date   CARDIOVERSION N/A 12/16/2019   Procedure: CARDIOVERSION;  Surgeon: Thurmon Fair, MD;  Location: [facility] ENDOSCOPY;  Service: Cardiovascular;  Laterality: N/A;   EYE SURGERY     LAPAROSCOPIC TRANS ANAL AND TRANSABDOMINAL RECTAL RESECTION WITH COLOANAL ANASTOMOSIS     SURGERY OF LIP     tumor   TONSILLECTOMY     VASECTOMY      Social History:  reports that he has never smoked. He has never used smokeless tobacco. He reports that he does not drink alcohol and does not use drugs.   Allergies  Allergen Reactions   Azithromycin Itching and Other (See Comments)    Pt stated he  thinks this made him dizzy, also    Family History  Problem Relation Age of Onset   Colon cancer Sister    Hyperlipidemia Sister    Stroke Sister    Heart disease Father    Emphysema Mother        never smoker, but exposed to 2nd hand from spouse   Cancer Mother    COPD Maternal Grandmother        never smoker   Stroke Maternal Grandfather    Diabetes Paternal Grandmother    Mental illness Paternal Grandmother    Asthma Sister     Family history reviewed and not pertinent    Prior to Admission medications   Medication Sig Start Date End Date Taking? Authorizing Provider  atorvastatin (LIPITOR) 20 MG tablet TAKE 1 TABLET(20 MG) BY MOUTH DAILY Patient taking differently: Take 20 mg by mouth at bedtime. 04/17/22  Yes Wendall Stade, MD  ibuprofen (ADVIL) 200 MG tablet Take 400-600 mg by mouth every 6 (six) hours as needed for mild pain or headache.   Yes [provider]  Multiple Vitamins-Minerals (CENTRUM SILVER PO) Take 1 tablet by mouth daily with breakfast.   Yes [provider]  TYLENOL 325 MG tablet Take 325-650 mg by mouth every 6 (six) hours as needed for mild pain or headache.   Yes [provider]     Objective  Physical Exam: Vitals:   06/26/22 2230 06/26/22 2245 06/26/22 2300 06/26/22 2310  BP: (!) 195/82 (!) 185/78 (!) 179/77 (!) 176/79  Pulse: 71 70 70 69  Resp: 11 10 16 16   Temp:      TempSrc:      SpO2: 96% 96% 94% 96%  Weight:      Height:        General: appears to be stated age; alert, oriented Skin: warm, dry, no rash Head:  AT/[place] Mouth:  Oral mucosa membranes appear moist, normal dentition Neck: supple; trachea midline Heart:  RRR; did not appreciate any M/R/G Lungs: CTAB, did not appreciate any wheezes, rales, or rhonchi Abdomen: + BS; soft, ND, NT Vascular: 2+ pedal pulses b/l; 2+ radial pulses b/l Extremities: no peripheral edema, no muscle wasting Neuro: strength and sensation intact in upper and lower  extremities b/l     Labs on Admission: I have personally reviewed following labs and imaging studies  CBC: Recent Labs  Lab 06/26/22 1845  WBC 32.4*  HGB 16.8  HCT 50.0  MCV 91.9  PLT 208   Basic Metabolic Panel: Recent Labs  Lab 06/26/22 1845  NA 138  K 4.5  CL 104  CO2 24  GLUCOSE 112*  BUN 20  CREATININE 1.28*  CALCIUM 9.2   GFR: Estimated Creatinine Clearance: 50.6 mL/min (A) (by C-G formula based on SCr of 1.28 mg/dL (H)). Liver Function Tests: No results for input(s): "AST", "ALT", "ALKPHOS", "BILITOT", "PROT", "ALBUMIN" in the last 168 hours. No results for input(s): "LIPASE", "AMYLASE" in the last 168 hours. No results for input(s): "AMMONIA" in the last 168 hours. Coagulation Profile: No results for input(s): "INR", "PROTIME" in the last 168 hours. Cardiac Enzymes: No results for input(s): "CKTOTAL", "CKMB", "CKMBINDEX", "TROPONINI" in the last 168 hours. BNP (last 3 results) No results for input(s): "PROBNP" in the last 8760 hours. HbA1C: No results for input(s): "HGBA1C" in the last 72 hours. CBG: No results for input(s): "GLUCAP" in the last 168 hours. Lipid Profile: No results for input(s): "CHOL", "HDL", "LDLCALC", "TRIG", "CHOLHDL", "LDLDIRECT" in the last 72 hours. Thyroid Function Tests: No results for input(s): "TSH", "T4TOTAL", "FREET4", "T3FREE", "THYROIDAB" in the last 72 hours. Anemia Panel: No results for input(s): "VITAMINB12", "FOLATE", "FERRITIN", "TIBC", "IRON", "RETICCTPCT" in the last 72 hours. Urine analysis:    Component Value Date/Time   COLORURINE YELLOW 04/09/2022 1030   APPEARANCEUR Cloudy (A) 04/09/2022 1030   LABSPEC 1.020 04/09/2022 1030   PHURINE 6.5 04/09/2022 1030   GLUCOSEU NEGATIVE 04/09/2022 1030   HGBUR NEGATIVE 04/09/2022 1030   BILIRUBINUR NEGATIVE 04/09/2022 1030   BILIRUBINUR negative 03/15/2015 0829   BILIRUBINUR neg 02/28/2014 1037   KETONESUR NEGATIVE 04/09/2022 1030   PROTEINUR negative 03/15/2015  0829   PROTEINUR neg 02/28/2014 1037   UROBILINOGEN 0.2 04/09/2022 1030   NITRITE POSITIVE (A) 04/09/2022 1030   LEUKOCYTESUR SMALL (A) 04/09/2022 1030    Radiological Exams on Admission: DG Chest 2 View  Result Date: 06/26/2022 CLINICAL DATA:  Chest pain EXAM: CHEST - 2 VIEW COMPARISON:  CXR 08/04/12 FINDINGS: The heart size and mediastinal contours are within normal limits. No pleural effusion. No pneumothorax. Bibasilar atelectasis. The visualized skeletal structures are unremarkable. IMPRESSION: Bibasilar atelectasis. Electronically Signed   By: Lorenza Cambridge M.D.   On: 06/26/2022 19:47      Assessment/Plan   Principal Problem:   Chest pain Active Problems:   CLL (chronic lymphocytic leukemia) (HCC)   HLD (hyperlipidemia)   Paroxysmal atrial fibrillation (  HCC)   Elevated troponin     #) Chest pain: 2 episodes of substernal nonradiating chest pressure earlier today, with the first episode exertional that improved with rest, and the second episode occurring at rest with spontaneous resolution, in the absence of any sublingual nitroglycerin.  Confirms that he has remained chest pain-free subsequent to resolution of the second episode.  given the presence of some typical characteristics in this patient with multiple CAD risk factors, no prior cardiac ischemic evaluation, and a presenting HEART score of 5 conveying a moderate risk of major acute cardiac event over the ensuing 6 weeks, the decision was made to admit patient for overnight observation in order to rule-out ACS. Of note, Troponin demonstrates mild elevation (26 --> 58), while EKG shows no evidence of acute ischemic changes, including no evidence of STEMI, and CXR showed no acute CP process, including no evidence of pneumothorax. Patient currently CP free. Full dose aspirin was administered in the ED this evening.   Of note, while patient's blood pressure was initially noted to be elevated, his history, nature of chest pain,  appears less suggestive of aortic dissection, including no radiation of chest pain into the back between the shoulder blades, and no tearing sensation.   EDP discussed patient's case with on-call cardiology fellow, who recommended [facility] admission for further evaluation management of presenting chest pain, including further trending of troponin, echocardiogram, but did not recommend initiation of heparin drip at this time.  Cardiology to formally consult and see the patient in the morning.  Aside from ACS, differential also includes non-cardiac etiologies including musculoskeletal possibilities such as costochondritis, GI sources including GERD vs esophagitis vs gastritis vs PUD vs esophageal spasm, as well as anxiety. Presentation is clinically less suggestive of acute PE. Will also add-on lipase level.    Plan: trend serial troponin. Monitor on telemetry. PRN sublingual nitroglycerin. PRN EKG for subsequent episodes of chest pain. Check serum Mg level and repeat BMP in the morning, with prn supplementation to maintain Mg and potassium levels greater than or equal to 2.0 and 4.0, respectively, to further reduce risk of ventricular arrhythmia. Repeat CBC in the AM. Resume home atorvastatin. Will consider BB.  Echocardiogram ordered for the morning.  Cardiology to formally consult, as above.  Add on lipase.           #) CLL: Documented history of such, presenting with blood cell count similar to baseline range.   Plan: Repeat CBC in the morning.                 #) Hyperlipidemia: documented h/o such. On atorvastatin as outpatient.   Plan: continue home statin.                   #) Paroxysmal atrial fibrillation: Documented history of such. In setting of CHA2DS2-VASc score of 2, there is an indication for chronic anticoagulation for thromboembolic prophylaxis.  However, it does not appear that the patient is chronically anticoagulated, there has to now for which is  not entirely clear to me at this time. home AV nodal blocking regimen: None.  Most recent echocardiogram occurred in December 2021, with results as conveyed above. Presenting EKG sinus rhythm with first-degree AV block, in the absence of overt evidence of acute ischemic changes.  Additionally, it appears that is atrial fibrillation is complicated by history of sick sinus syndrome for which she has undergone pacemaker placement.   Plan: monitor strict I's & O's and daily weights. CMP/CBC in  AM. Check serum mag level.  Will attempt additional chart review to ascertain rationale by the absence of chronic anticoagulation.  Monitor on telemetry.  Follow-up result echocardiogram ordered for the morning.        DVT prophylaxis: SCD's   Code Status: Full code Family Communication: none Disposition Plan: Per Rounding Team Consults called: EDP d/w on-call cardiology fellow, as further detailed above;  Admission status: obs     I SPENT GREATER THAN 75  MINUTES IN CLINICAL CARE TIME/MEDICAL DECISION-MAKING IN COMPLETING THIS ADMISSION.      Chaney Born Trayson Stitely DO [facility]  From 7PM - 7AM   06/26/2022, 11:18 PM

## 2022-06-26 NOTE — ED Notes (Signed)
Patient placed on ER room 3 & in hospital gown, cardiac monitoring applied & VS cycled.

## 2022-06-27 ENCOUNTER — Observation Stay (HOSPITAL_BASED_OUTPATIENT_CLINIC_OR_DEPARTMENT_OTHER): Payer: Medicare PPO

## 2022-06-27 ENCOUNTER — Telehealth: Payer: Self-pay | Admitting: Physician Assistant

## 2022-06-27 ENCOUNTER — Encounter (HOSPITAL_COMMUNITY)
Admission: EM | Disposition: A | Discharge status: Home / Self Care | Payer: Self-pay | Source: Home / Self Care | Attending: Internal Medicine

## 2022-06-27 ENCOUNTER — Encounter (HOSPITAL_COMMUNITY): Payer: Self-pay | Admitting: Internal Medicine

## 2022-06-27 DIAGNOSIS — Z881 Allergy status to other antibiotic agents status: Secondary | ICD-10-CM | POA: Diagnosis not present

## 2022-06-27 DIAGNOSIS — I2510 Atherosclerotic heart disease of native coronary artery without angina pectoris: Secondary | ICD-10-CM | POA: Diagnosis not present

## 2022-06-27 DIAGNOSIS — Z950 Presence of cardiac pacemaker: Secondary | ICD-10-CM | POA: Diagnosis not present

## 2022-06-27 DIAGNOSIS — I214 Non-ST elevation (NSTEMI) myocardial infarction: Secondary | ICD-10-CM | POA: Diagnosis present

## 2022-06-27 DIAGNOSIS — N400 Enlarged prostate without lower urinary tract symptoms: Secondary | ICD-10-CM | POA: Diagnosis present

## 2022-06-27 DIAGNOSIS — Z8249 Family history of ischemic heart disease and other diseases of the circulatory system: Secondary | ICD-10-CM | POA: Diagnosis not present

## 2022-06-27 DIAGNOSIS — I200 Unstable angina: Secondary | ICD-10-CM

## 2022-06-27 DIAGNOSIS — I249 Acute ischemic heart disease, unspecified: Secondary | ICD-10-CM | POA: Diagnosis not present

## 2022-06-27 DIAGNOSIS — R072 Precordial pain: Secondary | ICD-10-CM | POA: Diagnosis not present

## 2022-06-27 DIAGNOSIS — Z955 Presence of coronary angioplasty implant and graft: Secondary | ICD-10-CM | POA: Diagnosis not present

## 2022-06-27 DIAGNOSIS — Z800 Family history of malignant neoplasm of digestive organs: Secondary | ICD-10-CM | POA: Diagnosis not present

## 2022-06-27 DIAGNOSIS — R079 Chest pain, unspecified: Secondary | ICD-10-CM | POA: Diagnosis present

## 2022-06-27 DIAGNOSIS — R7303 Prediabetes: Secondary | ICD-10-CM | POA: Diagnosis present

## 2022-06-27 DIAGNOSIS — Z825 Family history of asthma and other chronic lower respiratory diseases: Secondary | ICD-10-CM | POA: Diagnosis not present

## 2022-06-27 DIAGNOSIS — Z83438 Family history of other disorder of lipoprotein metabolism and other lipidemia: Secondary | ICD-10-CM | POA: Diagnosis not present

## 2022-06-27 DIAGNOSIS — I480 Paroxysmal atrial fibrillation: Secondary | ICD-10-CM | POA: Diagnosis present

## 2022-06-27 DIAGNOSIS — I160 Hypertensive urgency: Secondary | ICD-10-CM | POA: Diagnosis present

## 2022-06-27 DIAGNOSIS — Z833 Family history of diabetes mellitus: Secondary | ICD-10-CM | POA: Diagnosis not present

## 2022-06-27 DIAGNOSIS — I25110 Atherosclerotic heart disease of native coronary artery with unstable angina pectoris: Secondary | ICD-10-CM | POA: Diagnosis present

## 2022-06-27 DIAGNOSIS — I495 Sick sinus syndrome: Secondary | ICD-10-CM | POA: Diagnosis present

## 2022-06-27 DIAGNOSIS — Z79899 Other long term (current) drug therapy: Secondary | ICD-10-CM | POA: Diagnosis not present

## 2022-06-27 DIAGNOSIS — I440 Atrioventricular block, first degree: Secondary | ICD-10-CM | POA: Diagnosis present

## 2022-06-27 DIAGNOSIS — Z9889 Other specified postprocedural states: Secondary | ICD-10-CM | POA: Diagnosis not present

## 2022-06-27 DIAGNOSIS — R7989 Other specified abnormal findings of blood chemistry: Secondary | ICD-10-CM | POA: Diagnosis present

## 2022-06-27 DIAGNOSIS — I10 Essential (primary) hypertension: Secondary | ICD-10-CM | POA: Diagnosis present

## 2022-06-27 DIAGNOSIS — E785 Hyperlipidemia, unspecified: Secondary | ICD-10-CM | POA: Diagnosis present

## 2022-06-27 DIAGNOSIS — I4892 Unspecified atrial flutter: Secondary | ICD-10-CM | POA: Diagnosis present

## 2022-06-27 DIAGNOSIS — C9110 Chronic lymphocytic leukemia of B-cell type not having achieved remission: Secondary | ICD-10-CM | POA: Diagnosis present

## 2022-06-27 DIAGNOSIS — Z85828 Personal history of other malignant neoplasm of skin: Secondary | ICD-10-CM | POA: Diagnosis not present

## 2022-06-27 DIAGNOSIS — Z823 Family history of stroke: Secondary | ICD-10-CM | POA: Diagnosis not present

## 2022-06-27 DIAGNOSIS — Z884 Allergy status to anesthetic agent status: Secondary | ICD-10-CM | POA: Diagnosis not present

## 2022-06-27 HISTORY — PX: LEFT HEART CATH AND CORONARY ANGIOGRAPHY: CATH118249

## 2022-06-27 HISTORY — PX: CORONARY STENT INTERVENTION: CATH118234

## 2022-06-27 LAB — COMPREHENSIVE METABOLIC PANEL
ALT: 19 U/L (ref 0–44)
AST: 22 U/L (ref 15–41)
Albumin: 2.8 g/dL — ABNORMAL LOW (ref 3.5–5.0)
Alkaline Phosphatase: 79 U/L (ref 38–126)
Anion gap: 8 (ref 5–15)
BUN: 17 mg/dL (ref 8–23)
CO2: 24 mmol/L (ref 22–32)
Calcium: 8.3 mg/dL — ABNORMAL LOW (ref 8.9–10.3)
Chloride: 104 mmol/L (ref 98–111)
Creatinine, Ser: 1.08 mg/dL (ref 0.61–1.24)
GFR, Estimated: >60 mL/min (ref >60)
Glucose, Bld: 130 mg/dL — ABNORMAL HIGH (ref 70–99)
Potassium: 3.9 mmol/L (ref 3.5–5.1)
Sodium: 136 mmol/L (ref 135–145)
Total Bilirubin: 0.5 mg/dL (ref 0.3–1.2)
Total Protein: 5.3 g/dL — ABNORMAL LOW (ref 6.5–8.1)

## 2022-06-27 LAB — CBC WITH DIFFERENTIAL/PLATELET
Abs Immature Granulocytes: 0 10*3/uL (ref 0.00–0.07)
Basophils Absolute: 0 10*3/uL (ref 0.0–0.1)
Basophils Relative: 0 %
Eosinophils Absolute: 0.2 10*3/uL (ref 0.0–0.5)
Eosinophils Relative: 1 %
HCT: 43.3 % (ref 39.0–52.0)
Hemoglobin: 14.5 g/dL (ref 13.0–17.0)
Lymphocytes Relative: 64 %
Lymphs Abs: 15 10*3/uL — ABNORMAL HIGH (ref 0.7–4.0)
MCH: 29.7 pg (ref 26.0–34.0)
MCHC: 33.5 g/dL (ref 30.0–36.0)
MCV: 88.5 fL (ref 80.0–100.0)
Monocytes Absolute: 1.4 10*3/uL — ABNORMAL HIGH (ref 0.1–1.0)
Monocytes Relative: 6 %
Neutro Abs: 6.8 10*3/uL (ref 1.7–7.7)
Neutrophils Relative %: 29 %
Platelets: 157 10*3/uL (ref 150–400)
RBC: 4.89 MIL/uL (ref 4.22–5.81)
RDW: 13.1 % (ref 11.5–15.5)
Smear Review: NORMAL
WBC: 23.5 10*3/uL — ABNORMAL HIGH (ref 4.0–10.5)
nRBC: 0.1 % (ref 0.0–0.2)

## 2022-06-27 LAB — ECHOCARDIOGRAM COMPLETE
AR max vel: 1.04 cm2
AV Area VTI: 0.99 cm2
AV Area mean vel: 1.09 cm2
AV Mean grad: 13.7 mmHg
AV Peak grad: 25.3 mmHg
Ao pk vel: 2.51 m/s
Area-P 1/2: 2.73 cm2
Calc EF: 52.8 %
Height: 71 in
MV VTI: 1.76 cm2
S' Lateral: 2.9 cm
Single Plane A2C EF: 53.4 %
Single Plane A4C EF: 56 %
Weight: 3305.14 oz

## 2022-06-27 LAB — POCT ACTIVATED CLOTTING TIME
Activated Clotting Time: 305 seconds
Activated Clotting Time: 324 seconds

## 2022-06-27 LAB — MRSA NEXT GEN BY PCR, NASAL: MRSA by PCR Next Gen: NOT DETECTED

## 2022-06-27 LAB — LIPID PANEL
Cholesterol: 122 mg/dL (ref 0–200)
HDL: 32 mg/dL — ABNORMAL LOW (ref >40)
LDL Cholesterol: 76 mg/dL (ref 0–99)
Total CHOL/HDL Ratio: 3.8 RATIO
Triglycerides: 71 mg/dL (ref <150)
VLDL: 14 mg/dL (ref 0–40)

## 2022-06-27 LAB — MAGNESIUM
Magnesium: 2.3 mg/dL (ref 1.7–2.4)
Magnesium: 2.1 mg/dL (ref 1.7–2.4)

## 2022-06-27 LAB — HEMOGLOBIN A1C
Hgb A1c MFr Bld: 5.7 % — ABNORMAL HIGH (ref 4.8–5.6)
Mean Plasma Glucose: 116.89 mg/dL

## 2022-06-27 LAB — LIPASE, BLOOD: Lipase: 107 U/L — ABNORMAL HIGH (ref 11–51)

## 2022-06-27 LAB — TROPONIN I (HIGH SENSITIVITY): Troponin I (High Sensitivity): 54 ng/L — ABNORMAL HIGH (ref <18)

## 2022-06-27 LAB — PATHOLOGIST SMEAR REVIEW

## 2022-06-27 SURGERY — LEFT HEART CATH AND CORONARY ANGIOGRAPHY
Anesthesia: LOCAL

## 2022-06-27 MED ORDER — ATORVASTATIN CALCIUM 80 MG PO TABS
80.0000 mg | ORAL_TABLET | Freq: Every day | ORAL | Status: DC
  Administered 2022-06-27 – 2022-06-28 (×2): 80 mg via ORAL
  Filled 2022-06-27 (×4): qty 1

## 2022-06-27 MED ORDER — VERAPAMIL HCL 2.5 MG/ML IV SOLN
INTRAVENOUS | Status: AC
  Filled 2022-06-27: qty 2

## 2022-06-27 MED ORDER — LABETALOL HCL 5 MG/ML IV SOLN: 10.0000 mg | INTRAVENOUS | Status: AC | PRN

## 2022-06-27 MED ORDER — IPRATROPIUM-ALBUTEROL 0.5-2.5 (3) MG/3ML IN SOLN: 3.0000 mL | RESPIRATORY_TRACT | Status: DC | PRN

## 2022-06-27 MED ORDER — ASPIRIN 81 MG PO TBEC
81.0000 mg | DELAYED_RELEASE_TABLET | Freq: Every day | ORAL | 0 refills | Status: DC
Start: 2022-06-27 — End: 2022-07-28

## 2022-06-27 MED ORDER — ASPIRIN 81 MG PO CHEW
81.0000 mg | CHEWABLE_TABLET | ORAL | Status: AC
  Administered 2022-06-27: 81 mg via ORAL

## 2022-06-27 MED ORDER — CLOPIDOGREL BISULFATE 300 MG PO TABS
ORAL_TABLET | ORAL | Status: AC
  Filled 2022-06-27: qty 1

## 2022-06-27 MED ORDER — HEPARIN BOLUS VIA INFUSION
4000.0000 [IU] | Freq: Once | INTRAVENOUS | Status: AC
  Administered 2022-06-27: 4000 [IU] via INTRAVENOUS
  Filled 2022-06-27: qty 4000

## 2022-06-27 MED ORDER — CARVEDILOL 3.125 MG PO TABS
3.1250 mg | ORAL_TABLET | Freq: Two times a day (BID) | ORAL | Status: DC
  Administered 2022-06-27 – 2022-06-28 (×3): 3.125 mg via ORAL
  Filled 2022-06-27 (×5): qty 1

## 2022-06-27 MED ORDER — HEPARIN SODIUM (PORCINE) 1000 UNIT/ML IJ SOLN
INTRAMUSCULAR | Status: DC | PRN
  Administered 2022-06-27 (×2): 5000 [IU] via INTRAVENOUS

## 2022-06-27 MED ORDER — HEPARIN (PORCINE) 25000 UT/250ML-% IV SOLN
1300.0000 [IU]/h | INTRAVENOUS | Status: DC
  Administered 2022-06-27: 1300 [IU]/h via INTRAVENOUS
  Filled 2022-06-27: qty 250

## 2022-06-27 MED ORDER — CLOPIDOGREL BISULFATE 75 MG PO TABS
75.0000 mg | ORAL_TABLET | Freq: Every day | ORAL | 0 refills | Status: DC
Start: 2022-06-27 — End: 2022-07-15

## 2022-06-27 MED ORDER — FAMOTIDINE IN NACL 20-0.9 MG/50ML-% IV SOLN
INTRAVENOUS | Status: AC | PRN
  Administered 2022-06-27: 20 mg via INTRAVENOUS

## 2022-06-27 MED ORDER — MIDAZOLAM HCL 2 MG/2ML IJ SOLN
INTRAMUSCULAR | Status: AC
  Filled 2022-06-27: qty 2

## 2022-06-27 MED ORDER — NITROGLYCERIN 1 MG/10 ML FOR IR/CATH LAB
INTRA_ARTERIAL | Status: DC | PRN
  Administered 2022-06-27 (×2): 200 ug via INTRACORONARY

## 2022-06-27 MED ORDER — MIDAZOLAM HCL 2 MG/2ML IJ SOLN
INTRAMUSCULAR | Status: DC | PRN
  Administered 2022-06-27: 1 mg via INTRAVENOUS

## 2022-06-27 MED ORDER — HYDRALAZINE HCL 20 MG/ML IJ SOLN: 10.0000 mg | INTRAMUSCULAR | Status: DC | PRN

## 2022-06-27 MED ORDER — SODIUM CHLORIDE 0.9 % WEIGHT BASED INFUSION
3.0000 mL/kg/h | INTRAVENOUS | Status: DC
  Administered 2022-06-27: 3 mL/kg/h via INTRAVENOUS

## 2022-06-27 MED ORDER — GUAIFENESIN 100 MG/5ML PO LIQD: 5.0000 mL | ORAL | Status: DC | PRN

## 2022-06-27 MED ORDER — METOPROLOL TARTRATE 5 MG/5ML IV SOLN: 5.0000 mg | INTRAVENOUS | Status: DC | PRN

## 2022-06-27 MED ORDER — FENTANYL CITRATE (PF) 100 MCG/2ML IJ SOLN
INTRAMUSCULAR | Status: DC | PRN
  Administered 2022-06-27: 25 ug via INTRAVENOUS

## 2022-06-27 MED ORDER — NITROGLYCERIN 1 MG/10 ML FOR IR/CATH LAB
INTRA_ARTERIAL | Status: AC
  Filled 2022-06-27: qty 10

## 2022-06-27 MED ORDER — SODIUM CHLORIDE 0.9% FLUSH: 3.0000 mL | INTRAVENOUS | Status: DC | PRN

## 2022-06-27 MED ORDER — CLOPIDOGREL BISULFATE 300 MG PO TABS
ORAL_TABLET | ORAL | Status: DC | PRN
  Administered 2022-06-27: 600 mg via ORAL

## 2022-06-27 MED ORDER — ASPIRIN 81 MG PO CHEW
81.0000 mg | CHEWABLE_TABLET | Freq: Every day | ORAL | Status: DC
  Administered 2022-06-28: 81 mg via ORAL
  Filled 2022-06-27 (×3): qty 1

## 2022-06-27 MED ORDER — IOHEXOL 350 MG/ML SOLN
INTRAVENOUS | Status: DC | PRN
  Administered 2022-06-27: 160 mL

## 2022-06-27 MED ORDER — FENTANYL CITRATE (PF) 100 MCG/2ML IJ SOLN
INTRAMUSCULAR | Status: AC
  Filled 2022-06-27: qty 2

## 2022-06-27 MED ORDER — AMLODIPINE BESYLATE 5 MG PO TABS
5.0000 mg | ORAL_TABLET | Freq: Every day | ORAL | Status: DC
  Administered 2022-06-27 – 2022-06-28 (×2): 5 mg via ORAL
  Filled 2022-06-27 (×3): qty 1

## 2022-06-27 MED ORDER — TRAZODONE HCL 50 MG PO TABS: 50.0000 mg | ORAL_TABLET | Freq: Every evening | ORAL | Status: DC | PRN

## 2022-06-27 MED ORDER — SODIUM CHLORIDE 0.9% FLUSH
3.0000 mL | Freq: Two times a day (BID) | INTRAVENOUS | Status: DC
  Administered 2022-06-27 – 2022-06-28 (×3): 3 mL via INTRAVENOUS

## 2022-06-27 MED ORDER — LIDOCAINE HCL (PF) 1 % IJ SOLN
INTRAMUSCULAR | Status: AC
  Filled 2022-06-27: qty 30

## 2022-06-27 MED ORDER — SODIUM CHLORIDE 0.9% FLUSH
3.0000 mL | Freq: Two times a day (BID) | INTRAVENOUS | Status: DC
  Administered 2022-06-27 – 2022-06-28 (×2): 3 mL via INTRAVENOUS

## 2022-06-27 MED ORDER — SODIUM CHLORIDE 0.9 % IV SOLN: INTRAVENOUS | Status: AC

## 2022-06-27 MED ORDER — VERAPAMIL HCL 2.5 MG/ML IV SOLN
INTRAVENOUS | Status: DC | PRN
  Administered 2022-06-27: 10 mL via INTRA_ARTERIAL

## 2022-06-27 MED ORDER — HEPARIN SODIUM (PORCINE) 1000 UNIT/ML IJ SOLN
INTRAMUSCULAR | Status: AC
  Filled 2022-06-27: qty 10

## 2022-06-27 MED ORDER — HEPARIN (PORCINE) IN NACL 1000-0.9 UT/500ML-% IV SOLN
INTRAVENOUS | Status: DC | PRN
  Administered 2022-06-27 (×2): 500 mL

## 2022-06-27 MED ORDER — SENNOSIDES-DOCUSATE SODIUM 8.6-50 MG PO TABS: 1.0000 | ORAL_TABLET | Freq: Every evening | ORAL | Status: DC | PRN

## 2022-06-27 MED ORDER — SODIUM CHLORIDE 0.9 % WEIGHT BASED INFUSION: 1.0000 mL/kg/h | INTRAVENOUS | Status: DC

## 2022-06-27 MED ORDER — SODIUM CHLORIDE 0.9 % IV SOLN: 250.0000 mL | INTRAVENOUS | Status: DC | PRN

## 2022-06-27 MED ORDER — LIDOCAINE HCL (PF) 1 % IJ SOLN
INTRAMUSCULAR | Status: DC | PRN
  Administered 2022-06-27: 2 mL

## 2022-06-27 MED ORDER — CLOPIDOGREL BISULFATE 75 MG PO TABS
75.0000 mg | ORAL_TABLET | Freq: Every day | ORAL | Status: DC
  Administered 2022-06-28: 75 mg via ORAL
  Filled 2022-06-27 (×2): qty 1

## 2022-06-27 MED ORDER — FAMOTIDINE IN NACL 20-0.9 MG/50ML-% IV SOLN
INTRAVENOUS | Status: AC
  Filled 2022-06-27: qty 50

## 2022-06-27 SURGICAL SUPPLY — 24 items
BALLN EMERGE MR 2.25X12 (BALLOONS) ×1
BALLN EMERGE MR 2.5X12 (BALLOONS) ×1
BALLN ~~LOC~~ EMERGE MR 3.0X8 (BALLOONS) ×1
BALLOON EMERGE MR 2.25X12 (BALLOONS) IMPLANT
BALLOON EMERGE MR 2.5X12 (BALLOONS) IMPLANT
BALLOON ~~LOC~~ EMERGE MR 3.0X8 (BALLOONS) IMPLANT
CATH INFINITI JR4 5F (CATHETERS) IMPLANT
CATH OPTITORQUE TIG 4.0 5F (CATHETERS) IMPLANT
CATH VISTA GUIDE 6FR XBLAD3.5 (CATHETERS) IMPLANT
DEVICE RAD COMP TR BAND LRG (VASCULAR PRODUCTS) IMPLANT
GLIDESHEATH SLEND SS 6F .021 (SHEATH) IMPLANT
GUIDEWIRE INQWIRE 1.5J.035X260 (WIRE) IMPLANT
INQWIRE 1.5J .035X260CM (WIRE) ×1
KIT ENCORE 26 ADVANTAGE (KITS) IMPLANT
KIT HEART LEFT (KITS) ×1 IMPLANT
PACK CARDIAC CATHETERIZATION (CUSTOM PROCEDURE TRAY) ×1 IMPLANT
SHEATH PROBE COVER 6X72 (BAG) IMPLANT
STENT SYNERGY XD 2.50X16 (Permanent Stent) IMPLANT
STENT SYNERGY XD 2.75X12 (Permanent Stent) IMPLANT
SYNERGY XD 2.50X16 (Permanent Stent) ×1 IMPLANT
SYNERGY XD 2.75X12 (Permanent Stent) ×1 IMPLANT
TRANSDUCER W/STOPCOCK (MISCELLANEOUS) ×1 IMPLANT
TUBING CIL FLEX 10 FLL-RA (TUBING) ×1 IMPLANT
WIRE ASAHI PROWATER 180CM (WIRE) IMPLANT

## 2022-06-27 NOTE — Plan of Care (Signed)
The patient is admitted from ED to 2 C 10. A & O x 4. Denied any acute pain right now. The patient is oriented to his room, ascom and staff. He voiced no concern at this time. Will continue to monitor.

## 2022-06-27 NOTE — Progress Notes (Addendum)
Patient seen and examined, agree with plan per Dr Welton Flakes.  Description of episode suggestive of angina, as describes exertional chest pressure while exercising yesterday.  Subsequently had episode of chest pressure at rest.  Currently chest pain-free.  EKG unremarkable.  Mild troponin elevation (26 > 58 > 54).  Has known CAD, calcium score 285 in 2016.  Start heparin gtt. Recommend LHC today.  Also has systolic murmur on exam, suspect aortic stenosis.  Will check echocardiogram  Risks and benefits of cardiac catheterization have been discussed with the patient.  These include bleeding, infection, kidney damage, stroke, heart attack, death.  The patient understands these risks and is willing to proceed.  Little Ishikawa, MD

## 2022-06-27 NOTE — Progress Notes (Signed)
ANTICOAGULATION CONSULT NOTE - Initial Consult  Pharmacy Consult for Heparin Indication: chest pain/ACS/STEMI  Allergies  Allergen Reactions   Azithromycin Itching and Other (See Comments)    Pt stated he thinks this made him dizzy, also    Patient Measurements: Height: 5\' 11"  (180.3 cm) Weight: 93.7 kg (206 lb 9.1 oz) IBW/kg (Calculated) : 75.3 Heparin Dosing Weight: 93.7 kg  Vital Signs: Temp: 98.1 F (36.7 C) (06/14 0700) Temp Source: Oral (06/14 0700) BP: 164/67 (06/14 0700) Pulse Rate: 64 (06/14 0700)  Labs: Recent Labs    06/26/22 1845 06/26/22 2200 06/27/22 0545  HGB 16.8  --  14.5  HCT 50.0  --  43.3  PLT 208  --  157  CREATININE 1.28*  --  1.08  TROPONINIHS 26* 58* 54*    Estimated Creatinine Clearance: 59.6 mL/min (by C-G formula based on SCr of 1.08 mg/dL).   Medical History: Past Medical History:  Diagnosis Date   Arthritis    Hyperlipidemia    Leukemia, chronic lymphocytic (HCC)    SCCA (squamous cell carcinoma) of skin 08/08/2020   Mid Frontal Scalp (in situ) (curet and 5FU)   SCCA (squamous cell carcinoma) of skin 08/08/2020   Mid Parietal Scalp (in situ) (curet and 5FU)   SCCA (squamous cell carcinoma) of skin 08/08/2020   Mid Supratip of Nose (in situ)   Shingles    Squamous cell carcinoma of skin 12/08/2018   right jaw line cis   Vitamin D deficiency     Medications:  Medications Prior to Admission  Medication Sig Dispense Refill Last Dose   atorvastatin (LIPITOR) 20 MG tablet TAKE 1 TABLET(20 MG) BY MOUTH DAILY (Patient taking differently: Take 20 mg by mouth at bedtime.) 90 tablet 2 06/25/2022 at pm   ibuprofen (ADVIL) 200 MG tablet Take 400-600 mg by mouth every 6 (six) hours as needed for mild pain or headache.   Past Week   Multiple Vitamins-Minerals (CENTRUM SILVER PO) Take 1 tablet by mouth daily with breakfast.   06/26/2022 at am   TYLENOL 325 MG tablet Take 325-650 mg by mouth every 6 (six) hours as needed for mild pain or  headache.   unk    Assessment:  87 y.o. male with medical history significant for paroxysmal atrial fibrillation complicated by sick sinus syndrome status post pacemaker placement, CLL, hyperlipidemia, who is admitted to [facility] on 06/26/2022 . Pharmacy consulted 6/14 for IV heparin infusion for ACS/STEMI.  Patient was not taking anticoagulation PTA.  CBC is within normal.   SCr 1.08 No bleeding reported.   Goal of Therapy:  Heparin level 0.3-0.7 units/ml Monitor platelets by anticoagulation protocol: Yes   Plan:  Give Heparin bolus 4000 units IV x1 Start Heparin drip at 1300 units/hr Check 8 hr heparin level Daily HL, CBC while on IV heparin.    06/27/2022,10:03 AM

## 2022-06-27 NOTE — Consult Note (Signed)
 Cardiology Consultation   Patient ID: Francisco Larson MRN: [id number]; DOB: 12/19/1935  Admit date: 06/26/2022 Date of Consult: 06/27/2022  PCP:  Jones, Thomas L, MD   [facility] Providers Cardiologist:  Peter Nishan, MD        Patient Profile:   Francisco Larson is a 86 y.o. male with a hx of CLL and SCC  who is being seen 06/27/2022 for the evaluation of chest pain at the request of Dr. Howerter  History of Present Illness:   Francisco Larson is a 86 y.o. male with a hx of CLL and SCC  who is being seen 06/27/2022 for the evaluation of chest pain at the request of Dr. Howerter. He had been doing well till this afternoon when he started having chest pain/pressure after his bike exercise. The pain improved with rest. It was associated with nausea. However later in the afternoon, another episode happened. Again with nausea. Hence came to the ED. Currently he denies any CP. Denies any SOB. In the ED, troponin were 26 followed by 58. EKG showed no significant ischemic changes. Cr was 1.28. Cardiology was consulted. Of note, his BP was very elevated to 180s.    Past Medical History:  Diagnosis Date   Arthritis    Hyperlipidemia    Leukemia, chronic lymphocytic (HCC)    SCCA (squamous cell carcinoma) of skin 08/08/2020   Mid Frontal Scalp (in situ) (curet and 5FU)   SCCA (squamous cell carcinoma) of skin 08/08/2020   Mid Parietal Scalp (in situ) (curet and 5FU)   SCCA (squamous cell carcinoma) of skin 08/08/2020   Mid Supratip of Nose (in situ)   Shingles    Squamous cell carcinoma of skin 12/08/2018   right jaw line cis   Vitamin D deficiency     Past Surgical History:  Procedure Laterality Date   CARDIOVERSION N/A 12/16/2019   Procedure: CARDIOVERSION;  Surgeon: Croitoru, Mihai, MD;  Location: [facility];  Service: Cardiovascular;  Laterality: N/A;   EYE SURGERY     LAPAROSCOPIC TRANS ANAL AND TRANSABDOMINAL RECTAL RESECTION WITH COLOANAL ANASTOMOSIS     SURGERY OF  LIP     tumor   TONSILLECTOMY     VASECTOMY         Inpatient Medications: Scheduled Meds:  Continuous Infusions:  PRN Meds: acetaminophen **OR** acetaminophen, melatonin, nitroGLYCERIN, ondansetron (ZOFRAN) IV  Allergies:    Allergies  Allergen Reactions   Azithromycin Itching and Other (See Comments)    Pt stated he thinks this made him dizzy, also    Social History:   Social History   Socioeconomic History   Marital status: Married    Spouse name: Not on file   Number of children: 2   Years of education: Not on file   Highest education level: Not on file  Occupational History   Occupation: Retired    Comment: Professor [employer]  Tobacco Use   Smoking status: Never   Smokeless tobacco: Never  Vaping Use   Vaping Use: Never used  Substance and Sexual Activity   Alcohol use: No   Drug use: No   Sexual activity: Not on file  Other Topics Concern   Not on file  Social History Narrative   Not on file   Social Determinants of Health   Financial Resource Strain: Low Risk  (07/22/2021)   Overall Financial Resource Strain (CARDIA)    Difficulty of Paying Living Expenses: Not hard at all  Food Insecurity: No Food Insecurity (06/27/2022)     Hunger Vital Sign    Worried About Running Out of Food in the Last Year: Never true    Ran Out of Food in the Last Year: Never true  Transportation Needs: No Transportation Needs (06/27/2022)   PRAPARE - Transportation    Lack of Transportation (Medical): No    Lack of Transportation (Non-Medical): No  Physical Activity: Sufficiently Active (07/22/2021)   Exercise Vital Sign    Days of Exercise per Week: 7 days    Minutes of Exercise per Session: 60 min  Stress: No Stress Concern Present (07/22/2021)   [employer] - Occupational Stress Questionnaire    Feeling of Stress : Not at all  Social Connections: Socially Integrated (07/22/2021)   Social Connection and Isolation Panel [NHANES]    Frequency of  Communication with Friends and Family: More than three times a week    Frequency of Social Gatherings with Friends and Family: More than three times a week    Attends Religious Services: More than 4 times per year    Active Member of Clubs or Organizations: Yes    Attends Club or Organization Meetings: More than 4 times per year    Marital Status: Married  Intimate Partner Violence: Not At Risk (06/27/2022)   Humiliation, Afraid, Rape, and Kick questionnaire    Fear of Current or Ex-Partner: No    Emotionally Abused: No    Physically Abused: No    Sexually Abused: No    Family History:   Family History  Problem Relation Age of Onset   Colon cancer Sister    Hyperlipidemia Sister    Stroke Sister    Heart disease Father    Emphysema Mother        never smoker, but exposed to 2nd hand from spouse   Cancer Mother    COPD Maternal Grandmother        never smoker   Stroke Maternal Grandfather    Diabetes Paternal Grandmother    Mental illness Paternal Grandmother    Asthma Sister      ROS:  Please see the history of present illness.  All other ROS reviewed and negative.     Physical Exam/Data:   Vitals:   06/26/22 2310 06/26/22 2315 06/27/22 0037 06/27/22 0101  BP: (!) 176/79 (!) 162/91 (!) 184/99 (!) 182/81  Pulse: 69 74 74   Resp: 16 17 18   Temp:   97.9 F (36.6 C)   TempSrc:   Oral   SpO2: 96% 97% 95%   Weight:   93.7 kg   Height:   5' 11" (1.803 m)    No intake or output data in the 24 hours ending 06/27/22 0406    06/27/2022   12:37 AM 06/26/2022    6:39 PM 04/09/2022    9:53 AM  Last 3 Weights  Weight (lbs) 206 lb 9.1 oz 210 lb 1.6 oz 210 lb  Weight (kg) 93.7 kg 95.3 kg 95.255 kg     Body mass index is 28.81 kg/m.  General:  Well nourished, well developed, in no acute distress HEENT: normal Neck: no JVD Vascular: No carotid bruits; Distal pulses 2+ bilaterally Cardiac:  normal S1, S2; RRR; no murmur  Lungs:  clear to auscultation bilaterally, no  wheezing, rhonchi or rales  Abd: soft, nontender, no hepatomegaly  Ext: no edema Musculoskeletal:  No deformities, BUE and BLE strength normal and equal Skin: warm and dry  Neuro:  CNs 2-12 intact, no focal abnormalities noted Psych:    Normal affect   EKG:  The EKG was personally reviewed and demonstrates: No ST elevation  Relevant CV Studies:  Echo 2021: Normal LVEF  Laboratory Data:  High Sensitivity Troponin:   Recent Labs  Lab 06/26/22 1845 06/26/22 2200  TROPONINIHS 26* 58*     Chemistry Recent Labs  Lab 06/26/22 1845 06/26/22 2200  NA 138  --   K 4.5  --   CL 104  --   CO2 24  --   GLUCOSE 112*  --   BUN 20  --   CREATININE 1.28*  --   CALCIUM 9.2  --   MG  --  2.3  GFRNONAA 55*  --   ANIONGAP 10  --     No results for input(s): "PROT", "ALBUMIN", "AST", "ALT", "ALKPHOS", "BILITOT" in the last 168 hours. Lipids No results for input(s): "CHOL", "TRIG", "HDL", "LABVLDL", "LDLCALC", "CHOLHDL" in the last 168 hours.  Hematology Recent Labs  Lab 06/26/22 1845  WBC 32.4*  RBC 5.44  HGB 16.8  HCT 50.0  MCV 91.9  MCH 30.9  MCHC 33.6  RDW 13.2  PLT 208   Thyroid No results for input(s): "TSH", "FREET4" in the last 168 hours.  BNPNo results for input(s): "BNP", "PROBNP" in the last 168 hours.  DDimer No results for input(s): "DDIMER" in the last 168 hours.   Radiology/Studies:  DG Chest 2 View  Result Date: 06/26/2022 CLINICAL DATA:  Chest pain EXAM: CHEST - 2 VIEW COMPARISON:  CXR 08/04/12 FINDINGS: The heart size and mediastinal contours are within normal limits. No pleural effusion. No pneumothorax. Bibasilar atelectasis. The visualized skeletal structures are unremarkable. IMPRESSION: Bibasilar atelectasis. Electronically Signed   By: Hemant  Desai M.D.   On: 06/26/2022 19:47     Assessment and Plan:   # Chest pain # Elevated troponin # HTN urgency # CLL  -Mild troponin elevation in the setting of high BP. However with concerning story, and risk  factor, needs further work up -Will continue to trend troponin- if continue to rise, will start heparin -Echo in AM -Stress vs LHC in AM depending on troponin trend and symptoms.  -NPO at midnight -Telemetry -Aspirin -Statin -A1C -Lipid Profile -Aggressive BP control with Coreg and Amlodipine.   For questions or updates, please contact [facility] Please consult [email or web address] for contact info under    Signed, Siniya Lichty S Rasha Ibe, MD  06/27/2022 4:06 AM  

## 2022-06-27 NOTE — H&P (View-Only) (Signed)
Cardiology Consultation   Patient ID: NASH RIDPATH MRN: [id number]; DOB: [birth year]  Admit date: 06/26/2022 Date of Consult: 06/27/2022  PCP:  Etta Grandchild, MD   [facility] Providers Cardiologist:  Charlton Haws, MD        Patient Profile:   Francisco Larson is a 87 y.o. male with a hx of CLL and SCC  who is being seen 06/27/2022 for the evaluation of chest pain at the request of Dr. Arlean Hopping  History of Present Illness:   [name] is a 87 y.o. male with a hx of CLL and SCC  who is being seen 06/27/2022 for the evaluation of chest pain at the request of Dr. Arlean Hopping. He had been doing well till this afternoon when he started having chest pain/pressure after his bike exercise. The pain improved with rest. It was associated with nausea. However later in the afternoon, another episode happened. Again with nausea. Hence came to the ED. Currently he denies any CP. Denies any SOB. In the ED, troponin were 26 followed by 58. EKG showed no significant ischemic changes. Cr was 1.28. Cardiology was consulted. Of note, his BP was very elevated to 180s.    Past Medical History:  Diagnosis Date   Arthritis    Hyperlipidemia    Leukemia, chronic lymphocytic (HCC)    SCCA (squamous cell carcinoma) of skin 08/08/2020   Mid Frontal Scalp (in situ) (curet and 5FU)   SCCA (squamous cell carcinoma) of skin 08/08/2020   Mid Parietal Scalp (in situ) (curet and 5FU)   SCCA (squamous cell carcinoma) of skin 08/08/2020   Mid Supratip of Nose (in situ)   Shingles    Squamous cell carcinoma of skin 12/08/2018   right jaw line cis   Vitamin D deficiency     Past Surgical History:  Procedure Laterality Date   CARDIOVERSION N/A 12/16/2019   Procedure: CARDIOVERSION;  Surgeon: Thurmon Fair, MD;  Location: [facility];  Service: Cardiovascular;  Laterality: N/A;   EYE SURGERY     LAPAROSCOPIC TRANS ANAL AND TRANSABDOMINAL RECTAL RESECTION WITH COLOANAL ANASTOMOSIS     SURGERY OF  LIP     tumor   TONSILLECTOMY     VASECTOMY         Inpatient Medications: Scheduled Meds:  Continuous Infusions:  PRN Meds: acetaminophen **OR** acetaminophen, melatonin, nitroGLYCERIN, ondansetron (ZOFRAN) IV  Allergies:    Allergies  Allergen Reactions   Azithromycin Itching and Other (See Comments)    Pt stated he thinks this made him dizzy, also    Social History:   Social History   Socioeconomic History   Marital status: Married    Spouse name: Not on file   Number of children: 2   Years of education: Not on file   Highest education level: Not on file  Occupational History   Occupation: Retired    Comment: Professor [employer]  Tobacco Use   Smoking status: Never   Smokeless tobacco: Never  Vaping Use   Vaping Use: Never used  Substance and Sexual Activity   Alcohol use: No   Drug use: No   Sexual activity: Not on file  Other Topics Concern   Not on file  Social History Narrative   Not on file   Social Determinants of Health   Financial Resource Strain: Low Risk  (07/22/2021)   Overall Financial Resource Strain (CARDIA)    Difficulty of Paying Living Expenses: Not hard at all  Food Insecurity: No Food Insecurity (06/27/2022)  Hunger Vital Sign    Worried About Running Out of Food in the Last Year: Never true    Ran Out of Food in the Last Year: Never true  Transportation Needs: No Transportation Needs (06/27/2022)   PRAPARE - Administrator, Civil Service (Medical): No    Lack of Transportation (Non-Medical): No  Physical Activity: Sufficiently Active (07/22/2021)   Exercise Vital Sign    Days of Exercise per Week: 7 days    Minutes of Exercise per Session: 60 min  Stress: No Stress Concern Present (07/22/2021)   [employer] of Occupational Health - Occupational Stress Questionnaire    Feeling of Stress : Not at all  Social Connections: Socially Integrated (07/22/2021)   Social Connection and Isolation Panel [NHANES]    Frequency of  Communication with Friends and Family: More than three times a week    Frequency of Social Gatherings with Friends and Family: More than three times a week    Attends Religious Services: More than 4 times per year    Active Member of [employer] or Organizations: Yes    Attends Engineer, structural: More than 4 times per year    Marital Status: Married  Catering manager Violence: Not At Risk (06/27/2022)   Humiliation, Afraid, Rape, and Kick questionnaire    Fear of Current or Ex-Partner: No    Emotionally Abused: No    Physically Abused: No    Sexually Abused: No    Family History:   Family History  Problem Relation Age of Onset   Colon cancer Sister    Hyperlipidemia Sister    Stroke Sister    Heart disease Father    Emphysema Mother        never smoker, but exposed to 2nd hand from spouse   Cancer Mother    COPD Maternal Grandmother        never smoker   Stroke Maternal Grandfather    Diabetes Paternal Grandmother    Mental illness Paternal Grandmother    Asthma Sister      ROS:  Please see the history of present illness.  All other ROS reviewed and negative.     Physical Exam/Data:   Vitals:   06/26/22 2310 06/26/22 2315 06/27/22 0037 06/27/22 0101  BP: (!) 176/79 (!) 162/91 (!) 184/99 (!) 182/81  Pulse: 69 74 74   Resp: 16 17 18    Temp:   97.9 F (36.6 C)   TempSrc:   Oral   SpO2: 96% 97% 95%   Weight:   93.7 kg   Height:   5\' 11"  (1.803 m)    No intake or output data in the 24 hours ending 06/27/22 0406    06/27/2022   12:37 AM 06/26/2022    6:39 PM 04/09/2022    9:53 AM  Last 3 Weights  Weight (lbs) 206 lb 9.1 oz 210 lb 1.6 oz 210 lb  Weight (kg) 93.7 kg 95.3 kg 95.255 kg     Body mass index is 28.81 kg/m.  General:  Well nourished, well developed, in no acute distress HEENT: normal Neck: no JVD Vascular: No carotid bruits; Distal pulses 2+ bilaterally Cardiac:  normal S1, S2; RRR; no murmur  Lungs:  clear to auscultation bilaterally, no  wheezing, rhonchi or rales  Abd: soft, nontender, no hepatomegaly  Ext: no edema Musculoskeletal:  No deformities, BUE and BLE strength normal and equal Skin: warm and dry  Neuro:  CNs 2-12 intact, no focal abnormalities noted Psych:  Normal affect   EKG:  The EKG was personally reviewed and demonstrates: No ST elevation  Relevant CV Studies:  Echo 2021: Normal LVEF  Laboratory Data:  High Sensitivity Troponin:   Recent Labs  Lab 06/26/22 1845 06/26/22 2200  TROPONINIHS 26* 58*     Chemistry Recent Labs  Lab 06/26/22 1845 06/26/22 2200  NA 138  --   K 4.5  --   CL 104  --   CO2 24  --   GLUCOSE 112*  --   BUN 20  --   CREATININE 1.28*  --   CALCIUM 9.2  --   MG  --  2.3  GFRNONAA 55*  --   ANIONGAP 10  --     No results for input(s): "PROT", "ALBUMIN", "AST", "ALT", "ALKPHOS", "BILITOT" in the last 168 hours. Lipids No results for input(s): "CHOL", "TRIG", "HDL", "LABVLDL", "LDLCALC", "CHOLHDL" in the last 168 hours.  Hematology Recent Labs  Lab 06/26/22 1845  WBC 32.4*  RBC 5.44  HGB 16.8  HCT 50.0  MCV 91.9  MCH 30.9  MCHC 33.6  RDW 13.2  PLT 208   Thyroid No results for input(s): "TSH", "FREET4" in the last 168 hours.  BNPNo results for input(s): "BNP", "PROBNP" in the last 168 hours.  DDimer No results for input(s): "DDIMER" in the last 168 hours.   Radiology/Studies:  DG Chest 2 View  Result Date: 06/26/2022 CLINICAL DATA:  Chest pain EXAM: CHEST - 2 VIEW COMPARISON:  CXR 08/04/12 FINDINGS: The heart size and mediastinal contours are within normal limits. No pleural effusion. No pneumothorax. Bibasilar atelectasis. The visualized skeletal structures are unremarkable. IMPRESSION: Bibasilar atelectasis. Electronically Signed   By: Lorenza Cambridge M.D.   On: 06/26/2022 19:47     Assessment and Plan:   # Chest pain # Elevated troponin # HTN urgency # CLL  -Mild troponin elevation in the setting of high BP. However with concerning story, and risk  factor, needs further work up -Will continue to trend troponin- if continue to rise, will start heparin -Echo in AM -Stress vs LHC in AM depending on troponin trend and symptoms.  -NPO at midnight -Telemetry -Aspirin -Statin -A1C -Lipid Profile -Aggressive BP control with Coreg and Amlodipine.   For questions or updates, please contact [facility] Please consult [email or web address] for contact info under    Signed, Hermelinda Dellen, MD  06/27/2022 4:06 AM

## 2022-06-27 NOTE — Telephone Encounter (Signed)
   Transition of Care Follow-up Phone Call Request    Patient Name: Francisco Larson Date of Birth: [birth year] Date of Encounter: 06/27/2022  Primary Care Provider:  Etta Grandchild, MD Primary Cardiologist:  Charlton Haws, MD  Francisco Larson has been scheduled for a transition of care follow up appointment with a HeartCare provider: Jari Favre Monday July 07, 2022 10:05 AM (Arrive by 9:50 AM)   He is likely being discharged tomorrow, 06/28/22. Please reach out to Francisco Larson within 48 hours to confirm appointment and review transition of care protocol questionnaire.  Laurann Montana, PA-C  06/27/2022, 2:48 PM

## 2022-06-27 NOTE — Care Management Obs Status (Signed)
MEDICARE OBSERVATION STATUS NOTIFICATION   Patient Details  Name: Francisco Larson MRN: [id number] Date of Birth: 25-Oct-1935   Medicare Observation Status Notification Given:  Yes    Harriet Masson, RN 06/27/2022, 1:36 PM

## 2022-06-27 NOTE — Brief Op Note (Signed)
06/27/2022  2:48 PM  PCP:  Etta Grandchild, MD              [facility] Cardiologist:  Charlton Haws, MD      Referring Cardiologist: Epifanio Lesches, MD  PROCEDURE:  Procedure(s): LEFT HEART CATH AND CORONARY ANGIOGRAPHY (N/A) CORONARY STENT INTERVENTION (N/A)  SURGEON:  Surgeon(s) and Role:    * Marykay Lex, MD - Primary+   PATIENT:  Francisco Larson  87 y.o. male with history of CLL and SCC as well as HLD and prior history of atrial flutter admitted overnight 6/13-14/2024 with signs symptoms concerning for acute coronary syndrome/unstable angina.  Troponin levels were mildly elevated at roughly 50, symptoms were very concerning for initially exertional angina followed by angina at rest.  No dynamic EKG changes, but based on symptoms is referred for invasive evaluation with cardiac catheterization and possible PCI.  PRE-OPERATIVE DIAGNOSIS: Unstable angina  POST-OPERATIVE DIAGNOSIS:   Severe two-vessel disease status post two-vessel PCI Mid LAD 80% after Large 1st Diag (that has prox 30%); mid- distal LAD tapers down to a relatively small caliber vessel and gives off septal perforators but no other diagonal branches. DES PCI: Synergy XD 2.5 mm x 16 mm, postdilated to 2.7 mm Mid LCx (after AV groove branch) 90% stenosis, focal -> LCx courses to be, large 2nd Mrg branch giving off a third marginal branch as well. DES PCI: Synergy XD 2.75 mm x 12 mm, postdilated to 3.1 mm. Large-caliber Ramus that gives off lateral branch; normal Very large caliber Dominant RCA with minimal disease.  Bifurcates distally into large wraparound PDA (with patchy 30 to 40% stenoses) and 1 major PL branch relatively free of disease.Marland Kitchen   PROCEDURE PERFORMED Time Out: Verified patient identification, verified procedure, site/side was marked, verified correct patient position, special equipment/implants available, medications/allergies/relevent history reviewed, required imaging and test  results available. Performed.  Access:  RIGHT Radial Artery: 6 Fr sheath -- Seldinger technique using Micropuncture Kit -- Direct ultrasound guidance used.  Permanent image obtained and placed on chart. -- 10 mL radial cocktail IA; 5000 Units IV Heparin  Left Heart Catheterization: 5Fr Catheters advanced or exchanged over a J-wire under direct fluoroscopic guidance into the ascending aorta; TIG 4.0 catheter advanced first.  * LV Hemodynamics (LV Gram): TIG 4.0 Catheter * Left Coronary Artery Cineangiography: TIG 4.0 Catheter  * Right Coronary Artery Cineangiography: JR4 Catheter   Review of initial angiography revealed: 2V CAD 80% mid LAD, 90% mid LCx  Preparations are made for two-vessel PCI of the LAD and LCx Additional 5000 units IV heparin administered along with 600 mg oral clopidogrel ACT of 250-350 seconds achieved and maintained  Two-vessel PCI performed: See FINDINGS for details XB LAD 3.5 guide catheter: Prowater wire used for both. LAD predilated with 2.25 mm X 12 mm balloon, DES stent-Synergy XD -> 2.5 mm x 16 mm, postdilated to 2.7 mm LCx predilated with 2.5 mm x 12 mm balloon, DES stent-Synergy XD 2.75 mm x 12 mm, deployed to 2.9 mm, postdilated to 3.1 mm; using 3.0 mm x 8 mm [place] balloon. Both lesions reduced to 0%, TIMI-3 flow pre and post.  Upon completion of Angiogaphy, the catheter was removed completely out of the body over a wire, without complication.   Radial sheath removed in the Cardiac Catheterization lab with TR Band placed for hemostasis.  TR Band: 1435 Hours; 11 mL air; reverse Barbeau Bardelau B  MEDICATIONS SQ Lidocaine 4 mL Radial Cocktail: 3 mg Verapmil  in 10 mL NS Heparin: Total 10,000 units IC NTG 200 mcg x 2 Plavix/clopidogrel 600 mg p.o. Pepcid 20 mg IV  ANESTHESIA:   local and IV sedation -> 1 mg Versed, 25 mcg fentanyl  EBL:  < 50mL   DICTATION: .Dragon Dictation and Note written in EPIC  PLAN OF CARE: Discharge to home after PACU =>  patient will actually return to [facility] where he will be monitored until roughly 8 PM at which time his bedrest and IV fluid hydration should be completed.  PATIENT DISPOSITION:  PACU - hemodynamically stable.   Delay start of Pharmacological VTE agent (>24hrs) due to surgical blood loss or risk of bleeding: not applicable    Bryan Lemma, MD

## 2022-06-27 NOTE — Progress Notes (Signed)
PROGRESS NOTE    Francisco Larson  ZOX:[id number] DOB: [birth year] DOA: 06/26/2022 PCP: Etta Grandchild, MD   Brief Narrative:  87 year old with history of CLL, SCC, PACs, HLD, comes to the hospital with complaints of chest pain.  Troponins were mildly elevated, cardiology team was consulted   Assessment & Plan:  Principal Problem:   Chest pain Active Problems:   CLL (chronic lymphocytic leukemia) (HCC)   HLD (hyperlipidemia)   Paroxysmal atrial fibrillation (HCC)   Elevated troponin    Atypical chest pain -Admitted for observation.  EKG does not show any evidence of active ischemia.  Troponins peaked at 58.  Now with troponin trending down, no recommendations for heparin drip.  Echocardiogram ordered, currently NPO.  Cardiology team to see the patient.  Check A1c, lipid panel.  Defer the need for LHC to cardiology  History of CLL - Continue to monitor  Hyperlipidemia - Statin  Paroxysmal atrial fibrillation - Not on long-term anticoagulation.   DVT prophylaxis: Ambulatory Code Status: Full code Family Communication:   Continue to stay until cleared by cardiology       Diet Orders (From admission, onward)     Start     Ordered   06/27/22 0415  Diet NPO time specified  Diet effective now        06/27/22 0414            Subjective: Doing okay no complaints   Examination:  General exam: Appears calm and comfortable  Respiratory system: Clear to auscultation. Respiratory effort normal. Cardiovascular system: S1 & S2 heard, RRR. No JVD, murmurs, rubs, gallops or clicks. No pedal edema. Gastrointestinal system: Abdomen is nondistended, soft and nontender. No organomegaly or masses felt. Normal bowel sounds heard. Central nervous system: Alert and oriented. No focal neurological deficits. Extremities: Symmetric 5 x 5 power. Skin: No rashes, lesions or ulcers Psychiatry: Judgement and insight appear normal. Mood & affect appropriate.  Objective: Vitals:    06/27/22 0037 06/27/22 0101 06/27/22 0409 06/27/22 0500  BP: (!) 184/99 (!) 182/81 (!) 161/70   Pulse: 74  71   Resp: 18  15   Temp: 97.9 F (36.6 C)  97.9 F (36.6 C)   TempSrc: Oral  Oral   SpO2: 95%  95%   Weight: 93.7 kg   93.7 kg  Height: 5\' 11"  (1.803 m)       Intake/Output Summary (Last 24 hours) at 06/27/2022 0725 Last data filed at 06/27/2022 0556 Gross per 24 hour  Intake --  Output 400 ml  Net -400 ml   Filed Weights   06/26/22 1839 06/27/22 0037 06/27/22 0500  Weight: 95.3 kg 93.7 kg 93.7 kg    Scheduled Meds:  amLODipine  5 mg Oral Daily   aspirin  81 mg Oral Daily   atorvastatin  80 mg Oral Daily   carvedilol  3.125 mg Oral BID WC   Continuous Infusions:  Nutritional status     Body mass index is 28.81 kg/m.  Data Reviewed:   CBC: Recent Labs  Lab 06/26/22 1845 06/27/22 0545  WBC 32.4* 23.5*  NEUTROABS  --  PENDING  HGB 16.8 14.5  HCT 50.0 43.3  MCV 91.9 88.5  PLT 208 157   Basic Metabolic Panel: Recent Labs  Lab 06/26/22 1845 06/26/22 2200 06/27/22 0545  NA 138  --  136  K 4.5  --  3.9  CL 104  --  104  CO2 24  --  24  GLUCOSE 112*  --  130*  BUN 20  --  17  CREATININE 1.28*  --  1.08  CALCIUM 9.2  --  8.3*  MG  --  2.3 2.1   GFR: Estimated Creatinine Clearance: 59.6 mL/min (by C-G formula based on SCr of 1.08 mg/dL). Liver Function Tests: Recent Labs  Lab 06/27/22 0545  AST 22  ALT 19  ALKPHOS 79  BILITOT 0.5  PROT 5.3*  ALBUMIN 2.8*   Recent Labs  Lab 06/27/22 0545  LIPASE 107*   No results for input(s): "AMMONIA" in the last 168 hours. Coagulation Profile: No results for input(s): "INR", "PROTIME" in the last 168 hours. Cardiac Enzymes: No results for input(s): "CKTOTAL", "CKMB", "CKMBINDEX", "TROPONINI" in the last 168 hours. BNP (last 3 results) No results for input(s): "PROBNP" in the last 8760 hours. HbA1C: No results for input(s): "HGBA1C" in the last 72 hours. CBG: No results for input(s):  "GLUCAP" in the last 168 hours. Lipid Profile: No results for input(s): "CHOL", "HDL", "LDLCALC", "TRIG", "CHOLHDL", "LDLDIRECT" in the last 72 hours. Thyroid Function Tests: No results for input(s): "TSH", "T4TOTAL", "FREET4", "T3FREE", "THYROIDAB" in the last 72 hours. Anemia Panel: No results for input(s): "VITAMINB12", "FOLATE", "FERRITIN", "TIBC", "IRON", "RETICCTPCT" in the last 72 hours. Sepsis Labs: No results for input(s): "PROCALCITON", "LATICACIDVEN" in the last 168 hours.  Recent Results (from the past 240 hour(s))  MRSA Next Gen by PCR, Nasal     Status: None   Collection Time: 06/27/22 12:45 AM   Specimen: Nasal Mucosa; Nasal Swab  Result Value Ref Range Status   MRSA by PCR Next Gen NOT DETECTED NOT DETECTED Final    Comment: (NOTE) The GeneXpert MRSA Assay (FDA approved for NASAL specimens only), is one component of a comprehensive MRSA colonization surveillance program. It is not intended to diagnose MRSA infection nor to guide or monitor treatment for MRSA infections. Test performance is not FDA approved in patients less than 27 years old. Performed at [facility], [address], [place], [place] [address]          Radiology Studies: DG Chest 2 View  Result Date: 06/26/2022 CLINICAL DATA:  Chest pain EXAM: CHEST - 2 VIEW COMPARISON:  CXR 08/04/12 FINDINGS: The heart size and mediastinal contours are within normal limits. No pleural effusion. No pneumothorax. Bibasilar atelectasis. The visualized skeletal structures are unremarkable. IMPRESSION: Bibasilar atelectasis. Electronically Signed   By: Lorenza Cambridge M.D.   On: 06/26/2022 19:47           LOS: 0 days   Time spent= 35 mins    Melyna Huron Joline Maxcy, MD [facility]  If 7PM-7AM, please contact night-coverage  06/27/2022, 7:25 AM

## 2022-06-27 NOTE — Progress Notes (Addendum)
Received signout from cardmaster that Dr. Herbie Baltimore performed PCI and raised question that patient may be able to be discharged after 8pm. This was reviewed with Dr. Bjorn Pippin who saw patient today. Dr. Bjorn Pippin recommends to observe overnight with plan for DC in AM, given late nature of discharge and advanced age. Dr. Nelson Chimes made aware and agrees. I have arranged TOC APP follow-up on 6/24 Jari Favre PA-C) and put appt info on AVS. Plan relayed to pt and wife who are in agreement. Given cath findings and elevated troponin, Dr. Bjorn Pippin feels this did represent NSTEMI.  Cardiology team will follow again in AM.

## 2022-06-27 NOTE — Interval H&P Note (Signed)
History and Physical Interval Note:  06/27/2022 1:12 PM  Patient seen and examined, agree with plan per Dr Welton Flakes.  Description of episode suggestive of angina, as describes exertional chest pressure while exercising yesterday.  Subsequently had episode of chest pressure at rest.  Currently chest pain-free.  EKG unremarkable.  Mild troponin elevation (26 > 58 > 54).  Has known CAD, calcium score 285 in 2016.  Start heparin gtt. Recommend LHC today.  Also has systolic murmur on exam, suspect aortic stenosis.  Will check echocardiogram   Risks and benefits of cardiac catheterization have been discussed with the patient.  These include bleeding, infection, kidney damage, stroke, heart attack, death.  The patient understands these risks and is willing to proceed.   Francisco Ishikawa, MD   Francisco Larson  has presented today for surgery, with the diagnosis of chest pain=> Acute Coronary Syndrome/Unstable Angina.  The various methods of treatment have been discussed with the patient and family. After consideration of risks, benefits and other options for treatment, the patient has consented to  Procedure(s): LEFT HEART CATH AND CORONARY ANGIOGRAPHY (N/A)  PERCUTANEOUS CORONARY INTERVENTION   as a surgical intervention.  The patient's history has been reviewed, patient examined, no change in status, stable for surgery.  I have reviewed the patient's chart and labs.  Questions were answered to the patient's satisfaction.    Cath Lab Visit (complete for each Cath Lab visit)  Clinical Evaluation Leading to the Procedure:   ACS: Yes.    Non-ACS:    Anginal Classification: CCS III  Anti-ischemic medical therapy: Minimal Therapy (1 class of medications)   Non-Invasive Test Results: No non-invasive testing performed  Prior CABG: No previous CABG     Bryan Lemma

## 2022-06-28 DIAGNOSIS — I249 Acute ischemic heart disease, unspecified: Secondary | ICD-10-CM | POA: Diagnosis not present

## 2022-06-28 DIAGNOSIS — I214 Non-ST elevation (NSTEMI) myocardial infarction: Principal | ICD-10-CM

## 2022-06-28 DIAGNOSIS — Z955 Presence of coronary angioplasty implant and graft: Secondary | ICD-10-CM

## 2022-06-28 DIAGNOSIS — R072 Precordial pain: Secondary | ICD-10-CM | POA: Diagnosis not present

## 2022-06-28 DIAGNOSIS — C9110 Chronic lymphocytic leukemia of B-cell type not having achieved remission: Secondary | ICD-10-CM | POA: Diagnosis not present

## 2022-06-28 LAB — CBC
HCT: 43.3 % (ref 39.0–52.0)
Hemoglobin: 14.3 g/dL (ref 13.0–17.0)
MCH: 29.5 pg (ref 26.0–34.0)
MCHC: 33 g/dL (ref 30.0–36.0)
MCV: 89.3 fL (ref 80.0–100.0)
Platelets: 156 10*3/uL (ref 150–400)
RBC: 4.85 MIL/uL (ref 4.22–5.81)
RDW: 13.2 % (ref 11.5–15.5)
WBC: 20.9 10*3/uL — ABNORMAL HIGH (ref 4.0–10.5)
nRBC: 0 % (ref 0.0–0.2)

## 2022-06-28 LAB — MAGNESIUM: Magnesium: 1.9 mg/dL (ref 1.7–2.4)

## 2022-06-28 LAB — BASIC METABOLIC PANEL
Anion gap: 8 (ref 5–15)
BUN: 15 mg/dL (ref 8–23)
CO2: 23 mmol/L (ref 22–32)
Calcium: 8.1 mg/dL — ABNORMAL LOW (ref 8.9–10.3)
Chloride: 102 mmol/L (ref 98–111)
Creatinine, Ser: 1.04 mg/dL (ref 0.61–1.24)
GFR, Estimated: >60 mL/min (ref >60)
Glucose, Bld: 126 mg/dL — ABNORMAL HIGH (ref 70–99)
Potassium: 4 mmol/L (ref 3.5–5.1)
Sodium: 133 mmol/L — ABNORMAL LOW (ref 135–145)

## 2022-06-28 MED ORDER — ATORVASTATIN CALCIUM 80 MG PO TABS: 80.0000 mg | ORAL_TABLET | Freq: Every day | ORAL | 2 refills | Status: DC

## 2022-06-28 MED ORDER — CARVEDILOL 3.125 MG PO TABS: 3.1250 mg | ORAL_TABLET | Freq: Two times a day (BID) | ORAL | 2 refills | Status: DC

## 2022-06-28 MED ORDER — AMLODIPINE BESYLATE 5 MG PO TABS: 5.0000 mg | ORAL_TABLET | Freq: Every day | ORAL | 3 refills | Status: DC

## 2022-06-28 NOTE — Progress Notes (Addendum)
CARDIAC REHAB PHASE I   PRE:  Rate/Rhythm: 61 SR  BP:  Supine: 140/69   SaO2: 95 RA  MODE:  Ambulation: 340 ft   POST:  Rate/Rhythm: 64 SR  BP:  Sitting: 131/60   SaO2: 94 RA  Pt to standing and ambulated hallway independently with standby assist. Pt a little unsteady while walking, but stated he was not used to walking without shoes. Pt tolerated ambulation well with no s/sx, returned to bed with call bell in reach.  Pt and family in room educated on restrictions, risk factors, exercise guidelines, angina/NTG, stent, MI booklet, antiplatelet therapy, HH nutrition, and CRP2. CRP2 referral placed to [facility]. All questions answered, pt was very active with exercise prior to event and is eager for CRP2. [id number]  Jonna Coup, MS, ACSM-CEP 06/28/2022 10:01 AM

## 2022-06-28 NOTE — Progress Notes (Signed)
Patient provided with verbal discharge instructions. Paper copy of discharge provided to patient. Wife at bedside during d/c instructions.  RN answered all questions. VSS at discharge. IV's removed. Patient belongings sent with patient. Patient dc'd via wheelchair to private vehicle at [employer] by [place].

## 2022-06-28 NOTE — Plan of Care (Signed)
  Problem: Education: Goal: Knowledge of General Education information will improve Description: Including pain rating scale, medication(s)/side effects and non-pharmacologic comfort measures Outcome: Progressing   Problem: Health Behavior/Discharge Planning: Goal: Ability to manage health-related needs will improve Outcome: Progressing   Problem: Clinical Measurements: Goal: Will remain free from infection Outcome: Progressing Goal: Diagnostic test results will improve Outcome: Progressing Goal: Respiratory complications will improve Outcome: Progressing Goal: Cardiovascular complication will be avoided Outcome: Progressing   Problem: Activity: Goal: Risk for activity intolerance will decrease Outcome: Progressing   Problem: Coping: Goal: Level of anxiety will decrease Outcome: Progressing   Problem: Elimination: Goal: Will not experience complications related to bowel motility Outcome: Progressing Goal: Will not experience complications related to urinary retention Outcome: Progressing   Problem: Pain Managment: Goal: General experience of comfort will improve Outcome: Progressing   Problem: Safety: Goal: Ability to remain free from injury will improve Outcome: Progressing

## 2022-06-28 NOTE — Progress Notes (Signed)
Rounding Note    Patient Name: Francisco Larson Date of Encounter: 06/28/2022  [facility] Cardiologist: Charlton Haws, MD   Subjective   Feels well this AM , no CP. Ready for DC  Inpatient Medications    Scheduled Meds:  amLODipine  5 mg Oral Daily   aspirin  81 mg Oral Daily   atorvastatin  80 mg Oral Daily   carvedilol  3.125 mg Oral BID WC   clopidogrel  75 mg Oral Q breakfast   sodium chloride flush  3 mL Intravenous Q12H   sodium chloride flush  3 mL Intravenous Q12H   Continuous Infusions:  sodium chloride     PRN Meds: sodium chloride, acetaminophen **OR** acetaminophen, guaiFENesin, hydrALAZINE, ipratropium-albuterol, melatonin, metoprolol tartrate, nitroGLYCERIN, ondansetron (ZOFRAN) IV, senna-docusate, sodium chloride flush, traZODone   Vital Signs    Vitals:   06/28/22 0500 06/28/22 0540 06/28/22 0600 06/28/22 0731  BP: (!) 156/64   134/73  Pulse: 67  70 68  Resp: 18  19 19   Temp:    98 F (36.7 C)  TempSrc:    Oral  SpO2: 94%  94% 92%  Weight:  92.9 kg    Height:        Intake/Output Summary (Last 24 hours) at 06/28/2022 0907 Last data filed at 06/28/2022 0729 Gross per 24 hour  Intake 731.84 ml  Output 1900 ml  Net -1168.16 ml      06/28/2022    5:40 AM 06/27/2022    5:00 AM 06/27/2022   12:37 AM  Last 3 Weights  Weight (lbs) 204 lb 12.8 oz 206 lb 9.1 oz 206 lb 9.1 oz  Weight (kg) 92.897 kg 93.7 kg 93.7 kg      Telemetry    P waves have low amplitude and has sinus arrhythmia. Sinus rhythm - Personally Reviewed  ECG    06/28/2022- NSR with PACs - Personally Reviewed  Physical Exam   Vitals:   06/28/22 0600 06/28/22 0731  BP:  134/73  Pulse: 70 68  Resp: 19 19  Temp:  98 F (36.7 C)  SpO2: 94% 92%    GEN: No acute distress.   Neck: No JVD Cardiac: RRR, no murmurs, rubs, or gallops.  Respiratory: Clear to auscultation bilaterally. Vasc: R radial pulse 2+, radial site clean and dry GI: Soft, nontender,  non-distended  MS: No edema; No deformity. Neuro:  Nonfocal  Psych: Normal affect   Labs    High Sensitivity Troponin:   Recent Labs  Lab 06/26/22 1845 06/26/22 2200 06/27/22 0545  TROPONINIHS 26* 58* 54*     Chemistry Recent Labs  Lab 06/26/22 1845 06/26/22 2200 06/27/22 0545 06/28/22 0016  NA 138  --  136 133*  K 4.5  --  3.9 4.0  CL 104  --  104 102  CO2 24  --  24 23  GLUCOSE 112*  --  130* 126*  BUN 20  --  17 15  CREATININE 1.28*  --  1.08 1.04  CALCIUM 9.2  --  8.3* 8.1*  MG  --  2.3 2.1 1.9  PROT  --   --  5.3*  --   ALBUMIN  --   --  2.8*  --   AST  --   --  22  --   ALT  --   --  19  --   ALKPHOS  --   --  79  --   BILITOT  --   --  0.5  --   GFRNONAA 55*  --  >60 >60  ANIONGAP 10  --  8 8    Lipids  Recent Labs  Lab 06/27/22 0545  CHOL 122  TRIG 71  HDL 32*  LDLCALC 76  CHOLHDL 3.8    Hematology Recent Labs  Lab 06/26/22 1845 06/27/22 0545 06/28/22 0016  WBC 32.4* 23.5* 20.9*  RBC 5.44 4.89 4.85  HGB 16.8 14.5 14.3  HCT 50.0 43.3 43.3  MCV 91.9 88.5 89.3  MCH 30.9 29.7 29.5  MCHC 33.6 33.5 33.0  RDW 13.2 13.1 13.2  PLT 208 157 156   Thyroid No results for input(s): "TSH", "FREET4" in the last 168 hours.  BNPNo results for input(s): "BNP", "PROBNP" in the last 168 hours.  DDimer No results for input(s): "DDIMER" in the last 168 hours.   Radiology    CARDIAC CATHETERIZATION  Result Date: 06/27/2022   1st Diag lesion is 30% stenosed.   LESION #1: Mid LAD lesion is 80% stenosed.   A drug-eluting stent was successfully placed using a SYNERGY XD 2.50X16 => postdilated to 2.75 mm.  Post intervention, there is a 0% residual stenosis.  TIMI-3 flow pre and post   CULPRIT LESION (#2): Prox Cx lesion is 90% stenosed.   A drug-eluting stent was successfully placed using a SYNERGY XD 2.75X12 => postdilated to 3.1 mm. Post intervention, there is a 0% residual stenosis. TIMI-3 flow pre and post POST-OPERATIVE DIAGNOSIS:  Severe two-vessel disease  status post two-vessel PCI Mid LAD 80% after Large 1st Diag (that has prox 30%); mid- distal LAD tapers down to a relatively small caliber vessel and gives off septal perforators but no other diagonal branches. DES PCI: Synergy XD 2.5 mm x 16 mm, postdilated to 2.7 mm Mid LCx (after AV groove Francisco Larson) 90% stenosis, focal -> LCx courses to be, large 2nd Mrg Francisco Larson giving off a third marginal Francisco Larson as well. DES PCI: Synergy XD 2.75 mm x 12 mm, postdilated to 3.1 mm. Large-caliber Ramus that gives off lateral Francisco Larson; normal Very large caliber Dominant RCA with minimal disease.  Bifurcates distally into large wraparound PDA (with patchy 30 to 40% stenoses) and 1 major PL Francisco Larson relatively free of disease.Marland Kitchen PLAN OF CARE: Discharge to home after PACU => patient will actually return to [facility] where he will be monitored until roughly 8 PM at which time his bedrest and IV fluid hydration should be completed. Francisco Lemma, MD  ECHOCARDIOGRAM COMPLETE  Result Date: 06/27/2022    ECHOCARDIOGRAM REPORT   Patient Name:   Francisco Larson Date of Exam: 06/27/2022 Medical Rec #:  [id number]         Height:       71.0 in Accession #:    [id number]        Weight:       206.6 lb Date of Birth:  05/05/1935         BSA:          2.138 m Patient Age:    86 years          BP:           125/71 mmHg Patient Gender: M                 HR:           61 bpm. Exam Location:  Inpatient Procedure: 2D Echo, Cardiac Doppler and Color Doppler Indications:    Chest pain  History:  Patient has prior history of Echocardiogram examinations, most                 recent 01/21/2020. CKD, hx cancer, Arrythmias:Atrial Fibrillation                 and sick sinus syndrome, Signs/Symptoms:Chest Pain and elevated                 troponin; Risk Factors:Dyslipidemia.  Sonographer:    Francisco Larson Referring Phys: [id number] Francisco Larson IMPRESSIONS  1. Left ventricular ejection fraction, by estimation, is 60 to 65%. The left ventricle has normal function.  The left ventricle has no regional wall motion abnormalities. There is mild asymmetric left ventricular hypertrophy of the basal and septal segments. Left ventricular diastolic parameters were normal.  2. Right ventricular systolic function is normal. The right ventricular size is normal.  3. Left atrial size was moderately dilated.  4. The mitral valve is abnormal. Trivial mitral valve regurgitation. No evidence of mitral stenosis.  5. Partial fusion of right and left cusps Likely moderate AS by AVA calculation despite DVI of only 0.44 and mean gradient 13.7 mmHg . The aortic valve is normal in structure. There is moderate calcification of the aortic valve. There is moderate thickening of the aortic valve. Aortic valve regurgitation is not visualized. Moderate aortic valve stenosis.  6. The inferior vena cava is normal in size with greater than 50% respiratory variability, suggesting right atrial pressure of 3 mmHg. FINDINGS  Left Ventricle: Left ventricular ejection fraction, by estimation, is 60 to 65%. The left ventricle has normal function. The left ventricle has no regional wall motion abnormalities. The left ventricular internal cavity size was normal in size. There is  mild asymmetric left ventricular hypertrophy of the basal and septal segments. Left ventricular diastolic parameters were normal. Right Ventricle: The right ventricular size is normal. No increase in right ventricular wall thickness. Right ventricular systolic function is normal. Left Atrium: Left atrial size was moderately dilated. Right Atrium: Right atrial size was normal in size. Pericardium: There is no evidence of pericardial effusion. Mitral Valve: The mitral valve is abnormal. There is mild thickening of the mitral valve leaflet(s). There is mild calcification of the mitral valve leaflet(s). Mild mitral annular calcification. Trivial mitral valve regurgitation. No evidence of mitral valve stenosis. MV peak gradient, 3.5 mmHg. The mean  mitral valve gradient is 2.0 mmHg. Tricuspid Valve: The tricuspid valve is normal in structure. Tricuspid valve regurgitation is mild . No evidence of tricuspid stenosis. Aortic Valve: Partial fusion of right and left cusps Likely moderate AS by AVA calculation despite DVI of only 0.44 and mean gradient 13.7 mmHg. The aortic valve is normal in structure. There is moderate calcification of the aortic valve. There is moderate thickening of the aortic valve. Aortic valve regurgitation is not visualized. Moderate aortic stenosis is present. Aortic valve mean gradient measures 13.7 mmHg. Aortic valve peak gradient measures 25.3 mmHg. Aortic valve area, by VTI measures 0.99 cm. Pulmonic Valve: The pulmonic valve was normal in structure. Pulmonic valve regurgitation is not visualized. No evidence of pulmonic stenosis. Aorta: The aortic root is normal in size and structure. Venous: The inferior vena cava is normal in size with greater than 50% respiratory variability, suggesting right atrial pressure of 3 mmHg. IAS/Shunts: No atrial level shunt detected by color flow Doppler.  LEFT VENTRICLE PLAX 2D LVIDd:         4.10 cm      Diastology LVIDs:  2.90 cm      LV e' medial:    6.11 cm/s LV PW:         1.10 cm      LV E/e' medial:  14.9 LV IVS:        1.20 cm      LV e' lateral:   8.12 cm/s LVOT diam:     1.70 cm      LV E/e' lateral: 11.2 LV SV:         55 LV SV Index:   26 LVOT Area:     2.27 cm  LV Volumes (MOD) LV vol d, MOD A2C: 105.0 ml LV vol d, MOD A4C: 107.0 ml LV vol s, MOD A2C: 48.9 ml LV vol s, MOD A4C: 47.1 ml LV SV MOD A2C:     56.1 ml LV SV MOD A4C:     107.0 ml LV SV MOD BP:      56.2 ml RIGHT VENTRICLE             IVC RV Basal diam:  5.10 cm     IVC diam: 1.90 cm RV S prime:     14.90 cm/s TAPSE (M-mode): 2.5 cm LEFT ATRIUM             Index        RIGHT ATRIUM           Index LA diam:        5.10 cm 2.39 cm/m   RA Area:     22.70 cm LA Vol (A2C):   71.5 ml 33.45 ml/m  RA Volume:   65.50 ml  30.64  ml/m LA Vol (A4C):   64.0 ml 29.94 ml/m LA Biplane Vol: 67.5 ml 31.58 ml/m  AORTIC VALVE AV Area (Vmax):    1.04 cm AV Area (Vmean):   1.09 cm AV Area (VTI):     0.99 cm AV Vmax:           251.33 cm/s AV Vmean:          177.000 cm/s AV VTI:            0.555 m AV Peak Grad:      25.3 mmHg AV Mean Grad:      13.7 mmHg LVOT Vmax:         115.50 cm/s LVOT Vmean:        84.950 cm/s LVOT VTI:          0.242 m LVOT/AV VTI ratio: 0.44  AORTA Ao Root diam: 3.10 cm Ao Asc diam:  3.40 cm MITRAL VALVE               TRICUSPID VALVE MV Area (PHT): 2.73 cm    TR Peak grad:   21.0 mmHg MV Area VTI:   1.76 cm    TR Vmax:        229.00 cm/s MV Peak grad:  3.5 mmHg MV Mean grad:  2.0 mmHg    SHUNTS MV Vmax:       0.94 m/s    Systemic VTI:  0.24 m MV Vmean:      64.3 cm/s   Systemic Diam: 1.70 cm MV Decel Time: 278 msec MV E velocity: 90.90 cm/s MV A velocity: 94.10 cm/s MV E/A ratio:  0.97 Charlton Haws MD Electronically signed by Charlton Haws MD Signature Date/Time: 06/27/2022/12:24:32 PM    Final    DG Chest 2 View  Result Date: 06/26/2022 CLINICAL DATA:  Chest pain EXAM:  CHEST - 2 VIEW COMPARISON:  CXR 08/04/12 FINDINGS: The heart size and mediastinal contours are within normal limits. No pleural effusion. No pneumothorax. Bibasilar atelectasis. The visualized skeletal structures are unremarkable. IMPRESSION: Bibasilar atelectasis. Electronically Signed   By: Lorenza Cambridge M.D.   On: 06/26/2022 19:47    Cardiac Studies  TTE 06/27/2022 1. Left ventricular ejection fraction, by estimation, is 60 to 65%. The  left ventricle has normal function. The left ventricle has no regional  wall motion abnormalities. There is mild asymmetric left ventricular  hypertrophy of the basal and septal  segments. Left ventricular diastolic parameters were normal.   2. Right ventricular systolic function is normal. The right ventricular  size is normal.   3. Left atrial size was moderately dilated.   4. The mitral valve is abnormal.  Trivial mitral valve regurgitation. No  evidence of mitral stenosis.   5. Partial fusion of right and left cusps Likely moderate AS by AVA  calculation despite DVI of only 0.44 and mean gradient 13.7 mmHg . The  aortic valve is normal in structure. There is moderate calcification of  the aortic valve. There is moderate thickening of the aortic valve. Aortic valve regurgitation is not  visualized. Moderate aortic valve stenosis.   6. The inferior vena cava is normal in size with greater than 50%  respiratory variability, suggesting right atrial pressure of 3 mmHg.   06/27/2022   1st Diag lesion is 30% stenosed.   LESION #1: Mid LAD lesion is 80% stenosed.   A drug-eluting stent was successfully placed using a SYNERGY XD 2.50X16 => postdilated to 2.75 mm.  Post intervention, there is a 0% residual stenosis.  TIMI-3 flow pre and post   CULPRIT LESION (#2): Prox Cx lesion is 90% stenosed.   A drug-eluting stent was successfully placed using a SYNERGY XD 2.75X12 => postdilated to 3.1 mm. Post intervention, there is a 0% residual stenosis. TIMI-3 flow pre and post   POST-OPERATIVE DIAGNOSIS:   Severe two-vessel disease status post two-vessel PCI Mid LAD 80% after Large 1st Diag (that has prox 30%); mid- distal LAD tapers down to a relatively small caliber vessel and gives off septal perforators but no other diagonal branches. DES PCI: Synergy XD 2.5 mm x 16 mm, postdilated to 2.7 mm Mid LCx (after AV groove Gypsy Kellogg) 90% stenosis, focal -> LCx courses to be, large 2nd Mrg Brandelyn Henne giving off a third marginal Hommer Cunliffe as well. DES PCI: Synergy XD 2.75 mm x 12 mm, postdilated to 3.1 mm. Large-caliber Ramus that gives off lateral Arinze Rivadeneira; normal Very large caliber Dominant RCA with minimal disease.  Bifurcates distally into large wraparound PDA (with patchy 30 to 40% stenoses) and 1 major PL Nyasha Rahilly relatively free of disease.Marland Kitchen   PLAN OF CARE: Discharge to home after PACU => patient will actually return to  [facility] where he will be monitored until roughly 8 PM at which time his bedrest and IV fluid hydration should be completed.    Patient Profile     87 y.o. male hx of elevated CAC, atrial flutter, who p/w with angina found to have NSTEMI  Assessment & Plan    NSTEMI - CP free - s/p mid LAD and mid Lcx lesion - continue DAPT for at least 6 months - continue coreg 3.125 mg BID - lipitor 80 mg daily  Atrial Flutter: Post [facility] 12/16/19 with ILR for surveillance anticoagulation d/c by Dr Lalla Brothers Will need ablation or AAT if he has recurrence discovered by ILR  LAst PACEART with no flutter/afib 11/12/21   CLL: leukocytosis chronic. Plts/Hgb nl  HTN: continue norvasc 5 mg daily  He can be discharged from a cardiology standpoint. We will work on FU  For questions or updates, please contact [facility] Please consult [email or web address] for contact info under        Signed, Maisie Fus, MD  06/28/2022, 9:07 AM

## 2022-06-28 NOTE — Plan of Care (Signed)
Problem: Education: Goal: Knowledge of General Education information will improve Description: Including pain rating scale, medication(s)/side effects and non-pharmacologic comfort measures 06/28/2022 1209 by Marlou Sa, RN Outcome: Adequate for Discharge 06/28/2022 1054 by Marlou Sa, RN Outcome: Progressing   Problem: Health Behavior/Discharge Planning: Goal: Ability to manage health-related needs will improve 06/28/2022 1209 by Marlou Sa, RN Outcome: Adequate for Discharge 06/28/2022 1054 by Marlou Sa, RN Outcome: Progressing   Problem: Clinical Measurements: Goal: Ability to maintain clinical measurements within normal limits will improve Outcome: Adequate for Discharge Goal: Will remain free from infection 06/28/2022 1209 by Marlou Sa, RN Outcome: Adequate for Discharge 06/28/2022 1054 by Marlou Sa, RN Outcome: Progressing Goal: Diagnostic test results will improve 06/28/2022 1209 by Marlou Sa, RN Outcome: Adequate for Discharge 06/28/2022 1054 by Marlou Sa, RN Outcome: Progressing Goal: Respiratory complications will improve 06/28/2022 1209 by Marlou Sa, RN Outcome: Adequate for Discharge 06/28/2022 1054 by Marlou Sa, RN Outcome: Progressing Goal: Cardiovascular complication will be avoided 06/28/2022 1209 by Marlou Sa, RN Outcome: Adequate for Discharge 06/28/2022 1054 by Marlou Sa, RN Outcome: Progressing   Problem: Activity: Goal: Risk for activity intolerance will decrease 06/28/2022 1209 by Marlou Sa, RN Outcome: Adequate for Discharge 06/28/2022 1054 by Marlou Sa, RN Outcome: Progressing   Problem: Nutrition: Goal: Adequate nutrition will be maintained Outcome: Adequate for Discharge   Problem: Coping: Goal: Level of anxiety will decrease 06/28/2022 1209 by Marlou Sa, RN Outcome: Adequate  for Discharge 06/28/2022 1054 by Marlou Sa, RN Outcome: Progressing   Problem: Elimination: Goal: Will not experience complications related to bowel motility 06/28/2022 1209 by Marlou Sa, RN Outcome: Adequate for Discharge 06/28/2022 1054 by Marlou Sa, RN Outcome: Progressing Goal: Will not experience complications related to urinary retention 06/28/2022 1209 by Marlou Sa, RN Outcome: Adequate for Discharge 06/28/2022 1054 by Marlou Sa, RN Outcome: Progressing   Problem: Pain Managment: Goal: General experience of comfort will improve 06/28/2022 1209 by Marlou Sa, RN Outcome: Adequate for Discharge 06/28/2022 1054 by Marlou Sa, RN Outcome: Progressing   Problem: Safety: Goal: Ability to remain free from injury will improve 06/28/2022 1209 by Marlou Sa, RN Outcome: Adequate for Discharge 06/28/2022 1054 by Marlou Sa, RN Outcome: Progressing   Problem: Skin Integrity: Goal: Risk for impaired skin integrity will decrease Outcome: Adequate for Discharge   Problem: Education: Goal: Knowledge of General Education information will improve Description: Including pain rating scale, medication(s)/side effects and non-pharmacologic comfort measures Outcome: Adequate for Discharge   Problem: Health Behavior/Discharge Planning: Goal: Ability to manage health-related needs will improve Outcome: Adequate for Discharge   Problem: Clinical Measurements: Goal: Ability to maintain clinical measurements within normal limits will improve Outcome: Adequate for Discharge Goal: Will remain free from infection Outcome: Adequate for Discharge Goal: Diagnostic test results will improve Outcome: Adequate for Discharge Goal: Respiratory complications will improve Outcome: Adequate for Discharge Goal: Cardiovascular complication will be avoided Outcome: Adequate for Discharge   Problem:  Elimination: Goal: Will not experience complications related to bowel motility Outcome: Adequate for Discharge Goal: Will not experience complications related to urinary retention Outcome: Adequate for Discharge   Problem: Pain Managment: Goal: General experience of comfort will improve Outcome: Adequate for Discharge   Problem: Safety: Goal: Ability to remain free from injury will improve Outcome: Adequate for Discharge   Problem: Skin Integrity: Goal: Risk for impaired skin integrity will decrease Outcome:  Adequate for Discharge   Problem: Education: Goal: Understanding of CV disease, CV risk reduction, and recovery process will improve Outcome: Adequate for Discharge Goal: Individualized Educational Video(s) Outcome: Adequate for Discharge   Problem: Activity: Goal: Ability to return to baseline activity level will improve Outcome: Adequate for Discharge   Problem: Cardiovascular: Goal: Ability to achieve and maintain adequate cardiovascular perfusion will improve Outcome: Adequate for Discharge Goal: Vascular access site(s) Level 0-1 will be maintained Outcome: Adequate for Discharge   Problem: Health Behavior/Discharge Planning: Goal: Ability to safely manage health-related needs after discharge will improve Outcome: Adequate for Discharge

## 2022-06-28 NOTE — Discharge Summary (Signed)
Physician Discharge Summary   Patient: Francisco Larson MRN: [id number] DOB: 1935-04-15  Admit date:     06/26/2022  Discharge date: 06/28/22  Discharge Physician: Francisco Larson   PCP: Francisco Grandchild, MD   Recommendations at discharge:   Follow-up cardiology as outpatient  Discharge Diagnoses: Principal Problem:   Acute coronary syndrome (HCC) Active Problems:   CLL (chronic lymphocytic leukemia) (HCC)   HLD (hyperlipidemia)   Benign prostatic hyperplasia without lower urinary tract symptoms   Prediabetes   Elevated troponin   Chest pain  Resolved Problems:   * No resolved hospital problems. *  Hospital Course:  87 year old with history of CLL, SCC, PACs, HLD, comes to the hospital with complaints of chest pain. Troponins were mildly elevated, cardiology team was consulted   Assessment and Plan:  NSTEMI -Chest pain has resolved -Underwent cardiac catheterization, s/p stent to mid LAD and left circumflex lesion\ -Started on aspirin and Plavix for 6 months -Continue Coreg 3.125 mg twice daily, continue Lipitor 80 mg daily -Follow-up cardiology as outpatient  Hypertension -Continue Coreg, Norvasc 5 mg daily  Atrial flutter -Underwent DCCV 12/31, and 4 weeks of anticoagulation -Has not been on anticoagulation  CLL -Chronic -Follow-up with outpatient  Hyperlipidemia -Continue statin       Consultants: Cardiology Procedures performed: Cardiac catheterization Disposition: Home Diet recommendation:  Discharge Diet Orders (From admission, onward)     Start     Ordered   06/28/22 0000  Diet - low sodium heart healthy        06/28/22 1012           Cardiac diet DISCHARGE MEDICATION: Allergies as of 06/28/2022       Reactions   Azithromycin Itching, Other (See Comments)   Pt stated he thinks this made him dizzy, also        Medication List     STOP taking these medications    ibuprofen 200 MG tablet Commonly known as: ADVIL       TAKE  these medications    amLODipine 5 MG tablet Commonly known as: NORVASC Take 1 tablet (5 mg total) by mouth daily. Start taking on: June 29, 2022   aspirin EC 81 MG tablet Take 1 tablet (81 mg total) by mouth daily. Swallow whole.   atorvastatin 80 MG tablet Commonly known as: LIPITOR Take 1 tablet (80 mg total) by mouth daily. Start taking on: June 29, 2022 What changed:  medication strength how much to take how to take this when to take this additional instructions   carvedilol 3.125 MG tablet Commonly known as: COREG Take 1 tablet (3.125 mg total) by mouth 2 (two) times daily with a meal.   CENTRUM SILVER PO Take 1 tablet by mouth daily with breakfast.   clopidogrel 75 MG tablet Commonly known as: Plavix Take 1 tablet (75 mg total) by mouth daily.   Tylenol 325 MG tablet Generic drug: acetaminophen Take 325-650 mg by mouth every 6 (six) hours as needed for mild pain or headache.        Follow-up Information     Francisco Dory, PA-C Follow up.   Specialty: Cardiology Why: Francisco Larson - [address] location - Monday July 07, 2022 at 10:05 AM (Arrive by 9:50 AM). Francisco Larson is one of our PAs that works with Francisco Larson. Contact information: [address] [place] [address] [phone]                Discharge Exam:  Filed Weights   06/27/22 0037 06/27/22 0500 06/28/22 0540  Weight: 93.7 kg 93.7 kg 92.9 kg   General-appears in no acute distress Heart-S1-S2, regular, no murmur auscultated Lungs-clear to auscultation bilaterally, no wheezing or crackles auscultated Abdomen-soft, nontender, no organomegaly Extremities-no edema in the lower extremities Neuro-alert, oriented x3, no focal deficit noted  Condition at discharge: good  The results of significant diagnostics from this hospitalization (including imaging, microbiology, ancillary and laboratory) are listed below for reference.   Imaging Studies: CARDIAC CATHETERIZATION  Result  Date: 06/27/2022   1st Diag lesion is 30% stenosed.   LESION #1: Mid LAD lesion is 80% stenosed.   A drug-eluting stent was successfully placed using a SYNERGY XD 2.50X16 => postdilated to 2.75 mm.  Post intervention, there is a 0% residual stenosis.  TIMI-3 flow pre and post   CULPRIT LESION (#2): Prox Cx lesion is 90% stenosed.   A drug-eluting stent was successfully placed using a SYNERGY XD 2.75X12 => postdilated to 3.1 mm. Post intervention, there is a 0% residual stenosis. TIMI-3 flow pre and post POST-OPERATIVE DIAGNOSIS:  Severe two-vessel disease status post two-vessel PCI Mid LAD 80% after Large 1st Diag (that has prox 30%); mid- distal LAD tapers down to a relatively small caliber vessel and gives off septal perforators but no other diagonal branches. DES PCI: Synergy XD 2.5 mm x 16 mm, postdilated to 2.7 mm Mid LCx (after AV groove branch) 90% stenosis, focal -> LCx courses to be, large 2nd Mrg branch giving off a third marginal branch as well. DES PCI: Synergy XD 2.75 mm x 12 mm, postdilated to 3.1 mm. Large-caliber Ramus that gives off lateral branch; normal Very large caliber Dominant RCA with minimal disease.  Bifurcates distally into large wraparound PDA (with patchy 30 to 40% stenoses) and 1 major PL branch relatively free of disease.Marland Kitchen PLAN OF CARE: Discharge to home after PACU => patient will actually return to [facility] where he will be monitored until roughly 8 PM at which time his bedrest and IV fluid hydration should be completed. Francisco Lemma, MD  ECHOCARDIOGRAM COMPLETE  Result Date: 06/27/2022    ECHOCARDIOGRAM REPORT   Patient Name:   Francisco Larson Date of Exam: 06/27/2022 Medical Rec #:  [id number]         Height:       71.0 in Accession #:    [id number]        Weight:       206.6 lb Date of Birth:  [birth year]         BSA:          2.138 m Patient Age:    86 years          BP:           125/71 mmHg Patient Gender: M                 HR:           61 bpm. Exam Location:  Inpatient  Procedure: 2D Echo, Cardiac Doppler and Color Doppler Indications:    Chest pain  History:        Patient has prior history of Echocardiogram examinations, most                 recent 01/21/2020. CKD, hx cancer, Arrythmias:Atrial Fibrillation                 and sick sinus syndrome, Signs/Symptoms:Chest Pain and elevated  troponin; Risk Factors:Dyslipidemia.  Sonographer:    Wallie Char Referring Phys: [id number] JUSTIN B HOWERTER IMPRESSIONS  1. Left ventricular ejection fraction, by estimation, is 60 to 65%. The left ventricle has normal function. The left ventricle has no regional wall motion abnormalities. There is mild asymmetric left ventricular hypertrophy of the basal and septal segments. Left ventricular diastolic parameters were normal.  2. Right ventricular systolic function is normal. The right ventricular size is normal.  3. Left atrial size was moderately dilated.  4. The mitral valve is abnormal. Trivial mitral valve regurgitation. No evidence of mitral stenosis.  5. Partial fusion of right and left cusps Likely moderate AS by AVA calculation despite DVI of only 0.44 and mean gradient 13.7 mmHg . The aortic valve is normal in structure. There is moderate calcification of the aortic valve. There is moderate thickening of the aortic valve. Aortic valve regurgitation is not visualized. Moderate aortic valve stenosis.  6. The inferior vena cava is normal in size with greater than 50% respiratory variability, suggesting right atrial pressure of 3 mmHg. FINDINGS  Left Ventricle: Left ventricular ejection fraction, by estimation, is 60 to 65%. The left ventricle has normal function. The left ventricle has no regional wall motion abnormalities. The left ventricular internal cavity size was normal in size. There is  mild asymmetric left ventricular hypertrophy of the basal and septal segments. Left ventricular diastolic parameters were normal. Right Ventricle: The right ventricular size is normal.  No increase in right ventricular wall thickness. Right ventricular systolic function is normal. Left Atrium: Left atrial size was moderately dilated. Right Atrium: Right atrial size was normal in size. Pericardium: There is no evidence of pericardial effusion. Mitral Valve: The mitral valve is abnormal. There is mild thickening of the mitral valve leaflet(s). There is mild calcification of the mitral valve leaflet(s). Mild mitral annular calcification. Trivial mitral valve regurgitation. No evidence of mitral valve stenosis. MV peak gradient, 3.5 mmHg. The mean mitral valve gradient is 2.0 mmHg. Tricuspid Valve: The tricuspid valve is normal in structure. Tricuspid valve regurgitation is mild . No evidence of tricuspid stenosis. Aortic Valve: Partial fusion of right and left cusps Likely moderate AS by AVA calculation despite DVI of only 0.44 and mean gradient 13.7 mmHg. The aortic valve is normal in structure. There is moderate calcification of the aortic valve. There is moderate thickening of the aortic valve. Aortic valve regurgitation is not visualized. Moderate aortic stenosis is present. Aortic valve mean gradient measures 13.7 mmHg. Aortic valve peak gradient measures 25.3 mmHg. Aortic valve area, by VTI measures 0.99 cm. Pulmonic Valve: The pulmonic valve was normal in structure. Pulmonic valve regurgitation is not visualized. No evidence of pulmonic stenosis. Aorta: The aortic root is normal in size and structure. Venous: The inferior vena cava is normal in size with greater than 50% respiratory variability, suggesting right atrial pressure of 3 mmHg. IAS/Shunts: No atrial level shunt detected by color flow Doppler.  LEFT VENTRICLE PLAX 2D LVIDd:         4.10 cm      Diastology LVIDs:         2.90 cm      LV e' medial:    6.11 cm/s LV PW:         1.10 cm      LV E/e' medial:  14.9 LV IVS:        1.20 cm      LV e' lateral:   8.12 cm/s LVOT diam:  1.70 cm      LV E/e' lateral: 11.2 LV SV:         55 LV  SV Index:   26 LVOT Area:     2.27 cm  LV Volumes (MOD) LV vol d, MOD A2C: 105.0 ml LV vol d, MOD A4C: 107.0 ml LV vol s, MOD A2C: 48.9 ml LV vol s, MOD A4C: 47.1 ml LV SV MOD A2C:     56.1 ml LV SV MOD A4C:     107.0 ml LV SV MOD BP:      56.2 ml RIGHT VENTRICLE             IVC RV Basal diam:  5.10 cm     IVC diam: 1.90 cm RV S prime:     14.90 cm/s TAPSE (M-mode): 2.5 cm LEFT ATRIUM             Index        RIGHT ATRIUM           Index LA diam:        5.10 cm 2.39 cm/m   RA Area:     22.70 cm LA Vol (A2C):   71.5 ml 33.45 ml/m  RA Volume:   65.50 ml  30.64 ml/m LA Vol (A4C):   64.0 ml 29.94 ml/m LA Biplane Vol: 67.5 ml 31.58 ml/m  AORTIC VALVE AV Area (Vmax):    1.04 cm AV Area (Vmean):   1.09 cm AV Area (VTI):     0.99 cm AV Vmax:           251.33 cm/s AV Vmean:          177.000 cm/s AV VTI:            0.555 m AV Peak Grad:      25.3 mmHg AV Mean Grad:      13.7 mmHg LVOT Vmax:         115.50 cm/s LVOT Vmean:        84.950 cm/s LVOT VTI:          0.242 m LVOT/AV VTI ratio: 0.44  AORTA Ao Root diam: 3.10 cm Ao Asc diam:  3.40 cm MITRAL VALVE               TRICUSPID VALVE MV Area (PHT): 2.73 cm    TR Peak grad:   21.0 mmHg MV Area VTI:   1.76 cm    TR Vmax:        229.00 cm/s MV Peak grad:  3.5 mmHg MV Mean grad:  2.0 mmHg    SHUNTS MV Vmax:       0.94 m/s    Systemic VTI:  0.24 m MV Vmean:      64.3 cm/s   Systemic Diam: 1.70 cm MV Decel Time: 278 msec MV E velocity: 90.90 cm/s MV A velocity: 94.10 cm/s MV E/A ratio:  0.97 Charlton Haws MD Electronically signed by Charlton Haws MD Signature Date/Time: 06/27/2022/12:24:32 PM    Final    DG Chest 2 View  Result Date: 06/26/2022 CLINICAL DATA:  Chest pain EXAM: CHEST - 2 VIEW COMPARISON:  CXR 08/04/12 FINDINGS: The heart size and mediastinal contours are within normal limits. No pleural effusion. No pneumothorax. Bibasilar atelectasis. The visualized skeletal structures are unremarkable. IMPRESSION: Bibasilar atelectasis. Electronically Signed   By:  Lorenza Cambridge M.D.   On: 06/26/2022 19:47   CUP PACEART REMOTE DEVICE CHECK  Result Date: 06/05/2022 ILR summary report received. Battery  status OK. Normal device function. No new symptom, tachy, brady, or pause episodes. Monthly summary reports and ROV/PRN. 2 New AF episodes, longest 12 min, Per Epic: hx of AF and not on OAC. MC, CVRS.   Microbiology: Results for orders placed or performed during the hospital encounter of 06/26/22  MRSA Next Gen by PCR, Nasal     Status: None   Collection Time: 06/27/22 12:45 AM   Specimen: Nasal Mucosa; Nasal Swab  Result Value Ref Range Status   MRSA by PCR Next Gen NOT DETECTED NOT DETECTED Final    Comment: (NOTE) The GeneXpert MRSA Assay (FDA approved for NASAL specimens only), is one component of a comprehensive MRSA colonization surveillance program. It is not intended to diagnose MRSA infection nor to guide or monitor treatment for MRSA infections. Test performance is not FDA approved in patients less than 1 years old. Performed at [facility], [address], [place], [place] [address]     Labs: CBC: Recent Labs  Lab 06/26/22 1845 06/27/22 0545 06/28/22 0016  WBC 32.4* 23.5* 20.9*  NEUTROABS  --  6.8  --   HGB 16.8 14.5 14.3  HCT 50.0 43.3 43.3  MCV 91.9 88.5 89.3  PLT 208 157 156   Basic Metabolic Panel: Recent Labs  Lab 06/26/22 1845 06/26/22 2200 06/27/22 0545 06/28/22 0016  NA 138  --  136 133*  K 4.5  --  3.9 4.0  CL 104  --  104 102  CO2 24  --  24 23  GLUCOSE 112*  --  130* 126*  BUN 20  --  17 15  CREATININE 1.28*  --  1.08 1.04  CALCIUM 9.2  --  8.3* 8.1*  MG  --  2.3 2.1 1.9   Liver Function Tests: Recent Labs  Lab 06/27/22 0545  AST 22  ALT 19  ALKPHOS 79  BILITOT 0.5  PROT 5.3*  ALBUMIN 2.8*   CBG: No results for input(s): "GLUCAP" in the last 168 hours.  Discharge time spent: greater than 30 minutes.  Signed: Meredeth Ide, MD [facility] 06/28/2022

## 2022-06-29 NOTE — Progress Notes (Signed)
  Subjective:  Patient ID: [name], male    DOB: [birth year],  MRN: [id number]  [name] presents to clinic today for: painful elongated mycotic toenails 1-5 bilaterally which are tender when wearing enclosed shoe gear. Pain is relieved with periodic professional debridement. He and his wife will be celebrating their wedding anniversary this weekend. Chief Complaint  Patient presents with   NAIL CARE    RFC     PCP is Etta Grandchild, MD.  Allergies  Allergen Reactions   Azithromycin Itching and Other (See Comments)    Pt stated he thinks this made him dizzy, also    Review of Systems: Negative except as noted in the HPI.  Objective: No changes noted in today's physical examination. Vitals:   06/25/22 1440  BP: (!) 140/69  Pulse: 60    Tage JUSTAN STUTZMAN is a pleasant 87 y.o. male in NAD. AAO x 3.  Vascular Examination: Capillary refill time <3 seconds b/l LE. Palpable pedal pulses b/l LE. Digital hair present b/l. No pedal edema b/l. Skin temperature gradient WNL b/l. No varicosities b/l. Marland Kitchen  Dermatological Examination: Pedal skin with normal turgor, texture and tone b/l. No open wounds. No interdigital macerations b/l. Toenails 1-5 b/l thickened, discolored, dystrophic with subungual debris. There is pain on palpation to dorsal aspect of nailplates. No hyperkeratotic nor porokeratotic lesions present on today's visit.Marland Kitchen  Neurological Examination: Protective sensation intact with 10 gram monofilament b/l LE. Vibratory sensation intact b/l LE.   Musculoskeletal Examination: Normal muscle strength 5/5 to all lower extremity muscle groups bilaterally. No pain, crepitus or joint limitation noted with ROM b/l LE. No gross bony pedal deformities b/l. Patient ambulates independently without assistive aids.     Latest Ref Rng & Units 06/26/2022    6:45 PM 04/09/2022   10:30 AM  Hemoglobin A1C  Hemoglobin-A1c 4.8 - 5.6 % 5.7  6.1    Assessment/Plan: 1. Pain due  to onychomycosis of toenails of both feet     Patient was evaluated and treated. All patient's and/or POA's questions/concerns addressed on today's visit. Toenails 1-5 debrided in length and girth without incident. Continue soft, supportive shoe gear daily. Report any pedal injuries to medical professional. Call office if there are any questions/concerns. -Continue foot and shoe inspections daily. Monitor blood glucose per PCP/Endocrinologist's recommendations. -Patient/POA to call should there be question/concern in the interim.   Return in about 3 months (around 09/25/2022).  Freddie Breech, DPM

## 2022-06-30 ENCOUNTER — Encounter (HOSPITAL_COMMUNITY): Payer: Self-pay | Admitting: Cardiology

## 2022-06-30 NOTE — Telephone Encounter (Signed)
Patient contacted regarding discharge from [facility] on 06/28/2022.  Patient understands to follow up with provider Jari Favre on 07/07/2022 at  at [facility] Patient understands discharge instructions? YES Patient understands medications and regiment? YES Patient understands to bring all medications to this visit? YES  Ask patient:  Are you enrolled in My Chart YES

## 2022-07-01 ENCOUNTER — Encounter: Payer: Self-pay | Admitting: *Deleted

## 2022-07-01 ENCOUNTER — Telehealth: Payer: Self-pay | Admitting: *Deleted

## 2022-07-01 LAB — LIPOPROTEIN A (LPA): Lipoprotein (a): 8.5 nmol/L (ref <75.0)

## 2022-07-01 NOTE — Transitions of Care (Post Inpatient/ED Visit) (Signed)
07/01/2022  Name: Francisco Larson MRN: [id number] DOB: May 07, 1935  Today's TOC FU Call Status: Today's TOC FU Call Status:: Successful TOC FU Call Competed TOC FU Call Complete Date: 07/01/22  Transition Care Management Follow-up Telephone Call Date of Discharge: 06/28/22 Discharge Facility: [facility]) Type of Discharge: Inpatient Admission Primary Inpatient Discharge Diagnosis:: NSTEMI/ ACS/ chest pain; cardiac cath with PCI How have you been since you were released from the hospital?: Better ("I am doing fine, not having any problems and everything is going fine") Any questions or concerns?: No  Items Reviewed: Did you receive and understand the discharge instructions provided?: Yes (thoroughly reviewed with patient who verbalizes good understanding of same) Medications obtained,verified, and reconciled?: Yes (Medications Reviewed) (Full medication reconciliation/ review completed; no concerns or discrepancies identified; confirmed patient obtained/ is taking all newly Rx'd medications as instructed; self-manages medications and denies questions/ concerns around medications today) Any new allergies since your discharge?: No Dietary orders reviewed?: Yes Type of Diet Ordered:: Heart Healthy/ Low salt Do you have support at home?: Yes People in Home: spouse Name of Support/Comfort Primary Source: Reports independent in self-care activities- lives at [facility]; resides with supportive spouse assists as/ if needed/ indicated  Medications Reviewed Today: Medications Reviewed Today     Reviewed by Michaela Corner, RN (Registered Nurse) on 07/01/22 at 1203  Med List Status: <None>   Medication Order Taking? Sig Documenting Provider Last Dose Status Informant  amLODipine (NORVASC) 5 MG tablet [id number] Yes Take 1 tablet (5 mg total) by mouth daily. Meredeth Ide, MD Taking Active   aspirin EC 81 MG tablet [id number] Yes Take 1 tablet (81 mg total) by mouth daily.  Swallow whole. Marykay Lex, MD Taking Active   atorvastatin (LIPITOR) 80 MG tablet [id number] Yes Take 1 tablet (80 mg total) by mouth daily. Meredeth Ide, MD Taking Active   carvedilol (COREG) 3.125 MG tablet [id number] Yes Take 1 tablet (3.125 mg total) by mouth 2 (two) times daily with a meal. Sharl Ma, Sarina Ill, MD Taking Active   clopidogrel (PLAVIX) 75 MG tablet [id number] Yes Take 1 tablet (75 mg total) by mouth daily. Marykay Lex, MD Taking Active   Multiple Vitamins-Minerals (CENTRUM SILVER PO) [id number] Yes Take 1 tablet by mouth daily with breakfast. [provider] Taking Active Self  TYLENOL 325 MG tablet [id number] Yes Take 325-650 mg by mouth every 6 (six) hours as needed for mild pain or headache. [provider] Taking Active Self           Home Care and Equipment/Supplies: Were Home Health Services Ordered?: No Any new equipment or medical supplies ordered?: No  Functional Questionnaire: Do you need assistance with bathing/showering or dressing?: No Do you need assistance with meal preparation?: No Do you need assistance with eating?: No Do you have difficulty maintaining continence: No Do you need assistance with getting out of bed/getting out of a chair/moving?: No Do you have difficulty managing or taking your medications?: No  Follow up appointments reviewed: PCP Follow-up appointment confirmed?: NA (verified not indicated per hospital discharging provider discharge notes) Specialist Hospital Follow-up appointment confirmed?: Yes Date of Specialist follow-up appointment?: 07/07/22 Follow-Up Specialty Provider:: cardiology provider Do you need transportation to your follow-up appointment?: No Do you understand care options if your condition(s) worsen?: Yes-patient verbalized understanding  SDOH Interventions Today    Flowsheet Row Most Recent Value  SDOH Interventions   Food Insecurity Interventions Intervention Not Indicated   Transportation  Interventions Intervention Not Indicated  [normally drives self,  wife assisting after recent hospitalization]      TOC Interventions Today    Flowsheet Row Most Recent Value  TOC Interventions   TOC Interventions Discussed/Reviewed TOC Interventions Discussed  [Patient declines need for ongoing/ further care coordination outreach,  no care coordination needs identified at time of TOC call today,  provided my direct contact information should questions/ concerns/ needs arise post-TOC call]      Interventions Today    Flowsheet Row Most Recent Value  Chronic Disease   Chronic disease during today's visit Hypertension (HTN), Other  [NSTEMI]  General Interventions   General Interventions Discussed/Reviewed General Interventions Discussed, Doctor Visits, Durable Medical Equipment (DME)  Doctor Visits Discussed/Reviewed Specialist, Doctor Visits Discussed, PCP  Durable Medical Equipment (DME) Other  [verified does not currently require assistive devices]  PCP/Specialist Visits Compliance with follow-up visit  Exercise Interventions   Exercise Discussed/Reviewed Exercise Discussed  Education Interventions   Education Provided Provided Education  Provided Verbal Education On Medication, Other, Nutrition  [purpose of newly prescribed medications,  signs/ symptoms of low blood pressure along with corresponding action plan,  importance of following heart healthy diet,  need to resume physical exercise gradually]  Nutrition Interventions   Nutrition Discussed/Reviewed Nutrition Discussed  Pharmacy Interventions   Pharmacy Dicussed/Reviewed Pharmacy Topics Discussed  [Full medication review with updating medication list in EHR per patient report]  Safety Interventions   Safety Discussed/Reviewed Safety Discussed      Caryl Pina, RN, BSN, CCRN Alumnus RN CM Care Coordination/ Transition of Care- [facility] Care Management [phone]: direct office

## 2022-07-04 ENCOUNTER — Telehealth (HOSPITAL_COMMUNITY): Payer: Self-pay

## 2022-07-04 NOTE — Telephone Encounter (Signed)
Pt insurance is active and benefits verified through [facility]. Co-pay $20.00, DED $0.00/$0.00 met, out of pocket $4,000.00/$140.00 met, co-insurance 0%. No pre-authorization required. Passport, 07/04/22 @ 4:04PM, REF#[id number]   How many CR sessions are covered? (36 visits for TCR, 72 visits for ICR)72 Is this a lifetime maximum or an annual maximum? Annual Has the member used any of these services to date? No Is there a time limit (weeks/months) on start of program and/or program completion? No     Will contact patient to see if he is interested in the Cardiac Rehab Program. If interested, patient will need to complete follow up appt. Once completed, patient will be contacted for scheduling upon review by the RN Navigator.

## 2022-07-04 NOTE — Telephone Encounter (Signed)
Called patient to see if he is interested in the Cardiac Rehab Program. Patient expressed interest. Explained scheduling process and went over insurance, patient verbalized understanding. Will contact patient for scheduling once f/u has been completed.  °

## 2022-07-04 NOTE — Progress Notes (Signed)
Carelink Summary Report / Loop Recorder 

## 2022-07-06 NOTE — Progress Notes (Unsigned)
  Cardiology Office Note:  .   Date:  07/06/2022  ID:  Francisco Larson, DOB 1935-04-11, MRN [id number] PCP: Etta Grandchild, MD  [facility] Providers Cardiologist:  Charlton Haws, MD {  History of Present Illness: .   Francisco Larson is a 87 y.o. male with a past medical history of PACs, CAD (calcium score 285 in 2016), HLD, and CLL who presents for follow-up appointment.  Last seen in the office November 2023.  History includes WBC typically in the 25 K range with no Rx needed since 2003.  Strong family history of premature CAD.  He was very active at that time going to [facility] 5 times a week and golfs.  Some arthritis in the left knee which limits his activity.  Has had chronic ACL tear and sees Dr. Despina Hick.  Golfing once a week with a large group.  Calcium score 03/13/2014 was 285.  61st percentile for age and matched sex controls.  Carotid duplex 08/25/2017 with no stenosis.  Had atrial flutter October 2021 and seen by Dr. Lalla Brothers.  DCCV on 12/16/2019 with Dr. Wannetta Sender plan was to stop anticoagulation at the end of the month monitor for any reoccurrence with ILR.  Thought to be at increased risk of bleeding due to age and CLL.  Had moved to a friend's home with his wife and uses their gym for cardio.  No chest pain at that time.  He presented to the ER 06/28/2022 with chest pain.  Troponins were mildly elevated.  Cardiology was consulted.  Underwent cardiac catheterization now status post stent to mid LAD and left circumflex lesion.  Started on aspirin and Plavix (planned for 6 months).  Today, he***  ROS: ***  Studies Reviewed: .        *** Risk Assessment/Calculations:   {Does this patient have ATRIAL FIBRILLATION?:[phone]} No BP recorded.  {Refresh Note OR Click here to enter BP  :1}***       Physical Exam:   VS:  There were no vitals taken for this visit.   Wt Readings from Last 3 Encounters:  06/28/22 204 lb 12.8 oz (92.9 kg)  04/09/22 210 lb (95.3 kg)   12/02/21 208 lb (94.3 kg)    GEN: Well nourished, well developed in no acute distress NECK: No JVD; No carotid bruits CARDIAC: ***RRR, no murmurs, rubs, gallops RESPIRATORY:  Clear to auscultation without rales, wheezing or rhonchi  ABDOMEN: Soft, non-tender, non-distended EXTREMITIES:  No edema; No deformity   ASSESSMENT AND PLAN: .   1.  Recent NSTEMI with PCI to LAD 2. Family history of CAD 3.  HLD 4.  CLL 5.  Left bruit 6.  Atrial flutter   {The patient has an active order for outpatient cardiac rehabilitation.   Please indicate if the patient is ready to start. Do NOT delete this.  It will auto delete.  Refresh note, then sign.              Click here to document readiness and see contraindications.  :1}  Cardiac Rehabilitation Eligibility Assessment      {Are you ordering a CV Procedure (e.g. stress test, cath, DCCV, TEE, etc)?   Press F2        :[id number]}  Dispo: ***  Signed, Sharlene Dory, PA-C

## 2022-07-07 ENCOUNTER — Ambulatory Visit: Payer: Medicare PPO | Attending: Physician Assistant | Admitting: Physician Assistant

## 2022-07-07 ENCOUNTER — Encounter: Payer: Self-pay | Admitting: Physician Assistant

## 2022-07-07 VITALS — BP 142/58 | HR 54 | Ht 71.5 in | Wt 206.0 lb

## 2022-07-07 DIAGNOSIS — I4892 Unspecified atrial flutter: Secondary | ICD-10-CM | POA: Diagnosis not present

## 2022-07-07 DIAGNOSIS — E785 Hyperlipidemia, unspecified: Secondary | ICD-10-CM

## 2022-07-07 DIAGNOSIS — I10 Essential (primary) hypertension: Secondary | ICD-10-CM | POA: Diagnosis not present

## 2022-07-07 DIAGNOSIS — I2510 Atherosclerotic heart disease of native coronary artery without angina pectoris: Secondary | ICD-10-CM | POA: Diagnosis not present

## 2022-07-07 DIAGNOSIS — R0989 Other specified symptoms and signs involving the circulatory and respiratory systems: Secondary | ICD-10-CM

## 2022-07-07 DIAGNOSIS — C9110 Chronic lymphocytic leukemia of B-cell type not having achieved remission: Secondary | ICD-10-CM

## 2022-07-07 MED ORDER — NITROGLYCERIN 0.4 MG SL SUBL: 0.4000 mg | SUBLINGUAL_TABLET | SUBLINGUAL | 3 refills | Status: AC | PRN

## 2022-07-07 NOTE — Patient Instructions (Addendum)
Medication Instructions:    Your physician recommends that you continue on your current medications as directed. Please refer to the Current Medication list given to you today.   *If you need a refill on your cardiac medications before your next appointment, please call your pharmacy*   Lab Work:  RETURN IN 3 MONTHS FOR FASTING LABS LIPID LIVER    If you have labs (blood work) drawn today and your tests are completely normal, you will receive your results only by: MyChart Message (if you have MyChart) OR A paper copy in the mail If you have any lab test that is abnormal or we need to change your treatment, we will call you to review the results.   Testing/Procedures: NONE ORDERED  TODAY    Follow-Up: At [facility], you and your health needs are our priority.  As part of our continuing mission to provide you with exceptional heart care, we have created designated Provider Care Teams.  These Care Teams include your primary Cardiologist (physician) and Advanced Practice Providers (APPs -  Physician Assistants and Nurse Practitioners) who all work together to provide you with the care you need, when you need it.  We recommend signing up for the patient portal called "MyChart".  Sign up information is provided on this After Visit Summary.  MyChart is used to connect with patients for Virtual Visits (Telemedicine).  Patients are able to view lab/test results, encounter notes, upcoming appointments, etc.  Non-urgent messages can be sent to your provider as well.   To learn more about what you can do with MyChart, go to [email or web address].    Your next appointment:  You have been referred to [facility]   Dr Lalla Brothers next available patient overdue .    3-4  month(s)  Provider:   Charlton Haws, MD     Other Instructions  Heart-Healthy Eating Plan Eating a healthy diet is important for the health of your heart. A heart-healthy eating plan includes: Eating less unhealthy  fats. Eating more healthy fats. Eating less salt in your food. Salt is also called sodium. Making other changes in your diet. Talk with your doctor or a diet specialist (dietitian) to create an eating plan that is right for you. What is my plan? Your doctor may recommend an eating plan that includes: Total fat: ______% or less of total calories a day. Saturated fat: ______% or less of total calories a day. Cholesterol: less than _________mg a day. Sodium: less than _________mg a day. What are tips for following this plan? Cooking Avoid frying your food. Try to bake, boil, grill, or broil it instead. You can also reduce fat by: Removing the skin from poultry. Removing all visible fats from meats. Steaming vegetables in water or broth. Meal planning  At meals, divide your plate into four equal parts: Fill one-half of your plate with vegetables and green salads. Fill one-fourth of your plate with whole grains. Fill one-fourth of your plate with lean protein foods. Eat 2-4 cups of vegetables per day. One cup of vegetables is: 1 cup (91 g) broccoli or cauliflower florets. 2 medium carrots. 1 large bell pepper. 1 large sweet potato. 1 large tomato. 1 medium white potato. 2 cups (150 g) raw leafy greens. Eat 1-2 cups of fruit per day. One cup of fruit is: 1 small apple 1 large banana 1 cup (237 g) mixed fruit, 1 large orange,  cup (82 g) dried fruit, 1 cup (240 mL) 100% fruit juice. Eat more foods  that have soluble fiber. These are apples, broccoli, carrots, beans, peas, and barley. Try to get 20-30 g of fiber per day. Eat 4-5 servings of nuts, legumes, and seeds per week: 1 serving of dried beans or legumes equals  cup (90 g) cooked. 1 serving of nuts is  oz (12 almonds, 24 pistachios, or 7 walnut halves). 1 serving of seeds equals  oz (8 g). General information Eat more home-cooked food. Eat less restaurant, buffet, and fast food. Limit or avoid alcohol. Limit foods  that are high in starch and sugar. Avoid fried foods. Lose weight if you are overweight. Keep track of how much salt (sodium) you eat. This is important if you have high blood pressure. Ask your doctor to tell you more about this. Try to add vegetarian meals each week. Fats Choose healthy fats. These include olive oil and canola oil, flaxseeds, walnuts, almonds, and seeds. Eat more omega-3 fats. These include salmon, mackerel, sardines, tuna, flaxseed oil, and ground flaxseeds. Try to eat fish at least 2 times each week. Check food labels. Avoid foods with trans fats or high amounts of saturated fat. Limit saturated fats. These are often found in animal products, such as meats, butter, and cream. These are also found in plant foods, such as palm oil, palm kernel oil, and coconut oil. Avoid foods with partially hydrogenated oils in them. These have trans fats. Examples are stick margarine, some tub margarines, cookies, crackers, and other baked goods. What foods should I eat? Fruits All fresh, canned (in natural juice), or frozen fruits. Vegetables Fresh or frozen vegetables (raw, steamed, roasted, or grilled). Green salads. Grains Most grains. Choose whole wheat and whole grains most of the time. Rice and pasta, including brown rice and pastas made with whole wheat. Meats and other proteins Lean, well-trimmed beef, veal, pork, and lamb. Chicken and [place] without skin. All fish and shellfish. Wild duck, rabbit, pheasant, and venison. Egg whites or low-cholesterol egg substitutes. Dried beans, peas, lentils, and tofu. Seeds and most nuts. Dairy Low-fat or nonfat cheeses, including ricotta and mozzarella. Skim or 1% milk that is liquid, powdered, or evaporated. Buttermilk that is made with low-fat milk. Nonfat or low-fat yogurt. Fats and oils Non-hydrogenated (trans-free) margarines. Vegetable oils, including soybean, sesame, sunflower, olive, peanut, safflower, corn, canola, and cottonseed.  Salad dressings or mayonnaise made with a vegetable oil. Beverages Mineral water. Coffee and tea. Diet carbonated beverages. Sweets and desserts Sherbet, gelatin, and fruit ice. Small amounts of dark chocolate. Limit all sweets and desserts. Seasonings and condiments All seasonings and condiments. The items listed above may not be a complete list of foods and drinks you can eat. Contact a dietitian for more options. What foods should I avoid? Fruits Canned fruit in heavy syrup. Fruit in cream or butter sauce. Fried fruit. Limit coconut. Vegetables Vegetables cooked in cheese, cream, or butter sauce. Fried vegetables. Grains Breads that are made with saturated or trans fats, oils, or whole milk. Croissants. Sweet rolls. Donuts. High-fat crackers, such as cheese crackers. Meats and other proteins Fatty meats, such as hot dogs, ribs, sausage, bacon, rib-eye roast or steak. High-fat deli meats, such as salami and bologna. Caviar. Domestic duck and goose. Organ meats, such as liver. Dairy Cream, sour cream, cream cheese, and creamed cottage cheese. Whole-milk cheeses. Whole or 2% milk that is liquid, evaporated, or condensed. Whole buttermilk. Cream sauce or high-fat cheese sauce. Yogurt that is made from whole milk. Fats and oils Meat fat, or shortening. Cocoa butter, hydrogenated  oils, palm oil, coconut oil, palm kernel oil. Solid fats and shortenings, including bacon fat, salt pork, lard, and butter. Nondairy cream substitutes. Salad dressings with cheese or sour cream. Beverages Regular sodas and juice drinks with added sugar. Sweets and desserts Frosting. Pudding. Cookies. Cakes. Pies. Milk chocolate or white chocolate. Buttered syrups. Full-fat ice cream or ice cream drinks. The items listed above may not be a complete list of foods and drinks to avoid. Contact a dietitian for more information. Summary Heart-healthy meal planning includes eating less unhealthy fats, eating more healthy  fats, and making other changes in your diet. Eat a balanced diet. This includes fruits and vegetables, low-fat or nonfat dairy, lean protein, nuts and legumes, whole grains, and heart-healthy oils and fats. This information is not intended to replace advice given to you by your health care provider. Make sure you discuss any questions you have with your health care provider. Document Revised: 02/04/2021 Document Reviewed: 02/04/2021 Elsevier Patient Education  2024 [employer].

## 2022-07-08 LAB — CUP PACEART REMOTE DEVICE CHECK
Date Time Interrogation Session: 20240624231029
Implantable Pulse Generator Implant Date: 20211221

## 2022-07-11 ENCOUNTER — Telehealth (HOSPITAL_COMMUNITY): Payer: Self-pay

## 2022-07-11 NOTE — Telephone Encounter (Signed)
Called Pt to see if he was interested in participating in our Cardiac Rehab program. Pt expressed he wants to participate. Explained the review process and scheduling process to him and he verbalized he understood and looks forward to hearing from [place].

## 2022-07-14 ENCOUNTER — Ambulatory Visit (INDEPENDENT_AMBULATORY_CARE_PROVIDER_SITE_OTHER): Payer: Medicare PPO

## 2022-07-14 DIAGNOSIS — I4819 Other persistent atrial fibrillation: Secondary | ICD-10-CM

## 2022-07-15 ENCOUNTER — Other Ambulatory Visit: Payer: Self-pay | Admitting: Cardiovascular Disease

## 2022-07-18 ENCOUNTER — Telehealth (HOSPITAL_COMMUNITY): Payer: Self-pay

## 2022-07-18 NOTE — Telephone Encounter (Signed)
Reviewed with patient the Cardiac Rehab Cardiac Risk Prolife Nursing Assessment. Patient knows office location, and to wear comfortable clothing/closed-toed shoes. Recommended to patient to eat, take medications, and if diabetic, to check blood sugar before coming to classes. Explained orientation may take 2 hours.  

## 2022-07-22 ENCOUNTER — Telehealth: Payer: Self-pay | Admitting: Student

## 2022-07-22 ENCOUNTER — Encounter (HOSPITAL_COMMUNITY)
Admission: RE | Admit: 2022-07-22 | Discharge: 2022-07-22 | Disposition: A | Discharge status: Home / Self Care | Payer: Medicare PPO | Source: Ambulatory Visit | Attending: Cardiovascular Disease | Admitting: Cardiovascular Disease

## 2022-07-22 ENCOUNTER — Encounter (HOSPITAL_COMMUNITY): Payer: Self-pay

## 2022-07-22 ENCOUNTER — Ambulatory Visit: Payer: Medicare PPO | Attending: Student

## 2022-07-22 VITALS — BP 108/60 | HR 52 | Ht 71.5 in | Wt 206.8 lb

## 2022-07-22 DIAGNOSIS — I214 Non-ST elevation (NSTEMI) myocardial infarction: Secondary | ICD-10-CM | POA: Insufficient documentation

## 2022-07-22 DIAGNOSIS — R001 Bradycardia, unspecified: Secondary | ICD-10-CM | POA: Diagnosis not present

## 2022-07-22 DIAGNOSIS — I480 Paroxysmal atrial fibrillation: Secondary | ICD-10-CM

## 2022-07-22 DIAGNOSIS — Z955 Presence of coronary angioplasty implant and graft: Secondary | ICD-10-CM | POA: Insufficient documentation

## 2022-07-22 HISTORY — DX: Atherosclerotic heart disease of native coronary artery without angina pectoris: I25.10

## 2022-07-22 NOTE — Progress Notes (Signed)
Cardiac Rehab Medication Review   Does the patient  feel that his/her medications are working for him/her?  yes  Has the patient been experiencing any side effects to the medications prescribed?  yes  Does the patient measure his/her own blood pressure or blood glucose at home?  yes   Does the patient have any problems obtaining medications due to transportation or finances?   no  Understanding of regimen: excellent Understanding of indications: excellent Potential of compliance: excellent    Comments: Reviewed blood pressure medications with patient and purpose of Plavix. Pt checks blood pressure at home daily. Has not done so in the last week because it broke, but patient has ordered a new one and will resume tho week. Pt does report that he feels lightheaded sometimes, maybe form the BP medications.Lorin Picket 07/22/2022 9:25 AM

## 2022-07-22 NOTE — Telephone Encounter (Signed)
Called patient and shared D. Wittenborn's advice that he refrain daily walks and stop cardiac rehab until heart monitor time is completed. Patient verbalizes understanding.

## 2022-07-22 NOTE — Telephone Encounter (Signed)
Patient at cardiac rehab for intake evaluation. He was found to be bradycardic in the 40s with no symptoms. Heart rate increased with position changes. Strips reviewed with possible junctional rhythm. Post walk test strips showed pause. Patient reported very mild dizziness at end of walk test. EKG showed HR of 38 bpm with pause. Reviewed with DOD, Dr. Shari Prows. Plan to order live 3 day Zio and have patient follow up in office. Patient is instructed to stop carvedilol. He will not attend cardiac rehab or take his daily walks until after office evaluation.   Triage team, please order 3 day live Zio under Dr. Eden Emms and set patient up for office visit after the monitor.   Thank you so much!  Etta Grandchild. Francys Bolin, DNP, NP-C  07/22/2022, 11:31 AM [facility] [address] Office [phone] Fax [phone]

## 2022-07-22 NOTE — Progress Notes (Unsigned)
Enrolled patient for a 3 day Zio XT monitor to be mailed to patients home  Francisco Larson to read

## 2022-07-22 NOTE — Telephone Encounter (Signed)
Called to set up Zio and f/u appt, no answer. Left detailed message per DPR asking patient to call our office to set up Zio and follow up appt.

## 2022-07-22 NOTE — Addendum Note (Signed)
Addended by: Luellen Pucker on: 07/22/2022 01:44 PM   Modules accepted: Orders

## 2022-07-22 NOTE — Progress Notes (Signed)
Cardiac Individual Treatment Plan  Patient Details  Name: Francisco Larson MRN: [id number] Date of Birth: [birth year] Referring Provider:   Flowsheet Row INTENSIVE CARDIAC REHAB ORIENT from 07/22/2022 in [facility]  Referring Provider Charlton Haws, MD       Initial Encounter Date:  Flowsheet Row INTENSIVE CARDIAC REHAB ORIENT from 07/22/2022 in [facility] for Heart, Vascular, & Lung Health  Date 07/22/22       Visit Diagnosis: 06/26/22 NSTEMI (non-ST elevated myocardial infarction) (HCC)  06/27/22 Status post coronary artery stent placement  Junctional bradycardia - Plan: EKG 12-Lead, EKG 12-Lead  Patient's Home Medications on Admission:  Current Outpatient Medications:    amLODipine (NORVASC) 5 MG tablet, Take 1 tablet (5 mg total) by mouth daily., Disp: 30 tablet, Rfl: 3   aspirin EC 81 MG tablet, Take 1 tablet (81 mg total) by mouth daily. Swallow whole., Disp: 30 tablet, Rfl: 0   atorvastatin (LIPITOR) 80 MG tablet, Take 1 tablet (80 mg total) by mouth daily., Disp: 30 tablet, Rfl: 2   clopidogrel (PLAVIX) 75 MG tablet, TAKE 1 TABLET BY MOUTH EVERY DAY, Disp: 90 tablet, Rfl: 3   Multiple Vitamins-Minerals (CENTRUM SILVER PO), Take 1 tablet by mouth daily with breakfast., Disp: , Rfl:    nitroGLYCERIN (NITROSTAT) 0.4 MG SL tablet, Place 1 tablet (0.4 mg total) under the tongue every 5 (five) minutes as needed for chest pain., Disp: 25 tablet, Rfl: 3   TYLENOL 325 MG tablet, Take 325-650 mg by mouth every 6 (six) hours as needed for mild pain or headache., Disp: , Rfl:    carvedilol (COREG) 3.125 MG tablet, Take 1 tablet (3.125 mg total) by mouth 2 (two) times daily with a meal. (Patient not taking: Reported on 07/22/2022), Disp: 60 tablet, Rfl: 2  Past Medical History: Past Medical History:  Diagnosis Date   Arthritis    Coronary artery disease    Hyperlipidemia    Leukemia, chronic  lymphocytic (HCC)    SCCA (squamous cell carcinoma) of skin 08/08/2020   Mid Frontal Scalp (in situ) (curet and 5FU)   SCCA (squamous cell carcinoma) of skin 08/08/2020   Mid Parietal Scalp (in situ) (curet and 5FU)   SCCA (squamous cell carcinoma) of skin 08/08/2020   Mid Supratip of Nose (in situ)   Shingles    Squamous cell carcinoma of skin 12/08/2018   right jaw line cis   Vitamin D deficiency     Tobacco Use: Social History   Tobacco Use  Smoking Status Never  Smokeless Tobacco Never    Labs: Review Flowsheet  More data exists      Latest Ref Rng & Units 04/01/2021 12/02/2021 04/09/2022 06/26/2022 06/27/2022  Labs for ITP Cardiac and Pulmonary Rehab  Cholestrol 0 - 200 mg/dL 409  811  - - 914   LDL (calc) 0 - 99 mg/dL 76  88  - - 76   HDL-C >40 mg/dL 78.29  32  - - 32   Trlycerides <150 mg/dL 562.1  308  - - 71   Hemoglobin A1c 4.8 - 5.6 % 6.1  - 6.1  5.7  -    Capillary Blood Glucose: No results found for: "GLUCAP"   Exercise Target Goals: Exercise Program Goal: Individual exercise prescription set using results from initial 6 min walk test and THRR while considering  patient's activity barriers and safety.   Exercise Prescription Goal: Initial exercise prescription builds to 30-45  minutes a day of aerobic activity, 2-3 days per week.  Home exercise guidelines will be given to patient during program as part of exercise prescription that the participant will acknowledge.  Activity Barriers & Risk Stratification:  Activity Barriers & Cardiac Risk Stratification - 07/22/22 1027       Activity Barriers & Cardiac Risk Stratification   Activity Barriers Arthritis;Joint Problems;History of Falls;Deconditioning;Balance Concerns    Cardiac Risk Stratification High             6 Minute Walk:  6 Minute Walk     Row Name 07/22/22 1155         6 Minute Walk   Phase Initial     Distance 1353 feet     Walk Time 6 minutes     # of Rest Breaks 0     MPH 2.6      METS 1.9     RPE 9     Perceived Dyspnea  0     VO2 Peak 6.6     Symptoms Yes (comment)     Comments Mild lightheadedness on last lap     Resting HR 52 bpm     Resting BP 108/60     Resting Oxygen Saturation  98 %     Exercise Oxygen Saturation  during 6 min walk 98 %     Max Ex. HR 64 bpm     Max Ex. BP 130/64     2 Minute Post BP 112/70              Oxygen Initial Assessment:   Oxygen Re-Evaluation:   Oxygen Discharge (Final Oxygen Re-Evaluation):   Initial Exercise Prescription:  Initial Exercise Prescription - 07/22/22 1100       Date of Initial Exercise RX and Referring Provider   Date 07/22/22    Referring Provider Charlton Haws, MD    Expected Discharge Date 10/15/22      Recumbant Bike   Level 1    RPM 60    Watts 20    Minutes 15    METs 1.9      NuStep   Level 1    SPM 75    Minutes 15    METs 1.9      Prescription Details   Frequency (times per week) 3    Duration Progress to 30 minutes of continuous aerobic without signs/symptoms of physical distress      Intensity   THRR 40-80% of Max Heartrate 54-109    Ratings of Perceived Exertion 11-13    Perceived Dyspnea 0-4      Progression   Progression Continue progressive overload as per policy without signs/symptoms or physical distress.      Resistance Training   Training Prescription Yes    Weight 3 lbs    Reps 10-15             Perform Capillary Blood Glucose checks as needed.  Exercise Prescription Changes:   Exercise Comments:   Exercise Goals and Review:   Exercise Goals     Row Name 07/22/22 0921             Exercise Goals   Increase Physical Activity Yes       Intervention Provide advice, education, support and counseling about physical activity/exercise needs.;Develop an individualized exercise prescription for aerobic and resistive training based on initial evaluation findings, risk stratification, comorbidities and participant's personal goals.        Expected Outcomes Short Term:  Attend rehab on a regular basis to increase amount of physical activity.;Long Term: Exercising regularly at least 3-5 days a week.;Long Term: Add in home exercise to make exercise part of routine and to increase amount of physical activity.       Increase Strength and Stamina Yes       Intervention Provide advice, education, support and counseling about physical activity/exercise needs.;Develop an individualized exercise prescription for aerobic and resistive training based on initial evaluation findings, risk stratification, comorbidities and participant's personal goals.       Expected Outcomes Short Term: Increase workloads from initial exercise prescription for resistance, speed, and METs.;Short Term: Perform resistance training exercises routinely during rehab and add in resistance training at home;Long Term: Improve cardiorespiratory fitness, muscular endurance and strength as measured by increased METs and functional capacity ( )       Able to understand and use rate of perceived exertion (RPE) scale Yes       Intervention Provide education and explanation on how to use RPE scale       Expected Outcomes Short Term: Able to use RPE daily in rehab to express subjective intensity level;Long Term:  Able to use RPE to guide intensity level when exercising independently       Knowledge and understanding of Target Heart Rate Range (THRR) Yes       Intervention Provide education and explanation of THRR including how the numbers were predicted and where they are located for reference       Expected Outcomes Short Term: Able to state/look up THRR;Long Term: Able to use THRR to govern intensity when exercising independently;Short Term: Able to use daily as guideline for intensity in rehab       Understanding of Exercise Prescription Yes       Intervention Provide education, explanation, and written materials on patient's individual exercise prescription       Expected  Outcomes Short Term: Able to explain program exercise prescription;Long Term: Able to explain home exercise prescription to exercise independently                Exercise Goals Re-Evaluation :   Discharge Exercise Prescription (Final Exercise Prescription Changes):   Nutrition:  Target Goals: Understanding of nutrition guidelines, daily intake of sodium 1500mg , cholesterol 200mg , calories 30% from fat and 7% or less from saturated fats, daily to have 5 or more servings of fruits and vegetables.  Biometrics:  Pre Biometrics - 07/22/22 1025       Pre Biometrics   Waist Circumference 42 inches    Hip Circumference 42.5 inches    Waist to Hip Ratio 0.99 %    Triceps Skinfold 13 mm    % Body Fat 28.4 %    Grip Strength 31 kg    Flexibility 0 in   Could not reach   Single Leg Stand 3 seconds              Nutrition Therapy Plan and Nutrition Goals:   Nutrition Assessments:  MEDIFICTS Score Key: ?70 Need to make dietary changes  40-70 Heart Healthy Diet ? 40 Therapeutic Level Cholesterol Diet    Picture Your Plate Scores: <16 Unhealthy dietary pattern with much room for improvement. 41-50 Dietary pattern unlikely to meet recommendations for good health and room for improvement. 51-60 More healthful dietary pattern, with some room for improvement.  >60 Healthy dietary pattern, although there may be some specific behaviors that could be improved.    Nutrition Goals Re-Evaluation:   Nutrition  Goals Re-Evaluation:   Nutrition Goals Discharge (Final Nutrition Goals Re-Evaluation):   Psychosocial: Target Goals: Acknowledge presence or absence of significant depression and/or stress, maximize coping skills, provide positive support system. Participant is able to verbalize types and ability to use techniques and skills needed for reducing stress and depression.  Initial Review & Psychosocial Screening:  Initial Psych Review & Screening - 07/22/22 1027        Initial Review   Current issues with None Identified      Family Dynamics   Good Support System? --   [name] has his wife for support     Barriers   Psychosocial barriers to participate in program There are no identifiable barriers or psychosocial needs.      Screening Interventions   Interventions Encouraged to exercise             Quality of Life Scores:  Quality of Life - 07/22/22 1212       Quality of Life   Select Quality of Life      Quality of Life Scores   Health/Function Pre 26.04 %    Socioeconomic Pre 28.44 %    Psych/Spiritual Pre 28.79 %    Family Pre 30 %    GLOBAL Pre 27.75 %            Scores of 19 and below usually indicate a poorer quality of life in these areas.  A difference of  2-3 points is a clinically meaningful difference.  A difference of 2-3 points in the total score of the Quality of Life Index has been associated with significant improvement in overall quality of life, self-image, physical symptoms, and general health in studies assessing change in quality of life.  PHQ-9: Review Flowsheet  More data exists      07/22/2022 07/22/2021 04/01/2021 03/28/2020 03/28/2019  Depression screen PHQ 2/9  Decreased Interest 0 0 0 0 0  Down, Depressed, Hopeless 0 0 0 0 0  PHQ - 2 Score 0 0 0 0 0  Altered sleeping 0 - - - -  Tired, decreased energy 1 - - - -  Change in appetite 0 - - - -  Feeling bad or failure about yourself  0 - - - -  Trouble concentrating 0 - - - -  Moving slowly or fidgety/restless 0 - - - -  Suicidal thoughts 0 - - - -  PHQ-9 Score 1 - - - -  Difficult doing work/chores Not difficult at all - - - -   Interpretation of Total Score  Total Score Depression Severity:  1-4 = Minimal depression, 5-9 = Mild depression, 10-14 = Moderate depression, 15-19 = Moderately severe depression, 20-27 = Severe depression   Psychosocial Evaluation and Intervention:   Psychosocial Re-Evaluation:   Psychosocial Discharge (Final Psychosocial  Re-Evaluation):   Vocational Rehabilitation: Provide vocational rehab assistance to qualifying candidates.   Vocational Rehab Evaluation & Intervention:  Vocational Rehab - 07/22/22 1028       Initial Vocational Rehab Evaluation & Intervention   Assessment shows need for Vocational Rehabilitation No   Pt is retired, no vocational needs            Education: Education Goals: Education classes will be provided on a weekly basis, covering required topics. Participant will state understanding/return demonstration of topics presented.     Core Videos: Exercise    Move It!  Clinical staff conducted group or individual video education with verbal and written material and guidebook.  Patient  learns the recommended Pritikin exercise program. Exercise with the goal of living a long, healthy life. Some of the health benefits of exercise include controlled diabetes, healthier blood pressure levels, improved cholesterol levels, improved heart and lung capacity, improved sleep, and better body composition. Everyone should speak with their doctor before starting or changing an exercise routine.  Biomechanical Limitations Clinical staff conducted group or individual video education with verbal and written material and guidebook.  Patient learns how biomechanical limitations can impact exercise and how we can mitigate and possibly overcome limitations to have an impactful and balanced exercise routine.  Body Composition Clinical staff conducted group or individual video education with verbal and written material and guidebook.  Patient learns that body composition (ratio of muscle mass to fat mass) is a key component to assessing overall fitness, rather than body weight alone. Increased fat mass, especially visceral belly fat, can put [place] at increased risk for metabolic syndrome, type 2 diabetes, heart disease, and even death. It is recommended to combine diet and exercise (cardiovascular and  resistance training) to improve your body composition. Seek guidance from your physician and exercise physiologist before implementing an exercise routine.  Exercise Action Plan Clinical staff conducted group or individual video education with verbal and written material and guidebook.  Patient learns the recommended strategies to achieve and enjoy long-term exercise adherence, including variety, self-motivation, self-efficacy, and positive decision making. Benefits of exercise include fitness, good health, weight management, more energy, better sleep, less stress, and overall well-being.  Medical   Heart Disease Risk Reduction Clinical staff conducted group or individual video education with verbal and written material and guidebook.  Patient learns our heart is our most vital organ as it circulates oxygen, nutrients, white blood cells, and hormones throughout the entire body, and carries waste away. Data supports a plant-based eating plan like the Pritikin Program for its effectiveness in slowing progression of and reversing heart disease. The video provides a number of recommendations to address heart disease.   Metabolic Syndrome and Belly Fat  Clinical staff conducted group or individual video education with verbal and written material and guidebook.  Patient learns what metabolic syndrome is, how it leads to heart disease, and how one can reverse it and keep it from coming back. You have metabolic syndrome if you have 3 of the following 5 criteria: abdominal obesity, high blood pressure, high triglycerides, low HDL cholesterol, and high blood sugar.  Hypertension and Heart Disease Clinical staff conducted group or individual video education with verbal and written material and guidebook.  Patient learns that high blood pressure, or hypertension, is very common in the [place]. Hypertension is largely due to excessive salt intake, but other important risk factors include being overweight,  physical inactivity, drinking too much alcohol, smoking, and not eating enough potassium from fruits and vegetables. High blood pressure is a leading risk factor for heart attack, stroke, congestive heart failure, dementia, kidney failure, and premature death. Long-term effects of excessive salt intake include stiffening of the arteries and thickening of heart muscle and organ damage. Recommendations include ways to reduce hypertension and the risk of heart disease.  Diseases of Our Time - Focusing on Diabetes Clinical staff conducted group or individual video education with verbal and written material and guidebook.  Patient learns why the best way to stop diseases of our time is prevention, through food and other lifestyle changes. Medicine (such as prescription pills and surgeries) is often only a Band-Aid on the problem, not a long-term  solution. Most common diseases of our time include obesity, type 2 diabetes, hypertension, heart disease, and cancer. The Pritikin Program is recommended and has been proven to help reduce, reverse, and/or prevent the damaging effects of metabolic syndrome.  Nutrition   Overview of the Pritikin Eating Plan  Clinical staff conducted group or individual video education with verbal and written material and guidebook.  Patient learns about the Pritikin Eating Plan for disease risk reduction. The Pritikin Eating Plan emphasizes a wide variety of unrefined, minimally-processed carbohydrates, like fruits, vegetables, whole grains, and legumes. Go, Caution, and Stop food choices are explained. Plant-based and lean animal proteins are emphasized. Rationale provided for low sodium intake for blood pressure control, low added sugars for blood sugar stabilization, and low added fats and oils for coronary artery disease risk reduction and weight management.  Calorie Density  Clinical staff conducted group or individual video education with verbal and written material and  guidebook.  Patient learns about calorie density and how it impacts the Pritikin Eating Plan. Knowing the characteristics of the food you choose will help you decide whether those foods will lead to weight gain or weight loss, and whether you want to consume more or less of them. Weight loss is usually a side effect of the Pritikin Eating Plan because of its focus on low calorie-dense foods.  Label Reading  Clinical staff conducted group or individual video education with verbal and written material and guidebook.  Patient learns about the Pritikin recommended label reading guidelines and corresponding recommendations regarding calorie density, added sugars, sodium content, and whole grains.  Dining Out - Part 1  Clinical staff conducted group or individual video education with verbal and written material and guidebook.  Patient learns that restaurant meals can be sabotaging because they can be so high in calories, fat, sodium, and/or sugar. Patient learns recommended strategies on how to positively address this and avoid unhealthy pitfalls.  Facts on Fats  Clinical staff conducted group or individual video education with verbal and written material and guidebook.  Patient learns that lifestyle modifications can be just as effective, if not more so, as many medications for lowering your risk of heart disease. A Pritikin lifestyle can help to reduce your risk of inflammation and atherosclerosis (cholesterol build-up, or plaque, in the artery walls). Lifestyle interventions such as dietary choices and physical activity address the cause of atherosclerosis. A review of the types of fats and their impact on blood cholesterol levels, along with dietary recommendations to reduce fat intake is also included.  Nutrition Action Plan  Clinical staff conducted group or individual video education with verbal and written material and guidebook.  Patient learns how to incorporate Pritikin recommendations into  their lifestyle. Recommendations include planning and keeping personal health goals in mind as an important part of their success.  Healthy Mind-Set    Healthy Minds, Bodies, Hearts  Clinical staff conducted group or individual video education with verbal and written material and guidebook.  Patient learns how to identify when they are stressed. Video will discuss the impact of that stress, as well as the many benefits of stress management. Patient will also be introduced to stress management techniques. The way we think, act, and feel has an impact on our hearts.  How Our Thoughts Can Heal Our Hearts  Clinical staff conducted group or individual video education with verbal and written material and guidebook.  Patient learns that negative thoughts can cause depression and anxiety. This can result in negative lifestyle behavior  and serious health problems. Cognitive behavioral therapy is an effective method to help control our thoughts in order to change and improve our emotional outlook.  Additional Videos:  Exercise    Improving Performance  Clinical staff conducted group or individual video education with verbal and written material and guidebook.  Patient learns to use a non-linear approach by alternating intensity levels and lengths of time spent exercising to help burn more calories and lose more body fat. Cardiovascular exercise helps improve heart health, metabolism, hormonal balance, blood sugar control, and recovery from fatigue. Resistance training improves strength, endurance, balance, coordination, reaction time, metabolism, and muscle mass. Flexibility exercise improves circulation, posture, and balance. Seek guidance from your physician and exercise physiologist before implementing an exercise routine and learn your capabilities and proper form for all exercise.  Introduction to Yoga  Clinical staff conducted group or individual video education with verbal and written material and  guidebook.  Patient learns about yoga, a discipline of the coming together of mind, breath, and body. The benefits of yoga include improved flexibility, improved range of motion, better posture and core strength, increased lung function, weight loss, and positive self-image. Yoga's heart health benefits include lowered blood pressure, healthier heart rate, decreased cholesterol and triglyceride levels, improved immune function, and reduced stress. Seek guidance from your physician and exercise physiologist before implementing an exercise routine and learn your capabilities and proper form for all exercise.  Medical   Aging: Enhancing Your Quality of Life  Clinical staff conducted group or individual video education with verbal and written material and guidebook.  Patient learns key strategies and recommendations to stay in good physical health and enhance quality of life, such as prevention strategies, having an advocate, securing a Health Care Proxy and Power of Attorney, and keeping a list of medications and system for tracking them. It also discusses how to avoid risk for bone loss.  Biology of Weight Control  Clinical staff conducted group or individual video education with verbal and written material and guidebook.  Patient learns that weight gain occurs because we consume more calories than we burn (eating more, moving less). Even if your body weight is normal, you may have higher ratios of fat compared to muscle mass. Too much body fat puts you at increased risk for cardiovascular disease, heart attack, stroke, type 2 diabetes, and obesity-related cancers. In addition to exercise, following the Pritikin Eating Plan can help reduce your risk.  Decoding Lab Results  Clinical staff conducted group or individual video education with verbal and written material and guidebook.  Patient learns that lab test reflects one measurement whose values change over time and are influenced by many factors,  including medication, stress, sleep, exercise, food, hydration, pre-existing medical conditions, and more. It is recommended to use the knowledge from this video to become more involved with your lab results and evaluate your numbers to speak with your doctor.   Diseases of Our Time - Overview  Clinical staff conducted group or individual video education with verbal and written material and guidebook.  Patient learns that according to the CDC, 50% to 70% of chronic diseases (such as obesity, type 2 diabetes, elevated lipids, hypertension, and heart disease) are avoidable through lifestyle improvements including healthier food choices, listening to satiety cues, and increased physical activity.  Sleep Disorders Clinical staff conducted group or individual video education with verbal and written material and guidebook.  Patient learns how good quality and duration of sleep are important to overall health and well-being.  Patient also learns about sleep disorders and how they impact health along with recommendations to address them, including discussing with a physician.  Nutrition  Dining Out - Part 2 Clinical staff conducted group or individual video education with verbal and written material and guidebook.  Patient learns how to plan ahead and communicate in order to maximize their dining experience in a healthy and nutritious manner. Included are recommended food choices based on the type of restaurant the patient is visiting.   Fueling a Banker conducted group or individual video education with verbal and written material and guidebook.  There is a strong connection between our food choices and our health. Diseases like obesity and type 2 diabetes are very prevalent and are in large-part due to lifestyle choices. The Pritikin Eating Plan provides plenty of food and hunger-curbing satisfaction. It is easy to follow, affordable, and helps reduce health risks.  Menu Workshop   Clinical staff conducted group or individual video education with verbal and written material and guidebook.  Patient learns that restaurant meals can sabotage health goals because they are often packed with calories, fat, sodium, and sugar. Recommendations include strategies to plan ahead and to communicate with the manager, chef, or server to help order a healthier meal.  Planning Your Eating Strategy  Clinical staff conducted group or individual video education with verbal and written material and guidebook.  Patient learns about the Pritikin Eating Plan and its benefit of reducing the risk of disease. The Pritikin Eating Plan does not focus on calories. Instead, it emphasizes high-quality, nutrient-rich foods. By knowing the characteristics of the foods, we choose, we can determine their calorie density and make informed decisions.  Targeting Your Nutrition Priorities  Clinical staff conducted group or individual video education with verbal and written material and guidebook.  Patient learns that lifestyle habits have a tremendous impact on disease risk and progression. This video provides eating and physical activity recommendations based on your personal health goals, such as reducing LDL cholesterol, losing weight, preventing or controlling type 2 diabetes, and reducing high blood pressure.  Vitamins and Minerals  Clinical staff conducted group or individual video education with verbal and written material and guidebook.  Patient learns different ways to obtain key vitamins and minerals, including through a recommended healthy diet. It is important to discuss all supplements you take with your doctor.   Healthy Mind-Set    Smoking Cessation  Clinical staff conducted group or individual video education with verbal and written material and guidebook.  Patient learns that cigarette smoking and tobacco addiction pose a serious health risk which affects millions of people. Stopping smoking  will significantly reduce the risk of heart disease, lung disease, and many forms of cancer. Recommended strategies for quitting are covered, including working with your doctor to develop a successful plan.  Culinary   Becoming a Set designer conducted group or individual video education with verbal and written material and guidebook.  Patient learns that cooking at home can be healthy, cost-effective, quick, and puts them in control. Keys to cooking healthy recipes will include looking at your recipe, assessing your equipment needs, planning ahead, making it simple, choosing cost-effective seasonal ingredients, and limiting the use of added fats, salts, and sugars.  Cooking - Breakfast and Snacks  Clinical staff conducted group or individual video education with verbal and written material and guidebook.  Patient learns how important breakfast is to satiety and nutrition through the entire day. Recommendations include  key foods to eat during breakfast to help stabilize blood sugar levels and to prevent overeating at meals later in the day. Planning ahead is also a key component.  Cooking - Educational psychologist conducted group or individual video education with verbal and written material and guidebook.  Patient learns eating strategies to improve overall health, including an approach to cook more at home. Recommendations include thinking of animal protein as a side on your plate rather than center stage and focusing instead on lower calorie dense options like vegetables, fruits, whole grains, and plant-based proteins, such as beans. Making sauces in large quantities to freeze for later and leaving the skin on your vegetables are also recommended to maximize your experience.  Cooking - Healthy Salads and Dressing Clinical staff conducted group or individual video education with verbal and written material and guidebook.  Patient learns that vegetables, fruits, whole  grains, and legumes are the foundations of the Pritikin Eating Plan. Recommendations include how to incorporate each of these in flavorful and healthy salads, and how to create homemade salad dressings. Proper handling of ingredients is also covered. Cooking - Soups and [employer] - Soups and Desserts Clinical staff conducted group or individual video education with verbal and written material and guidebook.  Patient learns that Pritikin soups and desserts make for easy, nutritious, and delicious snacks and meal components that are low in sodium, fat, sugar, and calorie density, while high in vitamins, minerals, and filling fiber. Recommendations include simple and healthy ideas for soups and desserts.   Overview     The Pritikin Solution Program Overview Clinical staff conducted group or individual video education with verbal and written material and guidebook.  Patient learns that the results of the Pritikin Program have been documented in more than 100 articles published in peer-reviewed journals, and the benefits include reducing risk factors for (and, in some cases, even reversing) high cholesterol, high blood pressure, type 2 diabetes, obesity, and more! An overview of the three key pillars of the Pritikin Program will be covered: eating well, doing regular exercise, and having a healthy mind-set.  WORKSHOPS  Exercise: Exercise Basics: Building Your Action Plan Clinical staff led group instruction and group discussion with PowerPoint presentation and patient guidebook. To enhance the learning environment the use of posters, models and videos may be added. At the conclusion of this workshop, patients will comprehend the difference between physical activity and exercise, as well as the benefits of incorporating both, into their routine. Patients will understand the FITT (Frequency, Intensity, Time, and Type) principle and how to use it to build an exercise action plan. In addition,  safety concerns and other considerations for exercise and cardiac rehab will be addressed by the presenter. The purpose of this lesson is to promote a comprehensive and effective weekly exercise routine in order to improve patients' overall level of fitness.   Managing Heart Disease: Your Path to a Healthier Heart Clinical staff led group instruction and group discussion with PowerPoint presentation and patient guidebook. To enhance the learning environment the use of posters, models and videos may be added.At the conclusion of this workshop, patients will understand the anatomy and physiology of the heart. Additionally, they will understand how Pritikin's three pillars impact the risk factors, the progression, and the management of heart disease.  The purpose of this lesson is to provide a high-level overview of the heart, heart disease, and how the Pritikin lifestyle positively impacts risk factors.  Exercise Biomechanics Clinical  staff led group instruction and group discussion with PowerPoint presentation and patient guidebook. To enhance the learning environment the use of posters, models and videos may be added. Patients will learn how the structural parts of their bodies function and how these functions impact their daily activities, movement, and exercise. Patients will learn how to promote a neutral spine, learn how to manage pain, and identify ways to improve their physical movement in order to promote healthy living. The purpose of this lesson is to expose patients to common physical limitations that impact physical activity. Participants will learn practical ways to adapt and manage aches and pains, and to minimize their effect on regular exercise. Patients will learn how to maintain good posture while sitting, walking, and lifting.  Balance Training and Fall Prevention  Clinical staff led group instruction and group discussion with PowerPoint presentation and patient guidebook. To  enhance the learning environment the use of posters, models and videos may be added. At the conclusion of this workshop, patients will understand the importance of their sensorimotor skills (vision, proprioception, and the vestibular system) in maintaining their ability to balance as they age. Patients will apply a variety of balancing exercises that are appropriate for their current level of function. Patients will understand the common causes for poor balance, possible solutions to these problems, and ways to modify their physical environment in order to minimize their fall risk. The purpose of this lesson is to teach patients about the importance of maintaining balance as they age and ways to minimize their risk of falling.  WORKSHOPS   Nutrition:  Fueling a Ship broker led group instruction and group discussion with PowerPoint presentation and patient guidebook. To enhance the learning environment the use of posters, models and videos may be added. Patients will review the foundational principles of the Pritikin Eating Plan and understand what constitutes a serving size in each of the food groups. Patients will also learn Pritikin-friendly foods that are better choices when away from home and review make-ahead meal and snack options. Calorie density will be reviewed and applied to three nutrition priorities: weight maintenance, weight loss, and weight gain. The purpose of this lesson is to reinforce (in a group setting) the key concepts around what patients are recommended to eat and how to apply these guidelines when away from home by planning and selecting Pritikin-friendly options. Patients will understand how calorie density may be adjusted for different weight management goals.  Mindful Eating  Clinical staff led group instruction and group discussion with PowerPoint presentation and patient guidebook. To enhance the learning environment the use of posters, models and videos may  be added. Patients will briefly review the concepts of the Pritikin Eating Plan and the importance of low-calorie dense foods. The concept of mindful eating will be introduced as well as the importance of paying attention to internal hunger signals. Triggers for non-hunger eating and techniques for dealing with triggers will be explored. The purpose of this lesson is to provide patients with the opportunity to review the basic principles of the Pritikin Eating Plan, discuss the value of eating mindfully and how to measure internal cues of hunger and fullness using the Hunger Scale. Patients will also discuss reasons for non-hunger eating and learn strategies to use for controlling emotional eating.  Targeting Your Nutrition Priorities Clinical staff led group instruction and group discussion with PowerPoint presentation and patient guidebook. To enhance the learning environment the use of posters, models and videos may be added. Patients will  learn how to determine their genetic susceptibility to disease by reviewing their family history. Patients will gain insight into the importance of diet as part of an overall healthy lifestyle in mitigating the impact of genetics and other environmental insults. The purpose of this lesson is to provide patients with the opportunity to assess their personal nutrition priorities by looking at their family history, their own health history and current risk factors. Patients will also be able to discuss ways of prioritizing and modifying the Pritikin Eating Plan for their highest risk areas  Menu  Clinical staff led group instruction and group discussion with PowerPoint presentation and patient guidebook. To enhance the learning environment the use of posters, models and videos may be added. Using menus brought in from [employer], or printed from [employer], patients will apply the Pritikin dining out guidelines that were presented in the [employer] video. Patients will also be able to practice these guidelines in a variety of provided scenarios. The purpose of this lesson is to provide patients with the opportunity to practice hands-on learning of the Pritikin Dining Out guidelines with actual menus and practice scenarios.  Label Reading Clinical staff led group instruction and group discussion with PowerPoint presentation and patient guidebook. To enhance the learning environment the use of posters, models and videos may be added. Patients will review and discuss the Pritikin label reading guidelines presented in Pritikin's Label Reading Educational series video. Using fool labels brought in from local grocery stores and markets, patients will apply the label reading guidelines and determine if the packaged food meet the Pritikin guidelines. The purpose of this lesson is to provide patients with the opportunity to review, discuss, and practice hands-on learning of the Pritikin Label Reading guidelines with actual packaged food labels. Cooking School  Pritikin's [employer] are designed to teach patients ways to prepare quick, simple, and affordable recipes at home. The importance of nutrition's role in chronic disease risk reduction is reflected in its emphasis in the overall Pritikin program. By learning how to prepare essential core Pritikin Eating Plan recipes, patients will increase control over what they eat; be able to customize the flavor of foods without the use of added salt, sugar, or fat; and improve the quality of the food they consume. By learning a set of core recipes which are easily assembled, quickly prepared, and affordable, patients are more likely to prepare more healthy foods at home. These workshops focus on convenient breakfasts, simple entres, side dishes, and desserts which can be prepared with minimal effort and are consistent with nutrition recommendations for cardiovascular risk reduction. Cooking  [employer] are taught by a Armed forces logistics/support/administrative officer (RD) who has been trained by the [employer]. The chef or RD has a clear understanding of the importance of minimizing - if not completely eliminating - added fat, sugar, and sodium in recipes. Throughout the series of Cooking School Workshop sessions, patients will learn about healthy ingredients and efficient methods of cooking to build confidence in their capability to prepare    Cooking School weekly topics:  Adding Flavor- Sodium-Free  Fast and Healthy Breakfasts  Powerhouse Plant-Based Proteins  Satisfying Salads and Dressings  Simple Sides and Sauces  International Cuisine-Spotlight on the [employer] Zones  Delicious Desserts  Savory Soups  [employer] - Meals in a Astronomer Appetizers and Snacks  Comforting Weekend Breakfasts  One-Pot Wonders   Fast Evening Meals  Landscape architect Your Pritikin  Plate  WORKSHOPS   Healthy Mindset (Psychosocial):  Focused Goals, Sustainable Changes Clinical staff led group instruction and group discussion with PowerPoint presentation and patient guidebook. To enhance the learning environment the use of posters, models and videos may be added. Patients will be able to apply effective goal setting strategies to establish at least one personal goal, and then take consistent, meaningful action toward that goal. They will learn to identify common barriers to achieving personal goals and develop strategies to overcome them. Patients will also gain an understanding of how our mind-set can impact our ability to achieve goals and the importance of cultivating a positive and growth-oriented mind-set. The purpose of this lesson is to provide patients with a deeper understanding of how to set and achieve personal goals, as well as the tools and strategies needed to overcome common obstacles which may arise along the way.  From Head to Heart: The Power of a Healthy  Outlook  Clinical staff led group instruction and group discussion with PowerPoint presentation and patient guidebook. To enhance the learning environment the use of posters, models and videos may be added. Patients will be able to recognize and describe the impact of emotions and mood on physical health. They will discover the importance of self-care and explore self-care practices which may work for them. Patients will also learn how to utilize the 4 C's to cultivate a healthier outlook and better manage stress and challenges. The purpose of this lesson is to demonstrate to patients how a healthy outlook is an essential part of maintaining good health, especially as they continue their cardiac rehab journey.  Healthy Sleep for a Healthy Heart Clinical staff led group instruction and group discussion with PowerPoint presentation and patient guidebook. To enhance the learning environment the use of posters, models and videos may be added. At the conclusion of this workshop, patients will be able to demonstrate knowledge of the importance of sleep to overall health, well-being, and quality of life. They will understand the symptoms of, and treatments for, common sleep disorders. Patients will also be able to identify daytime and nighttime behaviors which impact sleep, and they will be able to apply these tools to help manage sleep-related challenges. The purpose of this lesson is to provide patients with a general overview of sleep and outline the importance of quality sleep. Patients will learn about a few of the most common sleep disorders. Patients will also be introduced to the concept of "sleep hygiene," and discover ways to self-manage certain sleeping problems through simple daily behavior changes. Finally, the workshop will motivate patients by clarifying the links between quality sleep and their goals of heart-healthy living.   Recognizing and Reducing Stress Clinical staff led group instruction and  group discussion with PowerPoint presentation and patient guidebook. To enhance the learning environment the use of posters, models and videos may be added. At the conclusion of this workshop, patients will be able to understand the types of stress reactions, differentiate between acute and chronic stress, and recognize the impact that chronic stress has on their health. They will also be able to apply different coping mechanisms, such as reframing negative self-talk. Patients will have the opportunity to practice a variety of stress management techniques, such as deep abdominal breathing, progressive muscle relaxation, and/or guided imagery.  The purpose of this lesson is to educate patients on the role of stress in their lives and to provide healthy techniques for coping with it.  Learning Barriers/Preferences:  Learning Barriers/Preferences - 07/22/22  1028       Learning Barriers/Preferences   Learning Barriers Sight;Hearing   wears glasses, hearing aids R& L   Learning Preferences Group Instruction;Individual Instruction;Skilled Demonstration;Written Material;Computer/Internet             Education Topics:  Knowledge Questionnaire Score:  Knowledge Questionnaire Score - 07/22/22 1125       Knowledge Questionnaire Score   Pre Score 21/24             Core Components/Risk Factors/Patient Goals at Admission:  Personal Goals and Risk Factors at Admission - 07/22/22 1029       Core Components/Risk Factors/Patient Goals on Admission    Weight Management Yes;Weight Loss    Intervention Weight Management: Develop a combined nutrition and exercise program designed to reach desired caloric intake, while maintaining appropriate intake of nutrient and fiber, sodium and fats, and appropriate energy expenditure required for the weight goal.;Weight Management: Provide education and appropriate resources to help participant work on and attain dietary goals.;Weight Management/Obesity:  Establish reasonable short term and long term weight goals.    Admit Weight 206 lb 12.7 oz (93.8 kg)    Expected Outcomes Short Term: Continue to assess and modify interventions until short term weight is achieved;Long Term: Adherence to nutrition and physical activity/exercise program aimed toward attainment of established weight goal;Weight Loss: Understanding of general recommendations for a balanced deficit meal plan, which promotes 1-2 lb weight loss per week and includes a negative energy balance of [id number] kcal/d;Understanding recommendations for meals to include 15-35% energy as protein, 25-35% energy from fat, 35-60% energy from carbohydrates, less than 200mg  of dietary cholesterol, 20-35 gm of total fiber daily;Understanding of distribution of calorie intake throughout the day with the consumption of 4-5 meals/snacks    Hypertension Yes    Intervention Provide education on lifestyle modifcations including regular physical activity/exercise, weight management, moderate sodium restriction and increased consumption of fresh fruit, vegetables, and low fat dairy, alcohol moderation, and smoking cessation.;Monitor prescription use compliance.    Expected Outcomes Short Term: Continued assessment and intervention until BP is < 140/10mm HG in hypertensive participants. < 130/44mm HG in hypertensive participants with diabetes, heart failure or chronic kidney disease.;Long Term: Maintenance of blood pressure at goal levels.    Lipids Yes    Intervention Provide education and support for participant on nutrition & aerobic/resistive exercise along with prescribed medications to achieve LDL 70mg , HDL >40mg .    Expected Outcomes Short Term: Participant states understanding of desired cholesterol values and is compliant with medications prescribed. Participant is following exercise prescription and nutrition guidelines.;Long Term: Cholesterol controlled with medications as prescribed, with individualized  exercise RX and with personalized nutrition plan. Value goals: LDL < 70mg , HDL > 40 mg.             Core Components/Risk Factors/Patient Goals Review:    Core Components/Risk Factors/Patient Goals at Discharge (Final Review):    ITP Comments:  ITP Comments     Row Name 07/22/22 0914           ITP Comments Armanda Magic, MD:  Medical Director.  Intorduction to the [employer] / Intensive Cardiac Rehab.  Initial orientation packet reviewed with the patient.                Comments: Participant attended orientation for the cardiac rehabilitation program on  07/22/2022  to perform initial intake and exercise walk test. Patient introduced to the Pritikin Program education and orientation packet was reviewed. Completed 6-minute walk test,  [name] did report feeling a liitle lightheaded on the last lap of his 6 minute walk test. Onsite provider Carlos Levering NP onsite provider shown today's ECG tracings 12 lead ECG obtained please see previous documentation. measurements, initial ITP, and exercise prescription. Vital signs Blood pressure stable. Telemetry-sinus brady with a first degree heart block per 12 lead ECG, sinus pauses noted occasional PVC's. This has been previously documented. Patient knows to hold off starting exercise until he is cleared by Dr Fabio Bering office. Patient states understanding. [facility] is on hold.[name] RN BSN   Service time was from [phone] to 1200.

## 2022-07-22 NOTE — Telephone Encounter (Signed)
Called patient and discussed recommendations. Patient agrees with plan to wear 3 day zio. Order placed. Appt scehduled w/ aPP on 08/15/22. Patient asking if he can continue with cardiac rehab or if he must wait for results before resuming, forwarded to D. Wittenborn, NP.

## 2022-07-22 NOTE — Progress Notes (Signed)
Mr Bourquin is here for cardiac rehab orientation. Questionable junctional rhythm rate 48-50's. Blood pressure 108/60. Patient asymptomatic. P wave hard to visualize. Asymptomatic now but reports feeling a little lightheaded yesterday evening. Sitting blood pressure 102/68 heart rate 44. Standing blood pressure 112/56 heart rate 54. Onsite provider Carlos Levering NP reviewed ECG tracings said okay to proceed with 6 minute walk test. Has loop recorder. Will send today's ECG tracings to Dr Eden Emms to review.  Patient noted to have some nonsustained bradycardia resting heart noted in the upper 30's. Asymptomatic. 6 minute walk test completed. Carlos Levering NP reviewed additional ECG strips. Ordered 12 lead ECG. 12 lead ECG ordered and obtained. ECG showed Sinus Huston Foley first degree heart block. Deborah reviewed 12 lead. Patient instructed per Gavin Pound to hold Coreg  until further notice. Gavin Pound came and spoke with Mr Winker. Gavin Pound said that she will order a 3 day monitor for the patient to wear and will arrange an office visit to be seen and evaluated to review and discuss monitor results.  Deborah instructed to patient not to walk outside until after he has been seen in the office. Will hold exercise until cleared to begin. Patient states understanding and will call the office or report to the ED if he develops more lightheadedness, shortness of breath. Patient remained asymptomatic upon exit from cardiac rehab.[name] RN BSN

## 2022-07-24 DIAGNOSIS — I480 Paroxysmal atrial fibrillation: Secondary | ICD-10-CM

## 2022-07-27 ENCOUNTER — Other Ambulatory Visit: Payer: Self-pay | Admitting: Cardiology

## 2022-07-28 ENCOUNTER — Encounter (HOSPITAL_COMMUNITY): Payer: Medicare PPO

## 2022-07-30 ENCOUNTER — Encounter (HOSPITAL_COMMUNITY): Payer: Medicare PPO

## 2022-08-01 ENCOUNTER — Encounter (HOSPITAL_COMMUNITY): Payer: Medicare PPO

## 2022-08-04 ENCOUNTER — Encounter (HOSPITAL_COMMUNITY): Payer: Medicare PPO

## 2022-08-04 NOTE — Progress Notes (Signed)
Carelink Summary Report / Loop Recorder 

## 2022-08-05 DIAGNOSIS — I480 Paroxysmal atrial fibrillation: Secondary | ICD-10-CM | POA: Diagnosis not present

## 2022-08-06 ENCOUNTER — Encounter (HOSPITAL_COMMUNITY): Payer: Medicare PPO

## 2022-08-08 ENCOUNTER — Encounter (HOSPITAL_COMMUNITY): Payer: Medicare PPO

## 2022-08-08 ENCOUNTER — Encounter (HOSPITAL_COMMUNITY): Payer: Self-pay | Admitting: *Deleted

## 2022-08-08 DIAGNOSIS — I214 Non-ST elevation (NSTEMI) myocardial infarction: Secondary | ICD-10-CM

## 2022-08-08 DIAGNOSIS — Z955 Presence of coronary angioplasty implant and graft: Secondary | ICD-10-CM

## 2022-08-08 NOTE — Progress Notes (Signed)
Cardiac Individual Treatment Plan  Patient Details  Name: Francisco Larson MRN: [id number] Date of Birth: [birth year] Referring Provider:   Flowsheet Row INTENSIVE CARDIAC REHAB ORIENT from 07/22/2022 in [facility]  Referring Provider Charlton Haws, MD       Initial Encounter Date:  Flowsheet Row INTENSIVE CARDIAC REHAB ORIENT from 07/22/2022 in [facility]  Date 07/22/22       Visit Diagnosis: 06/26/22 NSTEMI (non-ST elevated myocardial infarction) (HCC)  06/27/22 Status post coronary artery stent placement  Patient's Home Medications on Admission:  Current Outpatient Medications:    amLODipine (NORVASC) 5 MG tablet, Take 1 tablet (5 mg total) by mouth daily., Disp: 30 tablet, Rfl: 3   aspirin EC (ASPIRIN LOW DOSE) 81 MG tablet, TAKE 1 TABLET (81 MG TOTAL) BY MOUTH DAILY. SWALLOW WHOLE., Disp: 30 tablet, Rfl: 10   atorvastatin (LIPITOR) 80 MG tablet, Take 1 tablet (80 mg total) by mouth daily., Disp: 30 tablet, Rfl: 2   carvedilol (COREG) 3.125 MG tablet, Take 1 tablet (3.125 mg total) by mouth 2 (two) times daily with a meal. (Patient not taking: Reported on 07/22/2022), Disp: 60 tablet, Rfl: 2   clopidogrel (PLAVIX) 75 MG tablet, TAKE 1 TABLET BY MOUTH EVERY DAY, Disp: 90 tablet, Rfl: 3   Multiple Vitamins-Minerals (CENTRUM SILVER PO), Take 1 tablet by mouth daily with breakfast., Disp: , Rfl:    nitroGLYCERIN (NITROSTAT) 0.4 MG SL tablet, Place 1 tablet (0.4 mg total) under the tongue every 5 (five) minutes as needed for chest pain., Disp: 25 tablet, Rfl: 3   TYLENOL 325 MG tablet, Take 325-650 mg by mouth every 6 (six) hours as needed for mild pain or headache., Disp: , Rfl:   Past Medical History: Past Medical History:  Diagnosis Date   Arthritis    Coronary artery disease    Hyperlipidemia    Leukemia, chronic lymphocytic (HCC)    SCCA (squamous cell  carcinoma) of skin 08/08/2020   Mid Frontal Scalp (in situ) (curet and 5FU)   SCCA (squamous cell carcinoma) of skin 08/08/2020   Mid Parietal Scalp (in situ) (curet and 5FU)   SCCA (squamous cell carcinoma) of skin 08/08/2020   Mid Supratip of Nose (in situ)   Shingles    Squamous cell carcinoma of skin 12/08/2018   right jaw line cis   Vitamin D deficiency     Tobacco Use: Social History   Tobacco Use  Smoking Status Never  Smokeless Tobacco Never    Labs: Review Flowsheet  More data exists      Latest Ref Rng & Units 04/01/2021 12/02/2021 04/09/2022 06/26/2022 06/27/2022  Labs for ITP Cardiac and Pulmonary Rehab  Cholestrol 0 - 200 mg/dL 409  811  - - 914   LDL (calc) 0 - 99 mg/dL 76  88  - - 76   HDL-C >40 mg/dL 78.29  32  - - 32   Trlycerides <150 mg/dL 562.1  308  - - 71   Hemoglobin A1c 4.8 - 5.6 % 6.1  - 6.1  5.7  -    Details            Capillary Blood Glucose: No results found for: "GLUCAP"   Exercise Target Goals: Exercise Program Goal: Individual exercise prescription set using results from initial 6 min walk test and THRR while considering  patient's activity barriers and safety.   Exercise Prescription Goal:  Initial exercise prescription builds to 30-45 minutes a day of aerobic activity, 2-3 days per week.  Home exercise guidelines will be given to patient during program as part of exercise prescription that the participant will acknowledge.  Activity Barriers & Risk Stratification:  Activity Barriers & Cardiac Risk Stratification - 07/22/22 1027       Activity Barriers & Cardiac Risk Stratification   Activity Barriers Arthritis;Joint Problems;History of Falls;Deconditioning;Balance Concerns    Cardiac Risk Stratification High             6 Minute Walk:  6 Minute Walk     Row Name 07/22/22 1155         6 Minute Walk   Phase Initial     Distance 1353 feet     Walk Time 6 minutes     # of Rest Breaks 0     MPH 2.6     METS 1.9      RPE 9     Perceived Dyspnea  0     VO2 Peak 6.6     Symptoms Yes (comment)     Comments Mild lightheadedness on last lap     Resting HR 52 bpm     Resting BP 108/60     Resting Oxygen Saturation  98 %     Exercise Oxygen Saturation  during 6 min walk 98 %     Max Ex. HR 64 bpm     Max Ex. BP 130/64     2 Minute Post BP 112/70              Oxygen Initial Assessment:   Oxygen Re-Evaluation:   Oxygen Discharge (Final Oxygen Re-Evaluation):   Initial Exercise Prescription:  Initial Exercise Prescription - 07/22/22 1100       Date of Initial Exercise RX and Referring Provider   Date 07/22/22    Referring Provider Charlton Haws, MD    Expected Discharge Date 10/15/22      Recumbant Bike   Level 1    RPM 60    Watts 20    Minutes 15    METs 1.9      NuStep   Level 1    SPM 75    Minutes 15    METs 1.9      Prescription Details   Frequency (times per week) 3    Duration Progress to 30 minutes of continuous aerobic without signs/symptoms of physical distress      Intensity   THRR 40-80% of Max Heartrate 54-109    Ratings of Perceived Exertion 11-13    Perceived Dyspnea 0-4      Progression   Progression Continue progressive overload as per policy without signs/symptoms or physical distress.      Resistance Training   Training Prescription Yes    Weight 3 lbs    Reps 10-15             Perform Capillary Blood Glucose checks as needed.  Exercise Prescription Changes:   Exercise Comments:   Exercise Goals and Review:   Exercise Goals     Row Name 07/22/22 0921             Exercise Goals   Increase Physical Activity Yes       Intervention Provide advice, education, support and counseling about physical activity/exercise needs.;Develop an individualized exercise prescription for aerobic and resistive training based on initial evaluation findings, risk stratification, comorbidities and participant's personal goals.  Expected Outcomes  Short Term: Attend rehab on a regular basis to increase amount of physical activity.;Long Term: Exercising regularly at least 3-5 days a week.;Long Term: Add in home exercise to make exercise part of routine and to increase amount of physical activity.       Increase Strength and Stamina Yes       Intervention Provide advice, education, support and counseling about physical activity/exercise needs.;Develop an individualized exercise prescription for aerobic and resistive training based on initial evaluation findings, risk stratification, comorbidities and participant's personal goals.       Expected Outcomes Short Term: Increase workloads from initial exercise prescription for resistance, speed, and METs.;Short Term: Perform resistance training exercises routinely during rehab and add in resistance training at home;Long Term: Improve cardiorespiratory fitness, muscular endurance and strength as measured by increased METs and functional capacity ( )       Able to understand and use rate of perceived exertion (RPE) scale Yes       Intervention Provide education and explanation on how to use RPE scale       Expected Outcomes Short Term: Able to use RPE daily in rehab to express subjective intensity level;Long Term:  Able to use RPE to guide intensity level when exercising independently       Knowledge and understanding of Target Heart Rate Range (THRR) Yes       Intervention Provide education and explanation of THRR including how the numbers were predicted and where they are located for reference       Expected Outcomes Short Term: Able to state/look up THRR;Long Term: Able to use THRR to govern intensity when exercising independently;Short Term: Able to use daily as guideline for intensity in rehab       Understanding of Exercise Prescription Yes       Intervention Provide education, explanation, and written materials on patient's individual exercise prescription       Expected Outcomes Short Term: Able  to explain program exercise prescription;Long Term: Able to explain home exercise prescription to exercise independently                Exercise Goals Re-Evaluation :   Discharge Exercise Prescription (Final Exercise Prescription Changes):   Nutrition:  Target Goals: Understanding of nutrition guidelines, daily intake of sodium 1500mg , cholesterol 200mg , calories 30% from fat and 7% or less from saturated fats, daily to have 5 or more servings of fruits and vegetables.  Biometrics:  Pre Biometrics - 07/22/22 1025       Pre Biometrics   Waist Circumference 42 inches    Hip Circumference 42.5 inches    Waist to Hip Ratio 0.99 %    Triceps Skinfold 13 mm    % Body Fat 28.4 %    Grip Strength 31 kg    Flexibility 0 in   Could not reach   Single Leg Stand 3 seconds              Nutrition Therapy Plan and Nutrition Goals:   Nutrition Assessments:  MEDIFICTS Score Key: ?70 Need to make dietary changes  40-70 Heart Healthy Diet ? 40 Therapeutic Level Cholesterol Diet    Picture Your Plate Scores: <19 Unhealthy dietary pattern with much room for improvement. 41-50 Dietary pattern unlikely to meet recommendations for good health and room for improvement. 51-60 More healthful dietary pattern, with some room for improvement.  >60 Healthy dietary pattern, although there may be some specific behaviors that could be improved.    Nutrition  Goals Re-Evaluation:   Nutrition Goals Re-Evaluation:   Nutrition Goals Discharge (Final Nutrition Goals Re-Evaluation):   Psychosocial: Target Goals: Acknowledge presence or absence of significant depression and/or stress, maximize coping skills, provide positive support system. Participant is able to verbalize types and ability to use techniques and skills needed for reducing stress and depression.  Initial Review & Psychosocial Screening:  Initial Psych Review & Screening - 07/22/22 1027       Initial Review   Current  issues with None Identified      Family Dynamics   Good Support System? --   [name] has his wife for support     Barriers   Psychosocial barriers to participate in program There are no identifiable barriers or psychosocial needs.      Screening Interventions   Interventions Encouraged to exercise             Quality of Life Scores:  Quality of Life - 07/22/22 1212       Quality of Life   Select Quality of Life      Quality of Life Scores   Health/Function Pre 26.04 %    Socioeconomic Pre 28.44 %    Psych/Spiritual Pre 28.79 %    Family Pre 30 %    GLOBAL Pre 27.75 %            Scores of 19 and below usually indicate a poorer quality of life in these areas.  A difference of  2-3 points is a clinically meaningful difference.  A difference of 2-3 points in the total score of the Quality of Life Index has been associated with significant improvement in overall quality of life, self-image, physical symptoms, and general health in studies assessing change in quality of life.  PHQ-9: Review Flowsheet  More data exists      07/22/2022 07/22/2021 04/01/2021 03/28/2020 03/28/2019  Depression screen PHQ 2/9  Decreased Interest 0 0 0 0 0  Down, Depressed, Hopeless 0 0 0 0 0  PHQ - 2 Score 0 0 0 0 0  Altered sleeping 0 - - - -  Tired, decreased energy 1 - - - -  Change in appetite 0 - - - -  Feeling bad or failure about yourself  0 - - - -  Trouble concentrating 0 - - - -  Moving slowly or fidgety/restless 0 - - - -  Suicidal thoughts 0 - - - -  PHQ-9 Score 1 - - - -  Difficult doing work/chores Not difficult at all - - - -    Details           Interpretation of Total Score  Total Score Depression Severity:  1-4 = Minimal depression, 5-9 = Mild depression, 10-14 = Moderate depression, 15-19 = Moderately severe depression, 20-27 = Severe depression   Psychosocial Evaluation and Intervention:   Psychosocial Re-Evaluation:   Psychosocial Discharge (Final Psychosocial  Re-Evaluation):   Vocational Rehabilitation: Provide vocational rehab assistance to qualifying candidates.   Vocational Rehab Evaluation & Intervention:  Vocational Rehab - 07/22/22 1028       Initial Vocational Rehab Evaluation & Intervention   Assessment shows need for Vocational Rehabilitation No   Pt is retired, no vocational needs            Education: Education Goals: Education classes will be provided on a weekly basis, covering required topics. Participant will state understanding/return demonstration of topics presented.     Core Videos: Exercise    Move It!  Clinical staff conducted group or individual video education with verbal and written material and guidebook.  Patient learns the recommended Pritikin exercise program. Exercise with the goal of living a long, healthy life. Some of the health benefits of exercise include controlled diabetes, healthier blood pressure levels, improved cholesterol levels, improved heart and lung capacity, improved sleep, and better body composition. Everyone should speak with their doctor before starting or changing an exercise routine.  Biomechanical Limitations Clinical staff conducted group or individual video education with verbal and written material and guidebook.  Patient learns how biomechanical limitations can impact exercise and how we can mitigate and possibly overcome limitations to have an impactful and balanced exercise routine.  Body Composition Clinical staff conducted group or individual video education with verbal and written material and guidebook.  Patient learns that body composition (ratio of muscle mass to fat mass) is a key component to assessing overall fitness, rather than body weight alone. Increased fat mass, especially visceral belly fat, can put [place] at increased risk for metabolic syndrome, type 2 diabetes, heart disease, and even death. It is recommended to combine diet and exercise (cardiovascular and  resistance training) to improve your body composition. Seek guidance from your physician and exercise physiologist before implementing an exercise routine.  Exercise Action Plan Clinical staff conducted group or individual video education with verbal and written material and guidebook.  Patient learns the recommended strategies to achieve and enjoy long-term exercise adherence, including variety, self-motivation, self-efficacy, and positive decision making. Benefits of exercise include fitness, good health, weight management, more energy, better sleep, less stress, and overall well-being.  Medical   Heart Disease Risk Reduction Clinical staff conducted group or individual video education with verbal and written material and guidebook.  Patient learns our heart is our most vital organ as it circulates oxygen, nutrients, white blood cells, and hormones throughout the entire body, and carries waste away. Data supports a plant-based eating plan like the Pritikin Program for its effectiveness in slowing progression of and reversing heart disease. The video provides a number of recommendations to address heart disease.   Metabolic Syndrome and Belly Fat  Clinical staff conducted group or individual video education with verbal and written material and guidebook.  Patient learns what metabolic syndrome is, how it leads to heart disease, and how one can reverse it and keep it from coming back. You have metabolic syndrome if you have 3 of the following 5 criteria: abdominal obesity, high blood pressure, high triglycerides, low HDL cholesterol, and high blood sugar.  Hypertension and Heart Disease Clinical staff conducted group or individual video education with verbal and written material and guidebook.  Patient learns that high blood pressure, or hypertension, is very common in the [place]. Hypertension is largely due to excessive salt intake, but other important risk factors include being overweight,  physical inactivity, drinking too much alcohol, smoking, and not eating enough potassium from fruits and vegetables. High blood pressure is a leading risk factor for heart attack, stroke, congestive heart failure, dementia, kidney failure, and premature death. Long-term effects of excessive salt intake include stiffening of the arteries and thickening of heart muscle and organ damage. Recommendations include ways to reduce hypertension and the risk of heart disease.  Diseases of Our Time - Focusing on Diabetes Clinical staff conducted group or individual video education with verbal and written material and guidebook.  Patient learns why the best way to stop diseases of our time is prevention, through food and other lifestyle changes. Medicine (  such as prescription pills and surgeries) is often only a Band-Aid on the problem, not a long-term solution. Most common diseases of our time include obesity, type 2 diabetes, hypertension, heart disease, and cancer. The Pritikin Program is recommended and has been proven to help reduce, reverse, and/or prevent the damaging effects of metabolic syndrome.  Nutrition   Overview of the Pritikin Eating Plan  Clinical staff conducted group or individual video education with verbal and written material and guidebook.  Patient learns about the Pritikin Eating Plan for disease risk reduction. The Pritikin Eating Plan emphasizes a wide variety of unrefined, minimally-processed carbohydrates, like fruits, vegetables, whole grains, and legumes. Go, Caution, and Stop food choices are explained. Plant-based and lean animal proteins are emphasized. Rationale provided for low sodium intake for blood pressure control, low added sugars for blood sugar stabilization, and low added fats and oils for coronary artery disease risk reduction and weight management.  Calorie Density  Clinical staff conducted group or individual video education with verbal and written material and  guidebook.  Patient learns about calorie density and how it impacts the Pritikin Eating Plan. Knowing the characteristics of the food you choose will help you decide whether those foods will lead to weight gain or weight loss, and whether you want to consume more or less of them. Weight loss is usually a side effect of the Pritikin Eating Plan because of its focus on low calorie-dense foods.  Label Reading  Clinical staff conducted group or individual video education with verbal and written material and guidebook.  Patient learns about the Pritikin recommended label reading guidelines and corresponding recommendations regarding calorie density, added sugars, sodium content, and whole grains.  Dining Out - Part 1  Clinical staff conducted group or individual video education with verbal and written material and guidebook.  Patient learns that restaurant meals can be sabotaging because they can be so high in calories, fat, sodium, and/or sugar. Patient learns recommended strategies on how to positively address this and avoid unhealthy pitfalls.  Facts on Fats  Clinical staff conducted group or individual video education with verbal and written material and guidebook.  Patient learns that lifestyle modifications can be just as effective, if not more so, as many medications for lowering your risk of heart disease. A Pritikin lifestyle can help to reduce your risk of inflammation and atherosclerosis (cholesterol build-up, or plaque, in the artery walls). Lifestyle interventions such as dietary choices and physical activity address the cause of atherosclerosis. A review of the types of fats and their impact on blood cholesterol levels, along with dietary recommendations to reduce fat intake is also included.  Nutrition Action Plan  Clinical staff conducted group or individual video education with verbal and written material and guidebook.  Patient learns how to incorporate Pritikin recommendations into  their lifestyle. Recommendations include planning and keeping personal health goals in mind as an important part of their success.  Healthy Mind-Set    Healthy Minds, Bodies, Hearts  Clinical staff conducted group or individual video education with verbal and written material and guidebook.  Patient learns how to identify when they are stressed. Video will discuss the impact of that stress, as well as the many benefits of stress management. Patient will also be introduced to stress management techniques. The way we think, act, and feel has an impact on our hearts.  How Our Thoughts Can Heal Our Hearts  Clinical staff conducted group or individual video education with verbal and written material and guidebook.  Patient learns that negative thoughts can cause depression and anxiety. This can result in negative lifestyle behavior and serious health problems. Cognitive behavioral therapy is an effective method to help control our thoughts in order to change and improve our emotional outlook.  Additional Videos:  Exercise    Improving Performance  Clinical staff conducted group or individual video education with verbal and written material and guidebook.  Patient learns to use a non-linear approach by alternating intensity levels and lengths of time spent exercising to help burn more calories and lose more body fat. Cardiovascular exercise helps improve heart health, metabolism, hormonal balance, blood sugar control, and recovery from fatigue. Resistance training improves strength, endurance, balance, coordination, reaction time, metabolism, and muscle mass. Flexibility exercise improves circulation, posture, and balance. Seek guidance from your physician and exercise physiologist before implementing an exercise routine and learn your capabilities and proper form for all exercise.  Introduction to Yoga  Clinical staff conducted group or individual video education with verbal and written material and  guidebook.  Patient learns about yoga, a discipline of the coming together of mind, breath, and body. The benefits of yoga include improved flexibility, improved range of motion, better posture and core strength, increased lung function, weight loss, and positive self-image. Yoga's heart health benefits include lowered blood pressure, healthier heart rate, decreased cholesterol and triglyceride levels, improved immune function, and reduced stress. Seek guidance from your physician and exercise physiologist before implementing an exercise routine and learn your capabilities and proper form for all exercise.  Medical   Aging: Enhancing Your Quality of Life  Clinical staff conducted group or individual video education with verbal and written material and guidebook.  Patient learns key strategies and recommendations to stay in good physical health and enhance quality of life, such as prevention strategies, having an advocate, securing a Health Care Proxy and Power of Attorney, and keeping a list of medications and system for tracking them. It also discusses how to avoid risk for bone loss.  Biology of Weight Control  Clinical staff conducted group or individual video education with verbal and written material and guidebook.  Patient learns that weight gain occurs because we consume more calories than we burn (eating more, moving less). Even if your body weight is normal, you may have higher ratios of fat compared to muscle mass. Too much body fat puts you at increased risk for cardiovascular disease, heart attack, stroke, type 2 diabetes, and obesity-related cancers. In addition to exercise, following the Pritikin Eating Plan can help reduce your risk.  Decoding Lab Results  Clinical staff conducted group or individual video education with verbal and written material and guidebook.  Patient learns that lab test reflects one measurement whose values change over time and are influenced by many factors,  including medication, stress, sleep, exercise, food, hydration, pre-existing medical conditions, and more. It is recommended to use the knowledge from this video to become more involved with your lab results and evaluate your numbers to speak with your doctor.   Diseases of Our Time - Overview  Clinical staff conducted group or individual video education with verbal and written material and guidebook.  Patient learns that according to the CDC, 50% to 70% of chronic diseases (such as obesity, type 2 diabetes, elevated lipids, hypertension, and heart disease) are avoidable through lifestyle improvements including healthier food choices, listening to satiety cues, and increased physical activity.  Sleep Disorders Clinical staff conducted group or individual video education with verbal and written material and guidebook.  Patient learns how good quality and duration of sleep are important to overall health and well-being. Patient also learns about sleep disorders and how they impact health along with recommendations to address them, including discussing with a physician.  Nutrition  Dining Out - Part 2 Clinical staff conducted group or individual video education with verbal and written material and guidebook.  Patient learns how to plan ahead and communicate in order to maximize their dining experience in a healthy and nutritious manner. Included are recommended food choices based on the type of restaurant the patient is visiting.   Fueling a Banker conducted group or individual video education with verbal and written material and guidebook.  There is a strong connection between our food choices and our health. Diseases like obesity and type 2 diabetes are very prevalent and are in large-part due to lifestyle choices. The Pritikin Eating Plan provides plenty of food and hunger-curbing satisfaction. It is easy to follow, affordable, and helps reduce health risks.  Menu Workshop   Clinical staff conducted group or individual video education with verbal and written material and guidebook.  Patient learns that restaurant meals can sabotage health goals because they are often packed with calories, fat, sodium, and sugar. Recommendations include strategies to plan ahead and to communicate with the manager, chef, or server to help order a healthier meal.  Planning Your Eating Strategy  Clinical staff conducted group or individual video education with verbal and written material and guidebook.  Patient learns about the Pritikin Eating Plan and its benefit of reducing the risk of disease. The Pritikin Eating Plan does not focus on calories. Instead, it emphasizes high-quality, nutrient-rich foods. By knowing the characteristics of the foods, we choose, we can determine their calorie density and make informed decisions.  Targeting Your Nutrition Priorities  Clinical staff conducted group or individual video education with verbal and written material and guidebook.  Patient learns that lifestyle habits have a tremendous impact on disease risk and progression. This video provides eating and physical activity recommendations based on your personal health goals, such as reducing LDL cholesterol, losing weight, preventing or controlling type 2 diabetes, and reducing high blood pressure.  Vitamins and Minerals  Clinical staff conducted group or individual video education with verbal and written material and guidebook.  Patient learns different ways to obtain key vitamins and minerals, including through a recommended healthy diet. It is important to discuss all supplements you take with your doctor.   Healthy Mind-Set    Smoking Cessation  Clinical staff conducted group or individual video education with verbal and written material and guidebook.  Patient learns that cigarette smoking and tobacco addiction pose a serious health risk which affects millions of people. Stopping smoking  will significantly reduce the risk of heart disease, lung disease, and many forms of cancer. Recommended strategies for quitting are covered, including working with your doctor to develop a successful plan.  Culinary   Becoming a Set designer conducted group or individual video education with verbal and written material and guidebook.  Patient learns that cooking at home can be healthy, cost-effective, quick, and puts them in control. Keys to cooking healthy recipes will include looking at your recipe, assessing your equipment needs, planning ahead, making it simple, choosing cost-effective seasonal ingredients, and limiting the use of added fats, salts, and sugars.  Cooking - Breakfast and Snacks  Clinical staff conducted group or individual video education with verbal and written material and guidebook.  Patient learns how important breakfast is to satiety and nutrition through the entire day. Recommendations include key foods to eat during breakfast to help stabilize blood sugar levels and to prevent overeating at meals later in the day. Planning ahead is also a key component.  Cooking - Educational psychologist conducted group or individual video education with verbal and written material and guidebook.  Patient learns eating strategies to improve overall health, including an approach to cook more at home. Recommendations include thinking of animal protein as a side on your plate rather than center stage and focusing instead on lower calorie dense options like vegetables, fruits, whole grains, and plant-based proteins, such as beans. Making sauces in large quantities to freeze for later and leaving the skin on your vegetables are also recommended to maximize your experience.  Cooking - Healthy Salads and Dressing Clinical staff conducted group or individual video education with verbal and written material and guidebook.  Patient learns that vegetables, fruits, whole  grains, and legumes are the foundations of the Pritikin Eating Plan. Recommendations include how to incorporate each of these in flavorful and healthy salads, and how to create homemade salad dressings. Proper handling of ingredients is also covered. Cooking - Soups and [employer] - Soups and Desserts Clinical staff conducted group or individual video education with verbal and written material and guidebook.  Patient learns that Pritikin soups and desserts make for easy, nutritious, and delicious snacks and meal components that are low in sodium, fat, sugar, and calorie density, while high in vitamins, minerals, and filling fiber. Recommendations include simple and healthy ideas for soups and desserts.   Overview     The Pritikin Solution Program Overview Clinical staff conducted group or individual video education with verbal and written material and guidebook.  Patient learns that the results of the Pritikin Program have been documented in more than 100 articles published in peer-reviewed journals, and the benefits include reducing risk factors for (and, in some cases, even reversing) high cholesterol, high blood pressure, type 2 diabetes, obesity, and more! An overview of the three key pillars of the Pritikin Program will be covered: eating well, doing regular exercise, and having a healthy mind-set.  WORKSHOPS  Exercise: Exercise Basics: Building Your Action Plan Clinical staff led group instruction and group discussion with PowerPoint presentation and patient guidebook. To enhance the learning environment the use of posters, models and videos may be added. At the conclusion of this workshop, patients will comprehend the difference between physical activity and exercise, as well as the benefits of incorporating both, into their routine. Patients will understand the FITT (Frequency, Intensity, Time, and Type) principle and how to use it to build an exercise action plan. In addition,  safety concerns and other considerations for exercise and cardiac rehab will be addressed by the presenter. The purpose of this lesson is to promote a comprehensive and effective weekly exercise routine in order to improve patients' overall level of fitness.   Managing Heart Disease: Your Path to a Healthier Heart Clinical staff led group instruction and group discussion with PowerPoint presentation and patient guidebook. To enhance the learning environment the use of posters, models and videos may be added.At the conclusion of this workshop, patients will understand the anatomy and physiology of the heart. Additionally, they will understand how Pritikin's three pillars impact the risk factors, the progression, and the management of heart disease.  The purpose of this lesson is to provide a high-level overview of the  heart, heart disease, and how the Pritikin lifestyle positively impacts risk factors.  Exercise Biomechanics Clinical staff led group instruction and group discussion with PowerPoint presentation and patient guidebook. To enhance the learning environment the use of posters, models and videos may be added. Patients will learn how the structural parts of their bodies function and how these functions impact their daily activities, movement, and exercise. Patients will learn how to promote a neutral spine, learn how to manage pain, and identify ways to improve their physical movement in order to promote healthy living. The purpose of this lesson is to expose patients to common physical limitations that impact physical activity. Participants will learn practical ways to adapt and manage aches and pains, and to minimize their effect on regular exercise. Patients will learn how to maintain good posture while sitting, walking, and lifting.  Balance Training and Fall Prevention  Clinical staff led group instruction and group discussion with PowerPoint presentation and patient guidebook. To  enhance the learning environment the use of posters, models and videos may be added. At the conclusion of this workshop, patients will understand the importance of their sensorimotor skills (vision, proprioception, and the vestibular system) in maintaining their ability to balance as they age. Patients will apply a variety of balancing exercises that are appropriate for their current level of function. Patients will understand the common causes for poor balance, possible solutions to these problems, and ways to modify their physical environment in order to minimize their fall risk. The purpose of this lesson is to teach patients about the importance of maintaining balance as they age and ways to minimize their risk of falling.  WORKSHOPS   Nutrition:  Fueling a Ship broker led group instruction and group discussion with PowerPoint presentation and patient guidebook. To enhance the learning environment the use of posters, models and videos may be added. Patients will review the foundational principles of the Pritikin Eating Plan and understand what constitutes a serving size in each of the food groups. Patients will also learn Pritikin-friendly foods that are better choices when away from home and review make-ahead meal and snack options. Calorie density will be reviewed and applied to three nutrition priorities: weight maintenance, weight loss, and weight gain. The purpose of this lesson is to reinforce (in a group setting) the key concepts around what patients are recommended to eat and how to apply these guidelines when away from home by planning and selecting Pritikin-friendly options. Patients will understand how calorie density may be adjusted for different weight management goals.  Mindful Eating  Clinical staff led group instruction and group discussion with PowerPoint presentation and patient guidebook. To enhance the learning environment the use of posters, models and videos may  be added. Patients will briefly review the concepts of the Pritikin Eating Plan and the importance of low-calorie dense foods. The concept of mindful eating will be introduced as well as the importance of paying attention to internal hunger signals. Triggers for non-hunger eating and techniques for dealing with triggers will be explored. The purpose of this lesson is to provide patients with the opportunity to review the basic principles of the Pritikin Eating Plan, discuss the value of eating mindfully and how to measure internal cues of hunger and fullness using the Hunger Scale. Patients will also discuss reasons for non-hunger eating and learn strategies to use for controlling emotional eating.  Targeting Your Nutrition Priorities Clinical staff led group instruction and group discussion with PowerPoint presentation and patient guidebook. To  enhance the learning environment the use of posters, models and videos may be added. Patients will learn how to determine their genetic susceptibility to disease by reviewing their family history. Patients will gain insight into the importance of diet as part of an overall healthy lifestyle in mitigating the impact of genetics and other environmental insults. The purpose of this lesson is to provide patients with the opportunity to assess their personal nutrition priorities by looking at their family history, their own health history and current risk factors. Patients will also be able to discuss ways of prioritizing and modifying the Pritikin Eating Plan for their highest risk areas  Menu  Clinical staff led group instruction and group discussion with PowerPoint presentation and patient guidebook. To enhance the learning environment the use of posters, models and videos may be added. Using menus brought in from [employer], or printed from [employer], patients will apply the Pritikin dining out guidelines that were presented in the [employer] video. Patients will also be able to practice these guidelines in a variety of provided scenarios. The purpose of this lesson is to provide patients with the opportunity to practice hands-on learning of the Pritikin Dining Out guidelines with actual menus and practice scenarios.  Label Reading Clinical staff led group instruction and group discussion with PowerPoint presentation and patient guidebook. To enhance the learning environment the use of posters, models and videos may be added. Patients will review and discuss the Pritikin label reading guidelines presented in Pritikin's Label Reading Educational series video. Using fool labels brought in from local grocery stores and markets, patients will apply the label reading guidelines and determine if the packaged food meet the Pritikin guidelines. The purpose of this lesson is to provide patients with the opportunity to review, discuss, and practice hands-on learning of the Pritikin Label Reading guidelines with actual packaged food labels. Cooking School  Pritikin's [employer] are designed to teach patients ways to prepare quick, simple, and affordable recipes at home. The importance of nutrition's role in chronic disease risk reduction is reflected in its emphasis in the overall Pritikin program. By learning how to prepare essential core Pritikin Eating Plan recipes, patients will increase control over what they eat; be able to customize the flavor of foods without the use of added salt, sugar, or fat; and improve the quality of the food they consume. By learning a set of core recipes which are easily assembled, quickly prepared, and affordable, patients are more likely to prepare more healthy foods at home. These workshops focus on convenient breakfasts, simple entres, side dishes, and desserts which can be prepared with minimal effort and are consistent with nutrition recommendations for cardiovascular risk reduction. Cooking  [employer] are taught by a Armed forces logistics/support/administrative officer (RD) who has been trained by the [employer]. The chef or RD has a clear understanding of the importance of minimizing - if not completely eliminating - added fat, sugar, and sodium in recipes. Throughout the series of Cooking School Workshop sessions, patients will learn about healthy ingredients and efficient methods of cooking to build confidence in their capability to prepare    Cooking School weekly topics:  Adding Flavor- Sodium-Free  Fast and Healthy Breakfasts  Powerhouse Plant-Based Proteins  Satisfying Salads and Dressings  Simple Sides and Sauces  International Cuisine-Spotlight on the [employer] Zones  Delicious Desserts  Savory Soups  [employer] - Meals in a Snap  Tasty Appetizers and Snacks  Comforting Weekend  Breakfasts  One-Pot Wonders   Fast Evening Meals  Landscape architect Your Pritikin Plate  WORKSHOPS   Healthy Mindset (Psychosocial):  Focused Goals, Sustainable Changes Clinical staff led group instruction and group discussion with PowerPoint presentation and patient guidebook. To enhance the learning environment the use of posters, models and videos may be added. Patients will be able to apply effective goal setting strategies to establish at least one personal goal, and then take consistent, meaningful action toward that goal. They will learn to identify common barriers to achieving personal goals and develop strategies to overcome them. Patients will also gain an understanding of how our mind-set can impact our ability to achieve goals and the importance of cultivating a positive and growth-oriented mind-set. The purpose of this lesson is to provide patients with a deeper understanding of how to set and achieve personal goals, as well as the tools and strategies needed to overcome common obstacles which may arise along the way.  From Head to Heart: The Power of a Healthy  Outlook  Clinical staff led group instruction and group discussion with PowerPoint presentation and patient guidebook. To enhance the learning environment the use of posters, models and videos may be added. Patients will be able to recognize and describe the impact of emotions and mood on physical health. They will discover the importance of self-care and explore self-care practices which may work for them. Patients will also learn how to utilize the 4 C's to cultivate a healthier outlook and better manage stress and challenges. The purpose of this lesson is to demonstrate to patients how a healthy outlook is an essential part of maintaining good health, especially as they continue their cardiac rehab journey.  Healthy Sleep for a Healthy Heart Clinical staff led group instruction and group discussion with PowerPoint presentation and patient guidebook. To enhance the learning environment the use of posters, models and videos may be added. At the conclusion of this workshop, patients will be able to demonstrate knowledge of the importance of sleep to overall health, well-being, and quality of life. They will understand the symptoms of, and treatments for, common sleep disorders. Patients will also be able to identify daytime and nighttime behaviors which impact sleep, and they will be able to apply these tools to help manage sleep-related challenges. The purpose of this lesson is to provide patients with a general overview of sleep and outline the importance of quality sleep. Patients will learn about a few of the most common sleep disorders. Patients will also be introduced to the concept of "sleep hygiene," and discover ways to self-manage certain sleeping problems through simple daily behavior changes. Finally, the workshop will motivate patients by clarifying the links between quality sleep and their goals of heart-healthy living.   Recognizing and Reducing Stress Clinical staff led group instruction and  group discussion with PowerPoint presentation and patient guidebook. To enhance the learning environment the use of posters, models and videos may be added. At the conclusion of this workshop, patients will be able to understand the types of stress reactions, differentiate between acute and chronic stress, and recognize the impact that chronic stress has on their health. They will also be able to apply different coping mechanisms, such as reframing negative self-talk. Patients will have the opportunity to practice a variety of stress management techniques, such as deep abdominal breathing, progressive muscle relaxation, and/or guided imagery.  The purpose of this lesson is to educate patients on the role of stress in their lives and  to provide healthy techniques for coping with it.  Learning Barriers/Preferences:  Learning Barriers/Preferences - 07/22/22 1028       Learning Barriers/Preferences   Learning Barriers Sight;Hearing   wears glasses, hearing aids R& L   Learning Preferences Group Instruction;Individual Instruction;Skilled Demonstration;Written Material;Computer/Internet             Education Topics:  Knowledge Questionnaire Score:  Knowledge Questionnaire Score - 07/22/22 1125       Knowledge Questionnaire Score   Pre Score 21/24             Core Components/Risk Factors/Patient Goals at Admission:  Personal Goals and Risk Factors at Admission - 07/22/22 1029       Core Components/Risk Factors/Patient Goals on Admission    Weight Management Yes;Weight Loss    Intervention Weight Management: Develop a combined nutrition and exercise program designed to reach desired caloric intake, while maintaining appropriate intake of nutrient and fiber, sodium and fats, and appropriate energy expenditure required for the weight goal.;Weight Management: Provide education and appropriate resources to help participant work on and attain dietary goals.;Weight Management/Obesity:  Establish reasonable short term and long term weight goals.    Admit Weight 206 lb 12.7 oz (93.8 kg)    Expected Outcomes Short Term: Continue to assess and modify interventions until short term weight is achieved;Long Term: Adherence to nutrition and physical activity/exercise program aimed toward attainment of established weight goal;Weight Loss: Understanding of general recommendations for a balanced deficit meal plan, which promotes 1-2 lb weight loss per week and includes a negative energy balance of [phone] kcal/d;Understanding recommendations for meals to include 15-35% energy as protein, 25-35% energy from fat, 35-60% energy from carbohydrates, less than 200mg  of dietary cholesterol, 20-35 gm of total fiber daily;Understanding of distribution of calorie intake throughout the day with the consumption of 4-5 meals/snacks    Hypertension Yes    Intervention Provide education on lifestyle modifcations including regular physical activity/exercise, weight management, moderate sodium restriction and increased consumption of fresh fruit, vegetables, and low fat dairy, alcohol moderation, and smoking cessation.;Monitor prescription use compliance.    Expected Outcomes Short Term: Continued assessment and intervention until BP is < 140/24mm HG in hypertensive participants. < 130/94mm HG in hypertensive participants with diabetes, heart failure or chronic kidney disease.;Long Term: Maintenance of blood pressure at goal levels.    Lipids Yes    Intervention Provide education and support for participant on nutrition & aerobic/resistive exercise along with prescribed medications to achieve LDL 70mg , HDL >40mg .    Expected Outcomes Short Term: Participant states understanding of desired cholesterol values and is compliant with medications prescribed. Participant is following exercise prescription and nutrition guidelines.;Long Term: Cholesterol controlled with medications as prescribed, with individualized  exercise RX and with personalized nutrition plan. Value goals: LDL < 70mg , HDL > 40 mg.             Core Components/Risk Factors/Patient Goals Review:    Core Components/Risk Factors/Patient Goals at Discharge (Final Review):    ITP Comments:  ITP Comments     Row Name 07/22/22 0914 08/08/22 0953         ITP Comments Armanda Magic, MD:  Medical Director.  Intorduction to the [employer] / Intensive Cardiac Rehab.  Initial orientation packet reviewed with the patient. 30 Day ITP Review. Exercise is currently on hold until seen and cleared to exercise per [facility] cardiology.               Comments: See ITP comments.  Exercise is currently on hold.[name] RN BSN

## 2022-08-11 ENCOUNTER — Encounter (HOSPITAL_COMMUNITY): Payer: Medicare PPO

## 2022-08-12 ENCOUNTER — Ambulatory Visit (INDEPENDENT_AMBULATORY_CARE_PROVIDER_SITE_OTHER): Payer: Medicare PPO

## 2022-08-12 VITALS — Ht 71.5 in | Wt 205.0 lb

## 2022-08-12 DIAGNOSIS — Z0000 Encounter for general adult medical examination without abnormal findings: Secondary | ICD-10-CM

## 2022-08-12 NOTE — Progress Notes (Deleted)
Subjective:   Francisco Larson is a 87 y.o. male who presents for Medicare Annual/Subsequent preventive examination.  Visit Complete: Virtual  I connected with  Francisco Larson on 08/12/22 by a audio enabled telemedicine application and verified that I am speaking with the correct person using two identifiers.  Patient Location: Home  Provider Location: Home Office  I discussed the limitations of evaluation and management by telemedicine. The patient expressed understanding and agreed to proceed.  Patient Medicare AWV questionnaire was completed by the patient on 08/08/2022; I have confirmed that all information answered by patient is correct and no changes since this date.  Vital Signs: Per patient no change in vitals since last visit.   Review of Systems    Cardiac Risk Factors include: advanced age (>71men, >82 women);dyslipidemia;male gender;Other (see comment), Risk factor comments: A-fib, CKD,TIA     Objective:    Today's Vitals   08/12/22 0816 08/12/22 0818  Weight: 205 lb (93 kg)   Height: 5' 11.5" (1.816 m)   PainSc:  0-No pain   Body mass index is 28.19 kg/m.     08/12/2022    8:25 AM 07/22/2021    1:42 PM 12/16/2019    6:41 AM 03/19/2016   12:42 PM 10/04/2014    1:27 PM 09/12/2014   11:02 AM  Advanced Directives  Does Patient Have a Medical Advance Directive? Yes Yes Yes Yes Yes Yes  Type of Estate agent of [place];Living will Living will;Healthcare Power of [employer] Power of [place];Living will Healthcare Power of [place];Living will Healthcare Power of [place];Living will   Does patient want to make changes to medical advance directive?  No - Patient declined   No - Patient declined   Copy of Healthcare Power of Attorney in Chart? No - copy requested Yes - validated most recent copy scanned in chart (See row information) No - copy requested No - copy requested No - copy requested     Current Medications  (verified) Outpatient Encounter Medications as of 08/12/2022  Medication Sig   amLODipine (NORVASC) 5 MG tablet Take 1 tablet (5 mg total) by mouth daily.   aspirin EC (ASPIRIN LOW DOSE) 81 MG tablet TAKE 1 TABLET (81 MG TOTAL) BY MOUTH DAILY. SWALLOW WHOLE.   atorvastatin (LIPITOR) 80 MG tablet Take 1 tablet (80 mg total) by mouth daily.   clopidogrel (PLAVIX) 75 MG tablet TAKE 1 TABLET BY MOUTH EVERY DAY   Multiple Vitamins-Minerals (CENTRUM SILVER PO) Take 1 tablet by mouth daily with breakfast.   nitroGLYCERIN (NITROSTAT) 0.4 MG SL tablet Place 1 tablet (0.4 mg total) under the tongue every 5 (five) minutes as needed for chest pain.   TYLENOL 325 MG tablet Take 325-650 mg by mouth every 6 (six) hours as needed for mild pain or headache.   carvedilol (COREG) 3.125 MG tablet Take 1 tablet (3.125 mg total) by mouth 2 (two) times daily with a meal. (Patient not taking: Reported on 08/12/2022)   No facility-administered encounter medications on file as of 08/12/2022.    Allergies (verified) Azithromycin   History: Past Medical History:  Diagnosis Date   Arthritis    Coronary artery disease    Hyperlipidemia    Leukemia, chronic lymphocytic (HCC)    SCCA (squamous cell carcinoma) of skin 08/08/2020   Mid Frontal Scalp (in situ) (curet and 5FU)   SCCA (squamous cell carcinoma) of skin 08/08/2020   Mid Parietal Scalp (in situ) (curet and 5FU)   SCCA (squamous cell  carcinoma) of skin 08/08/2020   Mid Supratip of Nose (in situ)   Shingles    Squamous cell carcinoma of skin 12/08/2018   right jaw line cis   Vitamin D deficiency    Past Surgical History:  Procedure Laterality Date   CARDIAC CATHETERIZATION     CARDIOVERSION N/A 12/16/2019   Procedure: CARDIOVERSION;  Surgeon: Thurmon Fair, MD;  Location: [facility] ENDOSCOPY;  Service: Cardiovascular;  Laterality: N/A;   CORONARY STENT INTERVENTION N/A 06/27/2022   Procedure: CORONARY STENT INTERVENTION;  Surgeon: Marykay Lex, MD;   Location: [facility] INVASIVE CV LAB;  Service: Cardiovascular;  Laterality: N/A;   EYE SURGERY     LAPAROSCOPIC TRANS ANAL AND TRANSABDOMINAL RECTAL RESECTION WITH COLOANAL ANASTOMOSIS     LEFT HEART CATH AND CORONARY ANGIOGRAPHY N/A 06/27/2022   Procedure: LEFT HEART CATH AND CORONARY ANGIOGRAPHY;  Surgeon: Marykay Lex, MD;  Location: [facility] INVASIVE CV LAB;  Service: Cardiovascular;  Laterality: N/A;   SURGERY OF LIP     tumor   TONSILLECTOMY     VASECTOMY     Family History  Problem Relation Age of Onset   Colon cancer Sister    Hyperlipidemia Sister    Stroke Sister    Heart disease Father    Emphysema Mother        never smoker, but exposed to 2nd hand from spouse   Cancer Mother    COPD Maternal Grandmother        never smoker   Stroke Maternal Grandfather    Diabetes Paternal Grandmother    Mental illness Paternal Grandmother    Asthma Sister    Social History   Socioeconomic History   Marital status: Married    Spouse name: Not on file   Number of children: 2   Years of education: 18   Highest education level: Professional school degree (e.g., MD, DDS, DVM, JD)  Occupational History   Occupation: Retired    Comment: Professor [employer]  Tobacco Use   Smoking status: Never   Smokeless tobacco: Never  Vaping Use   Vaping status: Never Used  Substance and Sexual Activity   Alcohol use: No   Drug use: No   Sexual activity: Not on file  Other Topics Concern   Not on file  Social History Narrative   Not on file   Social Determinants of Health   Financial Resource Strain: Low Risk  (08/12/2022)   Overall Financial Resource Strain (CARDIA)    Difficulty of Paying Living Expenses: Not hard at all  Food Insecurity: No Food Insecurity (08/12/2022)   Hunger Vital Sign    Worried About Running Out of Food in the Last Year: Never true    Ran Out of Food in the Last Year: Never true  Transportation Needs: No Transportation Needs (08/12/2022)   PRAPARE - Doctor, general practice (Medical): No    Lack of Transportation (Non-Medical): No  Physical Activity: Sufficiently Active (08/12/2022)   Exercise Vital Sign    Days of Exercise per Week: 6 days    Minutes of Exercise per Session: 60 min  Stress: No Stress Concern Present (08/12/2022)   [employer] of Occupational Health - Occupational Stress Questionnaire    Feeling of Stress : Only a little  Social Connections: Unknown (08/12/2022)   Social Connection and Isolation Panel [NHANES]    Frequency of Communication with Friends and Family: More than three times a week    Frequency of Social Gatherings with Friends  and Family: Twice a week    Attends Religious Services: Not on file    Active Member of Clubs or Organizations: Yes    Attends Banker Meetings: More than 4 times per year    Marital Status: Married    Tobacco Counseling Counseling given: Not Answered   Clinical Intake:  Pre-visit preparation completed: Yes  Pain Score: 0-No pain     BMI - recorded: 28.19 Nutritional Status: BMI 25 -29 Overweight Nutritional Risks: None Diabetes: No  How often do you need to have someone help you when you read instructions, pamphlets, or other written materials from your doctor or pharmacy?: 1 - Never  Interpreter Needed?: No  Information entered by ::  , RMA   Activities of Daily Living    08/12/2022    8:21 AM 08/08/2022    9:35 AM  In your present state of health, do you have any difficulty performing the following activities:  Hearing? 0 0  Vision? 0 0  Difficulty concentrating or making decisions? 0 0  Walking or climbing stairs? 0 0  Dressing or bathing? 0 0  Doing errands, shopping? 0 0  Preparing Food and eating ? N N  Using the Toilet? N N  In the past six months, have you accidently leaked urine? Y Y  Do you have problems with loss of bowel control? N N  Managing your Medications? N N  Managing your Finances? N N  Housekeeping or managing  your Housekeeping? N N    Patient Care Team: Etta Grandchild, MD as PCP - General (Internal Medicine) Wendall Stade, MD as PCP - Cardiology (Cardiology) Wendall Stade, MD as Consulting Physician (Cardiology) Ovidio Kin, MD as Consulting Physician (General Surgery) Lyn Records, MD (Inactive) as Consulting Physician (Cardiology) Janalyn Harder, MD (Inactive) as Consulting Physician (Dermatology) Lanier Prude, MD as Consulting Physician (Cardiology) Jaci Standard, MD as Consulting Physician (Hematology and Oncology)  Indicate any recent Medical Services you may have received from other than [facility] providers in the past year (date may be approximate).     Assessment:   This is a routine wellness examination for [place].  Hearing/Vision screen Hearing Screening - Comments:: Wears hearing aides Vision Screening - Comments:: Wears eyeglasses.  Dietary issues and exercise activities discussed:     Goals Addressed   None   Depression Screen    07/22/2022    9:33 AM 07/22/2021    1:40 PM 04/01/2021    7:58 AM 03/28/2020    8:05 AM 03/28/2019    8:19 AM 03/22/2018    8:11 AM 03/19/2016   12:43 PM  PHQ 2/9 Scores  PHQ - 2 Score 0 0 0 0 0 0 0  PHQ- 9 Score 1          Fall Risk    08/08/2022    9:35 AM 07/22/2022   11:26 AM 07/01/2022   11:49 AM 07/22/2021    1:43 PM 04/01/2021    7:58 AM  Fall Risk   Falls in the past year? 1 1 1  0 0  Number falls in past yr: 0 0 0 0   Injury with Fall? 0 0 0 0   Risk for fall due to :  History of fall(s);Impaired balance/gait;Impaired vision;Medication side effect;Orthopedic patient History of fall(s);Medication side effect No Fall Risks   Follow up  Falls evaluation completed Falls prevention discussed Falls evaluation completed     MEDICARE RISK AT HOME:   TIMED  UP AND GO:  Was the test performed?  No    Cognitive Function:        08/12/2022    8:25 AM 07/22/2021    1:50 PM  6CIT Screen  What Year?  0 points  What  month?  0 points  What time? 0 points 0 points  Count back from 20 0 points 0 points  Months in reverse 0 points 0 points  Repeat phrase 0 points 0 points  Total Score  0 points    Immunizations Immunization History  Administered Date(s) Administered   Fluad Quad(high Dose 65+) 11/22/2018, 11/14/2019, 11/22/2020, 11/12/2021   Influenza Split 11/27/2011   Influenza, High Dose Seasonal PF 11/08/2015, 11/05/2016, 11/25/2017   Influenza,inj,Quad PF,6+ Mos 11/29/2012, 10/31/2013   Influenza-Unspecified 11/17/2014   PFIZER(Purple Top)SARS-COV-2 Vaccination 01/28/2019, 02/17/2019, 09/05/2019, 04/12/2020   Pfizer Covid-19 Vaccine Bivalent Booster 59yrs & up 09/19/2020, 06/26/2021   Pneumococcal Conjugate-13 02/28/2014   Pneumococcal Polysaccharide-23 11/20/2009, 06/21/2015, 04/01/2021   Tdap 11/25/2005, 03/18/2017   Zoster Recombinant(Shingrix) 03/29/2020, 06/06/2020    TDAP status: Up to date  Flu Vaccine status: Up to date  Pneumococcal vaccine status: Up to date  Covid-19 vaccine status: Completed vaccines  Qualifies for Shingles Vaccine? Yes   Zostavax completed No   Shingrix Completed?: Yes  Screening Tests Health Maintenance  Topic Date Due   COVID-19 Vaccine (7 - 2023-24 season) 09/13/2021   INFLUENZA VACCINE  08/14/2022   Medicare Annual Wellness (AWV)  08/12/2023   DTaP/Tdap/Td (3 - Td or Tdap) 03/19/2027   Pneumonia Vaccine 73+ Years old  Completed   Zoster Vaccines- Shingrix  Completed   HPV VACCINES  Aged Out    Health Maintenance  Health Maintenance Due  Topic Date Due   COVID-19 Vaccine (7 - 2023-24 season) 09/13/2021    Colorectal cancer screening: No longer required.   Lung Cancer Screening: (Low Dose CT Chest recommended if Age 55-80 years, 20 pack-year currently smoking OR have quit w/in 15years.) does not qualify.   Lung Cancer Screening Referral: N/A  Additional Screening:  Hepatitis C Screening: does not qualify;   Vision Screening:  Recommended annual ophthalmology exams for early detection of glaucoma and other disorders of the eye. Is the patient up to date with their annual eye exam?  Yes  Who is the provider or what is the name of the office in which the patient attends annual eye exams? Dr. Emily Filbert If pt is not established with a provider, would they like to be referred to a provider to establish care? No .   Dental Screening: Recommended annual dental exams for proper oral hygiene   Community Resource Referral / Chronic Care Management: CRR required this visit?  No   CCM required this visit?  No     Plan:     I have personally reviewed and noted the following in the patient's chart:   Medical and social history Use of alcohol, tobacco or illicit drugs  Current medications and supplements including opioid prescriptions. Patient is not currently taking opioid prescriptions. Functional ability and status Nutritional status Physical activity Advanced directives List of other physicians Hospitalizations, surgeries, and ER visits in previous 12 months Vitals Screenings to include cognitive, depression, and falls Referrals and appointments  In addition, I have reviewed and discussed with patient certain preventive protocols, quality metrics, and best practice recommendations. A written personalized care plan for preventive services as well as general preventive health recommendations were provided to patient.      L ,  CMA   08/12/2022   After Visit Summary: (MyChart) Due to this being a telephonic visit, the after visit summary with patients personalized plan was offered to patient via MyChart   Nurse Notes: Patient is doing well. He has no concerns today.

## 2022-08-12 NOTE — Patient Instructions (Signed)
Francisco Larson , Thank you for taking time to come for your Medicare Wellness Visit. I appreciate your ongoing commitment to your health goals. Please review the following plan we discussed and let me know if I can assist you in the future.   Referrals/Orders/Follow-Ups/Clinician Recommendations: Keep up the good work.  Remember one day at a time!  This is a list of the screening recommended for you and due dates:  Health Maintenance  Topic Date Due   COVID-19 Vaccine (7 - 2023-24 season) 09/13/2021   Flu Shot  08/14/2022   Medicare Annual Wellness Visit  08/12/2023   DTaP/Tdap/Td vaccine (3 - Td or Tdap) 03/19/2027   Pneumonia Vaccine  Completed   Zoster (Shingles) Vaccine  Completed   HPV Vaccine  Aged Out    Advanced directives: (Copy Requested) Please bring a copy of your health care power of attorney and living will to the office to be added to your chart at your convenience.  Next Medicare Annual Wellness Visit scheduled for next year: Yes  Preventive Care 32 Years and Older, Male  Preventive care refers to lifestyle choices and visits with your health care provider that can promote health and wellness. What does preventive care include? A yearly physical exam. This is also called an annual well check. Dental exams once or twice a year. Routine eye exams. Ask your health care provider how often you should have your eyes checked. Personal lifestyle choices, including: Daily care of your teeth and gums. Regular physical activity. Eating a healthy diet. Avoiding tobacco and drug use. Limiting alcohol use. Practicing safe sex. Taking low doses of aspirin every day. Taking vitamin and mineral supplements as recommended by your health care provider. What happens during an annual well check? The services and screenings done by your health care provider during your annual well check will depend on your age, overall health, lifestyle risk factors, and family history of  disease. Counseling  Your health care provider may ask you questions about your: Alcohol use. Tobacco use. Drug use. Emotional well-being. Home and relationship well-being. Sexual activity. Eating habits. History of falls. Memory and ability to understand (cognition). Work and work Astronomer. Screening  You may have the following tests or measurements: Height, weight, and BMI. Blood pressure. Lipid and cholesterol levels. These may be checked every 5 years, or more frequently if you are over 51 years old. Skin check. Lung cancer screening. You may have this screening every year starting at age 59 if you have a 30-pack-year history of smoking and currently smoke or have quit within the past 15 years. Fecal occult blood test (FOBT) of the stool. You may have this test every year starting at age 59. Flexible sigmoidoscopy or colonoscopy. You may have a sigmoidoscopy every 5 years or a colonoscopy every 10 years starting at age 53. Prostate cancer screening. Recommendations will vary depending on your family history and other risks. Hepatitis C blood test. Hepatitis B blood test. Sexually transmitted disease (STD) testing. Diabetes screening. This is done by checking your blood sugar (glucose) after you have not eaten for a while (fasting). You may have this done every 1-3 years. Abdominal aortic aneurysm (AAA) screening. You may need this if you are a current or former smoker. Osteoporosis. You may be screened starting at age 2 if you are at high risk. Talk with your health care provider about your test results, treatment options, and if necessary, the need for more tests. Vaccines  Your health care provider may recommend  certain vaccines, such as: Influenza vaccine. This is recommended every year. Tetanus, diphtheria, and acellular pertussis (Tdap, Td) vaccine. You may need a Td booster every 10 years. Zoster vaccine. You may need this after age 89. Pneumococcal 13-valent  conjugate (PCV13) vaccine. One dose is recommended after age 14. Pneumococcal polysaccharide (PPSV23) vaccine. One dose is recommended after age 34. Talk to your health care provider about which screenings and vaccines you need and how often you need them. This information is not intended to replace advice given to you by your health care provider. Make sure you discuss any questions you have with your health care provider. Document Released: 01/26/2015 Document Revised: 09/19/2015 Document Reviewed: 10/31/2014 Elsevier Interactive Patient Education  2017 [employer].  Fall Prevention in the Home Falls can cause injuries. They can happen to people of all ages. There are many things you can do to make your home safe and to help prevent falls. What can I do on the outside of my home? Regularly fix the edges of walkways and driveways and fix any cracks. Remove anything that might make you trip as you walk through a door, such as a raised step or threshold. Trim any bushes or trees on the path to your home. Use bright outdoor lighting. Clear any walking paths of anything that might make someone trip, such as rocks or tools. Regularly check to see if handrails are loose or broken. Make sure that both sides of any steps have handrails. Any raised decks and porches should have guardrails on the edges. Have any leaves, snow, or ice cleared regularly. Use sand or salt on walking paths during winter. Clean up any spills in your garage right away. This includes oil or grease spills. What can I do in the bathroom? Use night lights. Install grab bars by the toilet and in the tub and shower. Do not use towel bars as grab bars. Use non-skid mats or decals in the tub or shower. If you need to sit down in the shower, use a plastic, non-slip stool. Keep the floor dry. Clean up any water that spills on the floor as soon as it happens. Remove soap buildup in the tub or shower regularly. Attach bath mats  securely with double-sided non-slip rug tape. Do not have throw rugs and other things on the floor that can make you trip. What can I do in the bedroom? Use night lights. Make sure that you have a light by your bed that is easy to reach. Do not use any sheets or blankets that are too big for your bed. They should not hang down onto the floor. Have a firm chair that has side arms. You can use this for support while you get dressed. Do not have throw rugs and other things on the floor that can make you trip. What can I do in the kitchen? Clean up any spills right away. Avoid walking on wet floors. Keep items that you use a lot in easy-to-reach places. If you need to reach something above you, use a strong step stool that has a grab bar. Keep electrical cords out of the way. Do not use floor polish or wax that makes floors slippery. If you must use wax, use non-skid floor wax. Do not have throw rugs and other things on the floor that can make you trip. What can I do with my stairs? Do not leave any items on the stairs. Make sure that there are handrails on both sides of the  stairs and use them. Fix handrails that are broken or loose. Make sure that handrails are as long as the stairways. Check any carpeting to make sure that it is firmly attached to the stairs. Fix any carpet that is loose or worn. Avoid having throw rugs at the top or bottom of the stairs. If you do have throw rugs, attach them to the floor with carpet tape. Make sure that you have a light switch at the top of the stairs and the bottom of the stairs. If you do not have them, ask someone to add them for you. What else can I do to help prevent falls? Wear shoes that: Do not have high heels. Have rubber bottoms. Are comfortable and fit you well. Are closed at the toe. Do not wear sandals. If you use a stepladder: Make sure that it is fully opened. Do not climb a closed stepladder. Make sure that both sides of the stepladder  are locked into place. Ask someone to hold it for you, if possible. Clearly mark and make sure that you can see: Any grab bars or handrails. First and last steps. Where the edge of each step is. Use tools that help you move around (mobility aids) if they are needed. These include: Canes. Walkers. Scooters. Crutches. Turn on the lights when you go into a dark area. Replace any light bulbs as soon as they burn out. Set up your furniture so you have a clear path. Avoid moving your furniture around. If any of your floors are uneven, fix them. If there are any pets around you, be aware of where they are. Review your medicines with your doctor. Some medicines can make you feel dizzy. This can increase your chance of falling. Ask your doctor what other things that you can do to help prevent falls. This information is not intended to replace advice given to you by your health care provider. Make sure you discuss any questions you have with your health care provider. Document Released: 10/26/2008 Document Revised: 06/07/2015 Document Reviewed: 02/03/2014 Elsevier Interactive Patient Education  2017 [employer].

## 2022-08-13 ENCOUNTER — Encounter (HOSPITAL_COMMUNITY): Payer: Medicare PPO

## 2022-08-14 NOTE — Progress Notes (Signed)
Office Visit    Patient Name: Francisco Larson Date of Encounter: 08/14/2022  Primary Care Provider:  Etta Grandchild, MD Primary Cardiologist:  Francisco Haws, MD Primary Electrophysiologist: None   Past Medical History    Past Medical History:  Diagnosis Date   Arthritis    Coronary artery disease    Hyperlipidemia    Leukemia, chronic lymphocytic (HCC)    SCCA (squamous cell carcinoma) of skin 08/08/2020   Mid Frontal Scalp (in situ) (curet and 5FU)   SCCA (squamous cell carcinoma) of skin 08/08/2020   Mid Parietal Scalp (in situ) (curet and 5FU)   SCCA (squamous cell carcinoma) of skin 08/08/2020   Mid Supratip of Nose (in situ)   Shingles    Squamous cell carcinoma of skin 12/08/2018   right jaw line cis   Vitamin D deficiency    Past Surgical History:  Procedure Laterality Date   CARDIAC CATHETERIZATION     CARDIOVERSION N/A 12/16/2019   Procedure: CARDIOVERSION;  Surgeon: Francisco Fair, MD;  Location: [facility] ENDOS[facility];  Service: Cardiovascular;  Laterality: N/A;   CORONARY STENT INTERVENTION N/A 06/27/2022   Procedure: CORONARY STENT INTERVENTION;  Surgeon: Francisco Lex, MD;  Location: [facility] INVASIVE CV LAB;  Service: Cardiovascular;  Laterality: N/A;   EYE SURGERY     LAPAROSCOPIC TRANS ANAL AND TRANSABDOMINAL RECTAL RESECTION WITH COLOANAL ANASTOMOSIS     LEFT HEART CATH AND CORONARY ANGIOGRAPHY N/A 06/27/2022   Procedure: LEFT HEART CATH AND CORONARY ANGIOGRAPHY;  Surgeon: Francisco Lex, MD;  Location: [facility] INVASIVE CV LAB;  Service: Cardiovascular;  Laterality: N/A;   SURGERY OF LIP     tumor   TONSILLECTOMY     VASECTOMY      Allergies  Allergies  Allergen Reactions   Azithromycin Itching and Other (See Comments)    Pt stated he thinks this made him dizzy, also     History of Present Illness    Francisco Larson  is a 87 year old male with a PMH of PACs, CAD s/p NSTEMI with DES to mid LAD left circumflex, CLL, HLD, atrial flutter s/p  DCCV, ILR with no recurrence, moderate AAS who presents today for complaint of bradycardia and ZIO monitor results.  [name] was seen initially in 2016 for complaint of PVCs by Dr. Eden Larson.  He had a known family history of CAD and underwent calcium scoring for risk stratification that showed score of 285.  He underwent ETT that showed no evidence of ischemia or ST elevation.  He was diagnosed with atrial flutter on 11/2019 and started on Eliquis and referred to EP.  He underwent DCCV with conversion to sinus rhythm.  He had ILR placed with no recurrence and Eliquis was discontinued 12/2019.  He was last seen by Dr. Eden Larson 11/2021 doing well with no new cardiac complaints.  He presented to the ED 06/26/2022 with complaint of chest pain elevations in troponin with no ischemic changes in EKG.  NSTEMI was diagnosed patient was started on heparin drip and underwent LHC that showed 80% stenosed lesion in mid LAD and 90% lesion in proximal circumflex that were treated with DES.  He was started on Plavix and aspirin and referred to cardiac rehab.  During patient's initial orientation he was found to have junctional rhythm with rate in the 40s and 50s.  Rhythm strips were reviewed by onsite provider Francisco Levering, NP and twelve-lead EKG was completed showing sinus with first-degree AVB and patient was instructed to hold  Coreg and a 3-day monitor was ordered to evaluate for heart block.  He was advised to refrain from rehab until follow-up visit.  3-day ZIO monitor results revealed predominantly sinus rhythm with first-degree AVB present and run run of tachycardia that was not sustained and 1 pause occurring lasting 3 seconds.  Since last being seen in the office patient reports that he is feeling much better and has not experienced any hypotension or bradycardia since previous visit.  He does report occasional pinching or twinge that he states is chronic not acute in nature.  Patient's blood pressure today was  controlled at 124/60 heart rate is 60 bpm.  He is tolerating his current medications without any adverse reactions. During today's visit we reviewed the results of his ZIO monitor and also obtained a copy of his latest loop recorder report.  patient denies chest pain, palpitations, dyspnea, PND, orthopnea, nausea, vomiting, dizziness, syncope, edema, weight gain, or early satiety.    Home Medications    Current Outpatient Medications  Medication Sig Dispense Refill   amLODipine (NORVASC) 5 MG tablet Take 1 tablet (5 mg total) by mouth daily. 30 tablet 3   aspirin EC (ASPIRIN LOW DOSE) 81 MG tablet TAKE 1 TABLET (81 MG TOTAL) BY MOUTH DAILY. SWALLOW WHOLE. 30 tablet 10   atorvastatin (LIPITOR) 80 MG tablet Take 1 tablet (80 mg total) by mouth daily. 30 tablet 2   carvedilol (COREG) 3.125 MG tablet Take 1 tablet (3.125 mg total) by mouth 2 (two) times daily with a meal. (Patient not taking: Reported on 08/12/2022) 60 tablet 2   clopidogrel (PLAVIX) 75 MG tablet TAKE 1 TABLET BY MOUTH EVERY DAY 90 tablet 3   Multiple Vitamins-Minerals (CENTRUM SILVER PO) Take 1 tablet by mouth daily with breakfast.     nitroGLYCERIN (NITROSTAT) 0.4 MG SL tablet Place 1 tablet (0.4 mg total) under the tongue every 5 (five) minutes as needed for chest pain. 25 tablet 3   TYLENOL 325 MG tablet Take 325-650 mg by mouth every 6 (six) hours as needed for mild pain or headache.     No current facility-administered medications for this visit.     Review of Systems  Please see the history of present illness.    (+) Fatigue (+) Chest discomfort  All other systems reviewed and are otherwise negative except as noted above.  Physical Exam    Wt Readings from Last 3 Encounters:  08/12/22 205 lb (93 kg)  07/22/22 206 lb 12.7 oz (93.8 kg)  07/07/22 206 lb (93.4 kg)   WG:NFAOZ were no vitals filed for this visit.,There is no height or weight on file to calculate BMI.  Constitutional:      Appearance: Healthy  appearance. Not in distress.  Neck:     Vascular: JVD normal.  Pulmonary:     Effort: Pulmonary effort is normal.     Breath sounds: No wheezing. No rales. Diminished in the bases Cardiovascular:     Normal rate. Regular rhythm. Normal S1. Normal S2.      Murmurs: There is no murmur.  Edema:    Peripheral edema absent.  Abdominal:     Palpations: Abdomen is soft non tender. There is no hepatomegaly.  Skin:    General: Skin is warm and dry.  Neurological:     General: No focal deficit present.     Mental Status: Alert and oriented to person, place and time.     Cranial Nerves: Cranial nerves are intact.  EKG/LABS/ Recent  Cardiac Studies    ECG personally reviewed by me today -none completed today  Lab Results  Component Value Date   WBC 20.9 (H) 06/28/2022   HGB 14.3 06/28/2022   HCT 43.3 06/28/2022   MCV 89.3 06/28/2022   PLT 156 06/28/2022   Lab Results  Component Value Date   CREATININE 1.04 06/28/2022   BUN 15 06/28/2022   NA 133 (L) 06/28/2022   K 4.0 06/28/2022   CL 102 06/28/2022   CO2 23 06/28/2022   Lab Results  Component Value Date   ALT 19 06/27/2022   AST 22 06/27/2022   ALKPHOS 79 06/27/2022   BILITOT 0.5 06/27/2022   Lab Results  Component Value Date   CHOL 122 06/27/2022   HDL 32 (L) 06/27/2022   LDLCALC 76 06/27/2022   TRIG 71 06/27/2022   CHOLHDL 3.8 06/27/2022    Lab Results  Component Value Date   HGBA1C 5.7 (H) 06/26/2022     Assessment & Plan    1.  Coronary artery disease: -s/p NSTEMI 06/2022 with PCI/DES to mid LAD and left circumflex -Patient denies chest pain since previous visit. Continue Lipitor 80 mg daily, Plavix 75 mg daily, ASA 81 mg daily  2.  Symptomatic bradycardia: -3-day Zio patch showed predominantly sinus rhythm with average heart rate of 60 bpm and first-degree AVB present 1 run of VT and 5 runs of SVT. -Today patient denies any symptomatic bradycardia and heart rate was 60 bpm. -Patient's Paceart report was  reviewed showing no malignant rhythms or pauses. -Patient is fine to proceed with cardiac rehab  3.  Paroxysmal atrial flutter: -s/p DCCV 12/2019 with ILR placed and no recurrence 3-day ZIO monitor no recurrence -Patient's beta-blocker was discontinued due to bradycardia -Patient denies any symptomatic bradycardia or atrial fibrillation since previous visit. -Patient is scheduled to have follow-up in November with EP.  4.  Chronic lymphocytic leukemia: -Patient currently followed by oncology  5.  Hyperlipidemia: -Patient's last LDL was 76 -Continue Lipitor 80 mg daily  Disposition: Follow-up with Francisco Haws, MD or APP as scheduled    Medication Adjustments/Labs and Tests Ordered: Current medicines are reviewed at length with the patient today.  Concerns regarding medicines are outlined above.   Signed, Napoleon Form, Leodis Rains, NP 08/14/2022, 11:42 AM [facility]

## 2022-08-15 ENCOUNTER — Ambulatory Visit: Payer: Medicare PPO | Attending: Nurse Practitioner | Admitting: Nurse Practitioner

## 2022-08-15 ENCOUNTER — Encounter (HOSPITAL_COMMUNITY): Payer: Medicare PPO

## 2022-08-15 ENCOUNTER — Encounter: Payer: Self-pay | Admitting: Nurse Practitioner

## 2022-08-15 VITALS — BP 124/60 | HR 60 | Ht 71.5 in | Wt 207.6 lb

## 2022-08-15 DIAGNOSIS — I4892 Unspecified atrial flutter: Secondary | ICD-10-CM | POA: Diagnosis not present

## 2022-08-15 DIAGNOSIS — E782 Mixed hyperlipidemia: Secondary | ICD-10-CM

## 2022-08-15 DIAGNOSIS — R001 Bradycardia, unspecified: Secondary | ICD-10-CM

## 2022-08-15 DIAGNOSIS — C9110 Chronic lymphocytic leukemia of B-cell type not having achieved remission: Secondary | ICD-10-CM

## 2022-08-15 DIAGNOSIS — I2510 Atherosclerotic heart disease of native coronary artery without angina pectoris: Secondary | ICD-10-CM

## 2022-08-15 NOTE — Patient Instructions (Signed)
Medication Instructions:  CONTINUE TO HOLD COREG (CARVEDILOL) *If you need a refill on your cardiac medications before your next appointment, please call your pharmacy*   Lab Work: NONE ORDERED   Testing/Procedures: NONE ORDERED   Follow-Up: At [facility], you and your health needs are our priority.  As part of our continuing mission to provide you with exceptional heart care, we have created designated Provider Care Teams.  These Care Teams include your primary Cardiologist (physician) and Advanced Practice Providers (APPs -  Physician Assistants and Nurse Practitioners) who all work together to provide you with the care you need, when you need it.  We recommend signing up for the patient portal called "MyChart".  Sign up information is provided on this After Visit Summary.  MyChart is used to connect with patients for Virtual Visits (Telemedicine).  Patients are able to view lab/test results, encounter notes, upcoming appointments, etc.  Non-urgent messages can be sent to your provider as well.   To learn more about what you can do with MyChart, go to [email or web address].    Your next appointment:    FOLLOW UP AS SCHEDULED  Provider:   Charlton Haws, MD     Other Instructions

## 2022-08-18 ENCOUNTER — Telehealth (HOSPITAL_COMMUNITY): Payer: Self-pay | Admitting: *Deleted

## 2022-08-18 ENCOUNTER — Encounter (HOSPITAL_COMMUNITY)
Admission: RE | Admit: 2022-08-18 | Discharge: 2022-08-18 | Disposition: A | Discharge status: Home / Self Care | Payer: Medicare PPO | Source: Ambulatory Visit | Attending: Cardiovascular Disease | Admitting: Cardiovascular Disease

## 2022-08-18 ENCOUNTER — Ambulatory Visit (INDEPENDENT_AMBULATORY_CARE_PROVIDER_SITE_OTHER): Payer: Medicare PPO

## 2022-08-18 DIAGNOSIS — R001 Bradycardia, unspecified: Secondary | ICD-10-CM | POA: Diagnosis not present

## 2022-08-18 DIAGNOSIS — I214 Non-ST elevation (NSTEMI) myocardial infarction: Secondary | ICD-10-CM | POA: Insufficient documentation

## 2022-08-18 DIAGNOSIS — Z48812 Encounter for surgical aftercare following surgery on the circulatory system: Secondary | ICD-10-CM | POA: Diagnosis not present

## 2022-08-18 DIAGNOSIS — I4892 Unspecified atrial flutter: Secondary | ICD-10-CM

## 2022-08-18 DIAGNOSIS — Z955 Presence of coronary angioplasty implant and graft: Secondary | ICD-10-CM | POA: Insufficient documentation

## 2022-08-18 NOTE — Progress Notes (Signed)
Daily Session Note  Patient Details  Name: Francisco Larson MRN: [id number] Date of Birth: 05-13-1935 Referring Provider:   Flowsheet Row INTENSIVE CARDIAC REHAB ORIENT from 07/22/2022 in [facility]  Referring Provider Charlton Haws, MD       Encounter Date: 08/18/2022  Check In:  Session Check In - 08/18/22 1751       Check-In   Supervising physician immediately available to respond to emergencies [facility] MD immediately available    Physician(s) Carlos Levering, NP    Location [facility]    Staff Present Lorin Picket, MS, ACSM-CEP, CCRP, Exercise Physiologist; Gerre Scull, RN, Zachery Conch, MS, ACSM-CEP, Exercise Physiologist;Carlette Les Pou, RN, BSN;Jetta Walker BS, ACSM-CEP, Exercise Physiologist    Virtual Visit No    Medication changes reported     Yes    Comments Coreg discontinued    Fall or balance concerns reported    Yes    Comments Patient fell last week at his residence, [facility], stubbed toe.    Tobacco Cessation No Change    Warm-up and Cool-down Performed as group-led instruction    Resistance Training Performed Yes    VAD Patient? No    PAD/SET Patient? No      Pain Assessment   Currently in Pain? No/denies    Pain Score 0-No pain    Multiple Pain Sites No             Capillary Blood Glucose: No results found for this or any previous visit (from the past 24 hour(s)).    Social History   Tobacco Use  Smoking Status Never  Smokeless Tobacco Never    Goals Met:  Exercise tolerated well No report of concerns or symptoms today Strength training completed today  Goals Unmet:  Not Applicable  Comments: Pt started cardiac rehab today. Pt tolerated light exercise without difficulty. VSS, telemetry-Sinus Rhythm, asymptomatic.  Medication list reconciled. Pt denies barriers to medicaiton compliance. PSYCHOSOCIAL ASSESSMENT:  PHQ-1. Pt exhibits positive coping  skills, hopeful outlook with supportive family. No psychosocial needs identified at this time, no psychosocial interventions necessary. Pt enjoys playing golf on Fridays, reading, writing, and volunteering at church. Pt oriented to exercise equipment and routine. Understanding verbalized.    Dr. Armanda Magic is Medical Director for Cardiac Rehab at [facility].

## 2022-08-18 NOTE — Telephone Encounter (Signed)
-----   Message from Napoleon Form, Leodis Rains sent at 08/15/2022  5:39 PM EDT ----- Patient was seen today in clinic for follow-up and reports no symptomatic bradycardia with stable blood pressure and heart rate.  He is fine to proceed with cardiac rehab at this time.

## 2022-08-20 ENCOUNTER — Encounter (HOSPITAL_COMMUNITY)
Admission: RE | Admit: 2022-08-20 | Discharge: 2022-08-20 | Disposition: A | Discharge status: Home / Self Care | Payer: Medicare PPO | Source: Ambulatory Visit | Attending: Cardiovascular Disease | Admitting: Cardiovascular Disease

## 2022-08-20 DIAGNOSIS — Z955 Presence of coronary angioplasty implant and graft: Secondary | ICD-10-CM | POA: Diagnosis not present

## 2022-08-20 DIAGNOSIS — I214 Non-ST elevation (NSTEMI) myocardial infarction: Secondary | ICD-10-CM | POA: Diagnosis not present

## 2022-08-20 DIAGNOSIS — R001 Bradycardia, unspecified: Secondary | ICD-10-CM | POA: Diagnosis not present

## 2022-08-20 DIAGNOSIS — Z48812 Encounter for surgical aftercare following surgery on the circulatory system: Secondary | ICD-10-CM | POA: Diagnosis not present

## 2022-08-22 ENCOUNTER — Encounter (HOSPITAL_COMMUNITY)
Admission: RE | Admit: 2022-08-22 | Discharge: 2022-08-22 | Disposition: A | Discharge status: Home / Self Care | Payer: Medicare PPO | Source: Ambulatory Visit | Attending: Cardiovascular Disease | Admitting: Cardiovascular Disease

## 2022-08-22 DIAGNOSIS — I214 Non-ST elevation (NSTEMI) myocardial infarction: Secondary | ICD-10-CM

## 2022-08-22 DIAGNOSIS — Z955 Presence of coronary angioplasty implant and graft: Secondary | ICD-10-CM

## 2022-08-22 DIAGNOSIS — R001 Bradycardia, unspecified: Secondary | ICD-10-CM | POA: Diagnosis not present

## 2022-08-22 DIAGNOSIS — Z48812 Encounter for surgical aftercare following surgery on the circulatory system: Secondary | ICD-10-CM | POA: Diagnosis not present

## 2022-08-25 ENCOUNTER — Encounter (HOSPITAL_COMMUNITY)
Admission: RE | Admit: 2022-08-25 | Discharge: 2022-08-25 | Disposition: A | Discharge status: Home / Self Care | Payer: Medicare PPO | Source: Ambulatory Visit | Attending: Cardiovascular Disease | Admitting: Cardiovascular Disease

## 2022-08-25 DIAGNOSIS — Z955 Presence of coronary angioplasty implant and graft: Secondary | ICD-10-CM | POA: Diagnosis not present

## 2022-08-25 DIAGNOSIS — I214 Non-ST elevation (NSTEMI) myocardial infarction: Secondary | ICD-10-CM | POA: Diagnosis not present

## 2022-08-25 DIAGNOSIS — R001 Bradycardia, unspecified: Secondary | ICD-10-CM | POA: Diagnosis not present

## 2022-08-25 DIAGNOSIS — Z48812 Encounter for surgical aftercare following surgery on the circulatory system: Secondary | ICD-10-CM | POA: Diagnosis not present

## 2022-08-25 NOTE — Progress Notes (Signed)
Subjective:   Francisco Larson is a 87 y.o. male who presents for Medicare Annual/Subsequent preventive examination.  Visit Complete: Virtual  I connected with  Francisco Larson on 08/25/22 by a audio enabled telemedicine application and verified that I am speaking with the correct person using two identifiers.  Patient Location: Home  Provider Location: Home Office  I discussed the limitations of evaluation and management by telemedicine. The patient expressed understanding and agreed to proceed.  Patient Medicare AWV questionnaire was completed by the patient on 08/08/2022; I have confirmed that all information answered by patient is correct and no changes since this date.  Vital Signs: Per patient no change in vitals since last visit.   Review of Systems    Cardiac Risk Factors include: advanced age (>81men, >30 women);dyslipidemia;male gender;Other (see comment), Risk factor comments: A-fib, CKD,TIA     Objective:    Today's Vitals   08/12/22 0816 08/12/22 0818  Weight: 205 lb (93 kg)   Height: 5' 11.5" (1.816 m)   PainSc:  0-No pain   Body mass index is 28.19 kg/m.     08/12/2022    8:25 AM 07/22/2021    1:42 PM 12/16/2019    6:41 AM 03/19/2016   12:42 PM 10/04/2014    1:27 PM 09/12/2014   11:02 AM  Advanced Directives  Does Patient Have a Medical Advance Directive? Yes Yes Yes Yes Yes Yes  Type of Estate agent of [place];Living will Living will;Healthcare Power of [employer] Power of [place];Living will Healthcare Power of [place];Living will Healthcare Power of [place];Living will   Does patient want to make changes to medical advance directive?  No - Patient declined   No - Patient declined   Copy of Healthcare Power of Attorney in Chart? No - copy requested Yes - validated most recent copy scanned in chart (See row information) No - copy requested No - copy requested No - copy requested     Current Medications  (verified) Outpatient Encounter Medications as of 08/12/2022  Medication Sig   amLODipine (NORVASC) 5 MG tablet Take 1 tablet (5 mg total) by mouth daily.   aspirin EC (ASPIRIN LOW DOSE) 81 MG tablet TAKE 1 TABLET (81 MG TOTAL) BY MOUTH DAILY. SWALLOW WHOLE.   atorvastatin (LIPITOR) 80 MG tablet Take 1 tablet (80 mg total) by mouth daily.   clopidogrel (PLAVIX) 75 MG tablet TAKE 1 TABLET BY MOUTH EVERY DAY   Multiple Vitamins-Minerals (CENTRUM SILVER PO) Take 1 tablet by mouth daily with breakfast.   nitroGLYCERIN (NITROSTAT) 0.4 MG SL tablet Place 1 tablet (0.4 mg total) under the tongue every 5 (five) minutes as needed for chest pain.   TYLENOL 325 MG tablet Take 325-650 mg by mouth every 6 (six) hours as needed for mild pain or headache.   carvedilol (COREG) 3.125 MG tablet Take 1 tablet (3.125 mg total) by mouth 2 (two) times daily with a meal. (Patient not taking: Reported on 08/12/2022)   No facility-administered encounter medications on file as of 08/12/2022.    Allergies (verified) Azithromycin   History: Past Medical History:  Diagnosis Date   Arthritis    Coronary artery disease    Hyperlipidemia    Leukemia, chronic lymphocytic (HCC)    SCCA (squamous cell carcinoma) of skin 08/08/2020   Mid Frontal Scalp (in situ) (curet and 5FU)   SCCA (squamous cell carcinoma) of skin 08/08/2020   Mid Parietal Scalp (in situ) (curet and 5FU)   SCCA (squamous cell  carcinoma) of skin 08/08/2020   Mid Supratip of Nose (in situ)   Shingles    Squamous cell carcinoma of skin 12/08/2018   right jaw line cis   Vitamin D deficiency    Past Surgical History:  Procedure Laterality Date   CARDIAC CATHETERIZATION     CARDIOVERSION N/A 12/16/2019   Procedure: CARDIOVERSION;  Surgeon: Thurmon Fair, MD;  Location: [facility] ENDOSCOPY;  Service: Cardiovascular;  Laterality: N/A;   CORONARY STENT INTERVENTION N/A 06/27/2022   Procedure: CORONARY STENT INTERVENTION;  Surgeon: Marykay Lex, MD;   Location: [facility] INVASIVE CV LAB;  Service: Cardiovascular;  Laterality: N/A;   EYE SURGERY     LAPAROSCOPIC TRANS ANAL AND TRANSABDOMINAL RECTAL RESECTION WITH COLOANAL ANASTOMOSIS     LEFT HEART CATH AND CORONARY ANGIOGRAPHY N/A 06/27/2022   Procedure: LEFT HEART CATH AND CORONARY ANGIOGRAPHY;  Surgeon: Marykay Lex, MD;  Location: [facility] INVASIVE CV LAB;  Service: Cardiovascular;  Laterality: N/A;   SURGERY OF LIP     tumor   TONSILLECTOMY     VASECTOMY     Family History  Problem Relation Age of Onset   Colon cancer Sister    Hyperlipidemia Sister    Stroke Sister    Heart disease Father    Emphysema Mother        never smoker, but exposed to 2nd hand from spouse   Cancer Mother    COPD Maternal Grandmother        never smoker   Stroke Maternal Grandfather    Diabetes Paternal Grandmother    Mental illness Paternal Grandmother    Asthma Sister    Social History   Socioeconomic History   Marital status: Married    Spouse name: Not on file   Number of children: 2   Years of education: 18   Highest education level: Professional school degree (e.g., MD, DDS, DVM, JD)  Occupational History   Occupation: Retired    Comment: Professor [employer]  Tobacco Use   Smoking status: Never   Smokeless tobacco: Never  Vaping Use   Vaping status: Never Used  Substance and Sexual Activity   Alcohol use: No   Drug use: No   Sexual activity: Not on file  Other Topics Concern   Not on file  Social History Narrative   Not on file   Social Determinants of Health   Financial Resource Strain: Low Risk  (08/12/2022)   Overall Financial Resource Strain (CARDIA)    Difficulty of Paying Living Expenses: Not hard at all  Food Insecurity: No Food Insecurity (08/12/2022)   Hunger Vital Sign    Worried About Running Out of Food in the Last Year: Never true    Ran Out of Food in the Last Year: Never true  Transportation Needs: No Transportation Needs (08/12/2022)   PRAPARE - Doctor, general practice (Medical): No    Lack of Transportation (Non-Medical): No  Physical Activity: Sufficiently Active (08/12/2022)   Exercise Vital Sign    Days of Exercise per Week: 6 days    Minutes of Exercise per Session: 60 min  Stress: No Stress Concern Present (08/12/2022)   [employer] of Occupational Health - Occupational Stress Questionnaire    Feeling of Stress : Only a little  Social Connections: Unknown (08/12/2022)   Social Connection and Isolation Panel [NHANES]    Frequency of Communication with Friends and Family: More than three times a week    Frequency of Social Gatherings with Friends  and Family: Twice a week    Attends Religious Services: Not on file    Active Member of Clubs or Organizations: Yes    Attends Banker Meetings: More than 4 times per year    Marital Status: Married    Tobacco Counseling Counseling given: Not Answered   Clinical Intake:  Pre-visit preparation completed: Yes  Pain Score: 0-No pain     BMI - recorded: 28.19 Nutritional Status: BMI 25 -29 Overweight Nutritional Risks: None Diabetes: No  How often do you need to have someone help you when you read instructions, pamphlets, or other written materials from your doctor or pharmacy?: 1 - Never  Interpreter Needed?: No  Information entered by ::  , RMA   Activities of Daily Living    08/12/2022    8:21 AM 08/08/2022    9:35 AM  In your present state of health, do you have any difficulty performing the following activities:  Hearing? 0 0  Vision? 0 0  Difficulty concentrating or making decisions? 0 0  Walking or climbing stairs? 0 0  Dressing or bathing? 0 0  Doing errands, shopping? 0 0  Preparing Food and eating ? N N  Using the Toilet? N N  In the past six months, have you accidently leaked urine? Y Y  Do you have problems with loss of bowel control? N N  Managing your Medications? N N  Managing your Finances? N N  Housekeeping or managing  your Housekeeping? N N    Patient Care Team: Etta Grandchild, MD as PCP - General (Internal Medicine) Wendall Stade, MD as PCP - Cardiology (Cardiology) Wendall Stade, MD as Consulting Physician (Cardiology) Ovidio Kin, MD as Consulting Physician (General Surgery) Lyn Records, MD (Inactive) as Consulting Physician (Cardiology) Janalyn Harder, MD (Inactive) as Consulting Physician (Dermatology) Lanier Prude, MD as Consulting Physician (Cardiology) Jaci Standard, MD as Consulting Physician (Hematology and Oncology) Manning Charity, OD as Referring Physician (Optometry)  Indicate any recent Medical Services you may have received from other than [facility] providers in the past year (date may be approximate).     Assessment:   This is a routine wellness examination for Francisco Larson.  Hearing/Vision screen Hearing Screening - Comments:: Wears hearing aides Vision Screening - Comments:: Wears eyeglasses.  Dietary issues and exercise activities discussed:     Goals Addressed   None   Depression Screen    08/12/2022   10:03 AM 07/22/2022    9:33 AM 07/22/2021    1:40 PM 04/01/2021    7:58 AM 03/28/2020    8:05 AM 03/28/2019    8:19 AM 03/22/2018    8:11 AM  PHQ 2/9 Scores  PHQ - 2 Score 0 0 0 0 0 0 0  PHQ- 9 Score 1 1         Fall Risk    08/25/2022    8:42 AM 08/22/2022    9:13 AM 08/20/2022    8:54 AM 08/18/2022    8:21 AM 08/08/2022    9:35 AM  Fall Risk   Falls in the past year? 1 1 1 1 1   Number falls in past yr: 1 1 1 1  0  Injury with Fall? 0 0 0 0 0  Risk for fall due to : History of fall(s);Impaired balance/gait;Impaired vision;Medication side effect;Orthopedic patient History of fall(s);Impaired balance/gait;Impaired vision;Medication side effect;Orthopedic patient History of fall(s);Impaired balance/gait;Medication side effect;Impaired vision;Orthopedic patient History of fall(s);Impaired balance/gait;Impaired vision;Orthopedic patient;Medication side effect  Follow  up Falls evaluation completed Falls evaluation completed Falls evaluation completed Falls evaluation completed;Falls prevention discussed     MEDICARE RISK AT HOME:   TIMED UP AND GO:  Was the test performed?  No    Cognitive Function:        08/12/2022    8:25 AM 07/22/2021    1:50 PM  6CIT Screen  What Year?  0 points  What month?  0 points  What time? 0 points 0 points  Count back from 20 0 points 0 points  Months in reverse 0 points 0 points  Repeat phrase 0 points 0 points  Total Score  0 points    Immunizations Immunization History  Administered Date(s) Administered   Fluad Quad(high Dose 65+) 11/22/2018, 11/14/2019, 11/22/2020, 11/12/2021   Influenza Split 11/27/2011   Influenza, High Dose Seasonal PF 11/08/2015, 11/05/2016, 11/25/2017   Influenza,inj,Quad PF,6+ Mos 11/29/2012, 10/31/2013   Influenza-Unspecified 11/17/2014   PFIZER(Purple Top)SARS-COV-2 Vaccination 01/28/2019, 02/17/2019, 09/05/2019, 04/12/2020   Pfizer Covid-19 Vaccine Bivalent Booster 76yrs & up 09/19/2020, 06/26/2021   Pneumococcal Conjugate-13 02/28/2014   Pneumococcal Polysaccharide-23 11/20/2009, 06/21/2015, 04/01/2021   Tdap 11/25/2005, 03/18/2017   Zoster Recombinant(Shingrix) 03/29/2020, 06/06/2020    TDAP status: Up to date  Flu Vaccine status: Up to date  Pneumococcal vaccine status: Up to date  Covid-19 vaccine status: Completed vaccines  Qualifies for Shingles Vaccine? Yes   Zostavax completed No   Shingrix Completed?: Yes  Screening Tests Health Maintenance  Topic Date Due   COVID-19 Vaccine (7 - 2023-24 season) 09/13/2021   INFLUENZA VACCINE  08/14/2022   Medicare Annual Wellness (AWV)  08/12/2023   DTaP/Tdap/Td (3 - Td or Tdap) 03/19/2027   Pneumonia Vaccine 43+ Years old  Completed   Zoster Vaccines- Shingrix  Completed   HPV VACCINES  Aged Out    Health Maintenance  Health Maintenance Due  Topic Date Due   COVID-19 Vaccine (7 - 2023-24 season) 09/13/2021    INFLUENZA VACCINE  08/14/2022    Colorectal cancer screening: No longer required.   Lung Cancer Screening: (Low Dose CT Chest recommended if Age 34-80 years, 20 pack-year currently smoking OR have quit w/in 15years.) does not qualify.   Lung Cancer Screening Referral: N/A  Additional Screening:  Hepatitis C Screening: does not qualify;   Vision Screening: Recommended annual ophthalmology exams for early detection of glaucoma and other disorders of the eye. Is the patient up to date with their annual eye exam?  Yes  Who is the provider or what is the name of the office in which the patient attends annual eye exams? Dr. Emily Filbert If pt is not established with a provider, would they like to be referred to a provider to establish care? No .   Dental Screening: Recommended annual dental exams for proper oral hygiene   Community Resource Referral / Chronic Care Management: CRR required this visit?  No   CCM required this visit?  No     Plan:     I have personally reviewed and noted the following in the patient's chart:   Medical and social history Use of alcohol, tobacco or illicit drugs  Current medications and supplements including opioid prescriptions. Patient is not currently taking opioid prescriptions. Functional ability and status Nutritional status Physical activity Advanced directives List of other physicians Hospitalizations, surgeries, and ER visits in previous 12 months Vitals Screenings to include cognitive, depression, and falls Referrals and appointments  In addition, I have reviewed and discussed with patient certain preventive  protocols, quality metrics, and best practice recommendations. A written personalized care plan for preventive services as well as general preventive health recommendations were provided to patient.      L , CMA   08/12/2022   After Visit Summary: (MyChart) Due to this being a telephonic visit, the after visit summary with  patients personalized plan was offered to patient via MyChart   Nurse Notes: Patient is doing well. He has no concerns today.

## 2022-08-27 ENCOUNTER — Encounter (HOSPITAL_COMMUNITY)
Admission: RE | Admit: 2022-08-27 | Discharge: 2022-08-27 | Disposition: A | Discharge status: Home / Self Care | Payer: Medicare PPO | Source: Ambulatory Visit | Attending: Cardiovascular Disease | Admitting: Cardiovascular Disease

## 2022-08-27 DIAGNOSIS — Z955 Presence of coronary angioplasty implant and graft: Secondary | ICD-10-CM | POA: Diagnosis not present

## 2022-08-27 DIAGNOSIS — I214 Non-ST elevation (NSTEMI) myocardial infarction: Secondary | ICD-10-CM

## 2022-08-27 DIAGNOSIS — Z48812 Encounter for surgical aftercare following surgery on the circulatory system: Secondary | ICD-10-CM | POA: Diagnosis not present

## 2022-08-27 DIAGNOSIS — R001 Bradycardia, unspecified: Secondary | ICD-10-CM | POA: Diagnosis not present

## 2022-08-28 ENCOUNTER — Encounter (INDEPENDENT_AMBULATORY_CARE_PROVIDER_SITE_OTHER): Payer: Self-pay

## 2022-08-29 ENCOUNTER — Encounter (HOSPITAL_COMMUNITY)
Admission: RE | Admit: 2022-08-29 | Discharge: 2022-08-29 | Disposition: A | Discharge status: Home / Self Care | Payer: Medicare PPO | Source: Ambulatory Visit | Attending: Cardiovascular Disease | Admitting: Cardiovascular Disease

## 2022-08-29 DIAGNOSIS — Z955 Presence of coronary angioplasty implant and graft: Secondary | ICD-10-CM | POA: Diagnosis not present

## 2022-08-29 DIAGNOSIS — R001 Bradycardia, unspecified: Secondary | ICD-10-CM | POA: Diagnosis not present

## 2022-08-29 DIAGNOSIS — Z48812 Encounter for surgical aftercare following surgery on the circulatory system: Secondary | ICD-10-CM | POA: Diagnosis not present

## 2022-08-29 DIAGNOSIS — I214 Non-ST elevation (NSTEMI) myocardial infarction: Secondary | ICD-10-CM

## 2022-09-01 ENCOUNTER — Encounter (HOSPITAL_COMMUNITY)
Admission: RE | Admit: 2022-09-01 | Discharge: 2022-09-01 | Disposition: A | Discharge status: Home / Self Care | Payer: Medicare PPO | Source: Ambulatory Visit | Attending: Cardiovascular Disease | Admitting: Cardiovascular Disease

## 2022-09-01 DIAGNOSIS — I214 Non-ST elevation (NSTEMI) myocardial infarction: Secondary | ICD-10-CM

## 2022-09-01 DIAGNOSIS — R001 Bradycardia, unspecified: Secondary | ICD-10-CM

## 2022-09-01 DIAGNOSIS — Z955 Presence of coronary angioplasty implant and graft: Secondary | ICD-10-CM | POA: Diagnosis not present

## 2022-09-01 DIAGNOSIS — Z48812 Encounter for surgical aftercare following surgery on the circulatory system: Secondary | ICD-10-CM | POA: Diagnosis not present

## 2022-09-01 NOTE — Progress Notes (Signed)
Reviewed home exercise Rx with patient today.  Encouraged warm-up, cool-down, and stretching. Reviewed THRR of 54-109 and keeping RPE between 11-13. Encouraged to hydrate with activity.  Reviewed weather parameters for temperature and humidity for safe exercise outdoors. Reviewed S/S to terminate exercise and when to call 911 vs MD. Reviewed the use of NTG and pt was encouraged to carry at all times. Pt encouraged to always carry a cell phone for safety when exercising outdoors. Pt verbalized understanding of the home exercise Rx and was provided a copy.   Lorin Picket MS, ACSM-CEP, CCRP

## 2022-09-02 NOTE — Progress Notes (Signed)
Carelink Summary Report / Loop Recorder 

## 2022-09-03 ENCOUNTER — Emergency Department (HOSPITAL_COMMUNITY)
Admission: EM | Admit: 2022-09-03 | Discharge: 2022-09-03 | Disposition: A | Discharge status: Home / Self Care | Payer: Medicare PPO | Attending: Emergency Medicine | Admitting: Emergency Medicine

## 2022-09-03 ENCOUNTER — Encounter (HOSPITAL_COMMUNITY)
Admission: RE | Admit: 2022-09-03 | Discharge: 2022-09-03 | Disposition: A | Discharge status: Home / Self Care | Payer: Medicare PPO | Source: Ambulatory Visit | Attending: Cardiovascular Disease | Admitting: Cardiovascular Disease

## 2022-09-03 ENCOUNTER — Other Ambulatory Visit: Payer: Self-pay

## 2022-09-03 ENCOUNTER — Encounter (HOSPITAL_COMMUNITY): Payer: Self-pay

## 2022-09-03 DIAGNOSIS — Z7982 Long term (current) use of aspirin: Secondary | ICD-10-CM | POA: Insufficient documentation

## 2022-09-03 DIAGNOSIS — I252 Old myocardial infarction: Secondary | ICD-10-CM | POA: Diagnosis not present

## 2022-09-03 DIAGNOSIS — R001 Bradycardia, unspecified: Secondary | ICD-10-CM | POA: Diagnosis not present

## 2022-09-03 DIAGNOSIS — R059 Cough, unspecified: Secondary | ICD-10-CM | POA: Insufficient documentation

## 2022-09-03 DIAGNOSIS — I455 Other specified heart block: Secondary | ICD-10-CM

## 2022-09-03 DIAGNOSIS — I2510 Atherosclerotic heart disease of native coronary artery without angina pectoris: Secondary | ICD-10-CM

## 2022-09-03 DIAGNOSIS — N189 Chronic kidney disease, unspecified: Secondary | ICD-10-CM | POA: Insufficient documentation

## 2022-09-03 DIAGNOSIS — Z48812 Encounter for surgical aftercare following surgery on the circulatory system: Secondary | ICD-10-CM | POA: Diagnosis not present

## 2022-09-03 DIAGNOSIS — Z955 Presence of coronary angioplasty implant and graft: Secondary | ICD-10-CM

## 2022-09-03 DIAGNOSIS — R5383 Other fatigue: Secondary | ICD-10-CM | POA: Diagnosis not present

## 2022-09-03 DIAGNOSIS — I214 Non-ST elevation (NSTEMI) myocardial infarction: Secondary | ICD-10-CM

## 2022-09-03 LAB — CBC WITH DIFFERENTIAL/PLATELET
Abs Immature Granulocytes: 0 10*3/uL (ref 0.00–0.07)
Basophils Absolute: 0 10*3/uL (ref 0.0–0.1)
Basophils Relative: 0 %
Eosinophils Absolute: 0 10*3/uL (ref 0.0–0.5)
Eosinophils Relative: 0 %
HCT: 46.6 % (ref 39.0–52.0)
Hemoglobin: 15.5 g/dL (ref 13.0–17.0)
Lymphocytes Relative: 81 %
Lymphs Abs: 17.6 10*3/uL — ABNORMAL HIGH (ref 0.7–4.0)
MCH: 30.8 pg (ref 26.0–34.0)
MCHC: 33.3 g/dL (ref 30.0–36.0)
MCV: 92.6 fL (ref 80.0–100.0)
Monocytes Absolute: 1.1 10*3/uL — ABNORMAL HIGH (ref 0.1–1.0)
Monocytes Relative: 5 %
Neutro Abs: 3 10*3/uL (ref 1.7–7.7)
Neutrophils Relative %: 14 %
Platelets: 162 10*3/uL (ref 150–400)
RBC: 5.03 MIL/uL (ref 4.22–5.81)
RDW: 13.4 % (ref 11.5–15.5)
WBC: 21.7 10*3/uL — ABNORMAL HIGH (ref 4.0–10.5)
nRBC: 0.1 % (ref 0.0–0.2)

## 2022-09-03 LAB — COMPREHENSIVE METABOLIC PANEL
ALT: 28 U/L (ref 0–44)
AST: 28 U/L (ref 15–41)
Albumin: 3.2 g/dL — ABNORMAL LOW (ref 3.5–5.0)
Alkaline Phosphatase: 85 U/L (ref 38–126)
Anion gap: 9 (ref 5–15)
BUN: 14 mg/dL (ref 8–23)
CO2: 27 mmol/L (ref 22–32)
Calcium: 9.1 mg/dL (ref 8.9–10.3)
Chloride: 101 mmol/L (ref 98–111)
Creatinine, Ser: 1.29 mg/dL — ABNORMAL HIGH (ref 0.61–1.24)
GFR, Estimated: 54 mL/min — ABNORMAL LOW (ref >60)
Glucose, Bld: 96 mg/dL (ref 70–99)
Potassium: 4.4 mmol/L (ref 3.5–5.1)
Sodium: 137 mmol/L (ref 135–145)
Total Bilirubin: 0.7 mg/dL (ref 0.3–1.2)
Total Protein: 5.9 g/dL — ABNORMAL LOW (ref 6.5–8.1)

## 2022-09-03 LAB — MAGNESIUM: Magnesium: 2.2 mg/dL (ref 1.7–2.4)

## 2022-09-03 LAB — TSH: TSH: 4.543 u[IU]/mL — ABNORMAL HIGH (ref 0.350–4.500)

## 2022-09-03 LAB — TROPONIN I (HIGH SENSITIVITY)
Troponin I (High Sensitivity): 12 ng/L (ref <18)
Troponin I (High Sensitivity): 11 ng/L (ref <18)

## 2022-09-03 NOTE — Discharge Instructions (Addendum)
Your history and evaluation today are consistent with bradycardia on the monitor however you were asymptomatic.  We had cardiology see you and they feel you are safe for discharge home.  Please follow-up with them and your primary team.  Any symptoms develop, change, or worsen, please return to the nearest emergency department.

## 2022-09-03 NOTE — ED Provider Notes (Signed)
[place] EMERGENCY DEPARTMENT AT [facility] Provider Note   CSN: [id number] Arrival date & time: 09/03/22  1010     History  No chief complaint on file.   Francisco Larson is a 87 y.o. male.  The history is provided by the patient and medical records. No language interpreter was used.  Illness Location:  Bradycardia at cardiac rehab Severity:  Unable to specify Onset quality:  Unable to specify Timing:  Unable to specify Progression:  Unable to specify Associated symptoms: cough (chronic for years) and fatigue (yesterday but not today)   Associated symptoms: no abdominal pain, no chest pain, no congestion, no diarrhea, no fever, no headaches, no nausea, no rash, no shortness of breath, no vomiting and no wheezing        Home Medications Prior to Admission medications   Medication Sig Start Date End Date Taking? Authorizing Provider  amLODipine (NORVASC) 5 MG tablet Take 1 tablet (5 mg total) by mouth daily. 06/29/22   Meredeth Ide, MD  aspirin EC (ASPIRIN LOW DOSE) 81 MG tablet TAKE 1 TABLET (81 MG TOTAL) BY MOUTH DAILY. SWALLOW WHOLE. 07/28/22   Wendall Stade, MD  atorvastatin (LIPITOR) 80 MG tablet Take 1 tablet (80 mg total) by mouth daily. 06/29/22   Meredeth Ide, MD  carvedilol (COREG) 3.125 MG tablet Take 1 tablet (3.125 mg total) by mouth 2 (two) times daily with a meal. Patient not taking: Reported on 08/12/2022 06/28/22   Meredeth Ide, MD  clopidogrel (PLAVIX) 75 MG tablet TAKE 1 TABLET BY MOUTH EVERY DAY 07/15/22   Sharlene Dory, PA-C  Multiple Vitamins-Minerals (CENTRUM SILVER PO) Take 1 tablet by mouth daily with breakfast.    [provider]  nitroGLYCERIN (NITROSTAT) 0.4 MG SL tablet Place 1 tablet (0.4 mg total) under the tongue every 5 (five) minutes as needed for chest pain. 07/07/22   Sharlene Dory, PA-C  TYLENOL 325 MG tablet Take 325-650 mg by mouth every 6 (six) hours as needed for mild pain or headache.    [provider]       Allergies    Azithromycin    Review of Systems   Review of Systems  Constitutional:  Positive for fatigue (yesterday but not today). Negative for chills and fever.  HENT:  Negative for congestion.   Respiratory:  Positive for cough (chronic for years). Negative for choking, chest tightness, shortness of breath and wheezing.   Cardiovascular:  Negative for chest pain and palpitations.  Gastrointestinal:  Negative for abdominal pain, constipation, diarrhea, nausea and vomiting.  Genitourinary:  Negative for dysuria, flank pain and frequency.  Musculoskeletal:  Negative for back pain, neck pain and neck stiffness.  Skin:  Negative for rash and wound.  Neurological:  Negative for weakness, light-headedness and headaches.  Psychiatric/Behavioral:  Negative for agitation and confusion.   All other systems reviewed and are negative.   Physical Exam Updated Vital Signs There were no vitals taken for this visit. Physical Exam Vitals and nursing note reviewed.  Constitutional:      General: He is not in acute distress.    Appearance: He is well-developed. He is not ill-appearing, toxic-appearing or diaphoretic.  HENT:     Head: Normocephalic and atraumatic.     Nose: No congestion or rhinorrhea.     Mouth/Throat:     Mouth: Mucous membranes are moist.     Pharynx: No oropharyngeal exudate or posterior oropharyngeal erythema.  Eyes:  Conjunctiva/sclera: Conjunctivae normal.     Pupils: Pupils are equal, round, and reactive to light.  Cardiovascular:     Rate and Rhythm: Regular rhythm. Bradycardia present.     Heart sounds: No murmur heard. Pulmonary:     Effort: Pulmonary effort is normal. No respiratory distress.     Breath sounds: Normal breath sounds. No wheezing, rhonchi or rales.  Chest:     Chest wall: No tenderness.  Abdominal:     General: Abdomen is flat.     Palpations: Abdomen is soft.     Tenderness: There is no abdominal tenderness. There is no right CVA  tenderness, left CVA tenderness, guarding or rebound.  Musculoskeletal:        General: No swelling or tenderness.     Cervical back: Neck supple.     Right lower leg: No edema.     Left lower leg: No edema.  Skin:    General: Skin is warm and dry.     Capillary Refill: Capillary refill takes less than 2 seconds.     Findings: No erythema or rash.  Neurological:     General: No focal deficit present.     Mental Status: He is alert.     Sensory: No sensory deficit.     Motor: No weakness.  Psychiatric:        Mood and Affect: Mood normal.     ED Results / Procedures / Treatments   Labs (all labs ordered are listed, but only abnormal results are displayed) Labs Reviewed  CBC WITH DIFFERENTIAL/PLATELET - Abnormal; Notable for the following components:      Result Value   WBC 21.7 (*)    Lymphs Abs 17.6 (*)    Monocytes Absolute 1.1 (*)    All other components within normal limits  COMPREHENSIVE METABOLIC PANEL - Abnormal; Notable for the following components:   Creatinine, Ser 1.29 (*)    Total Protein 5.9 (*)    Albumin 3.2 (*)    GFR, Estimated 54 (*)    All other components within normal limits  TSH - Abnormal; Notable for the following components:   TSH 4.543 (*)    All other components within normal limits  MAGNESIUM  TROPONIN I (HIGH SENSITIVITY)  TROPONIN I (HIGH SENSITIVITY)    EKG None  Radiology No results found.  Procedures Procedures    Medications Ordered in ED Medications - No data to display  ED Course/ Medical Decision Making/ A&P                                 Medical Decision Making Amount and/or Complexity of Data Reviewed Labs: ordered.    Francisco Larson is a 87 y.o. male with a past medical history significant for CLL, paroxysmal atrial fibrillation, hyperlipidemia, and CKD who presents from cardiac rehab for evaluation of bradycardia.  According to patient, he had an MI several months ago and had stents and has been doing  very well.  He reports that while cardiogram today, he was noted to have heart rate into the 30s with some pauses.  Patient reports he is not having symptoms and is denying any palpitations, shortness of breath, or lightheadedness.  He reports he did get tired yesterday while walking but otherwise he has been asymptomatic.  He specific denies any chest pain.  Denies any nausea, vomiting, diaphoresis, or other complaints.  Denies any recent fevers, chills, or congestion.  He reports a chronic cough for over a year that is not any different.  Denies leg pain or leg swelling.  Denies other complaints.  On exam, lungs clear.  Chest nontender.  Abdomen nontender.  Good pulses in extremities.  Legs are not tender or edematous.  Patient resting comfortably.  EKG shows A-fib with variable rate.  When I was watching on telemetry.  His heart rate did go down to about 37 but he was asymptomatic.  Will consult cardiology to have them see him and will get some screening labs.  Anticipate recommendation from cardiology to determine disposition.                   2:39 PM Cardiologist outpatient and feel he is safe for discharge home.  Will discharge for outpatient follow-up.        Final Clinical Impression(s) / ED Diagnoses Final diagnoses:  Bradycardia   Clinical Impression: 1. Bradycardia     Disposition: Discharge  Condition: Good  I have discussed the results, Dx and Tx plan with the pt(& family if present). He/she/they expressed understanding and agree(s) with the plan. Discharge instructions discussed at great length. Strict return precautions discussed and pt &/or family have verbalized understanding of the instructions. No further questions at time of discharge.    New Prescriptions   No medications on file    Follow Up: Etta Grandchild, MD [address] [place] [address] [phone]     [facility] Emergency Department at [facility] [address] [place] [address] [phone]         Winferd Wease, Canary Brim, MD 09/03/22 [phone]

## 2022-09-03 NOTE — ED Triage Notes (Signed)
Patient is coming from Cardiac Rehab at [facility] via EMS   HX:   NSTEMI JUNE with stent placement  3rd course of rehab today and was doing an precourse workup before starting exercises and was noted by nurse that he was.   Bradycardia with afib  38/-70  Asymptomac  No events in route  Taken off of carvedilol about 1 month ago   No pacemaker noted

## 2022-09-03 NOTE — Progress Notes (Signed)
Incomplete Session Note  Patient Details  Name: Francisco Larson MRN: [id number] Date of Birth: 10-17-1935 Referring Provider:   Flowsheet Row INTENSIVE CARDIAC REHAB ORIENT from 07/22/2022 in [facility]  Referring Provider Charlton Haws, MD       [name] did not complete his rehab session.  Patient noted to have frequent pauses and bradycardia rate in 30's to 40's noted during warm up at cardiac rehab this morning.Asymptomatic. Awake and alert. Oriented times 3.   Blood pressure 118/72.  Exercise stopped patient taken to treatment room placed on Zoll. P wave not visible. Onsite provider Robin Searing NP notified came and evaluated the patient. 12 lead ordered and obtained. Alden Server reviewed the 12 lead which showed atrial fib/junctional rhythm with slow ventricular response rate 40. Alden Server advised that [name] be taken to the ED for further evaluation. Patient is agreeable to the plan. Patient's wife called and notified about today's events and will meet the patient in the ED. Robin Searing NP gave report to EMS. EMS transported the patient to the ED for further evaluation.Gladstone Lighter, RN,BSN 09/03/2022 10:03 AM

## 2022-09-03 NOTE — Consult Note (Addendum)
Cardiology Consult   Patient ID: Francisco Larson MRN: [id number]; DOB: 10-23-1935   Admission date: 09/03/2022  PCP:  Etta Grandchild, MD   [facility] Providers Cardiologist:  Charlton Haws, MD        Chief Complaint:  bradycardia  Patient Profile:   Francisco Larson is a 87 y.o. male with history of CLL, PACs, atrial flutter s/p DCCV, CAD s/p DES to mid LAD, LCX, hyperlipidemia who is being seen 09/03/2022 for the evaluation of bradycardia.  History of Present Illness:   Francisco Larson was brought to the ED from cardiac rehab today for evaluation of bradycardia. While exercising today, patient was noted to have HR into the 30s with intermittent pauses. He denies any symptoms such as lightheadedness, dizziness, shortness of breath, fatigue.   Francisco Larson was seen initially in 2016 for complaint of PVCs by Dr. Eden Emms.  He had a known family history of CAD and underwent calcium scoring for risk stratification that showed score of 285.  He underwent ETT that showed no evidence of ischemia or ST elevation.  He was diagnosed with atrial flutter on 11/2019 and started on Eliquis and referred to EP.  He underwent DCCV with conversion to sinus rhythm.  He had ILR placed with no recurrence and Eliquis was discontinued 12/2019.  He was last seen by Dr. Eden Emms 11/2021 doing well with no new cardiac complaints.  He presented to the ED 06/26/2022 with complaint of chest pain elevations in troponin with no ischemic changes in EKG.  NSTEMI was diagnosed patient was started on heparin drip and underwent LHC that showed 80% stenosed lesion in mid LAD and 90% lesion in proximal circumflex that were treated with DES.  He was started on Plavix and aspirin and referred to cardiac rehab.  During patient's initial orientation he was found to have junctional rhythm with rate in the 40s and 50s.  Rhythm strips were reviewed by onsite provider Carlos Levering, NP and twelve-lead EKG was completed  showing sinus with first-degree AVB and patient was instructed to hold Coreg and a 3-day monitor was ordered to evaluate for heart block.  He was advised to refrain from rehab until follow-up visit.  3-day ZIO monitor results revealed predominantly sinus rhythm with first-degree AVB present and run run of tachycardia that was not sustained and 1 pause occurring lasting 3 seconds.   Past Medical History:  Diagnosis Date   Arthritis    Coronary artery disease    Hyperlipidemia    Leukemia, chronic lymphocytic (HCC)    SCCA (squamous cell carcinoma) of skin 08/08/2020   Mid Frontal Scalp (in situ) (curet and 5FU)   SCCA (squamous cell carcinoma) of skin 08/08/2020   Mid Parietal Scalp (in situ) (curet and 5FU)   SCCA (squamous cell carcinoma) of skin 08/08/2020   Mid Supratip of Nose (in situ)   Shingles    Squamous cell carcinoma of skin 12/08/2018   right jaw line cis   Vitamin D deficiency     Past Surgical History:  Procedure Laterality Date   CARDIAC CATHETERIZATION     CARDIOVERSION N/A 12/16/2019   Procedure: CARDIOVERSION;  Surgeon: Thurmon Fair, MD;  Location: [facility];  Service: Cardiovascular;  Laterality: N/A;   CORONARY STENT INTERVENTION N/A 06/27/2022   Procedure: CORONARY STENT INTERVENTION;  Surgeon: Marykay Lex, MD;  Location: [facility] INVASIVE CV LAB;  Service: Cardiovascular;  Laterality: N/A;   EYE SURGERY     LAPAROSCOPIC TRANS ANAL AND TRANSABDOMINAL RECTAL  RESECTION WITH COLOANAL ANASTOMOSIS     LEFT HEART CATH AND CORONARY ANGIOGRAPHY N/A 06/27/2022   Procedure: LEFT HEART CATH AND CORONARY ANGIOGRAPHY;  Surgeon: Marykay Lex, MD;  Location: [facility] INVASIVE CV LAB;  Service: Cardiovascular;  Laterality: N/A;   SURGERY OF LIP     tumor   TONSILLECTOMY     VASECTOMY       Medications Prior to Admission: Prior to Admission medications   Medication Sig Start Date End Date Taking? Authorizing Provider  amLODipine (NORVASC) 5 MG tablet Take 1 tablet (5 mg  total) by mouth daily. 06/29/22   Meredeth Ide, MD  aspirin EC (ASPIRIN LOW DOSE) 81 MG tablet TAKE 1 TABLET (81 MG TOTAL) BY MOUTH DAILY. SWALLOW WHOLE. 07/28/22   Wendall Stade, MD  atorvastatin (LIPITOR) 80 MG tablet Take 1 tablet (80 mg total) by mouth daily. 06/29/22   Meredeth Ide, MD  carvedilol (COREG) 3.125 MG tablet Take 1 tablet (3.125 mg total) by mouth 2 (two) times daily with a meal. Patient not taking: Reported on 08/12/2022 06/28/22   Meredeth Ide, MD  clopidogrel (PLAVIX) 75 MG tablet TAKE 1 TABLET BY MOUTH EVERY DAY 07/15/22   Sharlene Dory, PA-C  Multiple Vitamins-Minerals (CENTRUM SILVER PO) Take 1 tablet by mouth daily with breakfast.    [provider]  nitroGLYCERIN (NITROSTAT) 0.4 MG SL tablet Place 1 tablet (0.4 mg total) under the tongue every 5 (five) minutes as needed for chest pain. 07/07/22   Sharlene Dory, PA-C  TYLENOL 325 MG tablet Take 325-650 mg by mouth every 6 (six) hours as needed for mild pain or headache.    [provider]     Allergies:    Allergies  Allergen Reactions   Azithromycin Itching and Other (See Comments)    Pt stated he thinks this made him dizzy, also    Social History:   Social History   Socioeconomic History   Marital status: Married    Spouse name: Not on file   Number of children: 2   Years of education: 18   Highest education level: Professional school degree (e.g., MD, DDS, DVM, JD)  Occupational History   Occupation: Retired    Comment: Professor [employer]  Tobacco Use   Smoking status: Never   Smokeless tobacco: Never  Vaping Use   Vaping status: Never Used  Substance and Sexual Activity   Alcohol use: No   Drug use: No   Sexual activity: Not on file  Other Topics Concern   Not on file  Social History Narrative   Not on file   Social Determinants of Health   Financial Resource Strain: Low Risk  (08/12/2022)   Overall Financial Resource Strain (CARDIA)    Difficulty of Paying Living Expenses: Not  hard at all  Food Insecurity: No Food Insecurity (08/12/2022)   Hunger Vital Sign    Worried About Running Out of Food in the Last Year: Never true    Ran Out of Food in the Last Year: Never true  Transportation Needs: No Transportation Needs (08/12/2022)   PRAPARE - Administrator, Civil Service (Medical): No    Lack of Transportation (Non-Medical): No  Physical Activity: Sufficiently Active (08/12/2022)   Exercise Vital Sign    Days of Exercise per Week: 6 days    Minutes of Exercise per Session: 60 min  Stress: No Stress Concern Present (08/12/2022)   [employer] of Occupational Health - Occupational Stress  Questionnaire    Feeling of Stress : Only a little  Social Connections: Unknown (08/12/2022)   Social Connection and Isolation Panel [NHANES]    Frequency of Communication with Friends and Family: More than three times a week    Frequency of Social Gatherings with Friends and Family: Twice a week    Attends Religious Services: Not on Marketing executive or Organizations: Yes    Attends Banker Meetings: More than 4 times per year    Marital Status: Married  Catering manager Violence: Not At Risk (08/12/2022)   Humiliation, Afraid, Rape, and Kick questionnaire    Fear of Current or Ex-Partner: No    Emotionally Abused: No    Physically Abused: No    Sexually Abused: No    Family History:   The patient's family history includes Asthma in his sister; COPD in his maternal grandmother; Cancer in his mother; Colon cancer in his sister; Diabetes in his paternal grandmother; Emphysema in his mother; Heart disease in his father; Hyperlipidemia in his sister; Mental illness in his paternal grandmother; Stroke in his maternal grandfather and sister.    ROS:  Please see the history of present illness.  All other ROS reviewed and negative.     Physical Exam/Data:   Vitals:   09/03/22 1230 09/03/22 1245 09/03/22 1300 09/03/22 1315  BP: (!) 156/77  (!) 155/80 (!) 155/73 (!) 151/72  Pulse: 63 63 (!) 56 (!) 49  Resp: 18 17 10 17   Temp:      TempSrc:      SpO2: 99% 98% 99% 99%   No intake or output data in the 24 hours ending 09/03/22 1323    08/15/2022    1:43 PM 08/12/2022    8:16 AM 07/22/2022   10:25 AM  Last 3 Weights  Weight (lbs) 207 lb 9.6 oz 205 lb 206 lb 12.7 oz  Weight (kg) 94.167 kg 92.987 kg 93.8 kg     There is no height or weight on file to calculate BMI.  General:  Well nourished, well developed, in no acute distress HEENT: normal Neck: no JVD Vascular: No carotid bruits; Distal pulses 2+ bilaterally   Cardiac:  normal S1, S2; RRR; no murmur  Lungs:  clear to auscultation bilaterally, no wheezing, rhonchi or rales  Abd: soft, nontender, no hepatomegaly  Ext: no edema Musculoskeletal:  No deformities, BUE and BLE strength normal and equal Skin: warm and dry  Neuro:  CNs 2-12 intact, no focal abnormalities noted Psych:  Normal affect    EKG:  The ECG that was done at cardiac rehab today was personally reviewed and demonstrates intermittent sinus pause with first degree AVB. No clear high grade AVB. Telemetry: sinus rhythm with first degree AVB and frequent PVCs  Relevant CV Studies:  08/05/22 3 day heart monitor  atient had a min HR of 24 bpm, max HR of 164 bpm, and avg HR of 60 bpm. Predominant underlying rhythm was Sinus Rhythm. First Degree AV Block was present. 1 run of Ventricular Tachycardia occurred lasting 7 beats with a max rate of 164 bpm (avg 122  bpm). 5 Supraventricular Tachycardia runs occurred, the run with the fastest interval lasting 5 beats with a max rate of 158 bpm, the longest lasting 7 beats with an avg rate of 112 bpm. 1 Pause occurred lasting 3 secs (20 bpm). Second Degree AV  Block-Mobitz I (Wenckebach) was present. Isolated SVEs were occasional (1.8%, 5062), SVE Couplets were  rare (<1.0%, 636), and SVE Triplets were rare (<1.0%, 34). Isolated VEs were occasional (1.1%, 3057), VE Couplets  were rare (<1.0%, 114), and no VE  Triplets were present. Ventricular Bigeminy was present.  06/27/22 TTE  IMPRESSIONS     1. Left ventricular ejection fraction, by estimation, is 60 to 65%. The  left ventricle has normal function. The left ventricle has no regional  wall motion abnormalities. There is mild asymmetric left ventricular  hypertrophy of the basal and septal  segments. Left ventricular diastolic parameters were normal.   2. Right ventricular systolic function is normal. The right ventricular  size is normal.   3. Left atrial size was moderately dilated.   4. The mitral valve is abnormal. Trivial mitral valve regurgitation. No  evidence of mitral stenosis.   5. Partial fusion of right and left cusps Likely moderate AS by AVA  calculation despite DVI of only 0.44 and mean gradient 13.7 mmHg . The  aortic valve is normal in structure. There is moderate calcification of  the aortic valve. There is moderate  thickening of the aortic valve. Aortic valve regurgitation is not  visualized. Moderate aortic valve stenosis.   6. The inferior vena cava is normal in size with greater than 50%  respiratory variability, suggesting right atrial pressure of 3 mmHg.   FINDINGS   Left Ventricle: Left ventricular ejection fraction, by estimation, is 60  to 65%. The left ventricle has normal function. The left ventricle has no  regional wall motion abnormalities. The left ventricular internal cavity  size was normal in size. There is   mild asymmetric left ventricular hypertrophy of the basal and septal  segments. Left ventricular diastolic parameters were normal.   Right Ventricle: The right ventricular size is normal. No increase in  right ventricular wall thickness. Right ventricular systolic function is  normal.   Left Atrium: Left atrial size was moderately dilated.   Right Atrium: Right atrial size was normal in size.   Pericardium: There is no evidence of pericardial  effusion.   Mitral Valve: The mitral valve is abnormal. There is mild thickening of  the mitral valve leaflet(s). There is mild calcification of the mitral  valve leaflet(s). Mild mitral annular calcification. Trivial mitral valve  regurgitation. No evidence of mitral  valve stenosis. MV peak gradient, 3.5 mmHg. The mean mitral valve gradient  is 2.0 mmHg.   Tricuspid Valve: The tricuspid valve is normal in structure. Tricuspid  valve regurgitation is mild . No evidence of tricuspid stenosis.   Aortic Valve: Partial fusion of right and left cusps Likely moderate AS by  AVA calculation despite DVI of only 0.44 and mean gradient 13.7 mmHg. The  aortic valve is normal in structure. There is moderate calcification of  the aortic valve. There is  moderate thickening of the aortic valve. Aortic valve regurgitation is not  visualized. Moderate aortic stenosis is present. Aortic valve mean  gradient measures 13.7 mmHg. Aortic valve peak gradient measures 25.3  mmHg. Aortic valve area, by VTI measures  0.99 cm.   Pulmonic Valve: The pulmonic valve was normal in structure. Pulmonic valve  regurgitation is not visualized. No evidence of pulmonic stenosis.   Aorta: The aortic root is normal in size and structure.   Venous: The inferior vena cava is normal in size with greater than 50%  respiratory variability, suggesting right atrial pressure of 3 mmHg.   IAS/Shunts: No atrial level shunt detected by color flow Doppler.   06/27/22 LHC  1st Diag lesion is 30% stenosed.   LESION #1: Mid LAD lesion is 80% stenosed.   A drug-eluting stent was successfully placed using a SYNERGY XD 2.50X16 => postdilated to 2.75 mm.  Post intervention, there is a 0% residual stenosis.  TIMI-3 flow pre and post   CULPRIT LESION (#2): Prox Cx lesion is 90% stenosed.   A drug-eluting stent was successfully placed using a SYNERGY XD 2.75X12 => postdilated to 3.1 mm. Post intervention, there is a 0% residual  stenosis. TIMI-3 flow pre and post   POST-OPERATIVE DIAGNOSIS:   Severe two-vessel disease status post two-vessel PCI Mid LAD 80% after Large 1st Diag (that has prox 30%); mid- distal LAD tapers down to a relatively small caliber vessel and gives off septal perforators but no other diagonal branches. DES PCI: Synergy XD 2.5 mm x 16 mm, postdilated to 2.7 mm Mid LCx (after AV groove branch) 90% stenosis, focal -> LCx courses to be, large 2nd Mrg branch giving off a third marginal branch as well. DES PCI: Synergy XD 2.75 mm x 12 mm, postdilated to 3.1 mm. Large-caliber Ramus that gives off lateral branch; normal Very large caliber Dominant RCA with minimal disease.  Bifurcates distally into large wraparound PDA (with patchy 30 to 40% stenoses) and 1 major PL branch relatively free of disease..  Laboratory Data:  High Sensitivity Troponin:   Recent Labs  Lab 09/03/22 1019  TROPONINIHS 12      Chemistry Recent Labs  Lab 09/03/22 1019  NA 137  K 4.4  CL 101  CO2 27  GLUCOSE 96  BUN 14  CREATININE 1.29*  CALCIUM 9.1  MG 2.2  GFRNONAA 54*  ANIONGAP 9    Recent Labs  Lab 09/03/22 1019  PROT 5.9*  ALBUMIN 3.2*  AST 28  ALT 28  ALKPHOS 85  BILITOT 0.7   Lipids No results for input(s): "CHOL", "TRIG", "HDL", "LABVLDL", "LDLCALC", "CHOLHDL" in the last 168 hours. Hematology Recent Labs  Lab 09/03/22 1019  WBC 21.7*  RBC 5.03  HGB 15.5  HCT 46.6  MCV 92.6  MCH 30.8  MCHC 33.3  RDW 13.4  PLT 162   Thyroid  Recent Labs  Lab 09/03/22 1019  TSH 4.543*   BNPNo results for input(s): "BNP", "PROBNP" in the last 168 hours.  DDimer No results for input(s): "DDIMER" in the last 168 hours.   Radiology/Studies:  No results found.   Assessment and Plan:   Asymptomatic bradycardia Hx afib/flutter  Patient with history of DCCV in the setting of atrial flutter in December of 2021. Subsequently had ILR placed which has not revealed recurrent afib or flutter. At  patient's cardiac rehab orientation, he was noted with bradycardia into the 40s/50s, reported as junctional bradycardia. Patient's coreg held and a 3 day monitor placed which revealed sinus rhythm with first degree AVB. Today at cardiac rehab, patient again noted with bradycardia and ECG with ~2.2 second sinus pause, prompting transport to the ED.   Although ECG read as "high grade AVB", patient's rhythm at rehab appears to have been just sinus rhythm with first degree AVB and sinus pause. Telemetry in the ED shows mild bradycardia with moderately frequently PVCs and I suspect that patient's low HR at rehab was in part a reflection of these PVCs. Interrogation of patient's ILR today does not reveal afib or bradycardia <30 bpm. No indication of recurrent afib. Given lack of symptoms and reassuring lab workup, no reason to admit patient from cardiac perspective. Continue to  avoid beta blockers given slower resting HR. It is possible that patient will progress to needing a pacemaker in the future but for now, no indication. Discussed warning signs such as near-syncope/syncope, dizziness, severe fatigue.  Continue with cardiac rehab  CAD Hyperlipidemia  Patient s/p NSTEMI in June of 2024 with PCI to mid LAD and LCX. Continue lipitor, plavix, ASA.   Risk Assessment/Risk Scores:      For questions or updates, please contact [facility] Please consult [email or web address] for contact info under     Signed, Perlie Gold, PA-C  09/03/2022 1:23 PM    I have seen and examined the patient along with Perlie Gold, PA-C .  I have reviewed the chart, notes and new data.  I agree with PA/NP's note.  Key new complaints: He is completely asymptomatic.  Denies syncope/presyncope, fatigue, shortness of breath.  He is a lifelong runner and habitually his heart rate have always been in the 50s.  He has not had any angina since his stent procedure in mid June. Key examination changes: His heart rate was  deemed to be very slow based on automatic blood pressure cuff reading.  This likely will underestimate his true heart rate, since he has frequent arrhythmia.  He has a harsh systolic murmur Key new findings / data: Reviewed his ECGs and downloaded his loop recorder.  There is evidence of persistent sinus bradycardia and sinus arrhythmia in a pattern that is suggestive of sinoatrial block, second-degree Mobitz type I.  There is no evidence of atrioventricular block.  PLAN: Asymptomatic sinus node dysfunction with sinus bradycardia (partially explained by high vagal tone) and second-degree sinoatrial block, Mobitz type I. Beta-blockers have been appropriately stopped.  Avoid medications with negative chronotropic effect. No indication for pacemaker since he is completely asymptomatic. Okay to resume cardiac rehab without restrictions. Continue to monitor for significant bradyarrhythmia via his implanted loop recorder.  This will alert [place] to any serious sinus pauses or severe sinus bradycardia.  He has excellent signal on the loop recorder .  Thurmon Fair, MD, [facility] [phone] 09/03/2022, 2:43 PM

## 2022-09-05 ENCOUNTER — Encounter (HOSPITAL_COMMUNITY)
Admission: RE | Admit: 2022-09-05 | Discharge: 2022-09-05 | Disposition: A | Discharge status: Home / Self Care | Payer: Medicare PPO | Source: Ambulatory Visit | Attending: Cardiovascular Disease | Admitting: Cardiovascular Disease

## 2022-09-05 DIAGNOSIS — I214 Non-ST elevation (NSTEMI) myocardial infarction: Secondary | ICD-10-CM | POA: Diagnosis not present

## 2022-09-05 DIAGNOSIS — Z955 Presence of coronary angioplasty implant and graft: Secondary | ICD-10-CM | POA: Diagnosis not present

## 2022-09-05 DIAGNOSIS — Z48812 Encounter for surgical aftercare following surgery on the circulatory system: Secondary | ICD-10-CM | POA: Diagnosis not present

## 2022-09-05 DIAGNOSIS — R001 Bradycardia, unspecified: Secondary | ICD-10-CM | POA: Diagnosis not present

## 2022-09-05 NOTE — Progress Notes (Signed)
Francisco Larson returned to exercise per Dr Royann Shivers and exercised without difficulty.[name] RN BSN

## 2022-09-08 ENCOUNTER — Encounter (HOSPITAL_COMMUNITY)
Admission: RE | Admit: 2022-09-08 | Discharge: 2022-09-08 | Disposition: A | Discharge status: Home / Self Care | Payer: Medicare PPO | Source: Ambulatory Visit | Attending: Cardiovascular Disease | Admitting: Cardiovascular Disease

## 2022-09-08 DIAGNOSIS — Z955 Presence of coronary angioplasty implant and graft: Secondary | ICD-10-CM | POA: Diagnosis not present

## 2022-09-08 DIAGNOSIS — I214 Non-ST elevation (NSTEMI) myocardial infarction: Secondary | ICD-10-CM

## 2022-09-08 DIAGNOSIS — R001 Bradycardia, unspecified: Secondary | ICD-10-CM | POA: Diagnosis not present

## 2022-09-08 DIAGNOSIS — Z48812 Encounter for surgical aftercare following surgery on the circulatory system: Secondary | ICD-10-CM | POA: Diagnosis not present

## 2022-09-09 ENCOUNTER — Other Ambulatory Visit: Payer: Self-pay | Admitting: Medical Genetics

## 2022-09-09 DIAGNOSIS — Z006 Encounter for examination for normal comparison and control in clinical research program: Secondary | ICD-10-CM

## 2022-09-09 NOTE — Progress Notes (Signed)
Cardiac Individual Treatment Plan  Patient Details  Name: Francisco Larson MRN: [id number] Date of Birth: [birth year] Referring Provider:   Flowsheet Row INTENSIVE CARDIAC REHAB ORIENT from 07/22/2022 in [facility]  Referring Provider Charlton Haws, MD       Initial Encounter Date:  Flowsheet Row INTENSIVE CARDIAC REHAB ORIENT from 07/22/2022 in [facility]  Date 07/22/22       Visit Diagnosis: 06/26/22 NSTEMI (non-ST elevated myocardial infarction) (HCC)  06/27/22 Status post coronary artery stent placement  Patient's Home Medications on Admission:  Current Outpatient Medications:    amLODipine (NORVASC) 5 MG tablet, Take 1 tablet (5 mg total) by mouth daily., Disp: 30 tablet, Rfl: 3   aspirin EC (ASPIRIN LOW DOSE) 81 MG tablet, TAKE 1 TABLET (81 MG TOTAL) BY MOUTH DAILY. SWALLOW WHOLE., Disp: 30 tablet, Rfl: 10   atorvastatin (LIPITOR) 80 MG tablet, Take 1 tablet (80 mg total) by mouth daily., Disp: 30 tablet, Rfl: 2   carvedilol (COREG) 3.125 MG tablet, Take 1 tablet (3.125 mg total) by mouth 2 (two) times daily with a meal. (Patient not taking: Reported on 08/12/2022), Disp: 60 tablet, Rfl: 2   clopidogrel (PLAVIX) 75 MG tablet, TAKE 1 TABLET BY MOUTH EVERY DAY, Disp: 90 tablet, Rfl: 3   Multiple Vitamins-Minerals (CENTRUM SILVER PO), Take 1 tablet by mouth daily with breakfast., Disp: , Rfl:    nitroGLYCERIN (NITROSTAT) 0.4 MG SL tablet, Place 1 tablet (0.4 mg total) under the tongue every 5 (five) minutes as needed for chest pain., Disp: 25 tablet, Rfl: 3   TYLENOL 325 MG tablet, Take 325-650 mg by mouth every 6 (six) hours as needed for mild pain or headache., Disp: , Rfl:   Past Medical History: Past Medical History:  Diagnosis Date   Arthritis    Coronary artery disease    Hyperlipidemia    Leukemia, chronic lymphocytic (HCC)    SCCA (squamous cell  carcinoma) of skin 08/08/2020   Mid Frontal Scalp (in situ) (curet and 5FU)   SCCA (squamous cell carcinoma) of skin 08/08/2020   Mid Parietal Scalp (in situ) (curet and 5FU)   SCCA (squamous cell carcinoma) of skin 08/08/2020   Mid Supratip of Nose (in situ)   Shingles    Squamous cell carcinoma of skin 12/08/2018   right jaw line cis   Vitamin D deficiency     Tobacco Use: Social History   Tobacco Use  Smoking Status Never  Smokeless Tobacco Never    Labs: Review Flowsheet  More data exists      Latest Ref Rng & Units 04/01/2021 12/02/2021 04/09/2022 06/26/2022 06/27/2022  Labs for ITP Cardiac and Pulmonary Rehab  Cholestrol 0 - 200 mg/dL 875  643  - - 329   LDL (calc) 0 - 99 mg/dL 76  88  - - 76   HDL-C >40 mg/dL 51.88  32  - - 32   Trlycerides <150 mg/dL 416.6  063  - - 71   Hemoglobin A1c 4.8 - 5.6 % 6.1  - 6.1  5.7  -    Details            Capillary Blood Glucose: No results found for: "GLUCAP"   Exercise Target Goals: Exercise Program Goal: Individual exercise prescription set using results from initial 6 min walk test and THRR while considering  patient's activity barriers and safety.   Exercise Prescription Goal:  Initial exercise prescription builds to 30-45 minutes a day of aerobic activity, 2-3 days per week.  Home exercise guidelines will be given to patient during program as part of exercise prescription that the participant will acknowledge.  Activity Barriers & Risk Stratification:  Activity Barriers & Cardiac Risk Stratification - 07/22/22 1027       Activity Barriers & Cardiac Risk Stratification   Activity Barriers Arthritis;Joint Problems;History of Falls;Deconditioning;Balance Concerns    Cardiac Risk Stratification High             6 Minute Walk:  6 Minute Walk     Row Name 07/22/22 1155         6 Minute Walk   Phase Initial     Distance 1353 feet     Walk Time 6 minutes     # of Rest Breaks 0     MPH 2.6     METS 1.9      RPE 9     Perceived Dyspnea  0     VO2 Peak 6.6     Symptoms Yes (comment)     Comments Mild lightheadedness on last lap     Resting HR 52 bpm     Resting BP 108/60     Resting Oxygen Saturation  98 %     Exercise Oxygen Saturation  during 6 min walk 98 %     Max Ex. HR 64 bpm     Max Ex. BP 130/64     2 Minute Post BP 112/70              Oxygen Initial Assessment:   Oxygen Re-Evaluation:   Oxygen Discharge (Final Oxygen Re-Evaluation):   Initial Exercise Prescription:  Initial Exercise Prescription - 07/22/22 1100       Date of Initial Exercise RX and Referring Provider   Date 07/22/22    Referring Provider Charlton Haws, MD    Expected Discharge Date 10/15/22      Recumbant Bike   Level 1    RPM 60    Watts 20    Minutes 15    METs 1.9      NuStep   Level 1    SPM 75    Minutes 15    METs 1.9      Prescription Details   Frequency (times per week) 3    Duration Progress to 30 minutes of continuous aerobic without signs/symptoms of physical distress      Intensity   THRR 40-80% of Max Heartrate 54-109    Ratings of Perceived Exertion 11-13    Perceived Dyspnea 0-4      Progression   Progression Continue progressive overload as per policy without signs/symptoms or physical distress.      Resistance Training   Training Prescription Yes    Weight 3 lbs    Reps 10-15             Perform Capillary Blood Glucose checks as needed.  Exercise Prescription Changes:   Exercise Prescription Changes     Row Name 08/18/22 1600 08/29/22 1600 09/01/22 1600         Response to Exercise   Blood Pressure (Admit) 120/80 112/58 130/72     Blood Pressure (Exercise) 134/70 132/70 130/62     Blood Pressure (Exit) 106/70 114/70 100/62     Heart Rate (Admit) 60 bpm 57 bpm --     Heart Rate (Exercise) 73 bpm 100 bpm 83 bpm  Heart Rate (Exit) 61 bpm 67 bpm 56 bpm     Rating of Perceived Exertion (Exercise) 11 13 11      Symptoms None None None      Comments Pt's firsst day in the CRP2 program Reviewed METs with patient Reviewed home exercise Rx     Duration Continue with 30 min of aerobic exercise without signs/symptoms of physical distress. Continue with 30 min of aerobic exercise without signs/symptoms of physical distress. Continue with 30 min of aerobic exercise without signs/symptoms of physical distress.     Intensity THRR unchanged THRR unchanged THRR unchanged       Progression   Progression Continue to progress workloads to maintain intensity without signs/symptoms of physical distress. Continue to progress workloads to maintain intensity without signs/symptoms of physical distress. Continue to progress workloads to maintain intensity without signs/symptoms of physical distress.     Average METs 2.3 2.65 2.55       Resistance Training   Training Prescription Yes Yes Yes     Weight 3 lbs 3 lbs 3 lbs     Reps 10-15 10-15 10-15     Time 10 Minutes 10 Minutes 10 Minutes       Interval Training   Interval Training No No No       Recumbant Bike   Level 1 2 2      RPM 67 78 71     Watts 18 29 28      Minutes 15 15 15      METs 2.1 2.6 2.4       NuStep   Level 1 2 2      SPM 91 96 99     Minutes 15 15 15      METs 2.5 2.7 2.7       Home Exercise Plan   Plans to continue exercise at -- -- Home (comment)     Frequency -- -- Add 3 additional days to program exercise sessions.     Initial Home Exercises Provided -- -- 09/01/22              Exercise Comments:   Exercise Comments     Row Name 08/18/22 1607 08/29/22 1648 09/01/22 1658       Exercise Comments Pt's first day in the CRP2 program. Pt exercised without complaints and is off to a good start. Reviewed METs today. Progressing well. Pt agreeable to increase on both modalities today. Reviewed home exercise Rx with patient today. Pt has been waiting to resume walking. We have been giving the patient a few weeks to exercise here in the CRP2  before resuming walking at  home. Pt denies anymore episodes of lightheadedness and has been asymptomatic here CR. Pt will begin by walking 10 minutes 2x/day and slowly add time until is back to 20 minutes 2x/day. Pt verbalized understanding of the home exercise Rx and was provided a copy.              Exercise Goals and Review:   Exercise Goals     Row Name 07/22/22 0921             Exercise Goals   Increase Physical Activity Yes       Intervention Provide advice, education, support and counseling about physical activity/exercise needs.;Develop an individualized exercise prescription for aerobic and resistive training based on initial evaluation findings, risk stratification, comorbidities and participant's personal goals.       Expected Outcomes Short Term: Attend rehab on a regular basis to  increase amount of physical activity.;Long Term: Exercising regularly at least 3-5 days a week.;Long Term: Add in home exercise to make exercise part of routine and to increase amount of physical activity.       Increase Strength and Stamina Yes       Intervention Provide advice, education, support and counseling about physical activity/exercise needs.;Develop an individualized exercise prescription for aerobic and resistive training based on initial evaluation findings, risk stratification, comorbidities and participant's personal goals.       Expected Outcomes Short Term: Increase workloads from initial exercise prescription for resistance, speed, and METs.;Short Term: Perform resistance training exercises routinely during rehab and add in resistance training at home;Long Term: Improve cardiorespiratory fitness, muscular endurance and strength as measured by increased METs and functional capacity ( )       Able to understand and use rate of perceived exertion (RPE) scale Yes       Intervention Provide education and explanation on how to use RPE scale       Expected Outcomes Short Term: Able to use RPE daily in rehab to  express subjective intensity level;Long Term:  Able to use RPE to guide intensity level when exercising independently       Knowledge and understanding of Target Heart Rate Range (THRR) Yes       Intervention Provide education and explanation of THRR including how the numbers were predicted and where they are located for reference       Expected Outcomes Short Term: Able to state/look up THRR;Long Term: Able to use THRR to govern intensity when exercising independently;Short Term: Able to use daily as guideline for intensity in rehab       Understanding of Exercise Prescription Yes       Intervention Provide education, explanation, and written materials on patient's individual exercise prescription       Expected Outcomes Short Term: Able to explain program exercise prescription;Long Term: Able to explain home exercise prescription to exercise independently                Exercise Goals Re-Evaluation :  Exercise Goals Re-Evaluation     Row Name 08/18/22 1603             Exercise Goal Re-Evaluation   Exercise Goals Review Increase Physical Activity;Understanding of Exercise Prescription;Increase Strength and Stamina;Knowledge and understanding of Target Heart Rate Range (THRR);Able to understand and use rate of perceived exertion (RPE) scale       Comments Pt's first day in the CRP2 program. Pt understands the exercise Rx, RPE scale and THRR.       Expected Outcomes Will continue to monitor patient and progress exercise workloads as tolerated.                Discharge Exercise Prescription (Final Exercise Prescription Changes):  Exercise Prescription Changes - 09/01/22 1600       Response to Exercise   Blood Pressure (Admit) 130/72    Blood Pressure (Exercise) 130/62    Blood Pressure (Exit) 100/62    Heart Rate (Exercise) 83 bpm    Heart Rate (Exit) 56 bpm    Rating of Perceived Exertion (Exercise) 11    Symptoms None    Comments Reviewed home exercise Rx    Duration  Continue with 30 min of aerobic exercise without signs/symptoms of physical distress.    Intensity THRR unchanged      Progression   Progression Continue to progress workloads to maintain intensity without signs/symptoms of physical distress.  Average METs 2.55      Resistance Training   Training Prescription Yes    Weight 3 lbs    Reps 10-15    Time 10 Minutes      Interval Training   Interval Training No      Recumbant Bike   Level 2    RPM 71    Watts 28    Minutes 15    METs 2.4      NuStep   Level 2    SPM 99    Minutes 15    METs 2.7      Home Exercise Plan   Plans to continue exercise at Home (comment)    Frequency Add 3 additional days to program exercise sessions.    Initial Home Exercises Provided 09/01/22             Nutrition:  Target Goals: Understanding of nutrition guidelines, daily intake of sodium 1500mg , cholesterol 200mg , calories 30% from fat and 7% or less from saturated fats, daily to have 5 or more servings of fruits and vegetables.  Biometrics:  Pre Biometrics - 07/22/22 1025       Pre Biometrics   Waist Circumference 42 inches    Hip Circumference 42.5 inches    Waist to Hip Ratio 0.99 %    Triceps Skinfold 13 mm    % Body Fat 28.4 %    Grip Strength 31 kg    Flexibility 0 in   Could not reach   Single Leg Stand 3 seconds              Nutrition Therapy Plan and Nutrition Goals:  Nutrition Therapy & Goals - 08/18/22 1000       Nutrition Therapy   Diet Heart Healthy Diet    Drug/Food Interactions Statins/Certain Fruits      Personal Nutrition Goals   Nutrition Goal Patient to identify strategies for reducing cardiovascular risk by attending the Pritikin education and nutrition series weekly.    Personal Goal #2 Patient to improve diet quality by using the plate method as a guide for meal planning to include lean protein/plant protein, fruits, vegetables, whole grains, nonfat dairy as part of a well-balanced diet.     Personal Goal #3 Patient to reduce sodium intake to 1500mg  per day    Comments Patient will benefit from participation in intensive cardiac rehab for nutrition, exercise, and lifestyle modification.      Intervention Plan   Intervention Prescribe, educate and counsel regarding individualized specific dietary modifications aiming towards targeted core components such as weight, hypertension, lipid management, diabetes, heart failure and other comorbidities.;Nutrition handout(s) given to patient.    Expected Outcomes Short Term Goal: Understand basic principles of dietary content, such as calories, fat, sodium, cholesterol and nutrients.;Long Term Goal: Adherence to prescribed nutrition plan.             Nutrition Assessments:  Nutrition Assessments - 09/02/22 0915       Rate Your Plate Scores   Pre Score 46            MEDIFICTS Score Key: ?70 Need to make dietary changes  40-70 Heart Healthy Diet ? 40 Therapeutic Level Cholesterol Diet   Flowsheet Row INTENSIVE CARDIAC REHAB from 09/01/2022 in [facility] for Heart, Vascular, & Lung Health  Picture Your Plate Total Score on Admission 46      Picture Your Plate Scores: <09 Unhealthy dietary pattern with much room for improvement. 41-50 Dietary  pattern unlikely to meet recommendations for good health and room for improvement. 51-60 More healthful dietary pattern, with some room for improvement.  >60 Healthy dietary pattern, although there may be some specific behaviors that could be improved.    Nutrition Goals Re-Evaluation:  Nutrition Goals Re-Evaluation     Row Name 08/18/22 1000             Goals   Current Weight 209 lb 7 oz (95 kg)       Comment lipoproteinA WNL, HDL 32, LDL 76, A1c 5.7       Expected Outcome Patient will benefit from participation in intensive cardiac rehab for nutrition, exercise, and lifestyle modification.                Nutrition Goals Re-Evaluation:   Nutrition Goals Re-Evaluation     Row Name 08/18/22 1000             Goals   Current Weight 209 lb 7 oz (95 kg)       Comment lipoproteinA WNL, HDL 32, LDL 76, A1c 5.7       Expected Outcome Patient will benefit from participation in intensive cardiac rehab for nutrition, exercise, and lifestyle modification.                Nutrition Goals Discharge (Final Nutrition Goals Re-Evaluation):  Nutrition Goals Re-Evaluation - 08/18/22 1000       Goals   Current Weight 209 lb 7 oz (95 kg)    Comment lipoproteinA WNL, HDL 32, LDL 76, A1c 5.7    Expected Outcome Patient will benefit from participation in intensive cardiac rehab for nutrition, exercise, and lifestyle modification.             Psychosocial: Target Goals: Acknowledge presence or absence of significant depression and/or stress, maximize coping skills, provide positive support system. Participant is able to verbalize types and ability to use techniques and skills needed for reducing stress and depression.  Initial Review & Psychosocial Screening:  Initial Psych Review & Screening - 07/22/22 1027       Initial Review   Current issues with None Identified      Family Dynamics   Good Support System? --   [name] has his wife for support     Barriers   Psychosocial barriers to participate in program There are no identifiable barriers or psychosocial needs.      Screening Interventions   Interventions Encouraged to exercise             Quality of Life Scores:  Quality of Life - 07/22/22 1212       Quality of Life   Select Quality of Life      Quality of Life Scores   Health/Function Pre 26.04 %    Socioeconomic Pre 28.44 %    Psych/Spiritual Pre 28.79 %    Family Pre 30 %    GLOBAL Pre 27.75 %            Scores of 19 and below usually indicate a poorer quality of life in these areas.  A difference of  2-3 points is a clinically meaningful difference.  A difference of 2-3 points in the total  score of the Quality of Life Index has been associated with significant improvement in overall quality of life, self-image, physical symptoms, and general health in studies assessing change in quality of life.  PHQ-9: Review Flowsheet  More data exists      08/12/2022 07/22/2022 07/22/2021 04/01/2021  03/28/2020  Depression screen PHQ 2/9  Decreased Interest 0 0 0 0 0  Down, Depressed, Hopeless 0 0 0 0 0  PHQ - 2 Score 0 0 0 0 0  Altered sleeping 0 0 - - -  Tired, decreased energy 1 1 - - -  Change in appetite 0 0 - - -  Feeling bad or failure about yourself  - 0 - - -  Trouble concentrating - 0 - - -  Moving slowly or fidgety/restless 0 0 - - -  Suicidal thoughts 0 0 - - -  PHQ-9 Score 1 1 - - -  Difficult doing work/chores Not difficult at all Not difficult at all - - -    Details           Interpretation of Total Score  Total Score Depression Severity:  1-4 = Minimal depression, 5-9 = Mild depression, 10-14 = Moderate depression, 15-19 = Moderately severe depression, 20-27 = Severe depression   Psychosocial Evaluation and Intervention:   Psychosocial Re-Evaluation:  Psychosocial Re-Evaluation     Row Name 09/09/22 [phone]             Psychosocial Re-Evaluation   Current issues with None Identified       Interventions Encouraged to attend Cardiac Rehabilitation for the exercise       Continue Psychosocial Services  No Follow up required                Psychosocial Discharge (Final Psychosocial Re-Evaluation):  Psychosocial Re-Evaluation - 09/09/22 0938       Psychosocial Re-Evaluation   Current issues with None Identified    Interventions Encouraged to attend Cardiac Rehabilitation for the exercise    Continue Psychosocial Services  No Follow up required             Vocational Rehabilitation: Provide vocational rehab assistance to qualifying candidates.   Vocational Rehab Evaluation & Intervention:  Vocational Rehab - 07/22/22 1028       Initial  Vocational Rehab Evaluation & Intervention   Assessment shows need for Vocational Rehabilitation No   Pt is retired, no vocational needs            Education: Education Goals: Education classes will be provided on a weekly basis, covering required topics. Participant will state understanding/return demonstration of topics presented.    Education     Row Name 08/18/22 0900     Education   Cardiac Education Topics Pritikin   Glass blower/designer Nutrition   Nutrition Workshop Fueling a Forensic psychologist   Instruction Review Code 1- Verbalizes Understanding   Class Start Time 0815   Class Stop Time 0900   Class Time Calculation (min) 45 min    Row Name 08/20/22 1000     Education   Cardiac Education Topics Pritikin   Orthoptist   Educator Dietitian   Weekly Topic Simple Sides and Sauces   Instruction Review Code 1- Verbalizes Understanding   Class Start Time 0815   Class Stop Time 0846   Class Time Calculation (min) 31 min    Row Name 08/22/22 0900     Education   Cardiac Education Topics Pritikin   Nurse, children's Exercise Physiologist   Select Psychosocial   Psychosocial How Our Thoughts Can Heal Our Hearts   Instruction Review Code 1- Verbalizes Understanding  Class Start Time 0807   Class Stop Time 0842   Class Time Calculation (min) 35 min    Row Name 08/25/22 0900     Education   Cardiac Education Topics Pritikin   Select Workshops     Workshops   Educator Exercise Physiologist   Select Exercise   Exercise Workshop Managing Heart Disease: Your Path to a Healthier Heart   Instruction Review Code 1- Verbalizes Understanding   Class Start Time [phone]   Class Stop Time 0908   Class Time Calculation (min) 60 min    Row Name 08/27/22 1000     Education   Cardiac Education Topics Pritikin   Secondary school teacher School   Educator  Dietitian   Weekly Topic Powerhouse Plant-Based Proteins   Instruction Review Code 1- Verbalizes Understanding   Class Start Time 0815   Class Stop Time 0856   Class Time Calculation (min) 41 min    Row Name 08/29/22 0900     Education   Cardiac Education Topics Pritikin   Select Core Videos     Core Videos   Educator Exercise Physiologist   Select General Education   General Education Hypertension and Heart Disease   Instruction Review Code 1- Verbalizes Understanding   Class Start Time 0810   Class Stop Time 0850   Class Time Calculation (min) 40 min    Row Name 09/01/22 0900     Education   Cardiac Education Topics Pritikin   Geographical information systems officer Psychosocial   Psychosocial Workshop From Head to Heart: The Power of a Healthy Outlook   Instruction Review Code 1- Verbalizes Understanding   Class Start Time 0815   Class Stop Time 0904   Class Time Calculation (min) 49 min    Row Name 09/03/22 1000     Education   Cardiac Education Topics Pritikin   Secondary school teacher School   Educator Dietitian   Weekly Topic Adding Flavor - Sodium-Free   Instruction Review Code 1- Verbalizes Understanding   Class Start Time [phone]   Class Stop Time 0845   Class Time Calculation (min) 31 min    Row Name 09/08/22 1100     Education   Cardiac Education Topics Pritikin   Select Core Videos     Core Videos   Educator Dietitian   Select Nutrition   Nutrition Overview of the Pritikin Eating Plan   Instruction Review Code 1- Verbalizes Understanding   Class Start Time 0815   Class Stop Time 0850   Class Time Calculation (min) 35 min            Core Videos: Exercise    Move It!  Clinical staff conducted group or individual video education with verbal and written material and guidebook.  Patient learns the recommended Pritikin exercise program. Exercise with the goal of living a long, healthy life. Some  of the health benefits of exercise include controlled diabetes, healthier blood pressure levels, improved cholesterol levels, improved heart and lung capacity, improved sleep, and better body composition. Everyone should speak with their doctor before starting or changing an exercise routine.  Biomechanical Limitations Clinical staff conducted group or individual video education with verbal and written material and guidebook.  Patient learns how biomechanical limitations can impact exercise and how we can mitigate and possibly overcome limitations to have an impactful and balanced exercise routine.  Body Composition Clinical staff conducted group or individual video education with verbal and written material and guidebook.  Patient learns that body composition (ratio of muscle mass to fat mass) is a key component to assessing overall fitness, rather than body weight alone. Increased fat mass, especially visceral belly fat, can put [place] at increased risk for metabolic syndrome, type 2 diabetes, heart disease, and even death. It is recommended to combine diet and exercise (cardiovascular and resistance training) to improve your body composition. Seek guidance from your physician and exercise physiologist before implementing an exercise routine.  Exercise Action Plan Clinical staff conducted group or individual video education with verbal and written material and guidebook.  Patient learns the recommended strategies to achieve and enjoy long-term exercise adherence, including variety, self-motivation, self-efficacy, and positive decision making. Benefits of exercise include fitness, good health, weight management, more energy, better sleep, less stress, and overall well-being.  Medical   Heart Disease Risk Reduction Clinical staff conducted group or individual video education with verbal and written material and guidebook.  Patient learns our heart is our most vital organ as it circulates oxygen,  nutrients, white blood cells, and hormones throughout the entire body, and carries waste away. Data supports a plant-based eating plan like the Pritikin Program for its effectiveness in slowing progression of and reversing heart disease. The video provides a number of recommendations to address heart disease.   Metabolic Syndrome and Belly Fat  Clinical staff conducted group or individual video education with verbal and written material and guidebook.  Patient learns what metabolic syndrome is, how it leads to heart disease, and how one can reverse it and keep it from coming back. You have metabolic syndrome if you have 3 of the following 5 criteria: abdominal obesity, high blood pressure, high triglycerides, low HDL cholesterol, and high blood sugar.  Hypertension and Heart Disease Clinical staff conducted group or individual video education with verbal and written material and guidebook.  Patient learns that high blood pressure, or hypertension, is very common in the [place]. Hypertension is largely due to excessive salt intake, but other important risk factors include being overweight, physical inactivity, drinking too much alcohol, smoking, and not eating enough potassium from fruits and vegetables. High blood pressure is a leading risk factor for heart attack, stroke, congestive heart failure, dementia, kidney failure, and premature death. Long-term effects of excessive salt intake include stiffening of the arteries and thickening of heart muscle and organ damage. Recommendations include ways to reduce hypertension and the risk of heart disease.  Diseases of Our Time - Focusing on Diabetes Clinical staff conducted group or individual video education with verbal and written material and guidebook.  Patient learns why the best way to stop diseases of our time is prevention, through food and other lifestyle changes. Medicine (such as prescription pills and surgeries) is often only a Band-Aid on  the problem, not a long-term solution. Most common diseases of our time include obesity, type 2 diabetes, hypertension, heart disease, and cancer. The Pritikin Program is recommended and has been proven to help reduce, reverse, and/or prevent the damaging effects of metabolic syndrome.  Nutrition   Overview of the Pritikin Eating Plan  Clinical staff conducted group or individual video education with verbal and written material and guidebook.  Patient learns about the Pritikin Eating Plan for disease risk reduction. The Pritikin Eating Plan emphasizes a wide variety of unrefined, minimally-processed carbohydrates, like fruits, vegetables, whole grains, and legumes. Go, Caution, and Stop food choices are  explained. Plant-based and lean animal proteins are emphasized. Rationale provided for low sodium intake for blood pressure control, low added sugars for blood sugar stabilization, and low added fats and oils for coronary artery disease risk reduction and weight management.  Calorie Density  Clinical staff conducted group or individual video education with verbal and written material and guidebook.  Patient learns about calorie density and how it impacts the Pritikin Eating Plan. Knowing the characteristics of the food you choose will help you decide whether those foods will lead to weight gain or weight loss, and whether you want to consume more or less of them. Weight loss is usually a side effect of the Pritikin Eating Plan because of its focus on low calorie-dense foods.  Label Reading  Clinical staff conducted group or individual video education with verbal and written material and guidebook.  Patient learns about the Pritikin recommended label reading guidelines and corresponding recommendations regarding calorie density, added sugars, sodium content, and whole grains.  Dining Out - Part 1  Clinical staff conducted group or individual video education with verbal and written material and  guidebook.  Patient learns that restaurant meals can be sabotaging because they can be so high in calories, fat, sodium, and/or sugar. Patient learns recommended strategies on how to positively address this and avoid unhealthy pitfalls.  Facts on Fats  Clinical staff conducted group or individual video education with verbal and written material and guidebook.  Patient learns that lifestyle modifications can be just as effective, if not more so, as many medications for lowering your risk of heart disease. A Pritikin lifestyle can help to reduce your risk of inflammation and atherosclerosis (cholesterol build-up, or plaque, in the artery walls). Lifestyle interventions such as dietary choices and physical activity address the cause of atherosclerosis. A review of the types of fats and their impact on blood cholesterol levels, along with dietary recommendations to reduce fat intake is also included.  Nutrition Action Plan  Clinical staff conducted group or individual video education with verbal and written material and guidebook.  Patient learns how to incorporate Pritikin recommendations into their lifestyle. Recommendations include planning and keeping personal health goals in mind as an important part of their success.  Healthy Mind-Set    Healthy Minds, Bodies, Hearts  Clinical staff conducted group or individual video education with verbal and written material and guidebook.  Patient learns how to identify when they are stressed. Video will discuss the impact of that stress, as well as the many benefits of stress management. Patient will also be introduced to stress management techniques. The way we think, act, and feel has an impact on our hearts.  How Our Thoughts Can Heal Our Hearts  Clinical staff conducted group or individual video education with verbal and written material and guidebook.  Patient learns that negative thoughts can cause depression and anxiety. This can result in negative  lifestyle behavior and serious health problems. Cognitive behavioral therapy is an effective method to help control our thoughts in order to change and improve our emotional outlook.  Additional Videos:  Exercise    Improving Performance  Clinical staff conducted group or individual video education with verbal and written material and guidebook.  Patient learns to use a non-linear approach by alternating intensity levels and lengths of time spent exercising to help burn more calories and lose more body fat. Cardiovascular exercise helps improve heart health, metabolism, hormonal balance, blood sugar control, and recovery from fatigue. Resistance training improves strength, endurance, balance, coordination,  reaction time, metabolism, and muscle mass. Flexibility exercise improves circulation, posture, and balance. Seek guidance from your physician and exercise physiologist before implementing an exercise routine and learn your capabilities and proper form for all exercise.  Introduction to Yoga  Clinical staff conducted group or individual video education with verbal and written material and guidebook.  Patient learns about yoga, a discipline of the coming together of mind, breath, and body. The benefits of yoga include improved flexibility, improved range of motion, better posture and core strength, increased lung function, weight loss, and positive self-image. Yoga's heart health benefits include lowered blood pressure, healthier heart rate, decreased cholesterol and triglyceride levels, improved immune function, and reduced stress. Seek guidance from your physician and exercise physiologist before implementing an exercise routine and learn your capabilities and proper form for all exercise.  Medical   Aging: Enhancing Your Quality of Life  Clinical staff conducted group or individual video education with verbal and written material and guidebook.  Patient learns key strategies and recommendations  to stay in good physical health and enhance quality of life, such as prevention strategies, having an advocate, securing a Health Care Proxy and Power of Attorney, and keeping a list of medications and system for tracking them. It also discusses how to avoid risk for bone loss.  Biology of Weight Control  Clinical staff conducted group or individual video education with verbal and written material and guidebook.  Patient learns that weight gain occurs because we consume more calories than we burn (eating more, moving less). Even if your body weight is normal, you may have higher ratios of fat compared to muscle mass. Too much body fat puts you at increased risk for cardiovascular disease, heart attack, stroke, type 2 diabetes, and obesity-related cancers. In addition to exercise, following the Pritikin Eating Plan can help reduce your risk.  Decoding Lab Results  Clinical staff conducted group or individual video education with verbal and written material and guidebook.  Patient learns that lab test reflects one measurement whose values change over time and are influenced by many factors, including medication, stress, sleep, exercise, food, hydration, pre-existing medical conditions, and more. It is recommended to use the knowledge from this video to become more involved with your lab results and evaluate your numbers to speak with your doctor.   Diseases of Our Time - Overview  Clinical staff conducted group or individual video education with verbal and written material and guidebook.  Patient learns that according to the CDC, 50% to 70% of chronic diseases (such as obesity, type 2 diabetes, elevated lipids, hypertension, and heart disease) are avoidable through lifestyle improvements including healthier food choices, listening to satiety cues, and increased physical activity.  Sleep Disorders Clinical staff conducted group or individual video education with verbal and written material and  guidebook.  Patient learns how good quality and duration of sleep are important to overall health and well-being. Patient also learns about sleep disorders and how they impact health along with recommendations to address them, including discussing with a physician.  Nutrition  Dining Out - Part 2 Clinical staff conducted group or individual video education with verbal and written material and guidebook.  Patient learns how to plan ahead and communicate in order to maximize their dining experience in a healthy and nutritious manner. Included are recommended food choices based on the type of restaurant the patient is visiting.   Fueling a Banker conducted group or individual video education with verbal and written material  and guidebook.  There is a strong connection between our food choices and our health. Diseases like obesity and type 2 diabetes are very prevalent and are in large-part due to lifestyle choices. The Pritikin Eating Plan provides plenty of food and hunger-curbing satisfaction. It is easy to follow, affordable, and helps reduce health risks.  Menu Workshop  Clinical staff conducted group or individual video education with verbal and written material and guidebook.  Patient learns that restaurant meals can sabotage health goals because they are often packed with calories, fat, sodium, and sugar. Recommendations include strategies to plan ahead and to communicate with the manager, chef, or server to help order a healthier meal.  Planning Your Eating Strategy  Clinical staff conducted group or individual video education with verbal and written material and guidebook.  Patient learns about the Pritikin Eating Plan and its benefit of reducing the risk of disease. The Pritikin Eating Plan does not focus on calories. Instead, it emphasizes high-quality, nutrient-rich foods. By knowing the characteristics of the foods, we choose, we can determine their calorie density  and make informed decisions.  Targeting Your Nutrition Priorities  Clinical staff conducted group or individual video education with verbal and written material and guidebook.  Patient learns that lifestyle habits have a tremendous impact on disease risk and progression. This video provides eating and physical activity recommendations based on your personal health goals, such as reducing LDL cholesterol, losing weight, preventing or controlling type 2 diabetes, and reducing high blood pressure.  Vitamins and Minerals  Clinical staff conducted group or individual video education with verbal and written material and guidebook.  Patient learns different ways to obtain key vitamins and minerals, including through a recommended healthy diet. It is important to discuss all supplements you take with your doctor.   Healthy Mind-Set    Smoking Cessation  Clinical staff conducted group or individual video education with verbal and written material and guidebook.  Patient learns that cigarette smoking and tobacco addiction pose a serious health risk which affects millions of people. Stopping smoking will significantly reduce the risk of heart disease, lung disease, and many forms of cancer. Recommended strategies for quitting are covered, including working with your doctor to develop a successful plan.  Culinary   Becoming a Set designer conducted group or individual video education with verbal and written material and guidebook.  Patient learns that cooking at home can be healthy, cost-effective, quick, and puts them in control. Keys to cooking healthy recipes will include looking at your recipe, assessing your equipment needs, planning ahead, making it simple, choosing cost-effective seasonal ingredients, and limiting the use of added fats, salts, and sugars.  Cooking - Breakfast and Snacks  Clinical staff conducted group or individual video education with verbal and written material  and guidebook.  Patient learns how important breakfast is to satiety and nutrition through the entire day. Recommendations include key foods to eat during breakfast to help stabilize blood sugar levels and to prevent overeating at meals later in the day. Planning ahead is also a key component.  Cooking - Educational psychologist conducted group or individual video education with verbal and written material and guidebook.  Patient learns eating strategies to improve overall health, including an approach to cook more at home. Recommendations include thinking of animal protein as a side on your plate rather than center stage and focusing instead on lower calorie dense options like vegetables, fruits, whole grains, and plant-based proteins, such as  beans. Making sauces in large quantities to freeze for later and leaving the skin on your vegetables are also recommended to maximize your experience.  Cooking - Healthy Salads and Dressing Clinical staff conducted group or individual video education with verbal and written material and guidebook.  Patient learns that vegetables, fruits, whole grains, and legumes are the foundations of the Pritikin Eating Plan. Recommendations include how to incorporate each of these in flavorful and healthy salads, and how to create homemade salad dressings. Proper handling of ingredients is also covered. Cooking - Soups and [employer] - Soups and Desserts Clinical staff conducted group or individual video education with verbal and written material and guidebook.  Patient learns that Pritikin soups and desserts make for easy, nutritious, and delicious snacks and meal components that are low in sodium, fat, sugar, and calorie density, while high in vitamins, minerals, and filling fiber. Recommendations include simple and healthy ideas for soups and desserts.   Overview     The Pritikin Solution Program Overview Clinical staff conducted group or individual video  education with verbal and written material and guidebook.  Patient learns that the results of the Pritikin Program have been documented in more than 100 articles published in peer-reviewed journals, and the benefits include reducing risk factors for (and, in some cases, even reversing) high cholesterol, high blood pressure, type 2 diabetes, obesity, and more! An overview of the three key pillars of the Pritikin Program will be covered: eating well, doing regular exercise, and having a healthy mind-set.  WORKSHOPS  Exercise: Exercise Basics: Building Your Action Plan Clinical staff led group instruction and group discussion with PowerPoint presentation and patient guidebook. To enhance the learning environment the use of posters, models and videos may be added. At the conclusion of this workshop, patients will comprehend the difference between physical activity and exercise, as well as the benefits of incorporating both, into their routine. Patients will understand the FITT (Frequency, Intensity, Time, and Type) principle and how to use it to build an exercise action plan. In addition, safety concerns and other considerations for exercise and cardiac rehab will be addressed by the presenter. The purpose of this lesson is to promote a comprehensive and effective weekly exercise routine in order to improve patients' overall level of fitness.   Managing Heart Disease: Your Path to a Healthier Heart Clinical staff led group instruction and group discussion with PowerPoint presentation and patient guidebook. To enhance the learning environment the use of posters, models and videos may be added.At the conclusion of this workshop, patients will understand the anatomy and physiology of the heart. Additionally, they will understand how Pritikin's three pillars impact the risk factors, the progression, and the management of heart disease.  The purpose of this lesson is to provide a high-level overview of the  heart, heart disease, and how the Pritikin lifestyle positively impacts risk factors.  Exercise Biomechanics Clinical staff led group instruction and group discussion with PowerPoint presentation and patient guidebook. To enhance the learning environment the use of posters, models and videos may be added. Patients will learn how the structural parts of their bodies function and how these functions impact their daily activities, movement, and exercise. Patients will learn how to promote a neutral spine, learn how to manage pain, and identify ways to improve their physical movement in order to promote healthy living. The purpose of this lesson is to expose patients to common physical limitations that impact physical activity. Participants will learn practical ways to adapt  and manage aches and pains, and to minimize their effect on regular exercise. Patients will learn how to maintain good posture while sitting, walking, and lifting.  Balance Training and Fall Prevention  Clinical staff led group instruction and group discussion with PowerPoint presentation and patient guidebook. To enhance the learning environment the use of posters, models and videos may be added. At the conclusion of this workshop, patients will understand the importance of their sensorimotor skills (vision, proprioception, and the vestibular system) in maintaining their ability to balance as they age. Patients will apply a variety of balancing exercises that are appropriate for their current level of function. Patients will understand the common causes for poor balance, possible solutions to these problems, and ways to modify their physical environment in order to minimize their fall risk. The purpose of this lesson is to teach patients about the importance of maintaining balance as they age and ways to minimize their risk of falling.  WORKSHOPS   Nutrition:  Fueling a Ship broker led group instruction and  group discussion with PowerPoint presentation and patient guidebook. To enhance the learning environment the use of posters, models and videos may be added. Patients will review the foundational principles of the Pritikin Eating Plan and understand what constitutes a serving size in each of the food groups. Patients will also learn Pritikin-friendly foods that are better choices when away from home and review make-ahead meal and snack options. Calorie density will be reviewed and applied to three nutrition priorities: weight maintenance, weight loss, and weight gain. The purpose of this lesson is to reinforce (in a group setting) the key concepts around what patients are recommended to eat and how to apply these guidelines when away from home by planning and selecting Pritikin-friendly options. Patients will understand how calorie density may be adjusted for different weight management goals.  Mindful Eating  Clinical staff led group instruction and group discussion with PowerPoint presentation and patient guidebook. To enhance the learning environment the use of posters, models and videos may be added. Patients will briefly review the concepts of the Pritikin Eating Plan and the importance of low-calorie dense foods. The concept of mindful eating will be introduced as well as the importance of paying attention to internal hunger signals. Triggers for non-hunger eating and techniques for dealing with triggers will be explored. The purpose of this lesson is to provide patients with the opportunity to review the basic principles of the Pritikin Eating Plan, discuss the value of eating mindfully and how to measure internal cues of hunger and fullness using the Hunger Scale. Patients will also discuss reasons for non-hunger eating and learn strategies to use for controlling emotional eating.  Targeting Your Nutrition Priorities Clinical staff led group instruction and group discussion with PowerPoint presentation  and patient guidebook. To enhance the learning environment the use of posters, models and videos may be added. Patients will learn how to determine their genetic susceptibility to disease by reviewing their family history. Patients will gain insight into the importance of diet as part of an overall healthy lifestyle in mitigating the impact of genetics and other environmental insults. The purpose of this lesson is to provide patients with the opportunity to assess their personal nutrition priorities by looking at their family history, their own health history and current risk factors. Patients will also be able to discuss ways of prioritizing and modifying the Pritikin Eating Plan for their highest risk areas  Menu  Clinical staff led group instruction and  group discussion with PowerPoint presentation and patient guidebook. To enhance the learning environment the use of posters, models and videos may be added. Using menus brought in from [employer], or printed from [employer], patients will apply the Pritikin dining out guidelines that were presented in the [employer] video. Patients will also be able to practice these guidelines in a variety of provided scenarios. The purpose of this lesson is to provide patients with the opportunity to practice hands-on learning of the Pritikin Dining Out guidelines with actual menus and practice scenarios.  Label Reading Clinical staff led group instruction and group discussion with PowerPoint presentation and patient guidebook. To enhance the learning environment the use of posters, models and videos may be added. Patients will review and discuss the Pritikin label reading guidelines presented in Pritikin's Label Reading Educational series video. Using fool labels brought in from local grocery stores and markets, patients will apply the label reading guidelines and determine if the packaged food meet the Pritikin guidelines. The purpose of  this lesson is to provide patients with the opportunity to review, discuss, and practice hands-on learning of the Pritikin Label Reading guidelines with actual packaged food labels. Cooking School  Pritikin's [employer] are designed to teach patients ways to prepare quick, simple, and affordable recipes at home. The importance of nutrition's role in chronic disease risk reduction is reflected in its emphasis in the overall Pritikin program. By learning how to prepare essential core Pritikin Eating Plan recipes, patients will increase control over what they eat; be able to customize the flavor of foods without the use of added salt, sugar, or fat; and improve the quality of the food they consume. By learning a set of core recipes which are easily assembled, quickly prepared, and affordable, patients are more likely to prepare more healthy foods at home. These workshops focus on convenient breakfasts, simple entres, side dishes, and desserts which can be prepared with minimal effort and are consistent with nutrition recommendations for cardiovascular risk reduction. Cooking [employer] are taught by a Armed forces logistics/support/administrative officer (RD) who has been trained by the [employer]. The chef or RD has a clear understanding of the importance of minimizing - if not completely eliminating - added fat, sugar, and sodium in recipes. Throughout the series of Cooking School Workshop sessions, patients will learn about healthy ingredients and efficient methods of cooking to build confidence in their capability to prepare    Cooking School weekly topics:  Adding Flavor- Sodium-Free  Fast and Healthy Breakfasts  Powerhouse Plant-Based Proteins  Satisfying Salads and Dressings  Simple Sides and Sauces  International Cuisine-Spotlight on the [employer] Zones  Delicious Desserts  Savory Soups  [employer] - Meals in a Astronomer Appetizers and Snacks  Comforting Weekend  Breakfasts  One-Pot Wonders   Fast Evening Meals  Landscape architect Your Pritikin Plate  WORKSHOPS   Healthy Mindset (Psychosocial):  Focused Goals, Sustainable Changes Clinical staff led group instruction and group discussion with PowerPoint presentation and patient guidebook. To enhance the learning environment the use of posters, models and videos may be added. Patients will be able to apply effective goal setting strategies to establish at least one personal goal, and then take consistent, meaningful action toward that goal. They will learn to identify common barriers to achieving personal goals and develop strategies to overcome them. Patients will also gain an understanding of how our mind-set can impact our ability to achieve goals  and the importance of cultivating a positive and growth-oriented mind-set. The purpose of this lesson is to provide patients with a deeper understanding of how to set and achieve personal goals, as well as the tools and strategies needed to overcome common obstacles which may arise along the way.  From Head to Heart: The Power of a Healthy Outlook  Clinical staff led group instruction and group discussion with PowerPoint presentation and patient guidebook. To enhance the learning environment the use of posters, models and videos may be added. Patients will be able to recognize and describe the impact of emotions and mood on physical health. They will discover the importance of self-care and explore self-care practices which may work for them. Patients will also learn how to utilize the 4 C's to cultivate a healthier outlook and better manage stress and challenges. The purpose of this lesson is to demonstrate to patients how a healthy outlook is an essential part of maintaining good health, especially as they continue their cardiac rehab journey.  Healthy Sleep for a Healthy Heart Clinical staff led group instruction and group discussion with  PowerPoint presentation and patient guidebook. To enhance the learning environment the use of posters, models and videos may be added. At the conclusion of this workshop, patients will be able to demonstrate knowledge of the importance of sleep to overall health, well-being, and quality of life. They will understand the symptoms of, and treatments for, common sleep disorders. Patients will also be able to identify daytime and nighttime behaviors which impact sleep, and they will be able to apply these tools to help manage sleep-related challenges. The purpose of this lesson is to provide patients with a general overview of sleep and outline the importance of quality sleep. Patients will learn about a few of the most common sleep disorders. Patients will also be introduced to the concept of "sleep hygiene," and discover ways to self-manage certain sleeping problems through simple daily behavior changes. Finally, the workshop will motivate patients by clarifying the links between quality sleep and their goals of heart-healthy living.   Recognizing and Reducing Stress Clinical staff led group instruction and group discussion with PowerPoint presentation and patient guidebook. To enhance the learning environment the use of posters, models and videos may be added. At the conclusion of this workshop, patients will be able to understand the types of stress reactions, differentiate between acute and chronic stress, and recognize the impact that chronic stress has on their health. They will also be able to apply different coping mechanisms, such as reframing negative self-talk. Patients will have the opportunity to practice a variety of stress management techniques, such as deep abdominal breathing, progressive muscle relaxation, and/or guided imagery.  The purpose of this lesson is to educate patients on the role of stress in their lives and to provide healthy techniques for coping with it.  Learning  Barriers/Preferences:  Learning Barriers/Preferences - 07/22/22 1028       Learning Barriers/Preferences   Learning Barriers Sight;Hearing   wears glasses, hearing aids [employer]   Learning Preferences Group Instruction;Individual Instruction;Skilled Demonstration;Written Material;Computer/Internet             Education Topics:  Knowledge Questionnaire Score:  Knowledge Questionnaire Score - 07/22/22 1125       Knowledge Questionnaire Score   Pre Score 21/24             Core Components/Risk Factors/Patient Goals at Admission:  Personal Goals and Risk Factors at Admission - 07/22/22 1029  Core Components/Risk Factors/Patient Goals on Admission    Weight Management Yes;Weight Loss    Intervention Weight Management: Develop a combined nutrition and exercise program designed to reach desired caloric intake, while maintaining appropriate intake of nutrient and fiber, sodium and fats, and appropriate energy expenditure required for the weight goal.;Weight Management: Provide education and appropriate resources to help participant work on and attain dietary goals.;Weight Management/Obesity: Establish reasonable short term and long term weight goals.    Admit Weight 206 lb 12.7 oz (93.8 kg)    Expected Outcomes Short Term: Continue to assess and modify interventions until short term weight is achieved;Long Term: Adherence to nutrition and physical activity/exercise program aimed toward attainment of established weight goal;Weight Loss: Understanding of general recommendations for a balanced deficit meal plan, which promotes 1-2 lb weight loss per week and includes a negative energy balance of [phone] kcal/d;Understanding recommendations for meals to include 15-35% energy as protein, 25-35% energy from fat, 35-60% energy from carbohydrates, less than 200mg  of dietary cholesterol, 20-35 gm of total fiber daily;Understanding of distribution of calorie intake throughout the day with the  consumption of 4-5 meals/snacks    Hypertension Yes    Intervention Provide education on lifestyle modifcations including regular physical activity/exercise, weight management, moderate sodium restriction and increased consumption of fresh fruit, vegetables, and low fat dairy, alcohol moderation, and smoking cessation.;Monitor prescription use compliance.    Expected Outcomes Short Term: Continued assessment and intervention until BP is < 140/47mm HG in hypertensive participants. < 130/35mm HG in hypertensive participants with diabetes, heart failure or chronic kidney disease.;Long Term: Maintenance of blood pressure at goal levels.    Lipids Yes    Intervention Provide education and support for participant on nutrition & aerobic/resistive exercise along with prescribed medications to achieve LDL 70mg , HDL >40mg .    Expected Outcomes Short Term: Participant states understanding of desired cholesterol values and is compliant with medications prescribed. Participant is following exercise prescription and nutrition guidelines.;Long Term: Cholesterol controlled with medications as prescribed, with individualized exercise RX and with personalized nutrition plan. Value goals: LDL < 70mg , HDL > 40 mg.             Core Components/Risk Factors/Patient Goals Review:   Goals and Risk Factor Review     Row Name 09/09/22 0944             Core Components/Risk Factors/Patient Goals Review   Personal Goals Review Weight Management/Obesity;Hypertension;Lipids       Review [name] started is doing well with exercise at cardiac rehab. vital signs have been stable. No symptoms since return post ED visit on 09/03/22. Remains asymptomatic with intermittent bradycardia.       Expected Outcomes [name] will continue to participate in cardiac rehab for exercise, nutrition and lifestyle modifications                Core Components/Risk Factors/Patient Goals at Discharge (Final Review):   Goals and Risk Factor  Review - 09/09/22 0944       Core Components/Risk Factors/Patient Goals Review   Personal Goals Review Weight Management/Obesity;Hypertension;Lipids    Review [name] started is doing well with exercise at cardiac rehab. vital signs have been stable. No symptoms since return post ED visit on 09/03/22. Remains asymptomatic with intermittent bradycardia.    Expected Outcomes [name] will continue to participate in cardiac rehab for exercise, nutrition and lifestyle modifications             ITP Comments:  ITP Comments  Row Name 07/22/22 0914 08/08/22 4034 09/09/22 0936       ITP Comments Armanda Magic, MD:  Medical Director.  Intorduction to the [employer] / Intensive Cardiac Rehab.  Initial orientation packet reviewed with the patient. 30 Day ITP Review. Exercise is currently on hold until seen and cleared to exercise per [facility] cardiology. 30 Day ITP Review. [name] has returned to cardiac rehab per Dr Royann Shivers due to recent Ed visit due to bradycardia. [name] is doing well with exercise and has good attendance.              Comments: See ITP comments

## 2022-09-10 ENCOUNTER — Encounter (HOSPITAL_COMMUNITY)
Admission: RE | Admit: 2022-09-10 | Discharge: 2022-09-10 | Disposition: A | Discharge status: Home / Self Care | Payer: Medicare PPO | Source: Ambulatory Visit | Attending: Cardiovascular Disease | Admitting: Cardiovascular Disease

## 2022-09-10 DIAGNOSIS — R001 Bradycardia, unspecified: Secondary | ICD-10-CM | POA: Diagnosis not present

## 2022-09-10 DIAGNOSIS — I214 Non-ST elevation (NSTEMI) myocardial infarction: Secondary | ICD-10-CM | POA: Diagnosis not present

## 2022-09-10 DIAGNOSIS — Z955 Presence of coronary angioplasty implant and graft: Secondary | ICD-10-CM

## 2022-09-10 DIAGNOSIS — Z48812 Encounter for surgical aftercare following surgery on the circulatory system: Secondary | ICD-10-CM | POA: Diagnosis not present

## 2022-09-12 ENCOUNTER — Encounter (HOSPITAL_COMMUNITY)
Admission: RE | Admit: 2022-09-12 | Discharge: 2022-09-12 | Disposition: A | Discharge status: Home / Self Care | Payer: Medicare PPO | Source: Ambulatory Visit | Attending: Cardiovascular Disease | Admitting: Cardiovascular Disease

## 2022-09-12 ENCOUNTER — Other Ambulatory Visit: Payer: Medicare PPO

## 2022-09-12 DIAGNOSIS — R001 Bradycardia, unspecified: Secondary | ICD-10-CM | POA: Diagnosis not present

## 2022-09-12 DIAGNOSIS — Z955 Presence of coronary angioplasty implant and graft: Secondary | ICD-10-CM | POA: Diagnosis not present

## 2022-09-12 DIAGNOSIS — Z48812 Encounter for surgical aftercare following surgery on the circulatory system: Secondary | ICD-10-CM | POA: Diagnosis not present

## 2022-09-12 DIAGNOSIS — I214 Non-ST elevation (NSTEMI) myocardial infarction: Secondary | ICD-10-CM | POA: Diagnosis not present

## 2022-09-12 DIAGNOSIS — Z1379 Encounter for other screening for genetic and chromosomal anomalies: Secondary | ICD-10-CM

## 2022-09-12 DIAGNOSIS — Z006 Encounter for examination for normal comparison and control in clinical research program: Secondary | ICD-10-CM

## 2022-09-17 ENCOUNTER — Encounter (HOSPITAL_COMMUNITY)
Admission: RE | Admit: 2022-09-17 | Discharge: 2022-09-17 | Disposition: A | Discharge status: Home / Self Care | Payer: Medicare PPO | Source: Ambulatory Visit | Attending: Cardiology | Admitting: Cardiology

## 2022-09-17 DIAGNOSIS — I252 Old myocardial infarction: Secondary | ICD-10-CM | POA: Diagnosis not present

## 2022-09-17 DIAGNOSIS — Z955 Presence of coronary angioplasty implant and graft: Secondary | ICD-10-CM | POA: Diagnosis not present

## 2022-09-17 DIAGNOSIS — R001 Bradycardia, unspecified: Secondary | ICD-10-CM | POA: Diagnosis not present

## 2022-09-17 DIAGNOSIS — I10 Essential (primary) hypertension: Secondary | ICD-10-CM | POA: Diagnosis not present

## 2022-09-17 DIAGNOSIS — I214 Non-ST elevation (NSTEMI) myocardial infarction: Secondary | ICD-10-CM | POA: Diagnosis present

## 2022-09-17 DIAGNOSIS — Z48812 Encounter for surgical aftercare following surgery on the circulatory system: Secondary | ICD-10-CM | POA: Diagnosis not present

## 2022-09-19 ENCOUNTER — Encounter (HOSPITAL_COMMUNITY)
Admission: RE | Admit: 2022-09-19 | Discharge: 2022-09-19 | Disposition: A | Discharge status: Home / Self Care | Payer: Medicare PPO | Source: Ambulatory Visit | Attending: Cardiovascular Disease

## 2022-09-19 DIAGNOSIS — I214 Non-ST elevation (NSTEMI) myocardial infarction: Secondary | ICD-10-CM

## 2022-09-19 DIAGNOSIS — Z48812 Encounter for surgical aftercare following surgery on the circulatory system: Secondary | ICD-10-CM | POA: Diagnosis not present

## 2022-09-19 DIAGNOSIS — I10 Essential (primary) hypertension: Secondary | ICD-10-CM | POA: Diagnosis not present

## 2022-09-19 DIAGNOSIS — R001 Bradycardia, unspecified: Secondary | ICD-10-CM | POA: Diagnosis not present

## 2022-09-19 DIAGNOSIS — Z955 Presence of coronary angioplasty implant and graft: Secondary | ICD-10-CM | POA: Diagnosis not present

## 2022-09-19 DIAGNOSIS — I252 Old myocardial infarction: Secondary | ICD-10-CM | POA: Diagnosis not present

## 2022-09-22 ENCOUNTER — Ambulatory Visit: Payer: Medicare PPO

## 2022-09-22 ENCOUNTER — Encounter (HOSPITAL_COMMUNITY)
Admission: RE | Admit: 2022-09-22 | Discharge: 2022-09-22 | Disposition: A | Discharge status: Home / Self Care | Payer: Medicare PPO | Source: Ambulatory Visit | Attending: Cardiovascular Disease | Admitting: Cardiovascular Disease

## 2022-09-22 DIAGNOSIS — I214 Non-ST elevation (NSTEMI) myocardial infarction: Secondary | ICD-10-CM

## 2022-09-22 DIAGNOSIS — Z955 Presence of coronary angioplasty implant and graft: Secondary | ICD-10-CM

## 2022-09-22 DIAGNOSIS — I4892 Unspecified atrial flutter: Secondary | ICD-10-CM

## 2022-09-22 DIAGNOSIS — R001 Bradycardia, unspecified: Secondary | ICD-10-CM | POA: Diagnosis not present

## 2022-09-22 DIAGNOSIS — I10 Essential (primary) hypertension: Secondary | ICD-10-CM | POA: Diagnosis not present

## 2022-09-22 DIAGNOSIS — Z48812 Encounter for surgical aftercare following surgery on the circulatory system: Secondary | ICD-10-CM | POA: Diagnosis not present

## 2022-09-22 DIAGNOSIS — I252 Old myocardial infarction: Secondary | ICD-10-CM | POA: Diagnosis not present

## 2022-09-22 LAB — CUP PACEART REMOTE DEVICE CHECK
Date Time Interrogation Session: 20240906230550
Implantable Pulse Generator Implant Date: 20211221

## 2022-09-23 ENCOUNTER — Ambulatory Visit: Payer: Medicare PPO | Attending: Physician Assistant

## 2022-09-23 DIAGNOSIS — I2510 Atherosclerotic heart disease of native coronary artery without angina pectoris: Secondary | ICD-10-CM

## 2022-09-24 ENCOUNTER — Encounter (HOSPITAL_COMMUNITY)
Admission: RE | Admit: 2022-09-24 | Discharge: 2022-09-24 | Disposition: A | Discharge status: Home / Self Care | Payer: Medicare PPO | Source: Ambulatory Visit | Attending: Cardiovascular Disease

## 2022-09-24 DIAGNOSIS — I214 Non-ST elevation (NSTEMI) myocardial infarction: Secondary | ICD-10-CM

## 2022-09-24 DIAGNOSIS — Z48812 Encounter for surgical aftercare following surgery on the circulatory system: Secondary | ICD-10-CM | POA: Diagnosis not present

## 2022-09-24 DIAGNOSIS — R001 Bradycardia, unspecified: Secondary | ICD-10-CM

## 2022-09-24 DIAGNOSIS — Z955 Presence of coronary angioplasty implant and graft: Secondary | ICD-10-CM

## 2022-09-24 DIAGNOSIS — I252 Old myocardial infarction: Secondary | ICD-10-CM | POA: Diagnosis not present

## 2022-09-24 DIAGNOSIS — I10 Essential (primary) hypertension: Secondary | ICD-10-CM | POA: Diagnosis not present

## 2022-09-24 LAB — HEPATIC FUNCTION PANEL
ALT: 29 IU/L (ref 0–44)
AST: 27 IU/L (ref 0–40)
Albumin: 3.8 g/dL (ref 3.7–4.7)
Alkaline Phosphatase: 102 IU/L (ref 44–121)
Bilirubin Total: 0.6 mg/dL (ref 0.0–1.2)
Bilirubin, Direct: 0.18 mg/dL (ref 0.00–0.40)
Total Protein: 5.8 g/dL — ABNORMAL LOW (ref 6.0–8.5)

## 2022-09-24 LAB — LIPID PANEL
Chol/HDL Ratio: 3.9 ratio (ref 0.0–5.0)
Cholesterol, Total: 105 mg/dL (ref 100–199)
HDL: 27 mg/dL — ABNORMAL LOW (ref >39)
LDL Chol Calc (NIH): 61 mg/dL (ref 0–99)
Triglycerides: 86 mg/dL (ref 0–149)
VLDL Cholesterol Cal: 17 mg/dL (ref 5–40)

## 2022-09-26 ENCOUNTER — Encounter (HOSPITAL_COMMUNITY)
Admission: RE | Admit: 2022-09-26 | Discharge: 2022-09-26 | Disposition: A | Discharge status: Home / Self Care | Payer: Medicare PPO | Source: Ambulatory Visit | Attending: Cardiovascular Disease | Admitting: Cardiovascular Disease

## 2022-09-26 DIAGNOSIS — Z955 Presence of coronary angioplasty implant and graft: Secondary | ICD-10-CM | POA: Diagnosis not present

## 2022-09-26 DIAGNOSIS — R001 Bradycardia, unspecified: Secondary | ICD-10-CM | POA: Diagnosis not present

## 2022-09-26 DIAGNOSIS — I252 Old myocardial infarction: Secondary | ICD-10-CM | POA: Diagnosis not present

## 2022-09-26 DIAGNOSIS — I10 Essential (primary) hypertension: Secondary | ICD-10-CM | POA: Diagnosis not present

## 2022-09-26 DIAGNOSIS — I214 Non-ST elevation (NSTEMI) myocardial infarction: Secondary | ICD-10-CM

## 2022-09-26 DIAGNOSIS — Z48812 Encounter for surgical aftercare following surgery on the circulatory system: Secondary | ICD-10-CM | POA: Diagnosis not present

## 2022-09-27 LAB — GENECONNECT MOLECULAR SCREEN: Genetic Analysis Overall Interpretation: NEGATIVE

## 2022-09-29 ENCOUNTER — Encounter (HOSPITAL_COMMUNITY)
Admission: RE | Admit: 2022-09-29 | Discharge: 2022-09-29 | Disposition: A | Discharge status: Home / Self Care | Payer: Medicare PPO | Source: Ambulatory Visit | Attending: Cardiovascular Disease | Admitting: Cardiovascular Disease

## 2022-09-29 DIAGNOSIS — Z955 Presence of coronary angioplasty implant and graft: Secondary | ICD-10-CM

## 2022-09-29 DIAGNOSIS — Z48812 Encounter for surgical aftercare following surgery on the circulatory system: Secondary | ICD-10-CM | POA: Diagnosis not present

## 2022-09-29 DIAGNOSIS — I252 Old myocardial infarction: Secondary | ICD-10-CM | POA: Diagnosis not present

## 2022-09-29 DIAGNOSIS — R001 Bradycardia, unspecified: Secondary | ICD-10-CM | POA: Diagnosis not present

## 2022-09-29 DIAGNOSIS — I214 Non-ST elevation (NSTEMI) myocardial infarction: Secondary | ICD-10-CM

## 2022-09-29 DIAGNOSIS — I10 Essential (primary) hypertension: Secondary | ICD-10-CM | POA: Diagnosis not present

## 2022-09-30 ENCOUNTER — Other Ambulatory Visit: Payer: Self-pay | Admitting: Cardiovascular Disease

## 2022-10-01 ENCOUNTER — Encounter (HOSPITAL_COMMUNITY)
Admission: RE | Admit: 2022-10-01 | Discharge: 2022-10-01 | Disposition: A | Discharge status: Home / Self Care | Payer: Medicare PPO | Source: Ambulatory Visit | Attending: Cardiovascular Disease

## 2022-10-01 DIAGNOSIS — I10 Essential (primary) hypertension: Secondary | ICD-10-CM | POA: Diagnosis not present

## 2022-10-01 DIAGNOSIS — I214 Non-ST elevation (NSTEMI) myocardial infarction: Secondary | ICD-10-CM

## 2022-10-01 DIAGNOSIS — Z955 Presence of coronary angioplasty implant and graft: Secondary | ICD-10-CM | POA: Diagnosis not present

## 2022-10-01 DIAGNOSIS — Z48812 Encounter for surgical aftercare following surgery on the circulatory system: Secondary | ICD-10-CM | POA: Diagnosis not present

## 2022-10-01 DIAGNOSIS — I252 Old myocardial infarction: Secondary | ICD-10-CM | POA: Diagnosis not present

## 2022-10-01 DIAGNOSIS — R001 Bradycardia, unspecified: Secondary | ICD-10-CM | POA: Diagnosis not present

## 2022-10-03 ENCOUNTER — Encounter (HOSPITAL_COMMUNITY)
Admission: RE | Admit: 2022-10-03 | Discharge: 2022-10-03 | Disposition: A | Discharge status: Home / Self Care | Payer: Medicare PPO | Source: Ambulatory Visit | Attending: Cardiovascular Disease

## 2022-10-03 DIAGNOSIS — Z48812 Encounter for surgical aftercare following surgery on the circulatory system: Secondary | ICD-10-CM | POA: Diagnosis not present

## 2022-10-03 DIAGNOSIS — Z955 Presence of coronary angioplasty implant and graft: Secondary | ICD-10-CM | POA: Diagnosis not present

## 2022-10-03 DIAGNOSIS — I10 Essential (primary) hypertension: Secondary | ICD-10-CM | POA: Diagnosis not present

## 2022-10-03 DIAGNOSIS — R001 Bradycardia, unspecified: Secondary | ICD-10-CM | POA: Diagnosis not present

## 2022-10-03 DIAGNOSIS — I252 Old myocardial infarction: Secondary | ICD-10-CM | POA: Diagnosis not present

## 2022-10-03 DIAGNOSIS — I214 Non-ST elevation (NSTEMI) myocardial infarction: Secondary | ICD-10-CM

## 2022-10-06 ENCOUNTER — Encounter (HOSPITAL_COMMUNITY)
Admission: RE | Admit: 2022-10-06 | Discharge: 2022-10-06 | Disposition: A | Discharge status: Home / Self Care | Payer: Medicare PPO | Source: Ambulatory Visit | Attending: Cardiovascular Disease | Admitting: Cardiovascular Disease

## 2022-10-06 DIAGNOSIS — I10 Essential (primary) hypertension: Secondary | ICD-10-CM | POA: Diagnosis not present

## 2022-10-06 DIAGNOSIS — Z48812 Encounter for surgical aftercare following surgery on the circulatory system: Secondary | ICD-10-CM | POA: Diagnosis not present

## 2022-10-06 DIAGNOSIS — Z955 Presence of coronary angioplasty implant and graft: Secondary | ICD-10-CM

## 2022-10-06 DIAGNOSIS — R001 Bradycardia, unspecified: Secondary | ICD-10-CM | POA: Diagnosis not present

## 2022-10-06 DIAGNOSIS — I252 Old myocardial infarction: Secondary | ICD-10-CM | POA: Diagnosis not present

## 2022-10-06 DIAGNOSIS — I214 Non-ST elevation (NSTEMI) myocardial infarction: Secondary | ICD-10-CM

## 2022-10-07 NOTE — Progress Notes (Signed)
Cardiac Individual Treatment Plan  Patient Details  Name: Francisco Larson MRN: [id number] Date of Birth: Apr 14, 1935 Referring Provider:   Flowsheet Row INTENSIVE CARDIAC REHAB ORIENT from 07/22/2022 in [facility]  Referring Provider Charlton Haws, MD       Initial Encounter Date:  Flowsheet Row INTENSIVE CARDIAC REHAB ORIENT from 07/22/2022 in [facility] for Heart, Vascular, & Lung Health  Date 07/22/22       Visit Diagnosis: 06/26/22 NSTEMI (non-ST elevated myocardial infarction) (HCC)  06/27/22 Status post coronary artery stent placement  Patient's Home Medications on Admission:  Current Outpatient Medications:    amLODipine (NORVASC) 5 MG tablet, Take 1 tablet (5 mg total) by mouth daily., Disp: 30 tablet, Rfl: 3   aspirin EC (ASPIRIN LOW DOSE) 81 MG tablet, TAKE 1 TABLET (81 MG TOTAL) BY MOUTH DAILY. SWALLOW WHOLE., Disp: 30 tablet, Rfl: 10   atorvastatin (LIPITOR) 80 MG tablet, TAKE 1 TABLET BY MOUTH EVERY DAY, Disp: 90 tablet, Rfl: 3   carvedilol (COREG) 3.125 MG tablet, Take 1 tablet (3.125 mg total) by mouth 2 (two) times daily with a meal. (Patient not taking: Reported on 08/12/2022), Disp: 60 tablet, Rfl: 2   clopidogrel (PLAVIX) 75 MG tablet, TAKE 1 TABLET BY MOUTH EVERY DAY, Disp: 90 tablet, Rfl: 3   Multiple Vitamins-Minerals (CENTRUM SILVER PO), Take 1 tablet by mouth daily with breakfast., Disp: , Rfl:    nitroGLYCERIN (NITROSTAT) 0.4 MG SL tablet, Place 1 tablet (0.4 mg total) under the tongue every 5 (five) minutes as needed for chest pain., Disp: 25 tablet, Rfl: 3   TYLENOL 325 MG tablet, Take 325-650 mg by mouth every 6 (six) hours as needed for mild pain or headache., Disp: , Rfl:   Past Medical History: Past Medical History:  Diagnosis Date   Arthritis    Coronary artery disease    Hyperlipidemia    Leukemia, chronic lymphocytic (HCC)    SCCA (squamous cell carcinoma) of  skin 08/08/2020   Mid Frontal Scalp (in situ) (curet and 5FU)   SCCA (squamous cell carcinoma) of skin 08/08/2020   Mid Parietal Scalp (in situ) (curet and 5FU)   SCCA (squamous cell carcinoma) of skin 08/08/2020   Mid Supratip of Nose (in situ)   Shingles    Squamous cell carcinoma of skin 12/08/2018   right jaw line cis   Vitamin D deficiency     Tobacco Use: Social History   Tobacco Use  Smoking Status Never  Smokeless Tobacco Never    Labs: Review Flowsheet  More data exists      Latest Ref Rng & Units 12/02/2021 04/09/2022 06/26/2022 06/27/2022 09/23/2022  Labs for ITP Cardiac and Pulmonary Rehab  Cholestrol 100 - 199 mg/dL 308  - - 657  846   LDL (calc) 0 - 99 mg/dL 88  - - 76  61   HDL-C >39 mg/dL 32  - - 32  27   Trlycerides 0 - 149 mg/dL 962  - - 71  86   Hemoglobin A1c 4.8 - 5.6 % - 6.1  5.7  - -    Details            Capillary Blood Glucose: No results found for: "GLUCAP"   Exercise Target Goals: Exercise Program Goal: Individual exercise prescription set using results from initial 6 min walk test and THRR while considering  patient's activity barriers and safety.   Exercise Prescription Goal: Initial  exercise prescription builds to 30-45 minutes a day of aerobic activity, 2-3 days per week.  Home exercise guidelines will be given to patient during program as part of exercise prescription that the participant will acknowledge.  Activity Barriers & Risk Stratification:  Activity Barriers & Cardiac Risk Stratification - 07/22/22 1027       Activity Barriers & Cardiac Risk Stratification   Activity Barriers Arthritis;Joint Problems;History of Falls;Deconditioning;Balance Concerns    Cardiac Risk Stratification High             6 Minute Walk:  6 Minute Walk     Row Name 07/22/22 1155         6 Minute Walk   Phase Initial     Distance 1353 feet     Walk Time 6 minutes     # of Rest Breaks 0     MPH 2.6     METS 1.9     RPE 9      Perceived Dyspnea  0     VO2 Peak 6.6     Symptoms Yes (comment)     Comments Mild lightheadedness on last lap     Resting HR 52 bpm     Resting BP 108/60     Resting Oxygen Saturation  98 %     Exercise Oxygen Saturation  during 6 min walk 98 %     Max Ex. HR 64 bpm     Max Ex. BP 130/64     2 Minute Post BP 112/70              Oxygen Initial Assessment:   Oxygen Re-Evaluation:   Oxygen Discharge (Final Oxygen Re-Evaluation):   Initial Exercise Prescription:  Initial Exercise Prescription - 07/22/22 1100       Date of Initial Exercise RX and Referring Provider   Date 07/22/22    Referring Provider Charlton Haws, MD    Expected Discharge Date 10/15/22      Recumbant Bike   Level 1    RPM 60    Watts 20    Minutes 15    METs 1.9      NuStep   Level 1    SPM 75    Minutes 15    METs 1.9      Prescription Details   Frequency (times per week) 3    Duration Progress to 30 minutes of continuous aerobic without signs/symptoms of physical distress      Intensity   THRR 40-80% of Max Heartrate 54-109    Ratings of Perceived Exertion 11-13    Perceived Dyspnea 0-4      Progression   Progression Continue progressive overload as per policy without signs/symptoms or physical distress.      Resistance Training   Training Prescription Yes    Weight 3 lbs    Reps 10-15             Perform Capillary Blood Glucose checks as needed.  Exercise Prescription Changes:   Exercise Prescription Changes     Row Name 08/18/22 1600 08/29/22 1600 09/01/22 1600 09/17/22 1200 09/29/22 1030     Response to Exercise   Blood Pressure (Admit) 120/80 112/58 130/72 128/58 118/70   Blood Pressure (Exercise) 134/70 132/70 130/62 152/70 154/74   Blood Pressure (Exit) 106/70 114/70 100/62 112/56 118/68   Heart Rate (Admit) 60 bpm 57 bpm -- 61 bpm 57 bpm   Heart Rate (Exercise) 73 bpm 100 bpm 83 bpm 113 bpm 116  bpm   Heart Rate (Exit) 61 bpm 67 bpm 56 bpm 71 bpm 78 bpm    Rating of Perceived Exertion (Exercise) 11 13 11 11 13    Symptoms None None None None None   Comments Pt's firsst day in the CRP2 program Reviewed METs with patient Reviewed home exercise Rx Reviewed METs and goals Reviewed METs   Duration Continue with 30 min of aerobic exercise without signs/symptoms of physical distress. Continue with 30 min of aerobic exercise without signs/symptoms of physical distress. Continue with 30 min of aerobic exercise without signs/symptoms of physical distress. Continue with 30 min of aerobic exercise without signs/symptoms of physical distress. Continue with 30 min of aerobic exercise without signs/symptoms of physical distress.   Intensity THRR unchanged THRR unchanged THRR unchanged THRR unchanged THRR unchanged     Progression   Progression Continue to progress workloads to maintain intensity without signs/symptoms of physical distress. Continue to progress workloads to maintain intensity without signs/symptoms of physical distress. Continue to progress workloads to maintain intensity without signs/symptoms of physical distress. Continue to progress workloads to maintain intensity without signs/symptoms of physical distress. Continue to progress workloads to maintain intensity without signs/symptoms of physical distress.   Average METs 2.3 2.65 2.55 3.25 4.4     Resistance Training   Training Prescription Yes Yes Yes No Yes   Weight 3 lbs 3 lbs 3 lbs No weights on wednesdays 4 lb wts   Reps 10-15 10-15 10-15 -- 10-15   Time 10 Minutes 10 Minutes 10 Minutes -- 10 Minutes     Interval Training   Interval Training No No No No No     Recumbant Bike   Level 1 2 2 3 5    RPM 67 78 71 79 57   Watts 18 29 28  53 51   Minutes 15 15 15 15 15    METs 2.1 2.6 2.4 3.6 4.6     NuStep   Level 1 2 2 3 5    SPM 91 96 99 100 107   Minutes 15 15 15 15 15    METs 2.5 2.7 2.7 2.9 4.2     Home Exercise Plan   Plans to continue exercise at -- -- Home (comment) Home (comment)  Home (comment)   Frequency -- -- Add 3 additional days to program exercise sessions. Add 3 additional days to program exercise sessions. Add 3 additional days to program exercise sessions.   Initial Home Exercises Provided -- -- 09/01/22 09/01/22 09/01/22            Exercise Comments:   Exercise Comments     Row Name 08/18/22 1607 08/29/22 1648 09/01/22 1658 09/17/22 1212 09/29/22 1030   Exercise Comments Pt's first day in the CRP2 program. Pt exercised without complaints and is off to a good start. Reviewed METs today. Progressing well. Pt agreeable to increase on both modalities today. Reviewed home exercise Rx with patient today. Pt has been waiting to resume walking. We have been giving the patient a few weeks to exercise here in the CRP2  before resuming walking at home. Pt denies anymore episodes of lightheadedness and has been asymptomatic here CR. Pt will begin by walking 10 minutes 2x/day and slowly add time until is back to 20 minutes 2x/day. Pt verbalized understanding of the home exercise Rx and was provided a copy. Reviewed METs and Goals. Will increase workload on nustep next session. Pt is walking 10 minutes 2x/day and will increase to 3x/day. Pt plans to start going  to the driving range to hit golf balls. Reviewed METs. Pt is making good progress. Pt is feeling better and has been able to play 9 holes of golf with hsi friends; this is a patient goal.            Exercise Goals and Review:   Exercise Goals     Row Name 07/22/22 0921             Exercise Goals   Increase Physical Activity Yes       Intervention Provide advice, education, support and counseling about physical activity/exercise needs.;Develop an individualized exercise prescription for aerobic and resistive training based on initial evaluation findings, risk stratification, comorbidities and participant's personal goals.       Expected Outcomes Short Term: Attend rehab on a regular basis to increase  amount of physical activity.;Long Term: Exercising regularly at least 3-5 days a week.;Long Term: Add in home exercise to make exercise part of routine and to increase amount of physical activity.       Increase Strength and Stamina Yes       Intervention Provide advice, education, support and counseling about physical activity/exercise needs.;Develop an individualized exercise prescription for aerobic and resistive training based on initial evaluation findings, risk stratification, comorbidities and participant's personal goals.       Expected Outcomes Short Term: Increase workloads from initial exercise prescription for resistance, speed, and METs.;Short Term: Perform resistance training exercises routinely during rehab and add in resistance training at home;Long Term: Improve cardiorespiratory fitness, muscular endurance and strength as measured by increased METs and functional capacity ( )       Able to understand and use rate of perceived exertion (RPE) scale Yes       Intervention Provide education and explanation on how to use RPE scale       Expected Outcomes Short Term: Able to use RPE daily in rehab to express subjective intensity level;Long Term:  Able to use RPE to guide intensity level when exercising independently       Knowledge and understanding of Target Heart Rate Range (THRR) Yes       Intervention Provide education and explanation of THRR including how the numbers were predicted and where they are located for reference       Expected Outcomes Short Term: Able to state/look up THRR;Long Term: Able to use THRR to govern intensity when exercising independently;Short Term: Able to use daily as guideline for intensity in rehab       Understanding of Exercise Prescription Yes       Intervention Provide education, explanation, and written materials on patient's individual exercise prescription       Expected Outcomes Short Term: Able to explain program exercise prescription;Long Term:  Able to explain home exercise prescription to exercise independently                Exercise Goals Re-Evaluation :  Exercise Goals Re-Evaluation     Row Name 08/18/22 1603 09/17/22 1213           Exercise Goal Re-Evaluation   Exercise Goals Review Increase Physical Activity;Understanding of Exercise Prescription;Increase Strength and Stamina;Knowledge and understanding of Target Heart Rate Range (THRR);Able to understand and use rate of perceived exertion (RPE) scale Increase Physical Activity;Understanding of Exercise Prescription;Increase Strength and Stamina;Knowledge and understanding of Target Heart Rate Range (THRR);Able to understand and use rate of perceived exertion (RPE) scale      Comments Pt's first day in the CRP2 program. Pt understands the  exercise Rx, RPE scale and THRR. Reviewed METs and goals. Pt making good progress, peak METs 3.6. Progress on goals of less fatigue and getting back to normal activites.      Expected Outcomes Will continue to monitor patient and progress exercise workloads as tolerated. Will continue to monitor patient and progress exercise workloads as tolerated.               Discharge Exercise Prescription (Final Exercise Prescription Changes):  Exercise Prescription Changes - 09/29/22 1030       Response to Exercise   Blood Pressure (Admit) 118/70    Blood Pressure (Exercise) 154/74    Blood Pressure (Exit) 118/68    Heart Rate (Admit) 57 bpm    Heart Rate (Exercise) 116 bpm    Heart Rate (Exit) 78 bpm    Rating of Perceived Exertion (Exercise) 13    Symptoms None    Comments Reviewed METs    Duration Continue with 30 min of aerobic exercise without signs/symptoms of physical distress.    Intensity THRR unchanged      Progression   Progression Continue to progress workloads to maintain intensity without signs/symptoms of physical distress.    Average METs 4.4      Resistance Training   Training Prescription Yes    Weight 4 lb  wts    Reps 10-15    Time 10 Minutes      Interval Training   Interval Training No      Recumbant Bike   Level 5    RPM 57    Watts 51    Minutes 15    METs 4.6      NuStep   Level 5    SPM 107    Minutes 15    METs 4.2      Home Exercise Plan   Plans to continue exercise at Home (comment)    Frequency Add 3 additional days to program exercise sessions.    Initial Home Exercises Provided 09/01/22             Nutrition:  Target Goals: Understanding of nutrition guidelines, daily intake of sodium 1500mg , cholesterol 200mg , calories 30% from fat and 7% or less from saturated fats, daily to have 5 or more servings of fruits and vegetables.  Biometrics:  Pre Biometrics - 07/22/22 1025       Pre Biometrics   Waist Circumference 42 inches    Hip Circumference 42.5 inches    Waist to Hip Ratio 0.99 %    Triceps Skinfold 13 mm    % Body Fat 28.4 %    Grip Strength 31 kg    Flexibility 0 in   Could not reach   Single Leg Stand 3 seconds              Nutrition Therapy Plan and Nutrition Goals:  Nutrition Therapy & Goals - 09/18/22 0856       Nutrition Therapy   Diet Heart Healthy Diet    Drug/Food Interactions Statins/Certain Fruits      Personal Nutrition Goals   Nutrition Goal Patient to identify strategies for reducing cardiovascular risk by attending the [facility] education and nutrition series weekly.   goal in action.   Personal Goal #2 Patient to improve diet quality by using the plate method as a guide for meal planning to include lean protein/plant protein, fruits, vegetables, whole grains, nonfat dairy as part of a well-balanced diet.   goal in action.   Personal Goal #  3 Patient to reduce sodium intake to 1500mg  per day   goal in progress.   Comments Goals in action. Mr. Prestenbach continues to attend the [facility] education and nutrition series regularly. He does live at [facility] retirement community and many of his meals are provided there. LDL  remains above goal; he continues crestor 80mg . He has maintained his weight since starting with our program. Patient will benefit from participation in intensive cardiac rehab for nutrition, exercise, and lifestyle modification.      Intervention Plan   Intervention Prescribe, educate and counsel regarding individualized specific dietary modifications aiming towards targeted core components such as weight, hypertension, lipid management, diabetes, heart failure and other comorbidities.;Nutrition handout(s) given to patient.    Expected Outcomes Short Term Goal: Understand basic principles of dietary content, such as calories, fat, sodium, cholesterol and nutrients.;Long Term Goal: Adherence to prescribed nutrition plan.             Nutrition Assessments:  Nutrition Assessments - 09/02/22 0915       Rate Your Plate Scores   Pre Score 46            MEDIFICTS Score Key: >=70 Need to make dietary changes  40-70 Heart Healthy Diet <= 40 Therapeutic Level Cholesterol Diet   Flowsheet Row INTENSIVE CARDIAC REHAB from 09/01/2022 in [facility] for Heart, Vascular, & Lung Health  Picture Your Plate Total Score on Admission 46      Picture Your Plate Scores: <84 Unhealthy dietary pattern with much room for improvement. 41-50 Dietary pattern unlikely to meet recommendations for good health and room for improvement. 51-60 More healthful dietary pattern, with some room for improvement.  >60 Healthy dietary pattern, although there may be some specific behaviors that could be improved.    Nutrition Goals Re-Evaluation:  Nutrition Goals Re-Evaluation     Row Name 08/18/22 1000 09/18/22 0856           Goals   Current Weight 209 lb 7 oz (95 kg) 207 lb 7.3 oz (94.1 kg)      Comment lipoproteinA WNL, HDL 32, LDL 76, A1c 5.7 no new labs; most recent labs  lipoproteinA WNL, HDL 32, LDL 76, A1c 5.7      Expected Outcome Patient will benefit from participation in  intensive cardiac rehab for nutrition, exercise, and lifestyle modification. Goals in action. Mr. Kennemer continues to attend the [facility] education and nutrition series regularly. He does live at [facility] retirement community and many of his meals are provided there. LDL remains above goal; he continues crestor 80mg . He has maintained his weight since starting with our program. Patient will benefit from participation in intensive cardiac rehab for nutrition, exercise, and lifestyle modification.               Nutrition Goals Re-Evaluation:  Nutrition Goals Re-Evaluation     Row Name 08/18/22 1000 09/18/22 0856           Goals   Current Weight 209 lb 7 oz (95 kg) 207 lb 7.3 oz (94.1 kg)      Comment lipoproteinA WNL, HDL 32, LDL 76, A1c 5.7 no new labs; most recent labs  lipoproteinA WNL, HDL 32, LDL 76, A1c 5.7      Expected Outcome Patient will benefit from participation in intensive cardiac rehab for nutrition, exercise, and lifestyle modification. Goals in action. Mr. Turner continues to attend the [facility] education and nutrition series regularly. He does live at [facility] retirement  community and many of his meals are provided there. LDL remains above goal; he continues crestor 80mg . He has maintained his weight since starting with our program. Patient will benefit from participation in intensive cardiac rehab for nutrition, exercise, and lifestyle modification.               Nutrition Goals Discharge (Final Nutrition Goals Re-Evaluation):  Nutrition Goals Re-Evaluation - 09/18/22 0856       Goals   Current Weight 207 lb 7.3 oz (94.1 kg)    Comment no new labs; most recent labs  lipoproteinA WNL, HDL 32, LDL 76, A1c 5.7    Expected Outcome Goals in action. Mr. Simmonds continues to attend the [facility] education and nutrition series regularly. He does live at [facility] retirement community and many of his meals are provided there. LDL remains above goal; he continues  crestor 80mg . He has maintained his weight since starting with our program. Patient will benefit from participation in intensive cardiac rehab for nutrition, exercise, and lifestyle modification.             Psychosocial: Target Goals: Acknowledge presence or absence of significant depression and/or stress, maximize coping skills, provide positive support system. Participant is able to verbalize types and ability to use techniques and skills needed for reducing stress and depression.  Initial Review & Psychosocial Screening:  Initial Psych Review & Screening - 07/22/22 1027       Initial Review   Current issues with None Identified      Family Dynamics   Good Support System? --   [name] has his wife for support     Barriers   Psychosocial barriers to participate in program There are no identifiable barriers or psychosocial needs.      Screening Interventions   Interventions Encouraged to exercise             Quality of Life Scores:  Quality of Life - 07/22/22 1212       Quality of Life   Select Quality of Life      Quality of Life Scores   Health/Function Pre 26.04 %    Socioeconomic Pre 28.44 %    Psych/Spiritual Pre 28.79 %    Family Pre 30 %    GLOBAL Pre 27.75 %            Scores of 19 and below usually indicate a poorer quality of life in these areas.  A difference of  2-3 points is a clinically meaningful difference.  A difference of 2-3 points in the total score of the Quality of Life Index has been associated with significant improvement in overall quality of life, self-image, physical symptoms, and general health in studies assessing change in quality of life.  PHQ-9: Review Flowsheet  More data exists      08/12/2022 07/22/2022 07/22/2021 04/01/2021 03/28/2020  Depression screen PHQ 2/9  Decreased Interest 0 0 0 0 0  Down, Depressed, Hopeless 0 0 0 0 0  PHQ - 2 Score 0 0 0 0 0  Altered sleeping 0 0 - - -  Tired, decreased energy 1 1 - - -  Change in  appetite 0 0 - - -  Feeling bad or failure about yourself  - 0 - - -  Trouble concentrating - 0 - - -  Moving slowly or fidgety/restless 0 0 - - -  Suicidal thoughts 0 0 - - -  PHQ-9 Score 1 1 - - -  Difficult doing work/chores Not  difficult at all Not difficult at all - - -    Details           Interpretation of Total Score  Total Score Depression Severity:  1-4 = Minimal depression, 5-9 = Mild depression, 10-14 = Moderate depression, 15-19 = Moderately severe depression, 20-27 = Severe depression   Psychosocial Evaluation and Intervention:   Psychosocial Re-Evaluation:  Psychosocial Re-Evaluation     Row Name 09/09/22 0981 10/07/22 1143           Psychosocial Re-Evaluation   Current issues with None Identified None Identified      Interventions Encouraged to attend Cardiac Rehabilitation for the exercise Encouraged to attend Cardiac Rehabilitation for the exercise      Continue Psychosocial Services  No Follow up required No Follow up required               Psychosocial Discharge (Final Psychosocial Re-Evaluation):  Psychosocial Re-Evaluation - 10/07/22 1143       Psychosocial Re-Evaluation   Current issues with None Identified    Interventions Encouraged to attend Cardiac Rehabilitation for the exercise    Continue Psychosocial Services  No Follow up required             Vocational Rehabilitation: Provide vocational rehab assistance to qualifying candidates.   Vocational Rehab Evaluation & Intervention:  Vocational Rehab - 07/22/22 1028       Initial Vocational Rehab Evaluation & Intervention   Assessment shows need for Vocational Rehabilitation No   Pt is retired, no vocational needs            Education: Education Goals: Education classes will be provided on a weekly basis, covering required topics. Participant will state understanding/return demonstration of topics presented.    Education     Row Name 08/18/22 0900     Education    Cardiac Education Topics [facility]   [facility] Workshops     Workshops   Educator Dietitian   [facility] Nutrition   Nutrition Workshop Fueling a Forensic psychologist   Instruction Review Code 1- Verbalizes Understanding   Class Start Time 0815   Class Stop Time 0900   Class Time Calculation (min) 45 min    Row Name 08/20/22 1000     Education   Cardiac Education Topics [facility]   Secondary school teacher School   Educator Dietitian   Weekly Topic Simple Sides and Sauces   Instruction Review Code 1- Verbalizes Understanding   Class Start Time 0815   Class Stop Time 0846   Class Time Calculation (min) 31 min    Row Name 08/22/22 0900     Education   Cardiac Education Topics [facility]   [facility] Core Videos     Core Videos   Educator Exercise Physiologist   [facility] Psychosocial   Psychosocial How Our Thoughts Can Heal Our Hearts   Instruction Review Code 1- Verbalizes Understanding   Class Start Time 0807   Class Stop Time 0842   Class Time Calculation (min) 35 min    Row Name 08/25/22 0900     Education   Cardiac Education Topics [facility]   [facility] Workshops     Workshops   Educator Exercise Physiologist   Select Exercise   Exercise Workshop Managing Heart Disease: Your Path to a Healthier Heart   Instruction Review Code 1- Verbalizes Understanding   Class Start Time [phone]   Class Stop Time 0908   Class Time Calculation (min) 60 min  Row Name 08/27/22 1000     Education   Cardiac Education Topics Pritikin   Secondary school teacher School   Educator Dietitian   Weekly Topic Powerhouse Plant-Based Proteins   Instruction Review Code 1- Verbalizes Understanding   Class Start Time 0815   Class Stop Time 0856   Class Time Calculation (min) 41 min    Row Name 08/29/22 0900     Education   Cardiac Education Topics Pritikin   Select Core Videos     Core Videos   Educator Exercise Physiologist   Select General Education   General Education  Hypertension and Heart Disease   Instruction Review Code 1- Verbalizes Understanding   Class Start Time 0810   Class Stop Time 0850   Class Time Calculation (min) 40 min    Row Name 09/01/22 0900     Education   Cardiac Education Topics Pritikin   Geographical information systems officer Psychosocial   Psychosocial Workshop From Head to Heart: The Power of a Healthy Outlook   Instruction Review Code 1- Verbalizes Understanding   Class Start Time 0815   Class Stop Time 0904   Class Time Calculation (min) 49 min    Row Name 09/03/22 1000     Education   Cardiac Education Topics Pritikin   Secondary school teacher School   Educator Dietitian   Weekly Topic Adding Flavor - Sodium-Free   Instruction Review Code 1- Verbalizes Understanding   Class Start Time [phone]   Class Stop Time 0845   Class Time Calculation (min) 31 min    Row Name 09/08/22 1100     Education   Cardiac Education Topics Pritikin   Select Core Videos     Core Videos   Educator Dietitian   Select Nutrition   Nutrition Overview of the Pritikin Eating Plan   Instruction Review Code 1- Verbalizes Understanding   Class Start Time 0815   Class Stop Time 0850   Class Time Calculation (min) 35 min    Row Name 09/10/22 1100     Education   Cardiac Education Topics Pritikin   Orthoptist   Educator Dietitian   Weekly Topic Fast and Healthy Breakfasts   Instruction Review Code 1- Verbalizes Understanding   Class Start Time 0815   Class Stop Time 0847   Class Time Calculation (min) 32 min    Row Name 09/12/22 0800     Education   Cardiac Education Topics Pritikin   Nurse, children's   Educator Exercise Physiologist   Select Psychosocial   Psychosocial Healthy Minds, Bodies, Hearts   Instruction Review Code 1- Verbalizes Understanding   Class Start Time 0813   Class Stop Time 0845   Class Time Calculation  (min) 32 min    Row Name 09/17/22 1000     Education   Cardiac Education Topics Pritikin   Secondary school teacher School   Educator Dietitian   Weekly Topic Personalizing Your Pritikin Plate   Instruction Review Code 1- Verbalizes Understanding   Class Start Time 0815   Class Stop Time 0848   Class Time Calculation (min) 33 min    Row Name 09/19/22 0900     Education   Cardiac Education Topics Pritikin   [employer]  Workshops   Biomedical scientist Exercise   Exercise Workshop Location manager and Fall Prevention   Instruction Review Code 1- Verbalizes Understanding   Class Start Time [phone]   Class Stop Time 0856   Class Time Calculation (min) 45 min    Row Name 09/22/22 0900     Education   Cardiac Education Topics Pritikin   Glass blower/designer Nutrition   Nutrition Workshop Label Reading   Instruction Review Code 1- Verbalizes Understanding   Class Start Time 0815   Class Stop Time 0904   Class Time Calculation (min) 49 min    Row Name 09/24/22 1100     Education   Cardiac Education Topics Pritikin   Customer service manager   Weekly Topic [employer] Desserts   Instruction Review Code 1- Verbalizes Understanding   Class Start Time 0820   Class Stop Time 0900   Class Time Calculation (min) 40 min    Row Name 09/26/22 1000     Education   Cardiac Education Topics Pritikin   Select Core Videos     Core Videos   Educator Dietitian   Select Nutrition   Nutrition Other  Label Reading   Instruction Review Code 1- Verbalizes Understanding   Class Start Time 0815   Class Stop Time 0900   Class Time Calculation (min) 45 min    Row Name 09/29/22 1100     Education   Cardiac Education Topics Pritikin   Select Workshops     Workshops   Educator Exercise Physiologist   Select Psychosocial   Psychosocial Workshop Recognizing and  Reducing Stress   Instruction Review Code 1- Verbalizes Understanding   Class Start Time 0815   Class Stop Time 0900   Class Time Calculation (min) 45 min    Row Name 10/01/22 1000     Education   Cardiac Education Topics Pritikin   Secondary school teacher School   Educator Dietitian   Weekly Topic Tasty Appetizers and Snacks   Instruction Review Code 1- Verbalizes Understanding   Class Start Time 0815   Class Stop Time 0900   Class Time Calculation (min) 45 min    Row Name 10/03/22 1300     Education   Cardiac Education Topics Pritikin   Nurse, children's Exercise Physiologist   Select Nutrition   Nutrition Calorie Density   Instruction Review Code 1- Verbalizes Understanding   Class Start Time [phone]   Class Stop Time 0900   Class Time Calculation (min) 44 min    Row Name 10/06/22 0900     Education   Cardiac Education Topics Pritikin   Select Workshops     Workshops   Educator Exercise Physiologist   Select Exercise   Exercise Workshop Exercise Basics: Building Your Action Plan   Instruction Review Code 1- Verbalizes Understanding   Class Start Time 0813   Class Stop Time 0900   Class Time Calculation (min) 47 min            Core Videos: Exercise    Move It!  Clinical staff conducted group or individual video education with verbal and written material and guidebook.  Patient learns the recommended Pritikin exercise program. Exercise with the goal of living a long, healthy life. Some of the health benefits  of exercise include controlled diabetes, healthier blood pressure levels, improved cholesterol levels, improved heart and lung capacity, improved sleep, and better body composition. Everyone should speak with their doctor before starting or changing an exercise routine.  Biomechanical Limitations Clinical staff conducted group or individual video education with verbal and written material and guidebook.  Patient  learns how biomechanical limitations can impact exercise and how we can mitigate and possibly overcome limitations to have an impactful and balanced exercise routine.  Body Composition Clinical staff conducted group or individual video education with verbal and written material and guidebook.  Patient learns that body composition (ratio of muscle mass to fat mass) is a key component to assessing overall fitness, rather than body weight alone. Increased fat mass, especially visceral belly fat, can put [place] at increased risk for metabolic syndrome, type 2 diabetes, heart disease, and even death. It is recommended to combine diet and exercise (cardiovascular and resistance training) to improve your body composition. Seek guidance from your physician and exercise physiologist before implementing an exercise routine.  Exercise Action Plan Clinical staff conducted group or individual video education with verbal and written material and guidebook.  Patient learns the recommended strategies to achieve and enjoy long-term exercise adherence, including variety, self-motivation, self-efficacy, and positive decision making. Benefits of exercise include fitness, good health, weight management, more energy, better sleep, less stress, and overall well-being.  Medical   Heart Disease Risk Reduction Clinical staff conducted group or individual video education with verbal and written material and guidebook.  Patient learns our heart is our most vital organ as it circulates oxygen, nutrients, white blood cells, and hormones throughout the entire body, and carries waste away. Data supports a plant-based eating plan like the Pritikin Program for its effectiveness in slowing progression of and reversing heart disease. The video provides a number of recommendations to address heart disease.   Metabolic Syndrome and Belly Fat  Clinical staff conducted group or individual video education with verbal and written material and  guidebook.  Patient learns what metabolic syndrome is, how it leads to heart disease, and how one can reverse it and keep it from coming back. You have metabolic syndrome if you have 3 of the following 5 criteria: abdominal obesity, high blood pressure, high triglycerides, low HDL cholesterol, and high blood sugar.  Hypertension and Heart Disease Clinical staff conducted group or individual video education with verbal and written material and guidebook.  Patient learns that high blood pressure, or hypertension, is very common in the [place]. Hypertension is largely due to excessive salt intake, but other important risk factors include being overweight, physical inactivity, drinking too much alcohol, smoking, and not eating enough potassium from fruits and vegetables. High blood pressure is a leading risk factor for heart attack, stroke, congestive heart failure, dementia, kidney failure, and premature death. Long-term effects of excessive salt intake include stiffening of the arteries and thickening of heart muscle and organ damage. Recommendations include ways to reduce hypertension and the risk of heart disease.  Diseases of Our Time - Focusing on Diabetes Clinical staff conducted group or individual video education with verbal and written material and guidebook.  Patient learns why the best way to stop diseases of our time is prevention, through food and other lifestyle changes. Medicine (such as prescription pills and surgeries) is often only a Band-Aid on the problem, not a long-term solution. Most common diseases of our time include obesity, type 2 diabetes, hypertension, heart disease, and cancer. The Pritikin Program is  recommended and has been proven to help reduce, reverse, and/or prevent the damaging effects of metabolic syndrome.  Nutrition   Overview of the Pritikin Eating Plan  Clinical staff conducted group or individual video education with verbal and written material and  guidebook.  Patient learns about the Pritikin Eating Plan for disease risk reduction. The Pritikin Eating Plan emphasizes a wide variety of unrefined, minimally-processed carbohydrates, like fruits, vegetables, whole grains, and legumes. Go, Caution, and Stop food choices are explained. Plant-based and lean animal proteins are emphasized. Rationale provided for low sodium intake for blood pressure control, low added sugars for blood sugar stabilization, and low added fats and oils for coronary artery disease risk reduction and weight management.  Calorie Density  Clinical staff conducted group or individual video education with verbal and written material and guidebook.  Patient learns about calorie density and how it impacts the Pritikin Eating Plan. Knowing the characteristics of the food you choose will help you decide whether those foods will lead to weight gain or weight loss, and whether you want to consume more or less of them. Weight loss is usually a side effect of the Pritikin Eating Plan because of its focus on low calorie-dense foods.  Label Reading  Clinical staff conducted group or individual video education with verbal and written material and guidebook.  Patient learns about the Pritikin recommended label reading guidelines and corresponding recommendations regarding calorie density, added sugars, sodium content, and whole grains.  Dining Out - Part 1  Clinical staff conducted group or individual video education with verbal and written material and guidebook.  Patient learns that restaurant meals can be sabotaging because they can be so high in calories, fat, sodium, and/or sugar. Patient learns recommended strategies on how to positively address this and avoid unhealthy pitfalls.  Facts on Fats  Clinical staff conducted group or individual video education with verbal and written material and guidebook.  Patient learns that lifestyle modifications can be just as effective, if not  more so, as many medications for lowering your risk of heart disease. A Pritikin lifestyle can help to reduce your risk of inflammation and atherosclerosis (cholesterol build-up, or plaque, in the artery walls). Lifestyle interventions such as dietary choices and physical activity address the cause of atherosclerosis. A review of the types of fats and their impact on blood cholesterol levels, along with dietary recommendations to reduce fat intake is also included.  Nutrition Action Plan  Clinical staff conducted group or individual video education with verbal and written material and guidebook.  Patient learns how to incorporate Pritikin recommendations into their lifestyle. Recommendations include planning and keeping personal health goals in mind as an important part of their success.  Healthy Mind-Set    Healthy Minds, Bodies, Hearts  Clinical staff conducted group or individual video education with verbal and written material and guidebook.  Patient learns how to identify when they are stressed. Video will discuss the impact of that stress, as well as the many benefits of stress management. Patient will also be introduced to stress management techniques. The way we think, act, and feel has an impact on our hearts.  How Our Thoughts Can Heal Our Hearts  Clinical staff conducted group or individual video education with verbal and written material and guidebook.  Patient learns that negative thoughts can cause depression and anxiety. This can result in negative lifestyle behavior and serious health problems. Cognitive behavioral therapy is an effective method to help control our thoughts in order to change and  improve our emotional outlook.  Additional Videos:  Exercise    Improving Performance  Clinical staff conducted group or individual video education with verbal and written material and guidebook.  Patient learns to use a non-linear approach by alternating intensity levels and lengths of  time spent exercising to help burn more calories and lose more body fat. Cardiovascular exercise helps improve heart health, metabolism, hormonal balance, blood sugar control, and recovery from fatigue. Resistance training improves strength, endurance, balance, coordination, reaction time, metabolism, and muscle mass. Flexibility exercise improves circulation, posture, and balance. Seek guidance from your physician and exercise physiologist before implementing an exercise routine and learn your capabilities and proper form for all exercise.  Introduction to Yoga  Clinical staff conducted group or individual video education with verbal and written material and guidebook.  Patient learns about yoga, a discipline of the coming together of mind, breath, and body. The benefits of yoga include improved flexibility, improved range of motion, better posture and core strength, increased lung function, weight loss, and positive self-image. Yoga's heart health benefits include lowered blood pressure, healthier heart rate, decreased cholesterol and triglyceride levels, improved immune function, and reduced stress. Seek guidance from your physician and exercise physiologist before implementing an exercise routine and learn your capabilities and proper form for all exercise.  Medical   Aging: Enhancing Your Quality of Life  Clinical staff conducted group or individual video education with verbal and written material and guidebook.  Patient learns key strategies and recommendations to stay in good physical health and enhance quality of life, such as prevention strategies, having an advocate, securing a Health Care Proxy and Power of Attorney, and keeping a list of medications and system for tracking them. It also discusses how to avoid risk for bone loss.  Biology of Weight Control  Clinical staff conducted group or individual video education with verbal and written material and guidebook.  Patient learns that weight  gain occurs because we consume more calories than we burn (eating more, moving less). Even if your body weight is normal, you may have higher ratios of fat compared to muscle mass. Too much body fat puts you at increased risk for cardiovascular disease, heart attack, stroke, type 2 diabetes, and obesity-related cancers. In addition to exercise, following the Pritikin Eating Plan can help reduce your risk.  Decoding Lab Results  Clinical staff conducted group or individual video education with verbal and written material and guidebook.  Patient learns that lab test reflects one measurement whose values change over time and are influenced by many factors, including medication, stress, sleep, exercise, food, hydration, pre-existing medical conditions, and more. It is recommended to use the knowledge from this video to become more involved with your lab results and evaluate your numbers to speak with your doctor.   Diseases of Our Time - Overview  Clinical staff conducted group or individual video education with verbal and written material and guidebook.  Patient learns that according to the CDC, 50% to 70% of chronic diseases (such as obesity, type 2 diabetes, elevated lipids, hypertension, and heart disease) are avoidable through lifestyle improvements including healthier food choices, listening to satiety cues, and increased physical activity.  Sleep Disorders Clinical staff conducted group or individual video education with verbal and written material and guidebook.  Patient learns how good quality and duration of sleep are important to overall health and well-being. Patient also learns about sleep disorders and how they impact health along with recommendations to address them, including discussing with a  physician.  Nutrition  Dining Out - Part 2 Clinical staff conducted group or individual video education with verbal and written material and guidebook.  Patient learns how to plan ahead and  communicate in order to maximize their dining experience in a healthy and nutritious manner. Included are recommended food choices based on the type of restaurant the patient is visiting.   Fueling a Banker conducted group or individual video education with verbal and written material and guidebook.  There is a strong connection between our food choices and our health. Diseases like obesity and type 2 diabetes are very prevalent and are in large-part due to lifestyle choices. The Pritikin Eating Plan provides plenty of food and hunger-curbing satisfaction. It is easy to follow, affordable, and helps reduce health risks.  Menu Workshop  Clinical staff conducted group or individual video education with verbal and written material and guidebook.  Patient learns that restaurant meals can sabotage health goals because they are often packed with calories, fat, sodium, and sugar. Recommendations include strategies to plan ahead and to communicate with the manager, chef, or server to help order a healthier meal.  Planning Your Eating Strategy  Clinical staff conducted group or individual video education with verbal and written material and guidebook.  Patient learns about the Pritikin Eating Plan and its benefit of reducing the risk of disease. The Pritikin Eating Plan does not focus on calories. Instead, it emphasizes high-quality, nutrient-rich foods. By knowing the characteristics of the foods, we choose, we can determine their calorie density and make informed decisions.  Targeting Your Nutrition Priorities  Clinical staff conducted group or individual video education with verbal and written material and guidebook.  Patient learns that lifestyle habits have a tremendous impact on disease risk and progression. This video provides eating and physical activity recommendations based on your personal health goals, such as reducing LDL cholesterol, losing weight, preventing or controlling  type 2 diabetes, and reducing high blood pressure.  Vitamins and Minerals  Clinical staff conducted group or individual video education with verbal and written material and guidebook.  Patient learns different ways to obtain key vitamins and minerals, including through a recommended healthy diet. It is important to discuss all supplements you take with your doctor.   Healthy Mind-Set    Smoking Cessation  Clinical staff conducted group or individual video education with verbal and written material and guidebook.  Patient learns that cigarette smoking and tobacco addiction pose a serious health risk which affects millions of people. Stopping smoking will significantly reduce the risk of heart disease, lung disease, and many forms of cancer. Recommended strategies for quitting are covered, including working with your doctor to develop a successful plan.  Culinary   Becoming a Set designer conducted group or individual video education with verbal and written material and guidebook.  Patient learns that cooking at home can be healthy, cost-effective, quick, and puts them in control. Keys to cooking healthy recipes will include looking at your recipe, assessing your equipment needs, planning ahead, making it simple, choosing cost-effective seasonal ingredients, and limiting the use of added fats, salts, and sugars.  Cooking - Breakfast and Snacks  Clinical staff conducted group or individual video education with verbal and written material and guidebook.  Patient learns how important breakfast is to satiety and nutrition through the entire day. Recommendations include key foods to eat during breakfast to help stabilize blood sugar levels and to prevent overeating at meals later in the  day. Planning ahead is also a key component.  Cooking - Educational psychologist conducted group or individual video education with verbal and written material and guidebook.  Patient learns  eating strategies to improve overall health, including an approach to cook more at home. Recommendations include thinking of animal protein as a side on your plate rather than center stage and focusing instead on lower calorie dense options like vegetables, fruits, whole grains, and plant-based proteins, such as beans. Making sauces in large quantities to freeze for later and leaving the skin on your vegetables are also recommended to maximize your experience.  Cooking - Healthy Salads and Dressing Clinical staff conducted group or individual video education with verbal and written material and guidebook.  Patient learns that vegetables, fruits, whole grains, and legumes are the foundations of the Pritikin Eating Plan. Recommendations include how to incorporate each of these in flavorful and healthy salads, and how to create homemade salad dressings. Proper handling of ingredients is also covered. Cooking - Soups and [employer] - Soups and Desserts Clinical staff conducted group or individual video education with verbal and written material and guidebook.  Patient learns that Pritikin soups and desserts make for easy, nutritious, and delicious snacks and meal components that are low in sodium, fat, sugar, and calorie density, while high in vitamins, minerals, and filling fiber. Recommendations include simple and healthy ideas for soups and desserts.   Overview     The Pritikin Solution Program Overview Clinical staff conducted group or individual video education with verbal and written material and guidebook.  Patient learns that the results of the Pritikin Program have been documented in more than 100 articles published in peer-reviewed journals, and the benefits include reducing risk factors for (and, in some cases, even reversing) high cholesterol, high blood pressure, type 2 diabetes, obesity, and more! An overview of the three key pillars of the Pritikin Program will be covered: eating well,  doing regular exercise, and having a healthy mind-set.  WORKSHOPS  Exercise: Exercise Basics: Building Your Action Plan Clinical staff led group instruction and group discussion with PowerPoint presentation and patient guidebook. To enhance the learning environment the use of posters, models and videos may be added. At the conclusion of this workshop, patients will comprehend the difference between physical activity and exercise, as well as the benefits of incorporating both, into their routine. Patients will understand the FITT (Frequency, Intensity, Time, and Type) principle and how to use it to build an exercise action plan. In addition, safety concerns and other considerations for exercise and cardiac rehab will be addressed by the presenter. The purpose of this lesson is to promote a comprehensive and effective weekly exercise routine in order to improve patients' overall level of fitness.   Managing Heart Disease: Your Path to a Healthier Heart Clinical staff led group instruction and group discussion with PowerPoint presentation and patient guidebook. To enhance the learning environment the use of posters, models and videos may be added.At the conclusion of this workshop, patients will understand the anatomy and physiology of the heart. Additionally, they will understand how Pritikin's three pillars impact the risk factors, the progression, and the management of heart disease.  The purpose of this lesson is to provide a high-level overview of the heart, heart disease, and how the Pritikin lifestyle positively impacts risk factors.  Exercise Biomechanics Clinical staff led group instruction and group discussion with PowerPoint presentation and patient guidebook. To enhance the learning environment the use of  posters, models and videos may be added. Patients will learn how the structural parts of their bodies function and how these functions impact their daily activities, movement, and  exercise. Patients will learn how to promote a neutral spine, learn how to manage pain, and identify ways to improve their physical movement in order to promote healthy living. The purpose of this lesson is to expose patients to common physical limitations that impact physical activity. Participants will learn practical ways to adapt and manage aches and pains, and to minimize their effect on regular exercise. Patients will learn how to maintain good posture while sitting, walking, and lifting.  Balance Training and Fall Prevention  Clinical staff led group instruction and group discussion with PowerPoint presentation and patient guidebook. To enhance the learning environment the use of posters, models and videos may be added. At the conclusion of this workshop, patients will understand the importance of their sensorimotor skills (vision, proprioception, and the vestibular system) in maintaining their ability to balance as they age. Patients will apply a variety of balancing exercises that are appropriate for their current level of function. Patients will understand the common causes for poor balance, possible solutions to these problems, and ways to modify their physical environment in order to minimize their fall risk. The purpose of this lesson is to teach patients about the importance of maintaining balance as they age and ways to minimize their risk of falling.  WORKSHOPS   Nutrition:  Fueling a Ship broker led group instruction and group discussion with PowerPoint presentation and patient guidebook. To enhance the learning environment the use of posters, models and videos may be added. Patients will review the foundational principles of the Pritikin Eating Plan and understand what constitutes a serving size in each of the food groups. Patients will also learn Pritikin-friendly foods that are better choices when away from home and review make-ahead meal and snack options.  Calorie density will be reviewed and applied to three nutrition priorities: weight maintenance, weight loss, and weight gain. The purpose of this lesson is to reinforce (in a group setting) the key concepts around what patients are recommended to eat and how to apply these guidelines when away from home by planning and selecting Pritikin-friendly options. Patients will understand how calorie density may be adjusted for different weight management goals.  Mindful Eating  Clinical staff led group instruction and group discussion with PowerPoint presentation and patient guidebook. To enhance the learning environment the use of posters, models and videos may be added. Patients will briefly review the concepts of the Pritikin Eating Plan and the importance of low-calorie dense foods. The concept of mindful eating will be introduced as well as the importance of paying attention to internal hunger signals. Triggers for non-hunger eating and techniques for dealing with triggers will be explored. The purpose of this lesson is to provide patients with the opportunity to review the basic principles of the Pritikin Eating Plan, discuss the value of eating mindfully and how to measure internal cues of hunger and fullness using the Hunger Scale. Patients will also discuss reasons for non-hunger eating and learn strategies to use for controlling emotional eating.  Targeting Your Nutrition Priorities Clinical staff led group instruction and group discussion with PowerPoint presentation and patient guidebook. To enhance the learning environment the use of posters, models and videos may be added. Patients will learn how to determine their genetic susceptibility to disease by reviewing their family history. Patients will gain insight into the importance  of diet as part of an overall healthy lifestyle in mitigating the impact of genetics and other environmental insults. The purpose of this lesson is to provide patients with the  opportunity to assess their personal nutrition priorities by looking at their family history, their own health history and current risk factors. Patients will also be able to discuss ways of prioritizing and modifying the Pritikin Eating Plan for their highest risk areas  Menu  Clinical staff led group instruction and group discussion with PowerPoint presentation and patient guidebook. To enhance the learning environment the use of posters, models and videos may be added. Using menus brought in from [employer], or printed from [employer], patients will apply the Pritikin dining out guidelines that were presented in the [employer] video. Patients will also be able to practice these guidelines in a variety of provided scenarios. The purpose of this lesson is to provide patients with the opportunity to practice hands-on learning of the Pritikin Dining Out guidelines with actual menus and practice scenarios.  Label Reading Clinical staff led group instruction and group discussion with PowerPoint presentation and patient guidebook. To enhance the learning environment the use of posters, models and videos may be added. Patients will review and discuss the Pritikin label reading guidelines presented in Pritikin's Label Reading Educational series video. Using fool labels brought in from local grocery stores and markets, patients will apply the label reading guidelines and determine if the packaged food meet the Pritikin guidelines. The purpose of this lesson is to provide patients with the opportunity to review, discuss, and practice hands-on learning of the Pritikin Label Reading guidelines with actual packaged food labels. Cooking School  Pritikin's [employer] are designed to teach patients ways to prepare quick, simple, and affordable recipes at home. The importance of nutrition's role in chronic disease risk reduction is reflected in its emphasis in the overall  Pritikin program. By learning how to prepare essential core Pritikin Eating Plan recipes, patients will increase control over what they eat; be able to customize the flavor of foods without the use of added salt, sugar, or fat; and improve the quality of the food they consume. By learning a set of core recipes which are easily assembled, quickly prepared, and affordable, patients are more likely to prepare more healthy foods at home. These workshops focus on convenient breakfasts, simple entres, side dishes, and desserts which can be prepared with minimal effort and are consistent with nutrition recommendations for cardiovascular risk reduction. Cooking [employer] are taught by a Armed forces logistics/support/administrative officer (RD) who has been trained by the [employer]. The chef or RD has a clear understanding of the importance of minimizing - if not completely eliminating - added fat, sugar, and sodium in recipes. Throughout the series of Cooking School Workshop sessions, patients will learn about healthy ingredients and efficient methods of cooking to build confidence in their capability to prepare    Cooking School weekly topics:  Adding Flavor- Sodium-Free  Fast and Healthy Breakfasts  Powerhouse Plant-Based Proteins  Satisfying Salads and Dressings  Simple Sides and Sauces  International Cuisine-Spotlight on the [employer] Zones  Delicious Desserts  Savory Soups  [employer] - Meals in a Astronomer Appetizers and Snacks  Comforting Weekend Breakfasts  One-Pot Wonders   Fast Evening Meals  Landscape architect Your Pritikin Plate  WORKSHOPS   Healthy Mindset (Psychosocial):  Focused Goals, Sustainable Changes Clinical staff led group instruction and group discussion  with PowerPoint presentation and patient guidebook. To enhance the learning environment the use of posters, models and videos may be added. Patients will be able to apply effective goal setting strategies  to establish at least one personal goal, and then take consistent, meaningful action toward that goal. They will learn to identify common barriers to achieving personal goals and develop strategies to overcome them. Patients will also gain an understanding of how our mind-set can impact our ability to achieve goals and the importance of cultivating a positive and growth-oriented mind-set. The purpose of this lesson is to provide patients with a deeper understanding of how to set and achieve personal goals, as well as the tools and strategies needed to overcome common obstacles which may arise along the way.  From Head to Heart: The Power of a Healthy Outlook  Clinical staff led group instruction and group discussion with PowerPoint presentation and patient guidebook. To enhance the learning environment the use of posters, models and videos may be added. Patients will be able to recognize and describe the impact of emotions and mood on physical health. They will discover the importance of self-care and explore self-care practices which may work for them. Patients will also learn how to utilize the 4 C's to cultivate a healthier outlook and better manage stress and challenges. The purpose of this lesson is to demonstrate to patients how a healthy outlook is an essential part of maintaining good health, especially as they continue their cardiac rehab journey.  Healthy Sleep for a Healthy Heart Clinical staff led group instruction and group discussion with PowerPoint presentation and patient guidebook. To enhance the learning environment the use of posters, models and videos may be added. At the conclusion of this workshop, patients will be able to demonstrate knowledge of the importance of sleep to overall health, well-being, and quality of life. They will understand the symptoms of, and treatments for, common sleep disorders. Patients will also be able to identify daytime and nighttime behaviors which impact  sleep, and they will be able to apply these tools to help manage sleep-related challenges. The purpose of this lesson is to provide patients with a general overview of sleep and outline the importance of quality sleep. Patients will learn about a few of the most common sleep disorders. Patients will also be introduced to the concept of "sleep hygiene," and discover ways to self-manage certain sleeping problems through simple daily behavior changes. Finally, the workshop will motivate patients by clarifying the links between quality sleep and their goals of heart-healthy living.   Recognizing and Reducing Stress Clinical staff led group instruction and group discussion with PowerPoint presentation and patient guidebook. To enhance the learning environment the use of posters, models and videos may be added. At the conclusion of this workshop, patients will be able to understand the types of stress reactions, differentiate between acute and chronic stress, and recognize the impact that chronic stress has on their health. They will also be able to apply different coping mechanisms, such as reframing negative self-talk. Patients will have the opportunity to practice a variety of stress management techniques, such as deep abdominal breathing, progressive muscle relaxation, and/or guided imagery.  The purpose of this lesson is to educate patients on the role of stress in their lives and to provide healthy techniques for coping with it.  Learning Barriers/Preferences:  Learning Barriers/Preferences - 07/22/22 1028       Learning Barriers/Preferences   Learning Barriers Sight;Hearing   wears glasses, hearing aids R&  L   Learning Preferences Group Instruction;Individual Instruction;Skilled Demonstration;Written Material;Computer/Internet             Education Topics:  Knowledge Questionnaire Score:  Knowledge Questionnaire Score - 07/22/22 1125       Knowledge Questionnaire Score   Pre Score 21/24              Core Components/Risk Factors/Patient Goals at Admission:  Personal Goals and Risk Factors at Admission - 07/22/22 1029       Core Components/Risk Factors/Patient Goals on Admission    Weight Management Yes;Weight Loss    Intervention Weight Management: Develop a combined nutrition and exercise program designed to reach desired caloric intake, while maintaining appropriate intake of nutrient and fiber, sodium and fats, and appropriate energy expenditure required for the weight goal.;Weight Management: Provide education and appropriate resources to help participant work on and attain dietary goals.;Weight Management/Obesity: Establish reasonable short term and long term weight goals.    Admit Weight 206 lb 12.7 oz (93.8 kg)    Expected Outcomes Short Term: Continue to assess and modify interventions until short term weight is achieved;Long Term: Adherence to nutrition and physical activity/exercise program aimed toward attainment of established weight goal;Weight Loss: Understanding of general recommendations for a balanced deficit meal plan, which promotes 1-2 lb weight loss per week and includes a negative energy balance of [phone] kcal/d;Understanding recommendations for meals to include 15-35% energy as protein, 25-35% energy from fat, 35-60% energy from carbohydrates, less than 200mg  of dietary cholesterol, 20-35 gm of total fiber daily;Understanding of distribution of calorie intake throughout the day with the consumption of 4-5 meals/snacks    Hypertension Yes    Intervention Provide education on lifestyle modifcations including regular physical activity/exercise, weight management, moderate sodium restriction and increased consumption of fresh fruit, vegetables, and low fat dairy, alcohol moderation, and smoking cessation.;Monitor prescription use compliance.    Expected Outcomes Short Term: Continued assessment and intervention until BP is < 140/103mm HG in hypertensive  participants. < 130/51mm HG in hypertensive participants with diabetes, heart failure or chronic kidney disease.;Long Term: Maintenance of blood pressure at goal levels.    Lipids Yes    Intervention Provide education and support for participant on nutrition & aerobic/resistive exercise along with prescribed medications to achieve LDL 70mg , HDL >40mg .    Expected Outcomes Short Term: Participant states understanding of desired cholesterol values and is compliant with medications prescribed. Participant is following exercise prescription and nutrition guidelines.;Long Term: Cholesterol controlled with medications as prescribed, with individualized exercise RX and with personalized nutrition plan. Value goals: LDL < 70mg , HDL > 40 mg.             Core Components/Risk Factors/Patient Goals Review:   Goals and Risk Factor Review     Row Name 09/09/22 0944 10/07/22 1143           Core Components/Risk Factors/Patient Goals Review   Personal Goals Review Weight Management/Obesity;Hypertension;Lipids Weight Management/Obesity;Hypertension;Lipids      Review [name] started is doing well with exercise at cardiac rehab. vital signs have been stable. No symptoms since return post ED visit on 09/03/22. Remains asymptomatic with intermittent bradycardia. [name] started is doing well with exercise at cardiac rehab. vital signs have been stable. No further bradycardia has been noted on recent sessions.      Expected Outcomes [name] will continue to participate in cardiac rehab for exercise, nutrition and lifestyle modifications [name] will continue to participate in cardiac rehab for exercise, nutrition and lifestyle modifications  Core Components/Risk Factors/Patient Goals at Discharge (Final Review):   Goals and Risk Factor Review - 10/07/22 1143       Core Components/Risk Factors/Patient Goals Review   Personal Goals Review Weight Management/Obesity;Hypertension;Lipids    Review [name]  started is doing well with exercise at cardiac rehab. vital signs have been stable. No further bradycardia has been noted on recent sessions.    Expected Outcomes [name] will continue to participate in cardiac rehab for exercise, nutrition and lifestyle modifications             ITP Comments:  ITP Comments     Row Name 07/22/22 0914 08/08/22 0953 09/09/22 0936 10/07/22 1140     ITP Comments Armanda Magic, MD:  Medical Director.  Intorduction to the [employer] / Intensive Cardiac Rehab.  Initial orientation packet reviewed with the patient. 30 Day ITP Review. Exercise is currently on hold until seen and cleared to exercise per [facility] cardiology. 30 Day ITP Review. [name] has returned to cardiac rehab per Dr Royann Shivers due to recent Ed visit due to bradycardia. [name] is doing well with exercise and has good attendance. 30 Day ITP Review. [name] has good attendance and participaiton in cardiac rehab.             Comments: See ITP comments.[name] RN BSN

## 2022-10-08 ENCOUNTER — Ambulatory Visit: Payer: Medicare PPO | Admitting: Internal Medicine

## 2022-10-08 ENCOUNTER — Encounter (HOSPITAL_COMMUNITY)
Admission: RE | Admit: 2022-10-08 | Discharge: 2022-10-08 | Disposition: A | Discharge status: Home / Self Care | Payer: Medicare PPO | Source: Ambulatory Visit | Attending: Cardiovascular Disease

## 2022-10-08 ENCOUNTER — Ambulatory Visit (INDEPENDENT_AMBULATORY_CARE_PROVIDER_SITE_OTHER): Payer: Medicare PPO

## 2022-10-08 ENCOUNTER — Encounter: Payer: Self-pay | Admitting: Internal Medicine

## 2022-10-08 VITALS — BP 126/76 | HR 75 | Temp 98.1°F | Ht 71.5 in | Wt 208.0 lb

## 2022-10-08 DIAGNOSIS — I252 Old myocardial infarction: Secondary | ICD-10-CM | POA: Diagnosis not present

## 2022-10-08 DIAGNOSIS — S4991XA Unspecified injury of right shoulder and upper arm, initial encounter: Secondary | ICD-10-CM

## 2022-10-08 DIAGNOSIS — I4892 Unspecified atrial flutter: Secondary | ICD-10-CM | POA: Diagnosis not present

## 2022-10-08 DIAGNOSIS — R7989 Other specified abnormal findings of blood chemistry: Secondary | ICD-10-CM | POA: Diagnosis not present

## 2022-10-08 DIAGNOSIS — Z955 Presence of coronary angioplasty implant and graft: Secondary | ICD-10-CM | POA: Diagnosis not present

## 2022-10-08 DIAGNOSIS — I10 Essential (primary) hypertension: Secondary | ICD-10-CM | POA: Diagnosis not present

## 2022-10-08 DIAGNOSIS — Z48812 Encounter for surgical aftercare following surgery on the circulatory system: Secondary | ICD-10-CM | POA: Diagnosis not present

## 2022-10-08 DIAGNOSIS — R001 Bradycardia, unspecified: Secondary | ICD-10-CM | POA: Diagnosis not present

## 2022-10-08 DIAGNOSIS — I214 Non-ST elevation (NSTEMI) myocardial infarction: Secondary | ICD-10-CM

## 2022-10-08 DIAGNOSIS — Z043 Encounter for examination and observation following other accident: Secondary | ICD-10-CM | POA: Diagnosis not present

## 2022-10-08 NOTE — Progress Notes (Signed)
Subjective:  Patient ID: Francisco Larson, male    DOB: 1935/04/26  Age: 87 y.o. MRN: [id number]  CC: Coronary Artery Disease and Atrial Fibrillation   HPI [name] presents for f/up ---   Discussed the use of AI scribe software for clinical note transcription with the patient, who gave verbal consent to proceed.  History of Present Illness   The patient, with a history of atrial fibrillation, presented with an episode of chest pressure along the bottom of the ribcage while working out on June 13th. The pressure was not severe, but it was enough to cause the patient to stop his workout. The patient also experienced a brief episode of nausea while walking home, which resolved within a minute. After showering, the patient felt well enough to meet friends for lunch. However, on the drive home, the chest pressure returned significantly, leading to an episode of vomiting. The patient was then taken to urgent care and subsequently to the ER.  The patient has since completed seven to eight weeks of cardiac rehab, with sessions on Monday, Wednesday, and Friday mornings. He denies any chest pain, shortness of breath, fatigue, or dizziness. He occasionally experiences a minor 'ping' sensation in the chest, but it quickly resolves and has not necessitated additional medication.  The patient also reported two falls over the summer. The first occurred a couple of weeks before the heart attack, where he stubbed his shin on a chair and fell, resulting in a sore wrist for a couple of weeks. The second fall occurred around August 1st, just before starting cardiac rehab. The patient slipped and fell forward, landing on his knee and possibly his arm. The skin on the knee was abraded, and the ribs were sore for a couple of days. The wrist was sore again for a couple of weeks, but the pain resolved. However, the patient has been experiencing persistent shoulder pain since the fall.  The patient also  reported occasional irregular heartbeats, with a pulse rate sometimes dropping to 38 or 40 during cardiac rehab sessions. This led to one instance where 911 was called, and the patient was taken to the ER. He was monitored and eventually discharged with the reassurance that he was okay. The patient was informed that he might need a pacemaker in the future due to these episodes.       Outpatient Medications Prior to Visit  Medication Sig Dispense Refill   amLODipine (NORVASC) 5 MG tablet Take 1 tablet (5 mg total) by mouth daily. 30 tablet 3   aspirin EC (ASPIRIN LOW DOSE) 81 MG tablet TAKE 1 TABLET (81 MG TOTAL) BY MOUTH DAILY. SWALLOW WHOLE. 30 tablet 10   atorvastatin (LIPITOR) 80 MG tablet TAKE 1 TABLET BY MOUTH EVERY DAY 90 tablet 3   carvedilol (COREG) 3.125 MG tablet Take 1 tablet (3.125 mg total) by mouth 2 (two) times daily with a meal. 60 tablet 2   clopidogrel (PLAVIX) 75 MG tablet TAKE 1 TABLET BY MOUTH EVERY DAY 90 tablet 3   Multiple Vitamins-Minerals (CENTRUM SILVER PO) Take 1 tablet by mouth daily with breakfast.     nitroGLYCERIN (NITROSTAT) 0.4 MG SL tablet Place 1 tablet (0.4 mg total) under the tongue every 5 (five) minutes as needed for chest pain. 25 tablet 3   TYLENOL 325 MG tablet Take 325-650 mg by mouth every 6 (six) hours as needed for mild pain or headache.     No facility-administered medications prior to visit.  ROS Review of Systems  Constitutional: Negative.  Negative for chills, diaphoresis, fatigue and fever.  HENT: Negative.  Negative for trouble swallowing.   Eyes: Negative.   Respiratory:  Negative for cough, chest tightness, shortness of breath and wheezing.   Cardiovascular:  Negative for chest pain, palpitations and leg swelling.  Gastrointestinal:  Negative for abdominal pain, constipation, diarrhea, nausea and vomiting.  Endocrine: Negative for cold intolerance and heat intolerance.  Genitourinary: Negative.  Negative for difficulty urinating.   Musculoskeletal:  Positive for arthralgias. Negative for myalgias and neck pain.  Skin: Negative.   Neurological: Negative.  Negative for dizziness and weakness.  Hematological:  Negative for adenopathy. Does not bruise/bleed easily.  Psychiatric/Behavioral: Negative.      Objective:  BP 126/76 (BP Location: Left Arm, Patient Position: Sitting, Cuff Size: Large)   Pulse 75   Temp 98.1 F (36.7 C) (Oral)   Ht 5' 11.5" (1.816 m)   Wt 208 lb (94.3 kg)   SpO2 97%   BMI 28.61 kg/m   BP Readings from Last 3 Encounters:  10/08/22 126/76  09/03/22 (!) 148/70  08/15/22 124/60    Wt Readings from Last 3 Encounters:  10/08/22 208 lb (94.3 kg)  08/15/22 207 lb 9.6 oz (94.2 kg)  08/12/22 205 lb (93 kg)    Physical Exam Vitals reviewed.  Constitutional:      Appearance: Normal appearance.  Eyes:     General: No scleral icterus.    Pupils: Pupils are equal, round, and reactive to light.  Cardiovascular:     Rate and Rhythm: Normal rate and regular rhythm.     Heart sounds: Murmur heard.     Systolic murmur is present with a grade of 3/6.     No diastolic murmur is present.     No gallop.  Pulmonary:     Effort: Pulmonary effort is normal.     Breath sounds: No stridor. No wheezing, rhonchi or rales.  Abdominal:     General: Abdomen is flat.     Palpations: There is no mass.     Tenderness: There is no abdominal tenderness. There is no guarding.     Hernia: No hernia is present.  Musculoskeletal:        General: Normal range of motion.     Right shoulder: No swelling, deformity or bony tenderness.     Left shoulder: Normal.     Cervical back: Neck supple.     Right lower leg: No edema.     Left lower leg: No edema.     Comments: Abn rotator cuff testing  Lymphadenopathy:     Cervical: No cervical adenopathy.  Skin:    General: Skin is warm and dry.  Neurological:     General: No focal deficit present.     Mental Status: He is alert.  Psychiatric:        Mood and  Affect: Mood normal.        Behavior: Behavior normal.     Lab Results  Component Value Date   WBC 21.7 (H) 09/03/2022   HGB 15.5 09/03/2022   HCT 46.6 09/03/2022   PLT 162 09/03/2022   GLUCOSE 96 09/03/2022   CHOL 105 09/23/2022   TRIG 86 09/23/2022   HDL 27 (L) 09/23/2022   LDLCALC 61 09/23/2022   ALT 29 09/23/2022   AST 27 09/23/2022   NA 137 09/03/2022   K 4.4 09/03/2022   CL 101 09/03/2022   CREATININE 1.29 (H) 09/03/2022  BUN 14 09/03/2022   CO2 27 09/03/2022   TSH 4.543 (H) 09/03/2022   PSA 3.38 03/28/2019   HGBA1C 5.7 (H) 06/26/2022    DG Shoulder Right  Result Date: 10/08/2022 CLINICAL DATA:  Fall EXAM: RIGHT SHOULDER - 2+ VIEW COMPARISON:  None Available. FINDINGS: There is no evidence of fracture or dislocation. There is no evidence of arthropathy or other focal bone abnormality. Soft tissues are unremarkable. IMPRESSION: Negative. Electronically Signed   By: Darliss Cheney M.D.   On: 10/08/2022 16:05     Assessment & Plan:   Right shoulder injury, initial encounter- Plain films are normal.  I am concerned about the rotator cuff. -     DG Shoulder Right; Future -     Ambulatory referral to Sports Medicine  Atrial flutter, unspecified type (HCC)- He has good rate and rhythm control.  TSH elevation- This is consistent with subclinical hypothyroidism.     Follow-up: Return in about 4 months (around 02/07/2023).  Sanda Linger, MD

## 2022-10-08 NOTE — Patient Instructions (Signed)
Shoulder Pain Many things can cause shoulder pain, including: An injury to the shoulder. Overuse of the shoulder. Arthritis. The source of the pain can be: Inflammation. An injury to the shoulder joint. An injury to a tendon, ligament, or bone. Follow these instructions at home: Pay attention to changes in your symptoms. Let your health care provider know about them. Follow these instructions to relieve your pain. If you have a removable sling: Wear the sling as told by your provider. Remove it only as told by your provider. Check the skin around the sling every day. Tell your provider about any concerns. Loosen the sling if your fingers tingle, become numb, or become cold. Keep the sling clean. If the sling is not waterproof: Do not let it get wet. Remove it to shower or bathe. Move your arm as little as possible, but keep your hand moving to prevent swelling. Managing pain, stiffness, and swelling  If told, put ice on the painful area. If you have a removable sling or immobilizer, remove it as told by your provider. Put ice in a plastic bag. Place a towel between your skin and the bag. Leave the ice on for 20 minutes, 2-3 times a day. If your skin turns bright red, remove the ice right away to prevent skin damage. The risk of damage is higher if you cannot feel pain, heat, or cold. Move your fingers often to reduce stiffness and swelling. Squeeze a soft ball or a foam pad as much as possible. This helps to keep the shoulder from swelling. It also helps to strengthen the arm. General instructions Take over-the-counter and prescription medicines only as told by your provider. Exercise may help with pain management. Perform exercises if told by your provider. You may be referred to a physical therapist to help in your recovery process. Keep all follow-up visits in order to avoid any type of permanent shoulder disability or chronic pain problems. Contact a health care provider  if: Your pain is not relieved with medicines. New pain develops in your arm, hand, or fingers. You loosen your sling and your arm, hand, or fingers remain tingly, numb, swollen, or painful. Get help right away if: Your arm, hand, or fingers turn white or blue. This information is not intended to replace advice given to you by your health care provider. Make sure you discuss any questions you have with your health care provider. Document Revised: 08/02/2021 Document Reviewed: 08/02/2021 Elsevier Patient Education  2024 [employer].  

## 2022-10-09 ENCOUNTER — Ambulatory Visit: Payer: Medicare PPO | Admitting: Orthopaedic Surgery

## 2022-10-09 DIAGNOSIS — M25511 Pain in right shoulder: Secondary | ICD-10-CM | POA: Diagnosis not present

## 2022-10-09 DIAGNOSIS — G8929 Other chronic pain: Secondary | ICD-10-CM | POA: Diagnosis not present

## 2022-10-09 NOTE — Addendum Note (Signed)
Addended by: Wendi Maya on: 10/09/2022 11:08 AM   Modules accepted: Orders

## 2022-10-09 NOTE — Progress Notes (Signed)
Carelink Summary Report / Loop Recorder 

## 2022-10-09 NOTE — Progress Notes (Signed)
Office Visit Note   Patient: Francisco Larson           Date of Birth: 1935-05-04           MRN: [id number] Visit Date: 10/09/2022              Requested by: Etta Grandchild, MD [address] [place],  [place] [address] PCP: Etta Grandchild, MD   Assessment & Plan: Visit Diagnoses:  1. Chronic right shoulder pain     Plan: Arun is a very pleasant 87 year old gentleman with chronic right shoulder pain status post mechanical fall.  Impression is rotator cuff strain and biceps tendon strain.  Pain has improved significantly.  He is not interested in cortisone injection at this time.  I will make a referral for him to do physical therapy at his facility.  He should follow-up in about 6 to 8 weeks if symptoms do not improve.  Follow-Up Instructions: No follow-ups on file.   Orders:  No orders of the defined types were placed in this encounter.  No orders of the defined types were placed in this encounter.     Procedures: No procedures performed   Clinical Data: No additional findings.   Subjective: Chief Complaint  Patient presents with   Right Shoulder - Pain    DOI 08/14/2022    HPI Francisco Larson is a very pleasant 87 year old gentleman who comes in for evaluation of chronic right shoulder pain status post mechanical fall on August 1.  He fell on outstretched hand and jammed his shoulder.  His pain has improved but he still notices it during ADLs.  He is able to play golf.  He lives at friend's home. Review of Systems  Constitutional: Negative.   HENT: Negative.    Eyes: Negative.   Respiratory: Negative.    Cardiovascular: Negative.   Gastrointestinal: Negative.   Endocrine: Negative.   Genitourinary: Negative.   Skin: Negative.   Allergic/Immunologic: Negative.   Neurological: Negative.   Hematological: Negative.   Psychiatric/Behavioral: Negative.    All other systems reviewed and are negative.    Objective: Vital Signs: There were no vitals taken  for this visit.  Physical Exam Vitals and nursing note reviewed.  Constitutional:      Appearance: He is well-developed.  HENT:     Head: Normocephalic and atraumatic.  Eyes:     Pupils: Pupils are equal, round, and reactive to light.  Pulmonary:     Effort: Pulmonary effort is normal.  Abdominal:     Palpations: Abdomen is soft.  Musculoskeletal:        General: Normal range of motion.     Cervical back: Neck supple.  Skin:    General: Skin is warm.  Neurological:     Mental Status: He is alert and oriented to person, place, and time.  Psychiatric:        Behavior: Behavior normal.        Thought Content: Thought content normal.        Judgment: Judgment normal.     Ortho Exam Exam of the right shoulder shows full active and passive range of motion.  He has minor pain with empty can and more significant pain with Speed test.  He is tender to the bicipital groove.  Infraspinatus and subscap are intact with excellent strength.  No impingement signs. Specialty Comments:  No specialty comments available.  Imaging: DG Shoulder Right  Result Date: 10/08/2022 CLINICAL DATA:  Fall EXAM: RIGHT SHOULDER -  2+ VIEW COMPARISON:  None Available. FINDINGS: There is no evidence of fracture or dislocation. There is no evidence of arthropathy or other focal bone abnormality. Soft tissues are unremarkable. IMPRESSION: Negative. Electronically Signed   By: Darliss Cheney M.D.   On: 10/08/2022 16:05     PMFS History: Patient Active Problem List   Diagnosis Date Noted   Right shoulder injury, initial encounter 10/08/2022   Paroxysmal atrial fibrillation (HCC) 06/27/2022   Atrial flutter (HCC) 11/29/2019   Stage 3a chronic kidney disease (HCC) 03/28/2019   Onychomycosis of great toe 03/18/2017   Prediabetes 03/19/2016   Routine general medical examination at a health care facility 03/19/2016   TSH elevation 03/17/2016   Benign prostatic hyperplasia without lower urinary tract symptoms  03/17/2016   Vitamin D deficiency 09/07/2014   HLD (hyperlipidemia) 01/27/2012   CLL (chronic lymphocytic leukemia) (HCC) 11/27/2011   Past Medical History:  Diagnosis Date   Arthritis    Coronary artery disease    Hyperlipidemia    Leukemia, chronic lymphocytic (HCC)    SCCA (squamous cell carcinoma) of skin 08/08/2020   Mid Frontal Scalp (in situ) (curet and 5FU)   SCCA (squamous cell carcinoma) of skin 08/08/2020   Mid Parietal Scalp (in situ) (curet and 5FU)   SCCA (squamous cell carcinoma) of skin 08/08/2020   Mid Supratip of Nose (in situ)   Shingles    Squamous cell carcinoma of skin 12/08/2018   right jaw line cis   Vitamin D deficiency     Family History  Problem Relation Age of Onset   Colon cancer Sister    Hyperlipidemia Sister    Stroke Sister    Heart disease Father    Emphysema Mother        never smoker, but exposed to 2nd hand from spouse   Cancer Mother    COPD Maternal Grandmother        never smoker   Stroke Maternal Grandfather    Diabetes Paternal Grandmother    Mental illness Paternal Grandmother    Asthma Sister     Past Surgical History:  Procedure Laterality Date   CARDIAC CATHETERIZATION     CARDIOVERSION N/A 12/16/2019   Procedure: CARDIOVERSION;  Surgeon: Thurmon Fair, MD;  Location: [facility] ENDOSCOPY;  Service: Cardiovascular;  Laterality: N/A;   CORONARY STENT INTERVENTION N/A 06/27/2022   Procedure: CORONARY STENT INTERVENTION;  Surgeon: Marykay Lex, MD;  Location: [facility] INVASIVE CV LAB;  Service: Cardiovascular;  Laterality: N/A;   EYE SURGERY     LAPAROSCOPIC TRANS ANAL AND TRANSABDOMINAL RECTAL RESECTION WITH COLOANAL ANASTOMOSIS     LEFT HEART CATH AND CORONARY ANGIOGRAPHY N/A 06/27/2022   Procedure: LEFT HEART CATH AND CORONARY ANGIOGRAPHY;  Surgeon: Marykay Lex, MD;  Location: [facility] INVASIVE CV LAB;  Service: Cardiovascular;  Laterality: N/A;   SURGERY OF LIP     tumor   TONSILLECTOMY     VASECTOMY     Social History    Occupational History   Occupation: Retired    Comment: Professor [employer]  Tobacco Use   Smoking status: Never   Smokeless tobacco: Never  Vaping Use   Vaping status: Never Used  Substance and Sexual Activity   Alcohol use: No   Drug use: No   Sexual activity: Not on file

## 2022-10-10 ENCOUNTER — Encounter (HOSPITAL_COMMUNITY)
Admission: RE | Admit: 2022-10-10 | Discharge: 2022-10-10 | Disposition: A | Discharge status: Home / Self Care | Payer: Medicare PPO | Source: Ambulatory Visit | Attending: Cardiovascular Disease

## 2022-10-10 DIAGNOSIS — Z955 Presence of coronary angioplasty implant and graft: Secondary | ICD-10-CM | POA: Diagnosis not present

## 2022-10-10 DIAGNOSIS — I214 Non-ST elevation (NSTEMI) myocardial infarction: Secondary | ICD-10-CM

## 2022-10-10 DIAGNOSIS — I252 Old myocardial infarction: Secondary | ICD-10-CM | POA: Diagnosis not present

## 2022-10-10 DIAGNOSIS — Z48812 Encounter for surgical aftercare following surgery on the circulatory system: Secondary | ICD-10-CM | POA: Diagnosis not present

## 2022-10-10 DIAGNOSIS — R001 Bradycardia, unspecified: Secondary | ICD-10-CM | POA: Diagnosis not present

## 2022-10-10 DIAGNOSIS — I10 Essential (primary) hypertension: Secondary | ICD-10-CM | POA: Diagnosis not present

## 2022-10-13 ENCOUNTER — Encounter (HOSPITAL_COMMUNITY)
Admission: RE | Admit: 2022-10-13 | Discharge: 2022-10-13 | Disposition: A | Discharge status: Home / Self Care | Payer: Medicare PPO | Source: Ambulatory Visit | Attending: Cardiovascular Disease

## 2022-10-13 DIAGNOSIS — Z955 Presence of coronary angioplasty implant and graft: Secondary | ICD-10-CM

## 2022-10-13 DIAGNOSIS — R001 Bradycardia, unspecified: Secondary | ICD-10-CM | POA: Diagnosis not present

## 2022-10-13 DIAGNOSIS — I10 Essential (primary) hypertension: Secondary | ICD-10-CM | POA: Diagnosis not present

## 2022-10-13 DIAGNOSIS — I252 Old myocardial infarction: Secondary | ICD-10-CM | POA: Diagnosis not present

## 2022-10-13 DIAGNOSIS — I214 Non-ST elevation (NSTEMI) myocardial infarction: Secondary | ICD-10-CM

## 2022-10-13 DIAGNOSIS — Z48812 Encounter for surgical aftercare following surgery on the circulatory system: Secondary | ICD-10-CM | POA: Diagnosis not present

## 2022-10-15 ENCOUNTER — Encounter (HOSPITAL_COMMUNITY)
Admission: RE | Admit: 2022-10-15 | Discharge: 2022-10-15 | Disposition: A | Discharge status: Home / Self Care | Payer: Medicare PPO | Source: Ambulatory Visit | Attending: Cardiology | Admitting: Cardiology

## 2022-10-15 DIAGNOSIS — Z955 Presence of coronary angioplasty implant and graft: Secondary | ICD-10-CM | POA: Diagnosis not present

## 2022-10-15 DIAGNOSIS — Z9181 History of falling: Secondary | ICD-10-CM | POA: Diagnosis not present

## 2022-10-15 DIAGNOSIS — E785 Hyperlipidemia, unspecified: Secondary | ICD-10-CM | POA: Diagnosis not present

## 2022-10-15 DIAGNOSIS — I10 Essential (primary) hypertension: Secondary | ICD-10-CM | POA: Diagnosis not present

## 2022-10-15 DIAGNOSIS — I252 Old myocardial infarction: Secondary | ICD-10-CM | POA: Insufficient documentation

## 2022-10-15 DIAGNOSIS — Z733 Stress, not elsewhere classified: Secondary | ICD-10-CM | POA: Insufficient documentation

## 2022-10-15 DIAGNOSIS — M6281 Muscle weakness (generalized): Secondary | ICD-10-CM | POA: Diagnosis not present

## 2022-10-15 DIAGNOSIS — Z48812 Encounter for surgical aftercare following surgery on the circulatory system: Secondary | ICD-10-CM | POA: Insufficient documentation

## 2022-10-15 DIAGNOSIS — R001 Bradycardia, unspecified: Secondary | ICD-10-CM | POA: Diagnosis not present

## 2022-10-15 DIAGNOSIS — I214 Non-ST elevation (NSTEMI) myocardial infarction: Secondary | ICD-10-CM

## 2022-10-15 DIAGNOSIS — S46019A Strain of muscle(s) and tendon(s) of the rotator cuff of unspecified shoulder, initial encounter: Secondary | ICD-10-CM | POA: Diagnosis not present

## 2022-10-16 ENCOUNTER — Other Ambulatory Visit: Payer: Self-pay | Admitting: Cardiovascular Disease

## 2022-10-17 ENCOUNTER — Encounter (HOSPITAL_COMMUNITY)
Admission: RE | Admit: 2022-10-17 | Discharge: 2022-10-17 | Disposition: A | Discharge status: Home / Self Care | Payer: Medicare PPO | Source: Ambulatory Visit | Attending: Cardiovascular Disease

## 2022-10-17 DIAGNOSIS — I214 Non-ST elevation (NSTEMI) myocardial infarction: Secondary | ICD-10-CM

## 2022-10-17 DIAGNOSIS — I252 Old myocardial infarction: Secondary | ICD-10-CM | POA: Diagnosis not present

## 2022-10-17 DIAGNOSIS — Z955 Presence of coronary angioplasty implant and graft: Secondary | ICD-10-CM

## 2022-10-17 DIAGNOSIS — Z733 Stress, not elsewhere classified: Secondary | ICD-10-CM | POA: Diagnosis not present

## 2022-10-17 DIAGNOSIS — R001 Bradycardia, unspecified: Secondary | ICD-10-CM | POA: Diagnosis not present

## 2022-10-17 DIAGNOSIS — Z48812 Encounter for surgical aftercare following surgery on the circulatory system: Secondary | ICD-10-CM | POA: Diagnosis not present

## 2022-10-17 DIAGNOSIS — E785 Hyperlipidemia, unspecified: Secondary | ICD-10-CM | POA: Diagnosis not present

## 2022-10-17 DIAGNOSIS — I10 Essential (primary) hypertension: Secondary | ICD-10-CM | POA: Diagnosis not present

## 2022-10-20 ENCOUNTER — Encounter (HOSPITAL_COMMUNITY)
Admission: RE | Admit: 2022-10-20 | Discharge: 2022-10-20 | Disposition: A | Discharge status: Home / Self Care | Payer: Medicare PPO | Source: Ambulatory Visit | Attending: Cardiovascular Disease | Admitting: Cardiovascular Disease

## 2022-10-20 DIAGNOSIS — I214 Non-ST elevation (NSTEMI) myocardial infarction: Secondary | ICD-10-CM

## 2022-10-20 DIAGNOSIS — Z955 Presence of coronary angioplasty implant and graft: Secondary | ICD-10-CM | POA: Diagnosis not present

## 2022-10-20 DIAGNOSIS — Z9181 History of falling: Secondary | ICD-10-CM | POA: Diagnosis not present

## 2022-10-20 DIAGNOSIS — Z48812 Encounter for surgical aftercare following surgery on the circulatory system: Secondary | ICD-10-CM | POA: Diagnosis not present

## 2022-10-20 DIAGNOSIS — R001 Bradycardia, unspecified: Secondary | ICD-10-CM | POA: Diagnosis not present

## 2022-10-20 DIAGNOSIS — S46019A Strain of muscle(s) and tendon(s) of the rotator cuff of unspecified shoulder, initial encounter: Secondary | ICD-10-CM | POA: Diagnosis not present

## 2022-10-20 DIAGNOSIS — I252 Old myocardial infarction: Secondary | ICD-10-CM | POA: Diagnosis not present

## 2022-10-20 DIAGNOSIS — Z733 Stress, not elsewhere classified: Secondary | ICD-10-CM | POA: Diagnosis not present

## 2022-10-20 DIAGNOSIS — I10 Essential (primary) hypertension: Secondary | ICD-10-CM | POA: Diagnosis not present

## 2022-10-20 DIAGNOSIS — M6281 Muscle weakness (generalized): Secondary | ICD-10-CM | POA: Diagnosis not present

## 2022-10-20 DIAGNOSIS — E785 Hyperlipidemia, unspecified: Secondary | ICD-10-CM | POA: Diagnosis not present

## 2022-10-21 DIAGNOSIS — M6281 Muscle weakness (generalized): Secondary | ICD-10-CM | POA: Diagnosis not present

## 2022-10-21 DIAGNOSIS — S46019A Strain of muscle(s) and tendon(s) of the rotator cuff of unspecified shoulder, initial encounter: Secondary | ICD-10-CM | POA: Diagnosis not present

## 2022-10-21 DIAGNOSIS — Z9181 History of falling: Secondary | ICD-10-CM | POA: Diagnosis not present

## 2022-10-22 ENCOUNTER — Ambulatory Visit: Payer: Medicare PPO | Admitting: Podiatry

## 2022-10-22 ENCOUNTER — Encounter (HOSPITAL_COMMUNITY)
Admission: RE | Admit: 2022-10-22 | Discharge: 2022-10-22 | Disposition: A | Discharge status: Home / Self Care | Payer: Medicare PPO | Source: Ambulatory Visit | Attending: Cardiovascular Disease | Admitting: Cardiovascular Disease

## 2022-10-22 ENCOUNTER — Encounter: Payer: Self-pay | Admitting: Podiatry

## 2022-10-22 DIAGNOSIS — E785 Hyperlipidemia, unspecified: Secondary | ICD-10-CM | POA: Diagnosis not present

## 2022-10-22 DIAGNOSIS — M79675 Pain in left toe(s): Secondary | ICD-10-CM

## 2022-10-22 DIAGNOSIS — Z955 Presence of coronary angioplasty implant and graft: Secondary | ICD-10-CM

## 2022-10-22 DIAGNOSIS — R001 Bradycardia, unspecified: Secondary | ICD-10-CM | POA: Diagnosis not present

## 2022-10-22 DIAGNOSIS — B351 Tinea unguium: Secondary | ICD-10-CM

## 2022-10-22 DIAGNOSIS — I10 Essential (primary) hypertension: Secondary | ICD-10-CM | POA: Diagnosis not present

## 2022-10-22 DIAGNOSIS — Z733 Stress, not elsewhere classified: Secondary | ICD-10-CM | POA: Diagnosis not present

## 2022-10-22 DIAGNOSIS — Z48812 Encounter for surgical aftercare following surgery on the circulatory system: Secondary | ICD-10-CM | POA: Diagnosis not present

## 2022-10-22 DIAGNOSIS — I214 Non-ST elevation (NSTEMI) myocardial infarction: Secondary | ICD-10-CM

## 2022-10-22 DIAGNOSIS — M79674 Pain in right toe(s): Secondary | ICD-10-CM | POA: Diagnosis not present

## 2022-10-22 DIAGNOSIS — N1831 Chronic kidney disease, stage 3a: Secondary | ICD-10-CM

## 2022-10-22 DIAGNOSIS — I252 Old myocardial infarction: Secondary | ICD-10-CM | POA: Diagnosis not present

## 2022-10-22 NOTE — Progress Notes (Signed)
This patient returns to my office for at risk foot care.  This patient requires this care by a professional since this patient will be at risk due to having CKD.  This patient is unable to cut nails himself since the patient cannot reach his nails.These nails are painful walking and wearing shoes.  This patient presents for at risk foot care today.  General Appearance  Alert, conversant and in no acute stress.  Vascular  Dorsalis pedis and posterior tibial  pulses are palpable  bilaterally.  Capillary return is within normal limits  bilaterally. Temperature is within normal limits  bilaterally.  Neurologic  Senn-Weinstein monofilament wire test within normal limits  bilaterally. Muscle power within normal limits bilaterally.  Nails Thick disfigured discolored nails with subungual debris  from hallux to fifth toes bilaterally. No evidence of bacterial infection or drainage bilaterally.  Orthopedic  No limitations of motion  feet .  No crepitus or effusions noted.  No bony pathology or digital deformities noted.  Skin  normotropic skin with no porokeratosis noted bilaterally.  No signs of infections or ulcers noted.     Onychomycosis  Pain in right toes  Pain in left toes  Consent was obtained for treatment procedures.   Mechanical debridement of nails 1-5  bilaterally performed with a nail nipper.  Filed with dremel without incident.    Return office visit   3 months                  Told patient to return for periodic foot care and evaluation due to potential at risk complications.   Chandy Tarman DPM   

## 2022-10-23 DIAGNOSIS — S46019A Strain of muscle(s) and tendon(s) of the rotator cuff of unspecified shoulder, initial encounter: Secondary | ICD-10-CM | POA: Diagnosis not present

## 2022-10-23 DIAGNOSIS — Z9181 History of falling: Secondary | ICD-10-CM | POA: Diagnosis not present

## 2022-10-23 DIAGNOSIS — M6281 Muscle weakness (generalized): Secondary | ICD-10-CM | POA: Diagnosis not present

## 2022-10-24 ENCOUNTER — Encounter (HOSPITAL_COMMUNITY)
Admission: RE | Admit: 2022-10-24 | Discharge: 2022-10-24 | Disposition: A | Discharge status: Home / Self Care | Payer: Medicare PPO | Source: Ambulatory Visit | Attending: Cardiovascular Disease | Admitting: Cardiovascular Disease

## 2022-10-24 DIAGNOSIS — I252 Old myocardial infarction: Secondary | ICD-10-CM | POA: Diagnosis not present

## 2022-10-24 DIAGNOSIS — R001 Bradycardia, unspecified: Secondary | ICD-10-CM | POA: Diagnosis not present

## 2022-10-24 DIAGNOSIS — I214 Non-ST elevation (NSTEMI) myocardial infarction: Secondary | ICD-10-CM

## 2022-10-24 DIAGNOSIS — I10 Essential (primary) hypertension: Secondary | ICD-10-CM | POA: Diagnosis not present

## 2022-10-24 DIAGNOSIS — Z955 Presence of coronary angioplasty implant and graft: Secondary | ICD-10-CM

## 2022-10-24 DIAGNOSIS — Z733 Stress, not elsewhere classified: Secondary | ICD-10-CM | POA: Diagnosis not present

## 2022-10-24 DIAGNOSIS — Z48812 Encounter for surgical aftercare following surgery on the circulatory system: Secondary | ICD-10-CM | POA: Diagnosis not present

## 2022-10-24 DIAGNOSIS — E785 Hyperlipidemia, unspecified: Secondary | ICD-10-CM | POA: Diagnosis not present

## 2022-10-27 ENCOUNTER — Ambulatory Visit (INDEPENDENT_AMBULATORY_CARE_PROVIDER_SITE_OTHER): Payer: Medicare PPO

## 2022-10-27 ENCOUNTER — Encounter (HOSPITAL_COMMUNITY)
Admission: RE | Admit: 2022-10-27 | Discharge: 2022-10-27 | Disposition: A | Discharge status: Home / Self Care | Payer: Medicare PPO | Source: Ambulatory Visit | Attending: Cardiovascular Disease

## 2022-10-27 DIAGNOSIS — Z48812 Encounter for surgical aftercare following surgery on the circulatory system: Secondary | ICD-10-CM | POA: Diagnosis not present

## 2022-10-27 DIAGNOSIS — R001 Bradycardia, unspecified: Secondary | ICD-10-CM | POA: Diagnosis not present

## 2022-10-27 DIAGNOSIS — Z733 Stress, not elsewhere classified: Secondary | ICD-10-CM | POA: Diagnosis not present

## 2022-10-27 DIAGNOSIS — E785 Hyperlipidemia, unspecified: Secondary | ICD-10-CM | POA: Diagnosis not present

## 2022-10-27 DIAGNOSIS — I10 Essential (primary) hypertension: Secondary | ICD-10-CM | POA: Diagnosis not present

## 2022-10-27 DIAGNOSIS — I4892 Unspecified atrial flutter: Secondary | ICD-10-CM

## 2022-10-27 DIAGNOSIS — I214 Non-ST elevation (NSTEMI) myocardial infarction: Secondary | ICD-10-CM

## 2022-10-27 DIAGNOSIS — I252 Old myocardial infarction: Secondary | ICD-10-CM | POA: Diagnosis not present

## 2022-10-27 DIAGNOSIS — Z955 Presence of coronary angioplasty implant and graft: Secondary | ICD-10-CM | POA: Diagnosis not present

## 2022-10-27 LAB — CUP PACEART REMOTE DEVICE CHECK
Date Time Interrogation Session: 20241013231025
Implantable Pulse Generator Implant Date: 20211221

## 2022-10-28 DIAGNOSIS — Z9181 History of falling: Secondary | ICD-10-CM | POA: Diagnosis not present

## 2022-10-28 DIAGNOSIS — M6281 Muscle weakness (generalized): Secondary | ICD-10-CM | POA: Diagnosis not present

## 2022-10-28 DIAGNOSIS — S46019A Strain of muscle(s) and tendon(s) of the rotator cuff of unspecified shoulder, initial encounter: Secondary | ICD-10-CM | POA: Diagnosis not present

## 2022-10-29 ENCOUNTER — Encounter (HOSPITAL_COMMUNITY)
Admission: RE | Admit: 2022-10-29 | Discharge: 2022-10-29 | Disposition: A | Discharge status: Home / Self Care | Payer: Medicare PPO | Source: Ambulatory Visit | Attending: Cardiovascular Disease

## 2022-10-29 DIAGNOSIS — I252 Old myocardial infarction: Secondary | ICD-10-CM | POA: Diagnosis not present

## 2022-10-29 DIAGNOSIS — I214 Non-ST elevation (NSTEMI) myocardial infarction: Secondary | ICD-10-CM

## 2022-10-29 DIAGNOSIS — Z48812 Encounter for surgical aftercare following surgery on the circulatory system: Secondary | ICD-10-CM | POA: Diagnosis not present

## 2022-10-29 DIAGNOSIS — I10 Essential (primary) hypertension: Secondary | ICD-10-CM | POA: Diagnosis not present

## 2022-10-29 DIAGNOSIS — Z955 Presence of coronary angioplasty implant and graft: Secondary | ICD-10-CM

## 2022-10-29 DIAGNOSIS — Z733 Stress, not elsewhere classified: Secondary | ICD-10-CM | POA: Diagnosis not present

## 2022-10-29 DIAGNOSIS — R001 Bradycardia, unspecified: Secondary | ICD-10-CM | POA: Diagnosis not present

## 2022-10-29 DIAGNOSIS — E785 Hyperlipidemia, unspecified: Secondary | ICD-10-CM | POA: Diagnosis not present

## 2022-10-30 DIAGNOSIS — Z9181 History of falling: Secondary | ICD-10-CM | POA: Diagnosis not present

## 2022-10-30 DIAGNOSIS — M6281 Muscle weakness (generalized): Secondary | ICD-10-CM | POA: Diagnosis not present

## 2022-10-30 DIAGNOSIS — S46019A Strain of muscle(s) and tendon(s) of the rotator cuff of unspecified shoulder, initial encounter: Secondary | ICD-10-CM | POA: Diagnosis not present

## 2022-10-30 NOTE — Progress Notes (Signed)
Cardiac Individual Treatment Plan  Patient Details  Name: Francisco Larson MRN: [id number] Date of Birth: 1935-07-24 Referring Provider:   Flowsheet Row INTENSIVE CARDIAC REHAB ORIENT from 07/22/2022 in [facility]  Referring Provider Charlton Haws, MD       Initial Encounter Date:  Flowsheet Row INTENSIVE CARDIAC REHAB ORIENT from 07/22/2022 in [facility]  Date 07/22/22       Visit Diagnosis: 06/26/22 NSTEMI (non-ST elevated myocardial infarction) (HCC)  06/27/22 Status post coronary artery stent placement  Patient's Home Medications on Admission:  Current Outpatient Medications:    amLODipine (NORVASC) 5 MG tablet, TAKE 1 TABLET (5 MG TOTAL) BY MOUTH DAILY., Disp: 90 tablet, Rfl: 2   aspirin EC (ASPIRIN LOW DOSE) 81 MG tablet, TAKE 1 TABLET (81 MG TOTAL) BY MOUTH DAILY. SWALLOW WHOLE., Disp: 30 tablet, Rfl: 10   atorvastatin (LIPITOR) 80 MG tablet, TAKE 1 TABLET BY MOUTH EVERY DAY, Disp: 90 tablet, Rfl: 3   carvedilol (COREG) 3.125 MG tablet, Take 1 tablet (3.125 mg total) by mouth 2 (two) times daily with a meal., Disp: 60 tablet, Rfl: 2   clopidogrel (PLAVIX) 75 MG tablet, TAKE 1 TABLET BY MOUTH EVERY DAY, Disp: 90 tablet, Rfl: 3   Multiple Vitamins-Minerals (CENTRUM SILVER PO), Take 1 tablet by mouth daily with breakfast., Disp: , Rfl:    nitroGLYCERIN (NITROSTAT) 0.4 MG SL tablet, Place 1 tablet (0.4 mg total) under the tongue every 5 (five) minutes as needed for chest pain., Disp: 25 tablet, Rfl: 3   TYLENOL 325 MG tablet, Take 325-650 mg by mouth every 6 (six) hours as needed for mild pain or headache., Disp: , Rfl:   Past Medical History: Past Medical History:  Diagnosis Date   Arthritis    Coronary artery disease    Hyperlipidemia    Leukemia, chronic lymphocytic (HCC)    SCCA (squamous cell carcinoma) of skin 08/08/2020   Mid Frontal Scalp (in situ)  (curet and 5FU)   SCCA (squamous cell carcinoma) of skin 08/08/2020   Mid Parietal Scalp (in situ) (curet and 5FU)   SCCA (squamous cell carcinoma) of skin 08/08/2020   Mid Supratip of Nose (in situ)   Shingles    Squamous cell carcinoma of skin 12/08/2018   right jaw line cis   Vitamin D deficiency     Tobacco Use: Social History   Tobacco Use  Smoking Status Never  Smokeless Tobacco Never    Labs: Review Flowsheet  More data exists      Latest Ref Rng & Units 12/02/2021 04/09/2022 06/26/2022 06/27/2022 09/23/2022  Labs for ITP Cardiac and Pulmonary Rehab  Cholestrol 100 - 199 mg/dL 440  - - 102  725   LDL (calc) 0 - 99 mg/dL 88  - - 76  61   HDL-C >39 mg/dL 32  - - 32  27   Trlycerides 0 - 149 mg/dL 366  - - 71  86   Hemoglobin A1c 4.8 - 5.6 % - 6.1  5.7  - -    Details            Capillary Blood Glucose: No results found for: "GLUCAP"   Exercise Target Goals: Exercise Program Goal: Individual exercise prescription set using results from initial 6 min walk test and THRR while considering  patient's activity barriers and safety.   Exercise Prescription Goal: Initial exercise prescription builds to 30-45 minutes  a day of aerobic activity, 2-3 days per week.  Home exercise guidelines will be given to patient during program as part of exercise prescription that the participant will acknowledge.  Activity Barriers & Risk Stratification:  Activity Barriers & Cardiac Risk Stratification - 07/22/22 1027       Activity Barriers & Cardiac Risk Stratification   Activity Barriers Arthritis;Joint Problems;History of Falls;Deconditioning;Balance Concerns    Cardiac Risk Stratification High             6 Minute Walk:  6 Minute Walk     Row Name 07/22/22 1155         6 Minute Walk   Phase Initial     Distance 1353 feet     Walk Time 6 minutes     # of Rest Breaks 0     MPH 2.6     METS 1.9     RPE 9     Perceived Dyspnea  0     VO2 Peak 6.6     Symptoms  Yes (comment)     Comments Mild lightheadedness on last lap     Resting HR 52 bpm     Resting BP 108/60     Resting Oxygen Saturation  98 %     Exercise Oxygen Saturation  during 6 min walk 98 %     Max Ex. HR 64 bpm     Max Ex. BP 130/64     2 Minute Post BP 112/70              Oxygen Initial Assessment:   Oxygen Re-Evaluation:   Oxygen Discharge (Final Oxygen Re-Evaluation):   Initial Exercise Prescription:  Initial Exercise Prescription - 07/22/22 1100       Date of Initial Exercise RX and Referring Provider   Date 07/22/22    Referring Provider Charlton Haws, MD    Expected Discharge Date 10/15/22      Recumbant Bike   Level 1    RPM 60    Watts 20    Minutes 15    METs 1.9      NuStep   Level 1    SPM 75    Minutes 15    METs 1.9      Prescription Details   Frequency (times per week) 3    Duration Progress to 30 minutes of continuous aerobic without signs/symptoms of physical distress      Intensity   THRR 40-80% of Max Heartrate 54-109    Ratings of Perceived Exertion 11-13    Perceived Dyspnea 0-4      Progression   Progression Continue progressive overload as per policy without signs/symptoms or physical distress.      Resistance Training   Training Prescription Yes    Weight 3 lbs    Reps 10-15             Perform Capillary Blood Glucose checks as needed.  Exercise Prescription Changes:   Exercise Prescription Changes     Row Name 08/18/22 1600 08/29/22 1600 09/01/22 1600 09/17/22 1200 09/29/22 1030     Response to Exercise   Blood Pressure (Admit) 120/80 112/58 130/72 128/58 118/70   Blood Pressure (Exercise) 134/70 132/70 130/62 152/70 154/74   Blood Pressure (Exit) 106/70 114/70 100/62 112/56 118/68   Heart Rate (Admit) 60 bpm 57 bpm -- 61 bpm 57 bpm   Heart Rate (Exercise) 73 bpm 100 bpm 83 bpm 113 bpm 116 bpm   Heart Rate (Exit)  61 bpm 67 bpm 56 bpm 71 bpm 78 bpm   Rating of Perceived Exertion (Exercise) 11 13 11 11 13     Symptoms None None None None None   Comments Pt's firsst day in the CRP2 program Reviewed METs with patient Reviewed home exercise Rx Reviewed METs and goals Reviewed METs   Duration Continue with 30 min of aerobic exercise without signs/symptoms of physical distress. Continue with 30 min of aerobic exercise without signs/symptoms of physical distress. Continue with 30 min of aerobic exercise without signs/symptoms of physical distress. Continue with 30 min of aerobic exercise without signs/symptoms of physical distress. Continue with 30 min of aerobic exercise without signs/symptoms of physical distress.   Intensity THRR unchanged THRR unchanged THRR unchanged THRR unchanged THRR unchanged     Progression   Progression Continue to progress workloads to maintain intensity without signs/symptoms of physical distress. Continue to progress workloads to maintain intensity without signs/symptoms of physical distress. Continue to progress workloads to maintain intensity without signs/symptoms of physical distress. Continue to progress workloads to maintain intensity without signs/symptoms of physical distress. Continue to progress workloads to maintain intensity without signs/symptoms of physical distress.   Average METs 2.3 2.65 2.55 3.25 4.4     Resistance Training   Training Prescription Yes Yes Yes No Yes   Weight 3 lbs 3 lbs 3 lbs No weights on wednesdays 4 lb wts   Reps 10-15 10-15 10-15 -- 10-15   Time 10 Minutes 10 Minutes 10 Minutes -- 10 Minutes     Interval Training   Interval Training No No No No No     Recumbant Bike   Level 1 2 2 3 5    RPM 67 78 71 79 57   Watts 18 29 28  53 51   Minutes 15 15 15 15 15    METs 2.1 2.6 2.4 3.6 4.6     NuStep   Level 1 2 2 3 5    SPM 91 96 99 100 107   Minutes 15 15 15 15 15    METs 2.5 2.7 2.7 2.9 4.2     Home Exercise Plan   Plans to continue exercise at -- -- Home (comment) Home (comment) Home (comment)   Frequency -- -- Add 3 additional days  to program exercise sessions. Add 3 additional days to program exercise sessions. Add 3 additional days to program exercise sessions.   Initial Home Exercises Provided -- -- 09/01/22 09/01/22 09/01/22    Row Name 10/24/22 1015             Response to Exercise   Blood Pressure (Admit) 130/60       Blood Pressure (Exit) 116/72       Heart Rate (Admit) 53 bpm       Heart Rate (Exercise) 114 bpm       Heart Rate (Exit) 62 bpm       Rating of Perceived Exertion (Exercise) 12       Symptoms None       Comments Reviewed METs and goals       Duration Continue with 30 min of aerobic exercise without signs/symptoms of physical distress.       Intensity THRR unchanged         Progression   Progression Continue to progress workloads to maintain intensity without signs/symptoms of physical distress.       Average METs 4.55         Resistance Training   Training Prescription Yes  Weight 5 lbs       Reps 10-15       Time 10 Minutes         Interval Training   Interval Training No         Recumbant Bike   Level 5       Watts 93       Minutes 15       METs 5.2         NuStep   Level 5       SPM 97       Minutes 15       METs 3.9         Home Exercise Plan   Plans to continue exercise at Home (comment)       Frequency Add 3 additional days to program exercise sessions.       Initial Home Exercises Provided 09/01/22                Exercise Comments:   Exercise Comments     Row Name 08/18/22 1607 08/29/22 1648 09/01/22 1658 09/17/22 1212 09/29/22 1030   Exercise Comments Pt's first day in the CRP2 program. Pt exercised without complaints and is off to a good start. Reviewed METs today. Progressing well. Pt agreeable to increase on both modalities today. Reviewed home exercise Rx with patient today. Pt has been waiting to resume walking. We have been giving the patient a few weeks to exercise here in the CRP2  before resuming walking at home. Pt denies anymore episodes  of lightheadedness and has been asymptomatic here CR. Pt will begin by walking 10 minutes 2x/day and slowly add time until is back to 20 minutes 2x/day. Pt verbalized understanding of the home exercise Rx and was provided a copy. Reviewed METs and Goals. Will increase workload on nustep next session. Pt is walking 10 minutes 2x/day and will increase to 3x/day. Pt plans to start going to the driving range to hit golf balls. Reviewed METs. Pt is making good progress. Pt is feeling better and has been able to play 9 holes of golf with hsi friends; this is a patient goal.    Row Name 10/24/22 1015           Exercise Comments Reviewed METs and goals. Pt is making good progress. Pt is feeling better and playing golf with his friends: this is a patient goal.                Exercise Goals and Review:   Exercise Goals     Row Name 07/22/22 0921             Exercise Goals   Increase Physical Activity Yes       Intervention Provide advice, education, support and counseling about physical activity/exercise needs.;Develop an individualized exercise prescription for aerobic and resistive training based on initial evaluation findings, risk stratification, comorbidities and participant's personal goals.       Expected Outcomes Short Term: Attend rehab on a regular basis to increase amount of physical activity.;Long Term: Exercising regularly at least 3-5 days a week.;Long Term: Add in home exercise to make exercise part of routine and to increase amount of physical activity.       Increase Strength and Stamina Yes       Intervention Provide advice, education, support and counseling about physical activity/exercise needs.;Develop an individualized exercise prescription for aerobic and resistive training based on initial evaluation findings, risk stratification, comorbidities and participant's  personal goals.       Expected Outcomes Short Term: Increase workloads from initial exercise prescription for  resistance, speed, and METs.;Short Term: Perform resistance training exercises routinely during rehab and add in resistance training at home;Long Term: Improve cardiorespiratory fitness, muscular endurance and strength as measured by increased METs and functional capacity ( )       Able to understand and use rate of perceived exertion (RPE) scale Yes       Intervention Provide education and explanation on how to use RPE scale       Expected Outcomes Short Term: Able to use RPE daily in rehab to express subjective intensity level;Long Term:  Able to use RPE to guide intensity level when exercising independently       Knowledge and understanding of Target Heart Rate Range (THRR) Yes       Intervention Provide education and explanation of THRR including how the numbers were predicted and where they are located for reference       Expected Outcomes Short Term: Able to state/look up THRR;Long Term: Able to use THRR to govern intensity when exercising independently;Short Term: Able to use daily as guideline for intensity in rehab       Understanding of Exercise Prescription Yes       Intervention Provide education, explanation, and written materials on patient's individual exercise prescription       Expected Outcomes Short Term: Able to explain program exercise prescription;Long Term: Able to explain home exercise prescription to exercise independently                Exercise Goals Re-Evaluation :  Exercise Goals Re-Evaluation     Row Name 08/18/22 1603 09/17/22 1213 10/24/22 1015         Exercise Goal Re-Evaluation   Exercise Goals Review Increase Physical Activity;Understanding of Exercise Prescription;Increase Strength and Stamina;Knowledge and understanding of Target Heart Rate Range (THRR);Able to understand and use rate of perceived exertion (RPE) scale Increase Physical Activity;Understanding of Exercise Prescription;Increase Strength and Stamina;Knowledge and understanding of Target  Heart Rate Range (THRR);Able to understand and use rate of perceived exertion (RPE) scale Increase Physical Activity;Understanding of Exercise Prescription;Increase Strength and Stamina;Knowledge and understanding of Target Heart Rate Range (THRR);Able to understand and use rate of perceived exertion (RPE) scale     Comments Pt's first day in the CRP2 program. Pt understands the exercise Rx, RPE scale and THRR. Reviewed METs and goals. Pt making good progress, peak METs 3.6. Progress on goals of less fatigue and getting back to normal activites. Reviewed METs and goals. Pt making good progress, peak METs 5.2. 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  Pt is back to normal activites again.     Expected Outcomes Will continue to monitor patient and progress exercise workloads as tolerated. Will continue to monitor patient and progress exercise workloads as tolerated. Will continue to monitor patient and progress exercise workloads as tolerated.              Discharge Exercise Prescription (Final Exercise Prescription Changes):  Exercise Prescription Changes - 10/24/22 1015       Response to Exercise   Blood Pressure (Admit) 130/60    Blood Pressure (Exit) 116/72    Heart Rate (Admit) 53 bpm    Heart Rate (Exercise) 114 bpm    Heart Rate (Exit) 62 bpm    Rating of Perceived Exertion (Exercise) 12    Symptoms None  Comments Reviewed METs and goals    Duration Continue with 30 min of aerobic exercise without signs/symptoms of physical distress.    Intensity THRR unchanged      Progression   Progression Continue to progress workloads to maintain intensity without signs/symptoms of physical distress.    Average METs 4.55      Resistance Training   Training Prescription Yes    Weight 5 lbs    Reps 10-15    Time 10 Minutes      Interval Training   Interval Training No      Recumbant Bike   Level 5    Watts 93    Minutes 15     METs 5.2      NuStep   Level 5    SPM 97    Minutes 15    METs 3.9      Home Exercise Plan   Plans to continue exercise at Home (comment)    Frequency Add 3 additional days to program exercise sessions.    Initial Home Exercises Provided 09/01/22             Nutrition:  Target Goals: Understanding of nutrition guidelines, daily intake of sodium 1500mg , cholesterol 200mg , calories 30% from fat and 7% or less from saturated fats, daily to have 5 or more servings of fruits and vegetables.  Biometrics:  Pre Biometrics - 07/22/22 1025       Pre Biometrics   Waist Circumference 42 inches    Hip Circumference 42.5 inches    Waist to Hip Ratio 0.99 %    Triceps Skinfold 13 mm    % Body Fat 28.4 %    Grip Strength 31 kg    Flexibility 0 in   Could not reach   Single Leg Stand 3 seconds              Nutrition Therapy Plan and Nutrition Goals:  Nutrition Therapy & Goals - 10/15/22 1047       Nutrition Therapy   Diet Heart Healthy Diet    Drug/Food Interactions Statins/Certain Fruits      Personal Nutrition Goals   Nutrition Goal Patient to identify strategies for reducing cardiovascular risk by attending the [employer] education and nutrition series weekly.   goal in action.   Personal Goal #2 Patient to improve diet quality by using the plate method as a guide for meal planning to include lean protein/plant protein, fruits, vegetables, whole grains, nonfat dairy as part of a well-balanced diet.   goal in action.   Personal Goal #3 Patient to reduce sodium intake to 1500mg  per day   goal in progress.   Comments Goals in action. Francisco Larson continues to attend the [employer] education and nutrition series regularly. He does live at [facility] retirement community and many of his meals are provided there; he continues to make high fiber, lean protein food choices. LDL remains above goal of <40; he continues crestor 80mg . He has maintained his weight since starting with  our program. Patient will benefit from participation in intensive cardiac rehab for nutrition, exercise, and lifestyle modification.      Intervention Plan   Intervention Prescribe, educate and counsel regarding individualized specific dietary modifications aiming towards targeted core components such as weight, hypertension, lipid management, diabetes, heart failure and other comorbidities.;Nutrition handout(s) given to patient.    Expected Outcomes Short Term Goal: Understand basic principles of dietary content, such as calories, fat, sodium, cholesterol and nutrients.;Long Term Goal: Adherence  to prescribed nutrition plan.             Nutrition Assessments:  Nutrition Assessments - 09/02/22 0915       Rate Your Plate Scores   Pre Score 46            MEDIFICTS Score Key: >=70 Need to make dietary changes  40-70 Heart Healthy Diet <= 40 Therapeutic Level Cholesterol Diet   Flowsheet Row INTENSIVE CARDIAC REHAB from 09/01/2022 in [facility]  Picture Your Plate Total Score on Admission 46      Picture Your Plate Scores: <81 Unhealthy dietary pattern with much room for improvement. 41-50 Dietary pattern unlikely to meet recommendations for good health and room for improvement. 51-60 More healthful dietary pattern, with some room for improvement.  >60 Healthy dietary pattern, although there may be some specific behaviors that could be improved.    Nutrition Goals Re-Evaluation:  Nutrition Goals Re-Evaluation     Row Name 08/18/22 1000 09/18/22 0856 10/15/22 1047         Goals   Current Weight 209 lb 7 oz (95 kg) 207 lb 7.3 oz (94.1 kg) 207 lb 7.3 oz (94.1 kg)     Comment lipoproteinA WNL, HDL 32, LDL 76, A1c 5.7 no new labs; most recent labs  lipoproteinA WNL, HDL 32, LDL 76, A1c 5.7 HDL 27, LDL 61; other most recent labs A1c 5.7, Lipoprotein A WNL     Expected Outcome Patient will benefit from participation in  intensive cardiac rehab for nutrition, exercise, and lifestyle modification. Goals in action. Francisco Larson continues to attend the [employer] education and nutrition series regularly. He does live at [facility] retirement community and many of his meals are provided there. LDL remains above goal; he continues crestor 80mg . He has maintained his weight since starting with our program. Patient will benefit from participation in intensive cardiac rehab for nutrition, exercise, and lifestyle modification. Goals in action. Francisco Larson continues to attend the [employer] education and nutrition series regularly. He does live at [facility] retirement community and many of his meals are provided there; he continues to make high fiber, lean protein food choices. LDL remains above goal of <19; he continues crestor 80mg . He has maintained his weight since starting with our program. Patient will benefit from participation in intensive cardiac rehab for nutrition, exercise, and lifestyle modification.              Nutrition Goals Re-Evaluation:  Nutrition Goals Re-Evaluation     Row Name 08/18/22 1000 09/18/22 0856 10/15/22 1047         Goals   Current Weight 209 lb 7 oz (95 kg) 207 lb 7.3 oz (94.1 kg) 207 lb 7.3 oz (94.1 kg)     Comment lipoproteinA WNL, HDL 32, LDL 76, A1c 5.7 no new labs; most recent labs  lipoproteinA WNL, HDL 32, LDL 76, A1c 5.7 HDL 27, LDL 61; other most recent labs A1c 5.7, Lipoprotein A WNL     Expected Outcome Patient will benefit from participation in intensive cardiac rehab for nutrition, exercise, and lifestyle modification. Goals in action. Francisco Larson continues to attend the [employer] education and nutrition series regularly. He does live at [facility] retirement community and many of his meals are provided there. LDL remains above goal; he continues crestor 80mg . He has maintained his weight since starting with our program. Patient will benefit from participation in intensive  cardiac rehab for nutrition,  exercise, and lifestyle modification. Goals in action. Francisco Larson continues to attend the [employer] education and nutrition series regularly. He does live at [facility] retirement community and many of his meals are provided there; he continues to make high fiber, lean protein food choices. LDL remains above goal of <08; he continues crestor 80mg . He has maintained his weight since starting with our program. Patient will benefit from participation in intensive cardiac rehab for nutrition, exercise, and lifestyle modification.              Nutrition Goals Discharge (Final Nutrition Goals Re-Evaluation):  Nutrition Goals Re-Evaluation - 10/15/22 1047       Goals   Current Weight 207 lb 7.3 oz (94.1 kg)    Comment HDL 27, LDL 61; other most recent labs A1c 5.7, Lipoprotein A WNL    Expected Outcome Goals in action. Francisco Larson continues to attend the [employer] education and nutrition series regularly. He does live at [facility] retirement community and many of his meals are provided there; he continues to make high fiber, lean protein food choices. LDL remains above goal of <65; he continues crestor 80mg . He has maintained his weight since starting with our program. Patient will benefit from participation in intensive cardiac rehab for nutrition, exercise, and lifestyle modification.             Psychosocial: Target Goals: Acknowledge presence or absence of significant depression and/or stress, maximize coping skills, provide positive support system. Participant is able to verbalize types and ability to use techniques and skills needed for reducing stress and depression.  Initial Review & Psychosocial Screening:  Initial Psych Review & Screening - 07/22/22 1027       Initial Review   Current issues with None Identified      Family Dynamics   Good Support System? --   Francisco Larson has his wife for support     Barriers   Psychosocial barriers to participate in  program There are no identifiable barriers or psychosocial needs.      Screening Interventions   Interventions Encouraged to exercise             Quality of Life Scores:  Quality of Life - 10/29/22 0830       Quality of Life Scores   Health/Function Pre 26.04 %    Health/Function Post 28.2 %    Health/Function % Change 8.29 %    Socioeconomic Pre 28.44 %    Socioeconomic Post 28.21 %    Socioeconomic % Change  -0.81 %    Psych/Spiritual Pre 28.79 %    Psych/Spiritual Post 29.14 %    Psych/Spiritual % Change 1.22 %    Family Pre 30 %    Family Post 28.8 %    Family % Change -4 %    GLOBAL Pre 27.75 %    GLOBAL Post 28.49 %    GLOBAL % Change 2.67 %            Scores of 19 and below usually indicate a poorer quality of life in these areas.  A difference of  2-3 points is a clinically meaningful difference.  A difference of 2-3 points in the total score of the Quality of Life Index has been associated with significant improvement in overall quality of life, self-image, physical symptoms, and general health in studies assessing change in quality of life.  PHQ-9: Review Flowsheet  More data exists      08/12/2022 07/22/2022 07/22/2021 04/01/2021 03/28/2020  Depression  screen PHQ 2/9  Decreased Interest 0 0 0 0 0  Down, Depressed, Hopeless 0 0 0 0 0  PHQ - 2 Score 0 0 0 0 0  Altered sleeping 0 0 - - -  Tired, decreased energy 1 1 - - -  Change in appetite 0 0 - - -  Feeling bad or failure about yourself  - 0 - - -  Trouble concentrating - 0 - - -  Moving slowly or fidgety/restless 0 0 - - -  Suicidal thoughts 0 0 - - -  PHQ-9 Score 1 1 - - -  Difficult doing work/chores Not difficult at all Not difficult at all - - -    Details           Interpretation of Total Score  Total Score Depression Severity:  1-4 = Minimal depression, 5-9 = Mild depression, 10-14 = Moderate depression, 15-19 = Moderately severe depression, 20-27 = Severe depression   Psychosocial  Evaluation and Intervention:   Psychosocial Re-Evaluation:  Psychosocial Re-Evaluation     Row Name 09/09/22 5284 10/07/22 1143 10/30/22 1459         Psychosocial Re-Evaluation   Current issues with None Identified None Identified None Identified     Comments -- -- Francisco Larson will complete cardiac rehab on 11/05/22.     Interventions Encouraged to attend Cardiac Rehabilitation for the exercise Encouraged to attend Cardiac Rehabilitation for the exercise Encouraged to attend Cardiac Rehabilitation for the exercise     Continue Psychosocial Services  No Follow up required No Follow up required No Follow up required              Psychosocial Discharge (Final Psychosocial Re-Evaluation):  Psychosocial Re-Evaluation - 10/30/22 1459       Psychosocial Re-Evaluation   Current issues with None Identified    Comments Francisco Larson will complete cardiac rehab on 11/05/22.    Interventions Encouraged to attend Cardiac Rehabilitation for the exercise    Continue Psychosocial Services  No Follow up required             Vocational Rehabilitation: Provide vocational rehab assistance to qualifying candidates.   Vocational Rehab Evaluation & Intervention:  Vocational Rehab - 07/22/22 1028       Initial Vocational Rehab Evaluation & Intervention   Assessment shows need for Vocational Rehabilitation No   Pt is retired, no vocational needs            Education: Education Goals: Education classes will be provided on a weekly basis, covering required topics. Participant will state understanding/return demonstration of topics presented.    Education     Row Name 08/18/22 0900     Education   Cardiac Education Topics Pritikin   Glass blower/designer Nutrition   Nutrition Workshop Fueling a Forensic psychologist   Instruction Review Code 1- Verbalizes Understanding   Class Start Time 0815   Class Stop Time 0900   Class Time Calculation (min) 45 min     Row Name 08/20/22 1000     Education   Cardiac Education Topics Pritikin   Secondary school teacher School   Educator Dietitian   Weekly Topic Simple Sides and Sauces   Instruction Review Code 1- Verbalizes Understanding   Class Start Time 0815   Class Stop Time 0846   Class Time Calculation (min) 31 min    Row Name 08/22/22 0900  Education   Cardiac Education Topics Pritikin   Nurse, children's Exercise Physiologist   Select Psychosocial   Psychosocial How Our Thoughts Can Heal Our Hearts   Instruction Review Code 1- Verbalizes Understanding   Class Start Time [phone]   Class Stop Time [phone]   Class Time Calculation (min) 35 min    Row Name 08/25/22 0900     Education   Cardiac Education Topics Pritikin   Select Workshops     Workshops   Educator Exercise Physiologist   Select Exercise   Exercise Workshop Managing Heart Disease: Your Path to a Healthier Heart   Instruction Review Code 1- Verbalizes Understanding   Class Start Time [phone]   Class Stop Time 0908   Class Time Calculation (min) 60 min    Row Name 08/27/22 1000     Education   Cardiac Education Topics Pritikin   Secondary school teacher School   Educator Dietitian   Weekly Topic Powerhouse Plant-Based Proteins   Instruction Review Code 1- Verbalizes Understanding   Class Start Time 0815   Class Stop Time 0856   Class Time Calculation (min) 41 min    Row Name 08/29/22 0900     Education   Cardiac Education Topics Pritikin   Select Core Videos     Core Videos   Educator Exercise Physiologist   Select General Education   General Education Hypertension and Heart Disease   Instruction Review Code 1- Verbalizes Understanding   Class Start Time 0810   Class Stop Time 0850   Class Time Calculation (min) 40 min    Row Name 09/01/22 0900     Education   Cardiac Education Topics Pritikin   Arts administrator Psychosocial   Psychosocial Workshop From Head to Heart: The Power of a Healthy Outlook   Instruction Review Code 1- Verbalizes Understanding   Class Start Time 0815   Class Stop Time 0904   Class Time Calculation (min) 49 min    Row Name 09/03/22 1000     Education   Cardiac Education Topics Pritikin   Secondary school teacher School   Educator Dietitian   Weekly Topic Adding Flavor - Sodium-Free   Instruction Review Code 1- Verbalizes Understanding   Class Start Time [phone]   Class Stop Time 0845   Class Time Calculation (min) 31 min    Row Name 09/08/22 1100     Education   Cardiac Education Topics Pritikin   Select Core Videos     Core Videos   Educator Dietitian   Select Nutrition   Nutrition Overview of the Pritikin Eating Plan   Instruction Review Code 1- Verbalizes Understanding   Class Start Time 0815   Class Stop Time 0850   Class Time Calculation (min) 35 min    Row Name 09/10/22 1100     Education   Cardiac Education Topics Pritikin   Orthoptist   Educator Dietitian   Weekly Topic Fast and Healthy Breakfasts   Instruction Review Code 1- Verbalizes Understanding   Class Start Time 0815   Class Stop Time 0847   Class Time Calculation (min) 32 min    Row Name 09/12/22 0800     Education   Cardiac Education Topics Pritikin   Select Core Videos  Core Videos   Educator Exercise Physiologist   Select Psychosocial   Psychosocial Healthy Minds, Bodies, Hearts   Instruction Review Code 1- Verbalizes Understanding   Class Start Time 0813   Class Stop Time 0845   Class Time Calculation (min) 32 min    Row Name 09/17/22 1000     Education   Cardiac Education Topics Pritikin   Secondary school teacher School   Educator Dietitian   Weekly Topic Personalizing Your Pritikin Plate   Instruction Review Code 1- Verbalizes Understanding   Class Start Time 0815   Class Stop Time  0848   Class Time Calculation (min) 33 min    Row Name 09/19/22 0900     Education   Cardiac Education Topics Pritikin   Select Workshops     Workshops   Educator Exercise Physiologist   Select Exercise   Exercise Workshop Location manager and Fall Prevention   Instruction Review Code 1- Verbalizes Understanding   Class Start Time [phone]   Class Stop Time 0856   Class Time Calculation (min) 45 min    Row Name 09/22/22 0900     Education   Cardiac Education Topics Pritikin   Glass blower/designer Nutrition   Nutrition Workshop Label Reading   Instruction Review Code 1- Verbalizes Understanding   Class Start Time 0815   Class Stop Time 0904   Class Time Calculation (min) 49 min    Row Name 09/24/22 1100     Education   Cardiac Education Topics Pritikin   Customer service manager   Weekly Topic [employer] Desserts   Instruction Review Code 1- Verbalizes Understanding   Class Start Time 0820   Class Stop Time 0900   Class Time Calculation (min) 40 min    Row Name 09/26/22 1000     Education   Cardiac Education Topics Pritikin   Nurse, children's   Educator Dietitian   Select Nutrition   Nutrition Other  Label Reading   Instruction Review Code 1- Verbalizes Understanding   Class Start Time 0815   Class Stop Time 0900   Class Time Calculation (min) 45 min    Row Name 09/29/22 1100     Education   Cardiac Education Topics Pritikin   Select Workshops     Workshops   Educator Exercise Physiologist   Select Psychosocial   Psychosocial Workshop Recognizing and Reducing Stress   Instruction Review Code 1- Verbalizes Understanding   Class Start Time 0815   Class Stop Time 0900   Class Time Calculation (min) 45 min    Row Name 10/01/22 1000     Education   Cardiac Education Topics Pritikin   Secondary school teacher School   Educator Dietitian    Weekly Topic Tasty Appetizers and Snacks   Instruction Review Code 1- Verbalizes Understanding   Class Start Time 0815   Class Stop Time 0900   Class Time Calculation (min) 45 min    Row Name 10/03/22 1300     Education   Cardiac Education Topics Pritikin   Nurse, children's Exercise Physiologist   Select Nutrition   Nutrition Calorie Density   Instruction Review Code 1- Verbalizes Understanding   Class Start Time [phone]   Class Stop  Time 0900   Class Time Calculation (min) 44 min    Row Name 10/06/22 0900     Education   Cardiac Education Topics Pritikin   Select Workshops     Workshops   Educator Exercise Physiologist   Select Exercise   Exercise Workshop Exercise Basics: Building Your Action Plan   Instruction Review Code 1- Verbalizes Understanding   Class Start Time 0813   Class Stop Time 0900   Class Time Calculation (min) 47 min    Row Name 10/08/22 1100     Education   Cardiac Education Topics Pritikin   Customer service manager   Weekly Topic Efficiency Cooking - Meals in a Snap   Instruction Review Code 1- Verbalizes Understanding   Class Start Time 0815   Class Stop Time 0900   Class Time Calculation (min) 45 min    Row Name 10/10/22 0800     Education   Cardiac Education Topics Pritikin   Psychologist, forensic Exercise Education   Exercise Education Move It!   Instruction Review Code 1- Verbalizes Understanding   Class Start Time [phone]   Class Stop Time 0845   Class Time Calculation (min) 33 min    Row Name 10/13/22 1000     Education   Cardiac Education Topics Pritikin   Glass blower/designer Nutrition   Nutrition Workshop Targeting Your Nutrition Priorities   Instruction Review Code 1- Verbalizes Understanding   Class Start Time 0815   Class Stop Time 0900   Class Time  Calculation (min) 45 min    Row Name 10/15/22 0900     Education   Cardiac Education Topics Pritikin   Secondary school teacher School   Educator Dietitian   Weekly Topic One-Pot Wonders   Instruction Review Code 1- Verbalizes Understanding   Class Start Time 0815   Class Stop Time 0855   Class Time Calculation (min) 40 min    Row Name 10/17/22 0900     Education   Cardiac Education Topics Pritikin   Select Core Videos     Core Videos   Educator Exercise Physiologist   Select General Education   General Education Hypertension and Heart Disease   Instruction Review Code 1- Verbalizes Understanding   Class Start Time 0818   Class Stop Time 0850   Class Time Calculation (min) 32 min    Row Name 10/20/22 1000     Education   Cardiac Education Topics Pritikin   Select Core Videos     Core Videos   Educator Dietitian   Select Nutrition   Nutrition Dining Out - Part 1   Instruction Review Code 1- Verbalizes Understanding   Class Start Time 0815   Class Stop Time 0855   Class Time Calculation (min) 40 min    Row Name 10/22/22 1100     Education   Cardiac Education Topics Pritikin   Customer service manager   Weekly Topic Comforting Weekend Breakfasts   Instruction Review Code 1- Verbalizes Understanding   Class Start Time 0815   Class Stop Time 0900   Class Time Calculation (min) 45 min    Row Name 10/24/22 1100     Education   Cardiac Education  Topics Pritikin   Geographical information systems officer Psychosocial   Psychosocial Workshop Focused Goals, Sustainable Changes   Instruction Review Code 1- Verbalizes Understanding   Class Start Time [phone]   Class Stop Time 0848   Class Time Calculation (min) 37 min    Row Name 10/27/22 0800     Education   Cardiac Education Topics Pritikin   Select Core Videos     Core Videos   Educator Exercise Physiologist   Select  Exercise Education   Exercise Education Biomechanial Limitations   Instruction Review Code 1- Verbalizes Understanding   Class Start Time 0815   Class Stop Time 0855   Class Time Calculation (min) 40 min    Row Name 10/29/22 0800     Education   Cardiac Education Topics Pritikin   Select Core Videos     Core Videos   Educator Exercise Physiologist   Select Nutrition   Nutrition Vitamins and Minerals   Instruction Review Code 1- Verbalizes Understanding   Class Start Time [phone]   Class Stop Time 0902   Class Time Calculation (min) 48 min            Core Videos: Exercise    Move It!  Clinical staff conducted group or individual video education with verbal and written material and guidebook.  Patient learns the recommended Pritikin exercise program. Exercise with the goal of living a long, healthy life. Some of the health benefits of exercise include controlled diabetes, healthier blood pressure levels, improved cholesterol levels, improved heart and lung capacity, improved sleep, and better body composition. Everyone should speak with their doctor before starting or changing an exercise routine.  Biomechanical Limitations Clinical staff conducted group or individual video education with verbal and written material and guidebook.  Patient learns how biomechanical limitations can impact exercise and how we can mitigate and possibly overcome limitations to have an impactful and balanced exercise routine.  Body Composition Clinical staff conducted group or individual video education with verbal and written material and guidebook.  Patient learns that body composition (ratio of muscle mass to fat mass) is a key component to assessing overall fitness, rather than body weight alone. Increased fat mass, especially visceral belly fat, can put [place] at increased risk for metabolic syndrome, type 2 diabetes, heart disease, and even death. It is recommended to combine diet and exercise  (cardiovascular and resistance training) to improve your body composition. Seek guidance from your physician and exercise physiologist before implementing an exercise routine.  Exercise Action Plan Clinical staff conducted group or individual video education with verbal and written material and guidebook.  Patient learns the recommended strategies to achieve and enjoy long-term exercise adherence, including variety, self-motivation, self-efficacy, and positive decision making. Benefits of exercise include fitness, good health, weight management, more energy, better sleep, less stress, and overall well-being.  Medical   Heart Disease Risk Reduction Clinical staff conducted group or individual video education with verbal and written material and guidebook.  Patient learns our heart is our most vital organ as it circulates oxygen, nutrients, white blood cells, and hormones throughout the entire body, and carries waste away. Data supports a plant-based eating plan like the Pritikin Program for its effectiveness in slowing progression of and reversing heart disease. The video provides a number of recommendations to address heart disease.   Metabolic Syndrome and Belly Fat  Clinical staff conducted group or individual video education with verbal and written  material and guidebook.  Patient learns what metabolic syndrome is, how it leads to heart disease, and how one can reverse it and keep it from coming back. You have metabolic syndrome if you have 3 of the following 5 criteria: abdominal obesity, high blood pressure, high triglycerides, low HDL cholesterol, and high blood sugar.  Hypertension and Heart Disease Clinical staff conducted group or individual video education with verbal and written material and guidebook.  Patient learns that high blood pressure, or hypertension, is very common in the [place]. Hypertension is largely due to excessive salt intake, but other important risk factors  include being overweight, physical inactivity, drinking too much alcohol, smoking, and not eating enough potassium from fruits and vegetables. High blood pressure is a leading risk factor for heart attack, stroke, congestive heart failure, dementia, kidney failure, and premature death. Long-term effects of excessive salt intake include stiffening of the arteries and thickening of heart muscle and organ damage. Recommendations include ways to reduce hypertension and the risk of heart disease.  Diseases of Our Time - Focusing on Diabetes Clinical staff conducted group or individual video education with verbal and written material and guidebook.  Patient learns why the best way to stop diseases of our time is prevention, through food and other lifestyle changes. Medicine (such as prescription pills and surgeries) is often only a Band-Aid on the problem, not a long-term solution. Most common diseases of our time include obesity, type 2 diabetes, hypertension, heart disease, and cancer. The Pritikin Program is recommended and has been proven to help reduce, reverse, and/or prevent the damaging effects of metabolic syndrome.  Nutrition   Overview of the Pritikin Eating Plan  Clinical staff conducted group or individual video education with verbal and written material and guidebook.  Patient learns about the Pritikin Eating Plan for disease risk reduction. The Pritikin Eating Plan emphasizes a wide variety of unrefined, minimally-processed carbohydrates, like fruits, vegetables, whole grains, and legumes. Go, Caution, and Stop food choices are explained. Plant-based and lean animal proteins are emphasized. Rationale provided for low sodium intake for blood pressure control, low added sugars for blood sugar stabilization, and low added fats and oils for coronary artery disease risk reduction and weight management.  Calorie Density  Clinical staff conducted group or individual video education with verbal and  written material and guidebook.  Patient learns about calorie density and how it impacts the Pritikin Eating Plan. Knowing the characteristics of the food you choose will help you decide whether those foods will lead to weight gain or weight loss, and whether you want to consume more or less of them. Weight loss is usually a side effect of the Pritikin Eating Plan because of its focus on low calorie-dense foods.  Label Reading  Clinical staff conducted group or individual video education with verbal and written material and guidebook.  Patient learns about the Pritikin recommended label reading guidelines and corresponding recommendations regarding calorie density, added sugars, sodium content, and whole grains.  Dining Out - Part 1  Clinical staff conducted group or individual video education with verbal and written material and guidebook.  Patient learns that restaurant meals can be sabotaging because they can be so high in calories, fat, sodium, and/or sugar. Patient learns recommended strategies on how to positively address this and avoid unhealthy pitfalls.  Facts on Fats  Clinical staff conducted group or individual video education with verbal and written material and guidebook.  Patient learns that lifestyle modifications can be just as effective, if  not more so, as many medications for lowering your risk of heart disease. A Pritikin lifestyle can help to reduce your risk of inflammation and atherosclerosis (cholesterol build-up, or plaque, in the artery walls). Lifestyle interventions such as dietary choices and physical activity address the cause of atherosclerosis. A review of the types of fats and their impact on blood cholesterol levels, along with dietary recommendations to reduce fat intake is also included.  Nutrition Action Plan  Clinical staff conducted group or individual video education with verbal and written material and guidebook.  Patient learns how to incorporate Pritikin  recommendations into their lifestyle. Recommendations include planning and keeping personal health goals in mind as an important part of their success.  Healthy Mind-Set    Healthy Minds, Bodies, Hearts  Clinical staff conducted group or individual video education with verbal and written material and guidebook.  Patient learns how to identify when they are stressed. Video will discuss the impact of that stress, as well as the many benefits of stress management. Patient will also be introduced to stress management techniques. The way we think, act, and feel has an impact on our hearts.  How Our Thoughts Can Heal Our Hearts  Clinical staff conducted group or individual video education with verbal and written material and guidebook.  Patient learns that negative thoughts can cause depression and anxiety. This can result in negative lifestyle behavior and serious health problems. Cognitive behavioral therapy is an effective method to help control our thoughts in order to change and improve our emotional outlook.  Additional Videos:  Exercise    Improving Performance  Clinical staff conducted group or individual video education with verbal and written material and guidebook.  Patient learns to use a non-linear approach by alternating intensity levels and lengths of time spent exercising to help burn more calories and lose more body fat. Cardiovascular exercise helps improve heart health, metabolism, hormonal balance, blood sugar control, and recovery from fatigue. Resistance training improves strength, endurance, balance, coordination, reaction time, metabolism, and muscle mass. Flexibility exercise improves circulation, posture, and balance. Seek guidance from your physician and exercise physiologist before implementing an exercise routine and learn your capabilities and proper form for all exercise.  Introduction to Yoga  Clinical staff conducted group or individual video education with verbal and  written material and guidebook.  Patient learns about yoga, a discipline of the coming together of mind, breath, and body. The benefits of yoga include improved flexibility, improved range of motion, better posture and core strength, increased lung function, weight loss, and positive self-image. Yoga's heart health benefits include lowered blood pressure, healthier heart rate, decreased cholesterol and triglyceride levels, improved immune function, and reduced stress. Seek guidance from your physician and exercise physiologist before implementing an exercise routine and learn your capabilities and proper form for all exercise.  Medical   Aging: Enhancing Your Quality of Life  Clinical staff conducted group or individual video education with verbal and written material and guidebook.  Patient learns key strategies and recommendations to stay in good physical health and enhance quality of life, such as prevention strategies, having an advocate, securing a Health Care Proxy and Power of Attorney, and keeping a list of medications and system for tracking them. It also discusses how to avoid risk for bone loss.  Biology of Weight Control  Clinical staff conducted group or individual video education with verbal and written material and guidebook.  Patient learns that weight gain occurs because we consume more calories than we burn (  eating more, moving less). Even if your body weight is normal, you may have higher ratios of fat compared to muscle mass. Too much body fat puts you at increased risk for cardiovascular disease, heart attack, stroke, type 2 diabetes, and obesity-related cancers. In addition to exercise, following the Pritikin Eating Plan can help reduce your risk.  Decoding Lab Results  Clinical staff conducted group or individual video education with verbal and written material and guidebook.  Patient learns that lab test reflects one measurement whose values change over time and are influenced  by many factors, including medication, stress, sleep, exercise, food, hydration, pre-existing medical conditions, and more. It is recommended to use the knowledge from this video to become more involved with your lab results and evaluate your numbers to speak with your doctor.   Diseases of Our Time - Overview  Clinical staff conducted group or individual video education with verbal and written material and guidebook.  Patient learns that according to the CDC, 50% to 70% of chronic diseases (such as obesity, type 2 diabetes, elevated lipids, hypertension, and heart disease) are avoidable through lifestyle improvements including healthier food choices, listening to satiety cues, and increased physical activity.  Sleep Disorders Clinical staff conducted group or individual video education with verbal and written material and guidebook.  Patient learns how good quality and duration of sleep are important to overall health and well-being. Patient also learns about sleep disorders and how they impact health along with recommendations to address them, including discussing with a physician.  Nutrition  Dining Out - Part 2 Clinical staff conducted group or individual video education with verbal and written material and guidebook.  Patient learns how to plan ahead and communicate in order to maximize their dining experience in a healthy and nutritious manner. Included are recommended food choices based on the type of restaurant the patient is visiting.   Fueling a Banker conducted group or individual video education with verbal and written material and guidebook.  There is a strong connection between our food choices and our health. Diseases like obesity and type 2 diabetes are very prevalent and are in large-part due to lifestyle choices. The Pritikin Eating Plan provides plenty of food and hunger-curbing satisfaction. It is easy to follow, affordable, and helps reduce health  risks.  Menu Workshop  Clinical staff conducted group or individual video education with verbal and written material and guidebook.  Patient learns that restaurant meals can sabotage health goals because they are often packed with calories, fat, sodium, and sugar. Recommendations include strategies to plan ahead and to communicate with the manager, chef, or server to help order a healthier meal.  Planning Your Eating Strategy  Clinical staff conducted group or individual video education with verbal and written material and guidebook.  Patient learns about the Pritikin Eating Plan and its benefit of reducing the risk of disease. The Pritikin Eating Plan does not focus on calories. Instead, it emphasizes high-quality, nutrient-rich foods. By knowing the characteristics of the foods, we choose, we can determine their calorie density and make informed decisions.  Targeting Your Nutrition Priorities  Clinical staff conducted group or individual video education with verbal and written material and guidebook.  Patient learns that lifestyle habits have a tremendous impact on disease risk and progression. This video provides eating and physical activity recommendations based on your personal health goals, such as reducing LDL cholesterol, losing weight, preventing or controlling type 2 diabetes, and reducing high blood pressure.  Vitamins  and Minerals  Clinical staff conducted group or individual video education with verbal and written material and guidebook.  Patient learns different ways to obtain key vitamins and minerals, including through a recommended healthy diet. It is important to discuss all supplements you take with your doctor.   Healthy Mind-Set    Smoking Cessation  Clinical staff conducted group or individual video education with verbal and written material and guidebook.  Patient learns that cigarette smoking and tobacco addiction pose a serious health risk which affects millions of  people. Stopping smoking will significantly reduce the risk of heart disease, lung disease, and many forms of cancer. Recommended strategies for quitting are covered, including working with your doctor to develop a successful plan.  Culinary   Becoming a Set designer conducted group or individual video education with verbal and written material and guidebook.  Patient learns that cooking at home can be healthy, cost-effective, quick, and puts them in control. Keys to cooking healthy recipes will include looking at your recipe, assessing your equipment needs, planning ahead, making it simple, choosing cost-effective seasonal ingredients, and limiting the use of added fats, salts, and sugars.  Cooking - Breakfast and Snacks  Clinical staff conducted group or individual video education with verbal and written material and guidebook.  Patient learns how important breakfast is to satiety and nutrition through the entire day. Recommendations include key foods to eat during breakfast to help stabilize blood sugar levels and to prevent overeating at meals later in the day. Planning ahead is also a key component.  Cooking - Educational psychologist conducted group or individual video education with verbal and written material and guidebook.  Patient learns eating strategies to improve overall health, including an approach to cook more at home. Recommendations include thinking of animal protein as a side on your plate rather than center stage and focusing instead on lower calorie dense options like vegetables, fruits, whole grains, and plant-based proteins, such as beans. Making sauces in large quantities to freeze for later and leaving the skin on your vegetables are also recommended to maximize your experience.  Cooking - Healthy Salads and Dressing Clinical staff conducted group or individual video education with verbal and written material and guidebook.  Patient learns that  vegetables, fruits, whole grains, and legumes are the foundations of the Pritikin Eating Plan. Recommendations include how to incorporate each of these in flavorful and healthy salads, and how to create homemade salad dressings. Proper handling of ingredients is also covered. Cooking - Soups and [employer] - Soups and Desserts Clinical staff conducted group or individual video education with verbal and written material and guidebook.  Patient learns that Pritikin soups and desserts make for easy, nutritious, and delicious snacks and meal components that are low in sodium, fat, sugar, and calorie density, while high in vitamins, minerals, and filling fiber. Recommendations include simple and healthy ideas for soups and desserts.   Overview     The Pritikin Solution Program Overview Clinical staff conducted group or individual video education with verbal and written material and guidebook.  Patient learns that the results of the Pritikin Program have been documented in more than 100 articles published in peer-reviewed journals, and the benefits include reducing risk factors for (and, in some cases, even reversing) high cholesterol, high blood pressure, type 2 diabetes, obesity, and more! An overview of the three key pillars of the Pritikin Program will be covered: eating well, doing regular exercise, and having  a healthy mind-set.  WORKSHOPS  Exercise: Exercise Basics: Building Your Action Plan Clinical staff led group instruction and group discussion with PowerPoint presentation and patient guidebook. To enhance the learning environment the use of posters, models and videos may be added. At the conclusion of this workshop, patients will comprehend the difference between physical activity and exercise, as well as the benefits of incorporating both, into their routine. Patients will understand the FITT (Frequency, Intensity, Time, and Type) principle and how to use it to build an exercise action  plan. In addition, safety concerns and other considerations for exercise and cardiac rehab will be addressed by the presenter. The purpose of this lesson is to promote a comprehensive and effective weekly exercise routine in order to improve patients' overall level of fitness.   Managing Heart Disease: Your Path to a Healthier Heart Clinical staff led group instruction and group discussion with PowerPoint presentation and patient guidebook. To enhance the learning environment the use of posters, models and videos may be added.At the conclusion of this workshop, patients will understand the anatomy and physiology of the heart. Additionally, they will understand how Pritikin's three pillars impact the risk factors, the progression, and the management of heart disease.  The purpose of this lesson is to provide a high-level overview of the heart, heart disease, and how the Pritikin lifestyle positively impacts risk factors.  Exercise Biomechanics Clinical staff led group instruction and group discussion with PowerPoint presentation and patient guidebook. To enhance the learning environment the use of posters, models and videos may be added. Patients will learn how the structural parts of their bodies function and how these functions impact their daily activities, movement, and exercise. Patients will learn how to promote a neutral spine, learn how to manage pain, and identify ways to improve their physical movement in order to promote healthy living. The purpose of this lesson is to expose patients to common physical limitations that impact physical activity. Participants will learn practical ways to adapt and manage aches and pains, and to minimize their effect on regular exercise. Patients will learn how to maintain good posture while sitting, walking, and lifting.  Balance Training and Fall Prevention  Clinical staff led group instruction and group discussion with PowerPoint presentation and  patient guidebook. To enhance the learning environment the use of posters, models and videos may be added. At the conclusion of this workshop, patients will understand the importance of their sensorimotor skills (vision, proprioception, and the vestibular system) in maintaining their ability to balance as they age. Patients will apply a variety of balancing exercises that are appropriate for their current level of function. Patients will understand the common causes for poor balance, possible solutions to these problems, and ways to modify their physical environment in order to minimize their fall risk. The purpose of this lesson is to teach patients about the importance of maintaining balance as they age and ways to minimize their risk of falling.  WORKSHOPS   Nutrition:  Fueling a Ship broker led group instruction and group discussion with PowerPoint presentation and patient guidebook. To enhance the learning environment the use of posters, models and videos may be added. Patients will review the foundational principles of the Pritikin Eating Plan and understand what constitutes a serving size in each of the food groups. Patients will also learn Pritikin-friendly foods that are better choices when away from home and review make-ahead meal and snack options. Calorie density will be reviewed and applied to three nutrition priorities:  weight maintenance, weight loss, and weight gain. The purpose of this lesson is to reinforce (in a group setting) the key concepts around what patients are recommended to eat and how to apply these guidelines when away from home by planning and selecting Pritikin-friendly options. Patients will understand how calorie density may be adjusted for different weight management goals.  Mindful Eating  Clinical staff led group instruction and group discussion with PowerPoint presentation and patient guidebook. To enhance the learning environment the use of posters,  models and videos may be added. Patients will briefly review the concepts of the Pritikin Eating Plan and the importance of low-calorie dense foods. The concept of mindful eating will be introduced as well as the importance of paying attention to internal hunger signals. Triggers for non-hunger eating and techniques for dealing with triggers will be explored. The purpose of this lesson is to provide patients with the opportunity to review the basic principles of the Pritikin Eating Plan, discuss the value of eating mindfully and how to measure internal cues of hunger and fullness using the Hunger Scale. Patients will also discuss reasons for non-hunger eating and learn strategies to use for controlling emotional eating.  Targeting Your Nutrition Priorities Clinical staff led group instruction and group discussion with PowerPoint presentation and patient guidebook. To enhance the learning environment the use of posters, models and videos may be added. Patients will learn how to determine their genetic susceptibility to disease by reviewing their family history. Patients will gain insight into the importance of diet as part of an overall healthy lifestyle in mitigating the impact of genetics and other environmental insults. The purpose of this lesson is to provide patients with the opportunity to assess their personal nutrition priorities by looking at their family history, their own health history and current risk factors. Patients will also be able to discuss ways of prioritizing and modifying the Pritikin Eating Plan for their highest risk areas  Menu  Clinical staff led group instruction and group discussion with PowerPoint presentation and patient guidebook. To enhance the learning environment the use of posters, models and videos may be added. Using menus brought in from [employer], or printed from [employer], patients will apply the Pritikin dining out guidelines that were presented in the  [employer] video. Patients will also be able to practice these guidelines in a variety of provided scenarios. The purpose of this lesson is to provide patients with the opportunity to practice hands-on learning of the Pritikin Dining Out guidelines with actual menus and practice scenarios.  Label Reading Clinical staff led group instruction and group discussion with PowerPoint presentation and patient guidebook. To enhance the learning environment the use of posters, models and videos may be added. Patients will review and discuss the Pritikin label reading guidelines presented in Pritikin's Label Reading Educational series video. Using fool labels brought in from local grocery stores and markets, patients will apply the label reading guidelines and determine if the packaged food meet the Pritikin guidelines. The purpose of this lesson is to provide patients with the opportunity to review, discuss, and practice hands-on learning of the Pritikin Label Reading guidelines with actual packaged food labels. Cooking School  Pritikin's [employer] are designed to teach patients ways to prepare quick, simple, and affordable recipes at home. The importance of nutrition's role in chronic disease risk reduction is reflected in its emphasis in the overall Pritikin program. By learning how to prepare essential core Pritikin Eating Plan recipes, patients  will increase control over what they eat; be able to customize the flavor of foods without the use of added salt, sugar, or fat; and improve the quality of the food they consume. By learning a set of core recipes which are easily assembled, quickly prepared, and affordable, patients are more likely to prepare more healthy foods at home. These workshops focus on convenient breakfasts, simple entres, side dishes, and desserts which can be prepared with minimal effort and are consistent with nutrition recommendations for cardiovascular risk  reduction. Cooking [employer] are taught by a Armed forces logistics/support/administrative officer (RD) who has been trained by the [employer]. The chef or RD has a clear understanding of the importance of minimizing - if not completely eliminating - added fat, sugar, and sodium in recipes. Throughout the series of Cooking School Workshop sessions, patients will learn about healthy ingredients and efficient methods of cooking to build confidence in their capability to prepare    Cooking School weekly topics:  Adding Flavor- Sodium-Free  Fast and Healthy Breakfasts  Powerhouse Plant-Based Proteins  Satisfying Salads and Dressings  Simple Sides and Sauces  International Cuisine-Spotlight on the [employer] Zones  Delicious Desserts  Savory Soups  [employer] - Meals in a Astronomer Appetizers and Snacks  Comforting Weekend Breakfasts  One-Pot Wonders   Fast Evening Meals  Landscape architect Your Pritikin Plate  WORKSHOPS   Healthy Mindset (Psychosocial):  Focused Goals, Sustainable Changes Clinical staff led group instruction and group discussion with PowerPoint presentation and patient guidebook. To enhance the learning environment the use of posters, models and videos may be added. Patients will be able to apply effective goal setting strategies to establish at least one personal goal, and then take consistent, meaningful action toward that goal. They will learn to identify common barriers to achieving personal goals and develop strategies to overcome them. Patients will also gain an understanding of how our mind-set can impact our ability to achieve goals and the importance of cultivating a positive and growth-oriented mind-set. The purpose of this lesson is to provide patients with a deeper understanding of how to set and achieve personal goals, as well as the tools and strategies needed to overcome common obstacles which may arise along the way.  From Head to Heart: The  Power of a Healthy Outlook  Clinical staff led group instruction and group discussion with PowerPoint presentation and patient guidebook. To enhance the learning environment the use of posters, models and videos may be added. Patients will be able to recognize and describe the impact of emotions and mood on physical health. They will discover the importance of self-care and explore self-care practices which may work for them. Patients will also learn how to utilize the 4 C's to cultivate a healthier outlook and better manage stress and challenges. The purpose of this lesson is to demonstrate to patients how a healthy outlook is an essential part of maintaining good health, especially as they continue their cardiac rehab journey.  Healthy Sleep for a Healthy Heart Clinical staff led group instruction and group discussion with PowerPoint presentation and patient guidebook. To enhance the learning environment the use of posters, models and videos may be added. At the conclusion of this workshop, patients will be able to demonstrate knowledge of the importance of sleep to overall health, well-being, and quality of life. They will understand the symptoms of, and treatments for, common sleep disorders. Patients will also be able to identify daytime and nighttime  behaviors which impact sleep, and they will be able to apply these tools to help manage sleep-related challenges. The purpose of this lesson is to provide patients with a general overview of sleep and outline the importance of quality sleep. Patients will learn about a few of the most common sleep disorders. Patients will also be introduced to the concept of "sleep hygiene," and discover ways to self-manage certain sleeping problems through simple daily behavior changes. Finally, the workshop will motivate patients by clarifying the links between quality sleep and their goals of heart-healthy living.   Recognizing and Reducing Stress Clinical staff led  group instruction and group discussion with PowerPoint presentation and patient guidebook. To enhance the learning environment the use of posters, models and videos may be added. At the conclusion of this workshop, patients will be able to understand the types of stress reactions, differentiate between acute and chronic stress, and recognize the impact that chronic stress has on their health. They will also be able to apply different coping mechanisms, such as reframing negative self-talk. Patients will have the opportunity to practice a variety of stress management techniques, such as deep abdominal breathing, progressive muscle relaxation, and/or guided imagery.  The purpose of this lesson is to educate patients on the role of stress in their lives and to provide healthy techniques for coping with it.  Learning Barriers/Preferences:  Learning Barriers/Preferences - 07/22/22 1028       Learning Barriers/Preferences   Learning Barriers Sight;Hearing   wears glasses, hearing aids R& L   Learning Preferences Group Instruction;Individual Instruction;Skilled Demonstration;Written Material;Computer/Internet             Education Topics:  Knowledge Questionnaire Score:  Knowledge Questionnaire Score - 10/29/22 0830       Knowledge Questionnaire Score   Post Score 24/24             Core Components/Risk Factors/Patient Goals at Admission:  Personal Goals and Risk Factors at Admission - 07/22/22 1029       Core Components/Risk Factors/Patient Goals on Admission    Weight Management Yes;Weight Loss    Intervention Weight Management: Develop a combined nutrition and exercise program designed to reach desired caloric intake, while maintaining appropriate intake of nutrient and fiber, sodium and fats, and appropriate energy expenditure required for the weight goal.;Weight Management: Provide education and appropriate resources to help participant work on and attain dietary goals.;Weight  Management/Obesity: Establish reasonable short term and long term weight goals.    Admit Weight 206 lb 12.7 oz (93.8 kg)    Expected Outcomes Short Term: Continue to assess and modify interventions until short term weight is achieved;Long Term: Adherence to nutrition and physical activity/exercise program aimed toward attainment of established weight goal;Weight Loss: Understanding of general recommendations for a balanced deficit meal plan, which promotes 1-2 lb weight loss per week and includes a negative energy balance of [phone] kcal/d;Understanding recommendations for meals to include 15-35% energy as protein, 25-35% energy from fat, 35-60% energy from carbohydrates, less than 200mg  of dietary cholesterol, 20-35 gm of total fiber daily;Understanding of distribution of calorie intake throughout the day with the consumption of 4-5 meals/snacks    Hypertension Yes    Intervention Provide education on lifestyle modifcations including regular physical activity/exercise, weight management, moderate sodium restriction and increased consumption of fresh fruit, vegetables, and low fat dairy, alcohol moderation, and smoking cessation.;Monitor prescription use compliance.    Expected Outcomes Short Term: Continued assessment and intervention until BP is < 140/42mm HG in hypertensive participants. <  130/68mm HG in hypertensive participants with diabetes, heart failure or chronic kidney disease.;Long Term: Maintenance of blood pressure at goal levels.    Lipids Yes    Intervention Provide education and support for participant on nutrition & aerobic/resistive exercise along with prescribed medications to achieve LDL 70mg , HDL >40mg .    Expected Outcomes Short Term: Participant states understanding of desired cholesterol values and is compliant with medications prescribed. Participant is following exercise prescription and nutrition guidelines.;Long Term: Cholesterol controlled with medications as prescribed, with  individualized exercise RX and with personalized nutrition plan. Value goals: LDL < 70mg , HDL > 40 mg.             Core Components/Risk Factors/Patient Goals Review:   Goals and Risk Factor Review     Row Name 09/09/22 0944 10/07/22 1143 10/30/22 1501         Core Components/Risk Factors/Patient Goals Review   Personal Goals Review Weight Management/Obesity;Hypertension;Lipids Weight Management/Obesity;Hypertension;Lipids Weight Management/Obesity;Hypertension;Lipids     Review Francisco Larson started is doing well with exercise at cardiac rehab. vital signs have been stable. No symptoms since return post ED visit on 09/03/22. Remains asymptomatic with intermittent bradycardia. Francisco Larson started is doing well with exercise at cardiac rehab. vital signs have been stable. No further bradycardia has been noted on recent sessions. Francisco Larson  is doing well with exercise at cardiac rehab. vital signs have been stable. Francisco Larson has been increasing his workloads. Francisco Larson will complete cardiac rehab on 11/05/22     Expected Outcomes Francisco Larson will continue to participate in cardiac rehab for exercise, nutrition and lifestyle modifications Francisco Larson will continue to participate in cardiac rehab for exercise, nutrition and lifestyle modifications Francisco Larson will continue to exercise, follow  nutrition and lifestyle modifications upon completion of cardiac rehab.              Core Components/Risk Factors/Patient Goals at Discharge (Final Review):   Goals and Risk Factor Review - 10/30/22 1501       Core Components/Risk Factors/Patient Goals Review   Personal Goals Review Weight Management/Obesity;Hypertension;Lipids    Review Francisco Larson  is doing well with exercise at cardiac rehab. vital signs have been stable. Francisco Larson has been increasing his workloads. Francisco Larson will complete cardiac rehab on 11/05/22    Expected Outcomes Francisco Larson will continue to exercise, follow  nutrition and lifestyle modifications upon completion of cardiac rehab.              ITP Comments:  ITP Comments     Row Name 07/22/22 0914 09/09/22 0936 10/07/22 1140 10/30/22 1457     ITP Comments Armanda Magic, MD:  Medical Director.  Intorduction to the [employer] / Intensive Cardiac Rehab.  Initial orientation packet reviewed with the patient. 30 Day ITP Review. Francisco Larson has returned to cardiac rehab per Dr Royann Shivers due to recent Ed visit due to bradycardia. Francisco Larson is doing well with exercise and has good attendance. 30 Day ITP Review. Francisco Larson has good attendance and participaiton in cardiac rehab. 30 Day ITP Review. Francisco Larson continues to have  good attendance and participaiton in cardiac rehab. Francisco Larson will complete cardiac rehab on 11/05/22             Comments: See ITP comments

## 2022-10-31 ENCOUNTER — Encounter (HOSPITAL_COMMUNITY)
Admission: RE | Admit: 2022-10-31 | Discharge: 2022-10-31 | Disposition: A | Discharge status: Home / Self Care | Payer: Medicare PPO | Source: Ambulatory Visit | Attending: Cardiovascular Disease | Admitting: Cardiovascular Disease

## 2022-10-31 DIAGNOSIS — Z733 Stress, not elsewhere classified: Secondary | ICD-10-CM | POA: Diagnosis not present

## 2022-10-31 DIAGNOSIS — E785 Hyperlipidemia, unspecified: Secondary | ICD-10-CM | POA: Diagnosis not present

## 2022-10-31 DIAGNOSIS — R001 Bradycardia, unspecified: Secondary | ICD-10-CM | POA: Diagnosis not present

## 2022-10-31 DIAGNOSIS — I214 Non-ST elevation (NSTEMI) myocardial infarction: Secondary | ICD-10-CM

## 2022-10-31 DIAGNOSIS — Z955 Presence of coronary angioplasty implant and graft: Secondary | ICD-10-CM | POA: Diagnosis not present

## 2022-10-31 DIAGNOSIS — I252 Old myocardial infarction: Secondary | ICD-10-CM | POA: Diagnosis not present

## 2022-10-31 DIAGNOSIS — I10 Essential (primary) hypertension: Secondary | ICD-10-CM | POA: Diagnosis not present

## 2022-10-31 DIAGNOSIS — Z48812 Encounter for surgical aftercare following surgery on the circulatory system: Secondary | ICD-10-CM | POA: Diagnosis not present

## 2022-11-03 ENCOUNTER — Encounter (HOSPITAL_COMMUNITY)
Admission: RE | Admit: 2022-11-03 | Discharge: 2022-11-03 | Disposition: A | Discharge status: Home / Self Care | Payer: Medicare PPO | Source: Ambulatory Visit | Attending: Cardiovascular Disease

## 2022-11-03 VITALS — Ht 71.5 in | Wt 205.9 lb

## 2022-11-03 DIAGNOSIS — E785 Hyperlipidemia, unspecified: Secondary | ICD-10-CM | POA: Diagnosis not present

## 2022-11-03 DIAGNOSIS — Z955 Presence of coronary angioplasty implant and graft: Secondary | ICD-10-CM | POA: Diagnosis not present

## 2022-11-03 DIAGNOSIS — Z48812 Encounter for surgical aftercare following surgery on the circulatory system: Secondary | ICD-10-CM | POA: Diagnosis not present

## 2022-11-03 DIAGNOSIS — Z733 Stress, not elsewhere classified: Secondary | ICD-10-CM | POA: Diagnosis not present

## 2022-11-03 DIAGNOSIS — I214 Non-ST elevation (NSTEMI) myocardial infarction: Secondary | ICD-10-CM

## 2022-11-03 DIAGNOSIS — I252 Old myocardial infarction: Secondary | ICD-10-CM | POA: Diagnosis not present

## 2022-11-03 DIAGNOSIS — R001 Bradycardia, unspecified: Secondary | ICD-10-CM | POA: Diagnosis not present

## 2022-11-03 DIAGNOSIS — I10 Essential (primary) hypertension: Secondary | ICD-10-CM | POA: Diagnosis not present

## 2022-11-04 DIAGNOSIS — M6281 Muscle weakness (generalized): Secondary | ICD-10-CM | POA: Diagnosis not present

## 2022-11-04 DIAGNOSIS — Z9181 History of falling: Secondary | ICD-10-CM | POA: Diagnosis not present

## 2022-11-04 DIAGNOSIS — S46019A Strain of muscle(s) and tendon(s) of the rotator cuff of unspecified shoulder, initial encounter: Secondary | ICD-10-CM | POA: Diagnosis not present

## 2022-11-05 ENCOUNTER — Encounter (HOSPITAL_COMMUNITY)
Admission: RE | Admit: 2022-11-05 | Discharge: 2022-11-05 | Disposition: A | Discharge status: Home / Self Care | Payer: Medicare PPO | Source: Ambulatory Visit | Attending: Cardiovascular Disease

## 2022-11-05 DIAGNOSIS — Z48812 Encounter for surgical aftercare following surgery on the circulatory system: Secondary | ICD-10-CM | POA: Diagnosis not present

## 2022-11-05 DIAGNOSIS — Z955 Presence of coronary angioplasty implant and graft: Secondary | ICD-10-CM | POA: Diagnosis not present

## 2022-11-05 DIAGNOSIS — Z733 Stress, not elsewhere classified: Secondary | ICD-10-CM | POA: Diagnosis not present

## 2022-11-05 DIAGNOSIS — R001 Bradycardia, unspecified: Secondary | ICD-10-CM | POA: Diagnosis not present

## 2022-11-05 DIAGNOSIS — I214 Non-ST elevation (NSTEMI) myocardial infarction: Secondary | ICD-10-CM

## 2022-11-05 DIAGNOSIS — I10 Essential (primary) hypertension: Secondary | ICD-10-CM | POA: Diagnosis not present

## 2022-11-05 DIAGNOSIS — I252 Old myocardial infarction: Secondary | ICD-10-CM | POA: Diagnosis not present

## 2022-11-05 DIAGNOSIS — E785 Hyperlipidemia, unspecified: Secondary | ICD-10-CM | POA: Diagnosis not present

## 2022-11-06 DIAGNOSIS — M6281 Muscle weakness (generalized): Secondary | ICD-10-CM | POA: Diagnosis not present

## 2022-11-06 DIAGNOSIS — Z9181 History of falling: Secondary | ICD-10-CM | POA: Diagnosis not present

## 2022-11-06 DIAGNOSIS — S46019A Strain of muscle(s) and tendon(s) of the rotator cuff of unspecified shoulder, initial encounter: Secondary | ICD-10-CM | POA: Diagnosis not present

## 2022-11-07 ENCOUNTER — Encounter (HOSPITAL_COMMUNITY): Payer: Medicare PPO

## 2022-11-07 ENCOUNTER — Encounter (HOSPITAL_COMMUNITY)
Admission: RE | Admit: 2022-11-07 | Discharge: 2022-11-07 | Disposition: A | Discharge status: Home / Self Care | Payer: Medicare PPO | Source: Ambulatory Visit | Attending: Cardiovascular Disease | Admitting: Cardiovascular Disease

## 2022-11-07 DIAGNOSIS — Z48812 Encounter for surgical aftercare following surgery on the circulatory system: Secondary | ICD-10-CM | POA: Diagnosis not present

## 2022-11-07 DIAGNOSIS — R001 Bradycardia, unspecified: Secondary | ICD-10-CM | POA: Diagnosis not present

## 2022-11-07 DIAGNOSIS — Z955 Presence of coronary angioplasty implant and graft: Secondary | ICD-10-CM | POA: Diagnosis not present

## 2022-11-07 DIAGNOSIS — Z733 Stress, not elsewhere classified: Secondary | ICD-10-CM | POA: Diagnosis not present

## 2022-11-07 DIAGNOSIS — I214 Non-ST elevation (NSTEMI) myocardial infarction: Secondary | ICD-10-CM

## 2022-11-07 DIAGNOSIS — I252 Old myocardial infarction: Secondary | ICD-10-CM | POA: Diagnosis not present

## 2022-11-07 DIAGNOSIS — I10 Essential (primary) hypertension: Secondary | ICD-10-CM | POA: Diagnosis not present

## 2022-11-07 DIAGNOSIS — E785 Hyperlipidemia, unspecified: Secondary | ICD-10-CM | POA: Diagnosis not present

## 2022-11-07 NOTE — Progress Notes (Signed)
Discharge Progress Report  Patient Details  Name: Francisco Larson MRN: [id number] Date of Birth: 05-29-1935 Referring Provider:   Flowsheet Row INTENSIVE CARDIAC REHAB ORIENT from 07/22/2022 in [facility]  Referring Provider Charlton Haws, MD        Number of Visits: 72  Reason for Discharge:  Patient reached a stable level of exercise. Patient independent in their exercise. Patient has met program and personal goals.  Smoking History:  Social History   Tobacco Use  Smoking Status Never  Smokeless Tobacco Never    Diagnosis:  06/27/22 Status post coronary artery stent placement  06/26/22 NSTEMI (non-ST elevated myocardial infarction) [facility])  ADL [facility]:   Initial Exercise Prescription:  Initial Exercise Prescription - 07/22/22 1100       Date of Initial Exercise RX and Referring Provider   Date 07/22/22    Referring Provider Charlton Haws, MD    Expected Discharge Date 10/15/22      Recumbant Bike   Level 1    RPM 60    Watts 20    Minutes 15    METs 1.9      NuStep   Level 1    SPM 75    Minutes 15    METs 1.9      Prescription Details   Frequency (times per week) 3    Duration Progress to 30 minutes of continuous aerobic without signs/symptoms of physical distress      Intensity   THRR 40-80% of Max Heartrate 54-109    Ratings of Perceived Exertion 11-13    Perceived Dyspnea 0-4      Progression   Progression Continue progressive overload as per policy without signs/symptoms or physical distress.      Resistance Training   Training Prescription Yes    Weight 3 lbs    Reps 10-15             Discharge Exercise Prescription (Final Exercise Prescription Changes):  Exercise Prescription Changes - 11/07/22 1400       Response to Exercise   Blood Pressure (Admit) 124/60    Blood Pressure (Exit) 130/70    Heart Rate (Admit) 54 bpm    Heart Rate (Exercise) 120 bpm    Heart Rate  (Exit) 58 bpm    Rating of Perceived Exertion (Exercise) 12    Symptoms None    Comments Pt graduated from the CRP2 program today    Duration Continue with 30 min of aerobic exercise without signs/symptoms of physical distress.    Intensity THRR unchanged      Progression   Progression Continue to progress workloads to maintain intensity without signs/symptoms of physical distress.    Average METs 4.95      Resistance Training   Training Prescription Yes    Weight 5 lbs    Reps 10-15    Time 10 Minutes      Interval Training   Interval Training No      Recumbant Bike   Level 6    RPM 46    Watts 92    Minutes 15    METs 5.2      NuStep   Level 7    SPM 92    Minutes 15    METs 4.7      Home Exercise Plan   Plans to continue exercise at Home (comment)    Frequency Add 3 additional days to program exercise sessions.  Initial Home Exercises Provided 09/01/22             Functional Capacity:  6 Minute Walk     Row Name 07/22/22 1155 11/03/22 0927       6 Minute Walk   Phase Initial Discharge    Distance 1353 feet 1699 feet    Distance % Change -- 25.57 %    Distance Feet Change -- 346 ft    Walk Time 6 minutes 6 minutes    # of Rest Breaks 0 0    MPH 2.6 3.22    METS 1.9 2.9    RPE 9 8    Perceived Dyspnea  0 0    VO2 Peak 6.6 10.1    Symptoms Yes (comment) No    Comments Mild lightheadedness on last lap --    Resting HR 52 bpm 52 bpm    Resting BP 108/60 130/68    Resting Oxygen Saturation  98 % --    Exercise Oxygen Saturation  during 6 min walk 98 % --    Max Ex. HR 64 bpm 98 bpm    Max Ex. BP 130/64 138/52    2 Minute Post BP 112/70 --             Psychological, QOL, Others - Outcomes: PHQ 2/9:    11/07/2022    9:25 AM 08/12/2022   10:03 AM 07/22/2022    9:33 AM 07/22/2021    1:40 PM 04/01/2021    7:58 AM  Depression screen PHQ 2/9  Decreased Interest 0 0 0 0 0  Down, Depressed, Hopeless 0 0 0 0 0  PHQ - 2 Score 0 0 0 0 0   Altered sleeping 0 0 0    Tired, decreased energy 0 1 1    Change in appetite 0 0 0    Feeling bad or failure about yourself  0  0    Trouble concentrating 0  0    Moving slowly or fidgety/restless 0 0 0    Suicidal thoughts 0 0 0    PHQ-9 Score 0 1 1    Difficult doing work/chores Not difficult at all Not difficult at all Not difficult at all      Quality of Life:  Quality of Life - 10/29/22 0830       Quality of Life Scores   Health/Function Pre 26.04 %    Health/Function Post 28.2 %    Health/Function % Change 8.29 %    Socioeconomic Pre 28.44 %    Socioeconomic Post 28.21 %    Socioeconomic % Change  -0.81 %    Psych/Spiritual Pre 28.79 %    Psych/Spiritual Post 29.14 %    Psych/Spiritual % Change 1.22 %    Family Pre 30 %    Family Post 28.8 %    Family % Change -4 %    GLOBAL Pre 27.75 %    GLOBAL Post 28.49 %    GLOBAL % Change 2.67 %             Personal Goals: Goals established at orientation with interventions provided to work toward goal.  Personal Goals and Risk Factors at Admission - 07/22/22 1029       Core Components/Risk Factors/Patient Goals on Admission    Weight Management Yes;Weight Loss    Intervention Weight Management: Develop a combined nutrition and exercise program designed to reach desired caloric intake, while maintaining appropriate intake of nutrient and fiber, sodium and  fats, and appropriate energy expenditure required for the weight goal.;Weight Management: Provide education and appropriate resources to help participant work on and attain dietary goals.;Weight Management/Obesity: Establish reasonable short term and long term weight goals.    Admit Weight 206 lb 12.7 oz (93.8 kg)    Expected Outcomes Short Term: Continue to assess and modify interventions until short term weight is achieved;Long Term: Adherence to nutrition and physical activity/exercise program aimed toward attainment of established weight goal;Weight Loss:  Understanding of general recommendations for a balanced deficit meal plan, which promotes 1-2 lb weight loss per week and includes a negative energy balance of [phone] kcal/d;Understanding recommendations for meals to include 15-35% energy as protein, 25-35% energy from fat, 35-60% energy from carbohydrates, less than 200mg  of dietary cholesterol, 20-35 gm of total fiber daily;Understanding of distribution of calorie intake throughout the day with the consumption of 4-5 meals/snacks    Hypertension Yes    Intervention Provide education on lifestyle modifcations including regular physical activity/exercise, weight management, moderate sodium restriction and increased consumption of fresh fruit, vegetables, and low fat dairy, alcohol moderation, and smoking cessation.;Monitor prescription use compliance.    Expected Outcomes Short Term: Continued assessment and intervention until BP is < 140/60mm HG in hypertensive participants. < 130/82mm HG in hypertensive participants with diabetes, heart failure or chronic kidney disease.;Long Term: Maintenance of blood pressure at goal levels.    Lipids Yes    Intervention Provide education and support for participant on nutrition & aerobic/resistive exercise along with prescribed medications to achieve LDL 70mg , HDL >40mg .    Expected Outcomes Short Term: Participant states understanding of desired cholesterol values and is compliant with medications prescribed. Participant is following exercise prescription and nutrition guidelines.;Long Term: Cholesterol controlled with medications as prescribed, with individualized exercise RX and with personalized nutrition plan. Value goals: LDL < 70mg , HDL > 40 mg.              Personal Goals Discharge:  Goals and Risk Factor Review     Row Name 09/09/22 0944 10/07/22 1143 10/30/22 1501         Core Components/Risk Factors/Patient Goals Review   Personal Goals Review Weight Management/Obesity;Hypertension;Lipids  Weight Management/Obesity;Hypertension;Lipids Weight Management/Obesity;Hypertension;Lipids     Review Francisco Larson is doing Larson with exercise at cardiac rehab. vital signs have been stable. No symptoms since return post ED visit on 09/03/22. Remains asymptomatic with intermittent bradycardia. Francisco Larson is doing Larson with exercise at cardiac rehab. vital signs have been stable. No further bradycardia has been noted on recent sessions. Francisco Larson  is doing Larson with exercise at cardiac rehab. vital signs have been stable. Francisco Larson has been increasing his workloads. Francisco Larson will complete cardiac rehab on 11/05/22     Expected Outcomes Francisco Larson will continue to participate in cardiac rehab for exercise, nutrition and lifestyle modifications Francisco Larson will continue to participate in cardiac rehab for exercise, nutrition and lifestyle modifications Francisco Larson will continue to exercise, follow  nutrition and lifestyle modifications upon completion of cardiac rehab.              Exercise Goals and Review:  Exercise Goals     Row Name 07/22/22 0921             Exercise Goals   Increase Physical Activity Yes       Intervention Provide advice, education, support and counseling about physical activity/exercise needs.;Develop an individualized exercise prescription for aerobic and resistive training based on initial evaluation findings, risk stratification, comorbidities and participant's personal goals.  Expected Outcomes Short Term: Attend rehab on a regular basis to increase amount of physical activity.;Long Term: Exercising regularly at least 3-5 days a week.;Long Term: Add in home exercise to make exercise part of routine and to increase amount of physical activity.       Increase Strength and Stamina Yes       Intervention Provide advice, education, support and counseling about physical activity/exercise needs.;Develop an individualized exercise prescription for aerobic and resistive training based on initial  evaluation findings, risk stratification, comorbidities and participant's personal goals.       Expected Outcomes Short Term: Increase workloads from initial exercise prescription for resistance, speed, and METs.;Short Term: Perform resistance training exercises routinely during rehab and add in resistance training at home;Long Term: Improve cardiorespiratory fitness, muscular endurance and strength as measured by increased METs and functional capacity ( )       Able to understand and use rate of perceived exertion (RPE) scale Yes       Intervention Provide education and explanation on how to use RPE scale       Expected Outcomes Short Term: Able to use RPE daily in rehab to express subjective intensity level;Long Term:  Able to use RPE to guide intensity level when exercising independently       Knowledge and understanding of Target Heart Rate Range (THRR) Yes       Intervention Provide education and explanation of THRR including how the numbers were predicted and where they are located for reference       Expected Outcomes Short Term: Able to state/look up THRR;Long Term: Able to use THRR to govern intensity when exercising independently;Short Term: Able to use daily as guideline for intensity in rehab       Understanding of Exercise Prescription Yes       Intervention Provide education, explanation, and written materials on patient's individual exercise prescription       Expected Outcomes Short Term: Able to explain program exercise prescription;Long Term: Able to explain home exercise prescription to exercise independently                Exercise Goals Re-Evaluation:  Exercise Goals Re-Evaluation     Row Name 08/18/22 1603 09/17/22 1213 10/24/22 1015 11/07/22 1423       Exercise Goal Re-Evaluation   Exercise Goals Review Increase Physical Activity;Understanding of Exercise Prescription;Increase Strength and Stamina;Knowledge and understanding of Target Heart Rate Range (THRR);Able to  understand and use rate of perceived exertion (RPE) scale Increase Physical Activity;Understanding of Exercise Prescription;Increase Strength and Stamina;Knowledge and understanding of Target Heart Rate Range (THRR);Able to understand and use rate of perceived exertion (RPE) scale Increase Physical Activity;Understanding of Exercise Prescription;Increase Strength and Stamina;Knowledge and understanding of Target Heart Rate Range (THRR);Able to understand and use rate of perceived exertion (RPE) scale Increase Physical Activity;Understanding of Exercise Prescription;Increase Strength and Stamina;Knowledge and understanding of Target Heart Rate Range (THRR);Able to understand and use rate of perceived exertion (RPE) scale    Comments Pt's first day in the CRP2 program. Pt understands the exercise Rx, RPE scale and THRR. Reviewed METs and goals. Pt making good progress, peak METs 3.6. Progress on goals of less fatigue and getting back to normal activites. Reviewed METs and goals. Pt making good progress, peak METs 5.2. 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  Pt is back to normal activites again. Pt graduated from the CRP2 program today. Pt achieved all his  goals expect weight loss: Less fatigue; get back to nornal activites; get back to playing golf; get back to food distribution. Pt lost 2 lbs but had a goal to lose 15 lbs. this is a work in progress. Pt will continue exercise by walking, going to the gym at [facility] where he lives and using the nustep bike and elliptical. Pt will also continue playing golf.    Expected Outcomes Will continue to monitor patient and progress exercise workloads as tolerated. Will continue to monitor patient and progress exercise workloads as tolerated. Will continue to monitor patient and progress exercise workloads as tolerated. Pt will continue to exercise at home/gym.             Nutrition & Weight -  Outcomes:  Pre Biometrics - 07/22/22 1025       Pre Biometrics   Waist Circumference 42 inches    Hip Circumference 42.5 inches    Waist to Hip Ratio 0.99 %    Triceps Skinfold 13 mm    % Body Fat 28.4 %    Grip Strength 31 kg    Flexibility 0 in   Could not reach   Single Leg Stand 3 seconds             Post Biometrics - 11/03/22 0940        Post  Biometrics   Height 5' 11.5" (1.816 m)    Weight 93.4 kg    Waist Circumference 43.5 inches    Hip Circumference 42 inches    Waist to Hip Ratio 1.04 %    BMI (Calculated) 28.32    Triceps Skinfold 14 mm    % Body Fat 29.4 %    Grip Strength 38 kg    Flexibility 11.5 in    Single Leg Stand 15.18 seconds             Nutrition:  Nutrition Therapy & Goals - 10/15/22 1047       Nutrition Therapy   Diet Heart Healthy Diet    Drug/Food Interactions Statins/Certain Fruits      Personal Nutrition Goals   Nutrition Goal Patient to identify strategies for reducing cardiovascular risk by attending the [employer] education and nutrition series weekly.   goal in action.   Personal Goal #2 Patient to improve diet quality by using the plate method as a guide for meal planning to include lean protein/plant protein, fruits, vegetables, whole grains, nonfat dairy as part of a Larson-balanced diet.   goal in action.   Personal Goal #3 Patient to reduce sodium intake to 1500mg  per day   goal in progress.   Comments Goals in action. Mr. Gurry continues to attend the [employer] education and nutrition series regularly. He does live at [facility] community and many of his meals are provided there; he continues to make high fiber, lean protein food choices. LDL remains above goal of <86; he continues crestor 80mg . He has maintained his weight since starting with our program. Patient will benefit from participation in intensive cardiac rehab for nutrition, exercise, and lifestyle modification.      Intervention Plan   Intervention  Prescribe, educate and counsel regarding individualized specific dietary modifications aiming towards targeted core components such as weight, hypertension, lipid management, diabetes, heart failure and other comorbidities.;Nutrition handout(s) given to patient.    Expected Outcomes Short Term Goal: Understand basic principles of dietary content, such as calories, fat, sodium, cholesterol and nutrients.;Long Term Goal: Adherence to prescribed nutrition plan.  Nutrition Discharge:  Nutrition Assessments - 11/07/22 0840       Rate Your Plate Scores   Pre Score 46    Post Score 79             Education Questionnaire Score:  Knowledge Questionnaire Score - 10/29/22 0830       Knowledge Questionnaire Score   Post Score 24/24             Goals reviewed with patient; copy given to patient.Pt graduates from  Intensive/Traditional cardiac rehab program on 11/07/22 with completion of  36 exercise and 36 education sessions. Pt maintained good attendance and progressed nicely during their participation in rehab as evidenced by increased MET level. [name] increased his distance on his post exercise walk test by 346 feet.  Medication list reconciled. Repeat  PHQ score- 0 .  Pt has made significant lifestyle changes and should be commended for his success. [name]  achieved his goals during cardiac rehab.   Pt plans to continue exercise at the gym at [facility]. Francisco Larson says that cardiac rehab has been helpful for him and he feels stronger.  We are proud of [name]'s progress! [name] RN BSN

## 2022-11-10 ENCOUNTER — Ambulatory Visit (HOSPITAL_COMMUNITY): Payer: Medicare PPO

## 2022-11-10 ENCOUNTER — Encounter (HOSPITAL_COMMUNITY): Payer: Medicare PPO

## 2022-11-11 DIAGNOSIS — S46019A Strain of muscle(s) and tendon(s) of the rotator cuff of unspecified shoulder, initial encounter: Secondary | ICD-10-CM | POA: Diagnosis not present

## 2022-11-11 DIAGNOSIS — M6281 Muscle weakness (generalized): Secondary | ICD-10-CM | POA: Diagnosis not present

## 2022-11-11 DIAGNOSIS — Z9181 History of falling: Secondary | ICD-10-CM | POA: Diagnosis not present

## 2022-11-12 ENCOUNTER — Encounter (HOSPITAL_COMMUNITY): Payer: Medicare PPO

## 2022-11-12 ENCOUNTER — Ambulatory Visit (HOSPITAL_COMMUNITY): Payer: Medicare PPO

## 2022-11-12 NOTE — Progress Notes (Signed)
Carelink Summary Report / Loop Recorder 

## 2022-11-13 DIAGNOSIS — S46019A Strain of muscle(s) and tendon(s) of the rotator cuff of unspecified shoulder, initial encounter: Secondary | ICD-10-CM | POA: Diagnosis not present

## 2022-11-13 DIAGNOSIS — Z9181 History of falling: Secondary | ICD-10-CM | POA: Diagnosis not present

## 2022-11-13 DIAGNOSIS — M6281 Muscle weakness (generalized): Secondary | ICD-10-CM | POA: Diagnosis not present

## 2022-11-14 ENCOUNTER — Encounter (HOSPITAL_COMMUNITY): Payer: Medicare PPO

## 2022-11-14 ENCOUNTER — Ambulatory Visit (HOSPITAL_COMMUNITY): Payer: Medicare PPO

## 2022-11-17 NOTE — Progress Notes (Unsigned)
Office Visit    Patient Name: Francisco Larson Date of Encounter: 11/19/2022  Primary Care Provider:  Etta Grandchild, MD Primary Cardiologist:  Charlton Haws, MD Primary Electrophysiologist: None   Past Medical History    Past Medical History:  Diagnosis Date   Arthritis    Coronary artery disease    Hyperlipidemia    Leukemia, chronic lymphocytic (HCC)    SCCA (squamous cell carcinoma) of skin 08/08/2020   Mid Frontal Scalp (in situ) (curet and 5FU)   SCCA (squamous cell carcinoma) of skin 08/08/2020   Mid Parietal Scalp (in situ) (curet and 5FU)   SCCA (squamous cell carcinoma) of skin 08/08/2020   Mid Supratip of Nose (in situ)   Shingles    Squamous cell carcinoma of skin 12/08/2018   right jaw line cis   Vitamin D deficiency    Past Surgical History:  Procedure Laterality Date   CARDIAC CATHETERIZATION     CARDIOVERSION N/A 12/16/2019   Procedure: CARDIOVERSION;  Surgeon: Thurmon Fair, MD;  [facility]: [facility];  Service: Cardiovascular;  Laterality: N/A;   CORONARY STENT INTERVENTION N/A 06/27/2022   Procedure: CORONARY STENT INTERVENTION;  Surgeon: Marykay Lex, MD;  Location: [facility] IN[facility]AS[facility] CV LAB;  Service: Cardiovascular;  Laterality: N/A;   EYE SURGERY     LAPAROSCOPIC TRANS ANAL AND TRANSABDOMINAL RECTAL RESECTION WITH COLOANAL ANASTOMOSIS     LEFT HEART CATH AND CORONARY ANGIOGRAPHY N/A 06/27/2022   Procedure: LEFT HEART CATH AND CORONARY ANGIOGRAPHY;  Surgeon: Marykay Lex, MD;  Location: [facility] INVASIVE CV LAB;  Service: Cardiovascular;  Laterality: N/A;   SURGERY OF LIP     tumor   TONSILLECTOMY     VASECTOMY      Allergies  Allergies  Allergen Reactions   Azithromycin Itching and Other (See Comments)    Pt stated he thinks this made him dizzy, also     History of Present Illness    Francisco Larson  is a 87 year old male with a PMH of PACs, CAD s/p NSTEMI with DES to mid LAD left circumflex, CLL, HLD, atrial flutter s/p  DCCV, ILR with no recurrence, moderate AAS who presents today for complaint of bradycardia and ZIO monitor results.  Francisco Larson was seen initially in 2016 for complaint of PVCs  He had a known family history of CAD and underwent calcium scoring for risk stratification that showed score of 285.  He underwent ETT that showed no evidence of ischemia or ST elevation.  He was diagnosed with atrial flutter on 11/2019 and started on Eliquis and referred to EP.  He underwent DCCV with conversion to sinus rhythm.  He had ILR placed with no recurrence and Eliquis was discontinued 12/2019.  He was last seen by me 11/2021 doing well with no new cardiac complaints.  He presented to the ED 06/26/2022 with complaint of chest pain elevations in troponin with no ischemic changes in EKG.  NSTEMI was diagnosed patient was started on heparin drip and underwent LHC that showed 80% stenosed lesion in mid LAD and 90% lesion in proximal circumflex that were treated with DES.  He was started on Plavix and aspirin and referred to cardiac rehab.  During patient's initial orientation he was found to have junctional rhythm with rate in the 40s and 50s.  Rhythm strips were reviewed by onsite provider Carlos Levering, NP and twelve-lead EKG was completed showing sinus with first-degree AVB and patient was instructed to hold Coreg. 3-day ZIO monitor  results revealed predominantly sinus rhythm with first-degree AVB present and run run of tachycardia that was not sustained and 1 pause occurring lasting 3 seconds.  No further symptomatic bradycardia off coreg. PaceArt reports ok during cardiac rehab HR 54-120 with exercise normal   No angina active done with rehab     Home Medications    Current Outpatient Medications  Medication Sig Dispense Refill   amLODipine (NORVASC) 5 MG tablet TAKE 1 TABLET (5 MG TOTAL) BY MOUTH DAILY. 90 tablet 2   aspirin EC (ASPIRIN LOW DOSE) 81 MG tablet TAKE 1 TABLET (81 MG TOTAL) BY MOUTH DAILY. SWALLOW  WHOLE. 30 tablet 10   atorvastatin (LIPITOR) 80 MG tablet TAKE 1 TABLET BY MOUTH EVERY DAY 90 tablet 3   clopidogrel (PLAVIX) 75 MG tablet TAKE 1 TABLET BY MOUTH EVERY DAY 90 tablet 3   Multiple Vitamins-Minerals (CENTRUM SILVER PO) Take 1 tablet by mouth daily with breakfast.     nitroGLYCERIN (NITROSTAT) 0.4 MG SL tablet Place 1 tablet (0.4 mg total) under the tongue every 5 (five) minutes as needed for chest pain. 25 tablet 3   TYLENOL 325 MG tablet Take 325-650 mg by mouth every 6 (six) hours as needed for mild pain or headache.     carvedilol (COREG) 3.125 MG tablet Take 1 tablet (3.125 mg total) by mouth 2 (two) times daily with a meal. (Patient not taking: Reported on 11/07/2022) 60 tablet 2   No current facility-administered medications for this visit.     Review of Systems  Please see the history of present illness.    (+) Fatigue (+) Chest discomfort  All other systems reviewed and are otherwise negative except as noted above.  Physical Exam    Wt Readings from Last 3 Encounters:  11/19/22 203 lb 12.8 oz (92.4 kg)  11/03/22 205 lb 14.6 oz (93.4 kg)  10/08/22 208 lb (94.3 kg)   VS: Vitals:   11/19/22 0823  BP: 132/72  Pulse: 60  SpO2: 98%  ,Body mass index is 28.42 kg/m.  Affect appropriate Healthy:  appears stated age HEENT: normal Neck supple with no adenopathy JVP normal no bruits no thyromegaly Lungs clear with no wheezing and good diaphragmatic motion Heart:  S1/S2 no murmur, no rub, gallop or click PMI normal Abdomen: benighn, BS positve, no tenderness, no AAA no bruit.  No HSM or HJR Distal pulses intact with no bruits No edema Neuro non-focal Skin warm and dry No muscular weakness  EKG/LABS/ Recent Cardiac Studies    ECG personally reviewed by me today -none completed today  Lab Results  Component Value Date   WBC 21.7 (H) 09/03/2022   HGB 15.5 09/03/2022   HCT 46.6 09/03/2022   MCV 92.6 09/03/2022   PLT 162 09/03/2022   Lab Results   Component Value Date   CREATININE 1.29 (H) 09/03/2022   BUN 14 09/03/2022   NA 137 09/03/2022   K 4.4 09/03/2022   CL 101 09/03/2022   CO2 27 09/03/2022   Lab Results  Component Value Date   ALT 29 09/23/2022   AST 27 09/23/2022   ALKPHOS 102 09/23/2022   BILITOT 0.6 09/23/2022   Lab Results  Component Value Date   CHOL 105 09/23/2022   HDL 27 (L) 09/23/2022   LDLCALC 61 09/23/2022   TRIG 86 09/23/2022   CHOLHDL 3.9 09/23/2022    Lab Results  Component Value Date   HGBA1C 5.7 (H) 06/26/2022     Assessment & Plan  1.  Coronary artery disease: -s/p NSTEMI 06/2022 with PCI/DES to mid LAD and left circumflex -Patient denies chest pain since previous visit. Continue Lipitor 80 mg daily, Plavix 75 mg daily, ASA 81 mg daily  2.  Symptomatic bradycardia: -3-day Zio patch showed predominantly sinus rhythm with average heart rate of 60 bpm and first-degree AVB present 1 run of VT and 5 runs of SVT. -Today patient denies any symptomatic bradycardia  Observe -Patient's Paceart report 10/27/22  was reviewed showing no malignant rhythms or pauses.  3.  Paroxysmal atrial flutter: -s/p DCCV 12/2019 with ILR placed and no recurrence 3-day ZIO monitor no recurrence -Patient's beta-blocker was discontinued due to bradycardia -Patient denies any symptomatic bradycardia or atrial fibrillation since previous visit. -Patient is scheduled to have follow-up later this month with EP  4.  Chronic lymphocytic leukemia: -Patient currently followed by oncology - WBC 21.7 stable 09/03/22   5.  Hyperlipidemia: -Patient's last LDL was 61 09/23/22 at goal  -Continue Lipitor 80 mg daily  Disposition: Follow-up in 6 months     Medication Adjustments/Labs and Tests Ordered: Current medicines are reviewed at length with the patient today.  Concerns regarding medicines are outlined above.   Signed, Charlton Haws, NP 11/19/2022, 8:29 AM [facility]

## 2022-11-18 DIAGNOSIS — Z9181 History of falling: Secondary | ICD-10-CM | POA: Diagnosis not present

## 2022-11-18 DIAGNOSIS — M6281 Muscle weakness (generalized): Secondary | ICD-10-CM | POA: Diagnosis not present

## 2022-11-18 DIAGNOSIS — S46019A Strain of muscle(s) and tendon(s) of the rotator cuff of unspecified shoulder, initial encounter: Secondary | ICD-10-CM | POA: Diagnosis not present

## 2022-11-19 ENCOUNTER — Ambulatory Visit: Payer: Medicare PPO | Attending: Cardiovascular Disease | Admitting: Cardiovascular Disease

## 2022-11-19 ENCOUNTER — Encounter: Payer: Self-pay | Admitting: Cardiovascular Disease

## 2022-11-19 VITALS — BP 132/72 | HR 60 | Ht 71.0 in | Wt 203.8 lb

## 2022-11-19 DIAGNOSIS — I2510 Atherosclerotic heart disease of native coronary artery without angina pectoris: Secondary | ICD-10-CM | POA: Diagnosis not present

## 2022-11-19 DIAGNOSIS — R001 Bradycardia, unspecified: Secondary | ICD-10-CM | POA: Diagnosis not present

## 2022-11-19 DIAGNOSIS — E782 Mixed hyperlipidemia: Secondary | ICD-10-CM

## 2022-11-19 DIAGNOSIS — I10 Essential (primary) hypertension: Secondary | ICD-10-CM | POA: Diagnosis not present

## 2022-11-19 DIAGNOSIS — C9110 Chronic lymphocytic leukemia of B-cell type not having achieved remission: Secondary | ICD-10-CM | POA: Diagnosis not present

## 2022-11-19 NOTE — Patient Instructions (Signed)
Medication Instructions:  Your physician recommends that you continue on your current medications as directed. Please refer to the Current Medication list given to you today.  *If you need a refill on your cardiac medications before your next appointment, please call your pharmacy*  Lab Work: If you have labs (blood work) drawn today and your tests are completely normal, you will receive your results only by: MyChart Message (if you have MyChart) OR A paper copy in the mail If you have any lab test that is abnormal or we need to change your treatment, we will call you to review the results.  Testing/Procedures: None ordered today.  Follow-Up: At [facility], you and your health needs are our priority.  As part of our continuing mission to provide you with exceptional heart care, we have created designated Provider Care Teams.  These Care Teams include your primary Cardiologist (physician) and Advanced Practice Providers (APPs -  Physician Assistants and Nurse Practitioners) who all work together to provide you with the care you need, when you need it.  We recommend signing up for the patient portal called "MyChart".  Sign up information is provided on this After Visit Summary.  MyChart is used to connect with patients for Virtual Visits (Telemedicine).  Patients are able to view lab/test results, encounter notes, upcoming appointments, etc.  Non-urgent messages can be sent to your provider as well.   To learn more about what you can do with MyChart, go to [email or web address].    Your next appointment:   6 month(s)  Provider:   Peter Nishan, MD      

## 2022-11-20 ENCOUNTER — Telehealth: Payer: Self-pay | Admitting: Hematology and Oncology

## 2022-11-20 DIAGNOSIS — M6281 Muscle weakness (generalized): Secondary | ICD-10-CM | POA: Diagnosis not present

## 2022-11-20 DIAGNOSIS — Z9181 History of falling: Secondary | ICD-10-CM | POA: Diagnosis not present

## 2022-11-20 DIAGNOSIS — S46019A Strain of muscle(s) and tendon(s) of the rotator cuff of unspecified shoulder, initial encounter: Secondary | ICD-10-CM | POA: Diagnosis not present

## 2022-11-24 ENCOUNTER — Inpatient Hospital Stay: Payer: Medicare PPO

## 2022-11-24 ENCOUNTER — Inpatient Hospital Stay: Payer: Medicare PPO | Admitting: Hematology and Oncology

## 2022-11-25 DIAGNOSIS — M6281 Muscle weakness (generalized): Secondary | ICD-10-CM | POA: Diagnosis not present

## 2022-11-25 DIAGNOSIS — S46019A Strain of muscle(s) and tendon(s) of the rotator cuff of unspecified shoulder, initial encounter: Secondary | ICD-10-CM | POA: Diagnosis not present

## 2022-11-25 DIAGNOSIS — Z9181 History of falling: Secondary | ICD-10-CM | POA: Diagnosis not present

## 2022-11-26 ENCOUNTER — Inpatient Hospital Stay: Payer: Medicare PPO | Admitting: Hematology and Oncology

## 2022-11-26 ENCOUNTER — Other Ambulatory Visit: Payer: Self-pay | Admitting: Hematology and Oncology

## 2022-11-26 ENCOUNTER — Inpatient Hospital Stay: Payer: Medicare PPO | Attending: Hematology and Oncology

## 2022-11-26 VITALS — BP 130/68 | HR 60 | Temp 97.3°F | Resp 13 | Wt 204.9 lb

## 2022-11-26 DIAGNOSIS — C9110 Chronic lymphocytic leukemia of B-cell type not having achieved remission: Secondary | ICD-10-CM | POA: Diagnosis not present

## 2022-11-26 DIAGNOSIS — I252 Old myocardial infarction: Secondary | ICD-10-CM | POA: Diagnosis not present

## 2022-11-26 DIAGNOSIS — Z79899 Other long term (current) drug therapy: Secondary | ICD-10-CM | POA: Insufficient documentation

## 2022-11-26 LAB — CMP (CANCER CENTER ONLY)
ALT: 24 U/L (ref 0–44)
AST: 24 U/L (ref 15–41)
Albumin: 3.7 g/dL (ref 3.5–5.0)
Alkaline Phosphatase: 92 U/L (ref 38–126)
Anion gap: 3 — ABNORMAL LOW (ref 5–15)
BUN: 16 mg/dL (ref 8–23)
CO2: 31 mmol/L (ref 22–32)
Calcium: 9.1 mg/dL (ref 8.9–10.3)
Chloride: 105 mmol/L (ref 98–111)
Creatinine: 1.08 mg/dL (ref 0.61–1.24)
GFR, Estimated: >60 mL/min (ref >60)
Glucose, Bld: 160 mg/dL — ABNORMAL HIGH (ref 70–99)
Potassium: 4.3 mmol/L (ref 3.5–5.1)
Sodium: 139 mmol/L (ref 135–145)
Total Bilirubin: 0.7 mg/dL (ref <1.2)
Total Protein: 6.2 g/dL — ABNORMAL LOW (ref 6.5–8.1)

## 2022-11-26 LAB — CBC WITH DIFFERENTIAL (CANCER CENTER ONLY)
Abs Immature Granulocytes: 0.04 10*3/uL (ref 0.00–0.07)
Basophils Absolute: 0.1 10*3/uL (ref 0.0–0.1)
Basophils Relative: 0 %
Eosinophils Absolute: 0.2 10*3/uL (ref 0.0–0.5)
Eosinophils Relative: 1 %
HCT: 47.1 % (ref 39.0–52.0)
Hemoglobin: 15.9 g/dL (ref 13.0–17.0)
Immature Granulocytes: 0 %
Lymphocytes Relative: 78 %
Lymphs Abs: 16.1 10*3/uL — ABNORMAL HIGH (ref 0.7–4.0)
MCH: 31 pg (ref 26.0–34.0)
MCHC: 33.8 g/dL (ref 30.0–36.0)
MCV: 91.8 fL (ref 80.0–100.0)
Monocytes Absolute: 0.6 10*3/uL (ref 0.1–1.0)
Monocytes Relative: 3 %
Neutro Abs: 3.6 10*3/uL (ref 1.7–7.7)
Neutrophils Relative %: 18 %
Platelet Count: 164 10*3/uL (ref 150–400)
RBC: 5.13 MIL/uL (ref 4.22–5.81)
RDW: 13.2 % (ref 11.5–15.5)
WBC Count: 20.6 10*3/uL — ABNORMAL HIGH (ref 4.0–10.5)
nRBC: 0 % (ref 0.0–0.2)

## 2022-11-26 LAB — LACTATE DEHYDROGENASE: LDH: 132 U/L (ref 98–192)

## 2022-11-26 NOTE — Progress Notes (Signed)
[facility] [phone]   [phone]  PROGRESS NOTE  Patient Care Team: Etta Grandchild, MD as PCP - General (Internal Medicine) Wendall Stade, MD as PCP - Cardiology (Cardiology) Wendall Stade, MD as Consulting Physician (Cardiology) Ovidio Kin, MD as Consulting Physician (General Surgery) Lyn Records, MD (Inactive) as Consulting Physician (Cardiology) Janalyn Harder, MD (Inactive) as Consulting Physician (Dermatology) Lanier Prude, MD as Consulting Physician (Cardiology) Jaci Standard, MD as Consulting Physician (Hematology and Oncology) Manning Charity, OD as Referring Physician (Optometry)  Hematological/Oncological History # CLL Rai Stage 0 05/05/1995: diagnosis of CLL 2001: treated with rituximab 2003: again treated with rituximab 11/25/2021: establish care with Dr. Leonides Schanz. Transfer care from Dr. Darnelle Catalan  Interval History:  Francisco Larson 87 y.o. male with medical history significant for CLL who presents for a follow up visit. The patient's last visit was on 11/25/2021. In the interim since the last visit he had a heart attack in June 2024.  On exam today Francisco Larson reports he has made a good recovery from his heart attack in June 2024.  He reports he received 2 stents for an 80 and 90% blockage.  He is currently on Plavix and aspirin.  He reports that he went through cardiac rehab and is currently in physical therapy for his shoulder due to a fall that he had in August.  He notes that he tripped in his main hallway and injured his shoulder.  He reports his appetite has been good and he is intentionally trying to lose weight at this time.  He feels like he is gaining his strength back.  He has had no recent infection such as runny nose, sore throat, cough though he does tend to have some occasional phlegm in the morning.  He denies any lymphadenopathy or lumps or bumps concerning for progression of his CLL.  He reports he  eats very little red meat, only about once per week and prefers chicken, fish, vegetables.  Overall he feels well with no questions concerns or complaints today.  He denies any fevers, chills, sweats, nausea, vomiting or diarrhea.  Full 10 point ROS was otherwise negative.  MEDICAL HISTORY:  Past Medical History:  Diagnosis Date   Arthritis    Coronary artery disease    Hyperlipidemia    Leukemia, chronic lymphocytic (HCC)    SCCA (squamous cell carcinoma) of skin 08/08/2020   Mid Frontal Scalp (in situ) (curet and 5FU)   SCCA (squamous cell carcinoma) of skin 08/08/2020   Mid Parietal Scalp (in situ) (curet and 5FU)   SCCA (squamous cell carcinoma) of skin 08/08/2020   Mid Supratip of Nose (in situ)   Shingles    Squamous cell carcinoma of skin 12/08/2018   right jaw line cis   Vitamin D deficiency     SURGICAL HISTORY: Past Surgical History:  Procedure Laterality Date   CARDIAC CATHETERIZATION     CARDIOVERSION N/A 12/16/2019   Procedure: CARDIOVERSION;  Surgeon: Thurmon Fair, MD;  Location: [facility];  Service: Cardiovascular;  Laterality: N/A;   CORONARY STENT INTERVENTION N/A 06/27/2022   Procedure: CORONARY STENT INTERVENTION;  Surgeon: Marykay Lex, MD;  Location: [facility] INVASIVE CV LAB;  Service: Cardiovascular;  Laterality: N/A;   EYE SURGERY     LAPAROSCOPIC TRANS ANAL AND TRANSABDOMINAL RECTAL RESECTION WITH COLOANAL ANASTOMOSIS     LEFT HEART CATH AND CORONARY ANGIOGRAPHY N/A 06/27/2022   Procedure: LEFT HEART CATH AND CORONARY ANGIOGRAPHY;  Surgeon: Herbie Baltimore,  Piedad Climes, MD;  Location: [facility] INVASIVE CV LAB;  Service: Cardiovascular;  Laterality: N/A;   SURGERY OF LIP     tumor   TONSILLECTOMY     VASECTOMY      SOCIAL HISTORY: Social History   Socioeconomic History   Marital status: Married    Spouse name: Not on file   Number of children: 2   Years of education: 18   Highest education level: Doctorate  Occupational History   Occupation: Retired     Comment: Professor [employer]  Tobacco Use   Smoking status: Never   Smokeless tobacco: Never  Vaping Use   Vaping status: Never Used  Substance and Sexual Activity   Alcohol use: No   Drug use: No   Sexual activity: Not on file  Other Topics Concern   Not on file  Social History Narrative   Not on file   Social Determinants of Health   Financial Resource Strain: Low Risk  (10/04/2022)   Overall Financial Resource Strain (CARDIA)    Difficulty of Paying Living Expenses: Not hard at all  Food Insecurity: No Food Insecurity (10/04/2022)   Hunger Vital Sign    Worried About Running Out of Food in the Last Year: Never true    Ran Out of Food in the Last Year: Never true  Transportation Needs: No Transportation Needs (10/04/2022)   PRAPARE - Administrator, Civil Service (Medical): No    Lack of Transportation (Non-Medical): No  Physical Activity: Sufficiently Active (10/04/2022)   Exercise Vital Sign    Days of Exercise per Week: 6 days    Minutes of Exercise per Session: 40 min  Stress: No Stress Concern Present (10/04/2022)   [employer] of Occupational Health - Occupational Stress Questionnaire    Feeling of Stress : Not at all  Social Connections: Socially Integrated (10/04/2022)   Social Connection and Isolation Panel [NHANES]    Frequency of Communication with Friends and Family: Twice a week    Frequency of Social Gatherings with Friends and Family: Three times a week    Attends Religious Services: More than 4 times per year    Active Member of Clubs or Organizations: Yes    Attends Banker Meetings: More than 4 times per year    Marital Status: Married  Catering manager Violence: Not At Risk (08/12/2022)   Humiliation, Afraid, Rape, and Kick questionnaire    Fear of Current or Ex-Partner: No    Emotionally Abused: No    Physically Abused: No    Sexually Abused: No    FAMILY HISTORY: Family History  Problem Relation Age of Onset   Colon cancer  Sister    Hyperlipidemia Sister    Stroke Sister    Heart disease Father    Emphysema Mother        never smoker, but exposed to 2nd hand from spouse   Cancer Mother    COPD Maternal Grandmother        never smoker   Stroke Maternal Grandfather    Diabetes Paternal Grandmother    Mental illness Paternal Grandmother    Asthma Sister     ALLERGIES:  is allergic to azithromycin.  MEDICATIONS:  Current Outpatient Medications  Medication Sig Dispense Refill   amLODipine (NORVASC) 5 MG tablet TAKE 1 TABLET (5 MG TOTAL) BY MOUTH DAILY. 90 tablet 2   aspirin EC (ASPIRIN LOW DOSE) 81 MG tablet TAKE 1 TABLET (81 MG TOTAL) BY MOUTH DAILY. SWALLOW WHOLE.  30 tablet 10   atorvastatin (LIPITOR) 80 MG tablet TAKE 1 TABLET BY MOUTH EVERY DAY 90 tablet 3   carvedilol (COREG) 3.125 MG tablet Take 1 tablet (3.125 mg total) by mouth 2 (two) times daily with a meal. (Patient not taking: Reported on 11/07/2022) 60 tablet 2   clopidogrel (PLAVIX) 75 MG tablet TAKE 1 TABLET BY MOUTH EVERY DAY 90 tablet 3   Multiple Vitamins-Minerals (CENTRUM SILVER PO) Take 1 tablet by mouth daily with breakfast.     nitroGLYCERIN (NITROSTAT) 0.4 MG SL tablet Place 1 tablet (0.4 mg total) under the tongue every 5 (five) minutes as needed for chest pain. 25 tablet 3   TYLENOL 325 MG tablet Take 325-650 mg by mouth every 6 (six) hours as needed for mild pain or headache.     No current facility-administered medications for this visit.    REVIEW OF SYSTEMS:   Constitutional: ( - ) fevers, ( - )  chills , ( - ) night sweats Eyes: ( - ) blurriness of vision, ( - ) double vision, ( - ) watery eyes Ears, nose, mouth, throat, and face: ( - ) mucositis, ( - ) sore throat Respiratory: ( - ) cough, ( - ) dyspnea, ( - ) wheezes Cardiovascular: ( - ) palpitation, ( - ) chest discomfort, ( - ) lower extremity swelling Gastrointestinal:  ( - ) nausea, ( - ) heartburn, ( - ) change in bowel habits Skin: ( - ) abnormal skin  rashes Lymphatics: ( - ) new lymphadenopathy, ( - ) easy bruising Neurological: ( - ) numbness, ( - ) tingling, ( - ) new weaknesses Behavioral/Psych: ( - ) mood change, ( - ) new changes  All other systems were reviewed with the patient and are negative.  PHYSICAL EXAMINATION: ECOG PERFORMANCE STATUS: 0 - Asymptomatic  Vitals:   11/26/22 0904  BP: 130/68  Pulse: 60  Resp: 13  Temp: (!) 97.3 F (36.3 C)  SpO2: 99%    Filed Weights   11/26/22 0904  Weight: 204 lb 14.4 oz (92.9 kg)     GENERAL: Well-appearing elderly Caucasian male, appears younger than stated age.  Alert, no distress and comfortable SKIN: skin color, texture, turgor are normal, no rashes or significant lesions EYES: conjunctiva are pink and non-injected, sclera clear NECK: supple, non-tender LYMPH:  no palpable lymphadenopathy in the cervical, axillary or inguinal LUNGS: clear to auscultation and percussion with normal breathing effort HEART: regular rate & rhythm and no murmurs and no lower extremity edema Musculoskeletal: no cyanosis of digits and no clubbing  PSYCH: alert & oriented x 3, fluent speech NEURO: no focal motor/sensory deficits  LABORATORY DATA:  I have reviewed the data as listed    Latest Ref Rng & Units 11/26/2022    8:39 AM 09/03/2022   10:19 AM 06/28/2022   12:16 AM  CBC  WBC 4.0 - 10.5 K/uL 20.6  21.7  20.9   Hemoglobin 13.0 - 17.0 g/dL 13.0  86.5  78.4   Hematocrit 39.0 - 52.0 % 47.1  46.6  43.3   Platelets 150 - 400 K/uL 164  162  156        Latest Ref Rng & Units 11/26/2022    8:39 AM 09/23/2022    8:57 AM 09/03/2022   10:19 AM  CMP  Glucose 70 - 99 mg/dL 696   96   BUN 8 - 23 mg/dL 16   14   Creatinine 2.95 - 1.24 mg/dL 2.84  1.29   Sodium 135 - 145 mmol/L 139   137   Potassium 3.5 - 5.1 mmol/L 4.3   4.4   Chloride 98 - 111 mmol/L 105   101   CO2 22 - 32 mmol/L 31   27   Calcium 8.9 - 10.3 mg/dL 9.1   9.1   Total Protein 6.5 - 8.1 g/dL 6.2  5.8  5.9   Total  Bilirubin <1.2 mg/dL 0.7  0.6  0.7   Alkaline Phos 38 - 126 U/L 92  102  85   AST 15 - 41 U/L 24  27  28    ALT 0 - 44 U/L 24  29  28      No results found for: "MPROTEIN" Lab Results  Component Value Date   KPAFRELGTCHN 0.96 10/30/2009   KPAFRELGTCHN 0.88 02/27/2009   LAMBDASER 1.16 10/30/2009   LAMBDASER 0.88 02/27/2009   KAPLAMBRATIO 0.83 10/30/2009   KAPLAMBRATIO 1.00 02/27/2009     RADIOGRAPHIC STUDIES: No results found.  ASSESSMENT & PLAN NATHIAN CALAHAN 87 y.o. male with medical history significant for CLL who presents for a follow up visit.  # CLL Rai Stage 0 --labs today show white blood cell 20.6, hemoglobin 15.9, MCV 91.8, platelets 164 --Patient has twice been treated with rituximab, once in 2001 and again in 2003 --No clear indications for treatment at this time.  Indications for treatment as noted below. --RTC in 12 month time or sooner if he were to develop new or worsening symptoms.  No orders of the defined types were placed in this encounter.   All questions were answered. The patient knows to call the clinic with any problems, questions or concerns.  A total of more than 30 minutes were spent on this encounter with face-to-face time and non-face-to-face time, including preparing to see the patient, ordering tests and/or medications, counseling the patient and coordination of care as outlined above.   Ulysees Barns, MD Department of Hematology/Oncology [facility] Phone: [phone] Pager: [phone] Email: [email or web address]  11/26/2022 10:06 AM  Anders Grant Sharlot Gowda BD, Catovsky D, Caligaris-Cappio F, Dighiero G, Dhner H, [place], [place], [place] E, Chiorazzi N, Seltzer, Rai KR, [place], Eichhorst B, O'Brien S, Robak T, Seymour JF, Kipps TJ. iwCLL guidelines for diagnosis, indications for treatment, response assessment, and supportive management of CLL. Blood. 2018 Jun  21;131(25):2745-2760.  Active disease should be clearly documented to initiate therapy. At least 1 of the following criteria should be met.  1) Evidence of progressive marrow failure as manifested by the development of, or worsening of, anemia and/or thrombocytopenia. Cutoff levels of Hb <10 g/dL or platelet counts <329  109/L are generally regarded as indication for treatment. However, in some patients, platelet counts <100  109/L may remain stable over a long period; this situation does not automatically require therapeutic intervention. 2) Massive (ie, >=6 cm below the left costal margin) or progressive or symptomatic splenomegaly. 3) Massive nodes (ie, >=10 cm in longest diameter) or progressive or symptomatic lymphadenopathy. 4) Progressive lymphocytosis with an increase of >=50% over a 71-month period, or lymphocyte doubling time (LDT) <6 months. LDT can be obtained by linear regression extrapolation of absolute lymphocyte counts obtained at intervals of 2 weeks over an observation period of 2 to 3 months; patients with initial blood lymphocyte counts <30  109/L may require a longer observation period to determine the LDT. Factors contributing to lymphocytosis other than CLL (eg, infections, steroid administration) should  be excluded. 5) Autoimmune complications including anemia or thrombocytopenia poorly responsive to corticosteroids. 6) Symptomatic or functional extranodal involvement (eg, skin, kidney, lung, spine). Disease-related symptoms as defined by any of the following: Unintentional weight loss >=10% within the previous 6 months. Significant fatigue (ie, ECOG performance scale 2 or worse; cannot work or unable to perform usual activities). Fevers >=100.69F or 38.0C for 2 or more weeks without evidence of infection. Night sweats for >=1 month without evidence of infection.

## 2022-11-27 DIAGNOSIS — S46019A Strain of muscle(s) and tendon(s) of the rotator cuff of unspecified shoulder, initial encounter: Secondary | ICD-10-CM | POA: Diagnosis not present

## 2022-11-27 DIAGNOSIS — Z9181 History of falling: Secondary | ICD-10-CM | POA: Diagnosis not present

## 2022-11-27 DIAGNOSIS — M6281 Muscle weakness (generalized): Secondary | ICD-10-CM | POA: Diagnosis not present

## 2022-12-01 ENCOUNTER — Ambulatory Visit (INDEPENDENT_AMBULATORY_CARE_PROVIDER_SITE_OTHER): Payer: Medicare PPO

## 2022-12-01 DIAGNOSIS — I4819 Other persistent atrial fibrillation: Secondary | ICD-10-CM | POA: Diagnosis not present

## 2022-12-01 LAB — CUP PACEART REMOTE DEVICE CHECK
Date Time Interrogation Session: 20241117230704
Implantable Pulse Generator Implant Date: 20211221

## 2022-12-02 DIAGNOSIS — Z9181 History of falling: Secondary | ICD-10-CM | POA: Diagnosis not present

## 2022-12-02 DIAGNOSIS — S46019A Strain of muscle(s) and tendon(s) of the rotator cuff of unspecified shoulder, initial encounter: Secondary | ICD-10-CM | POA: Diagnosis not present

## 2022-12-02 DIAGNOSIS — M6281 Muscle weakness (generalized): Secondary | ICD-10-CM | POA: Diagnosis not present

## 2022-12-04 DIAGNOSIS — M6281 Muscle weakness (generalized): Secondary | ICD-10-CM | POA: Diagnosis not present

## 2022-12-04 DIAGNOSIS — S46019A Strain of muscle(s) and tendon(s) of the rotator cuff of unspecified shoulder, initial encounter: Secondary | ICD-10-CM | POA: Diagnosis not present

## 2022-12-04 DIAGNOSIS — Z9181 History of falling: Secondary | ICD-10-CM | POA: Diagnosis not present

## 2022-12-05 DIAGNOSIS — Z85828 Personal history of other malignant neoplasm of skin: Secondary | ICD-10-CM | POA: Diagnosis not present

## 2022-12-05 DIAGNOSIS — L570 Actinic keratosis: Secondary | ICD-10-CM | POA: Diagnosis not present

## 2022-12-05 DIAGNOSIS — L821 Other seborrheic keratosis: Secondary | ICD-10-CM | POA: Diagnosis not present

## 2022-12-05 DIAGNOSIS — L820 Inflamed seborrheic keratosis: Secondary | ICD-10-CM | POA: Diagnosis not present

## 2022-12-08 DIAGNOSIS — Z9181 History of falling: Secondary | ICD-10-CM | POA: Diagnosis not present

## 2022-12-08 DIAGNOSIS — S46019A Strain of muscle(s) and tendon(s) of the rotator cuff of unspecified shoulder, initial encounter: Secondary | ICD-10-CM | POA: Diagnosis not present

## 2022-12-08 DIAGNOSIS — M6281 Muscle weakness (generalized): Secondary | ICD-10-CM | POA: Diagnosis not present

## 2022-12-10 DIAGNOSIS — M6281 Muscle weakness (generalized): Secondary | ICD-10-CM | POA: Diagnosis not present

## 2022-12-10 DIAGNOSIS — S46019A Strain of muscle(s) and tendon(s) of the rotator cuff of unspecified shoulder, initial encounter: Secondary | ICD-10-CM | POA: Diagnosis not present

## 2022-12-10 DIAGNOSIS — Z9181 History of falling: Secondary | ICD-10-CM | POA: Diagnosis not present

## 2022-12-16 DIAGNOSIS — Z9181 History of falling: Secondary | ICD-10-CM | POA: Diagnosis not present

## 2022-12-16 DIAGNOSIS — M6281 Muscle weakness (generalized): Secondary | ICD-10-CM | POA: Diagnosis not present

## 2022-12-16 DIAGNOSIS — S46019A Strain of muscle(s) and tendon(s) of the rotator cuff of unspecified shoulder, initial encounter: Secondary | ICD-10-CM | POA: Diagnosis not present

## 2022-12-18 DIAGNOSIS — Z9181 History of falling: Secondary | ICD-10-CM | POA: Diagnosis not present

## 2022-12-18 DIAGNOSIS — M6281 Muscle weakness (generalized): Secondary | ICD-10-CM | POA: Diagnosis not present

## 2022-12-18 DIAGNOSIS — S46019A Strain of muscle(s) and tendon(s) of the rotator cuff of unspecified shoulder, initial encounter: Secondary | ICD-10-CM | POA: Diagnosis not present

## 2022-12-22 DIAGNOSIS — M6281 Muscle weakness (generalized): Secondary | ICD-10-CM | POA: Diagnosis not present

## 2022-12-22 DIAGNOSIS — Z9181 History of falling: Secondary | ICD-10-CM | POA: Diagnosis not present

## 2022-12-22 DIAGNOSIS — S46019A Strain of muscle(s) and tendon(s) of the rotator cuff of unspecified shoulder, initial encounter: Secondary | ICD-10-CM | POA: Diagnosis not present

## 2022-12-24 DIAGNOSIS — M6281 Muscle weakness (generalized): Secondary | ICD-10-CM | POA: Diagnosis not present

## 2022-12-24 DIAGNOSIS — Z9181 History of falling: Secondary | ICD-10-CM | POA: Diagnosis not present

## 2022-12-24 DIAGNOSIS — S46019A Strain of muscle(s) and tendon(s) of the rotator cuff of unspecified shoulder, initial encounter: Secondary | ICD-10-CM | POA: Diagnosis not present

## 2022-12-26 NOTE — Progress Notes (Signed)
Carelink Summary Report / Loop Recorder 

## 2022-12-30 DIAGNOSIS — H31002 Unspecified chorioretinal scars, left eye: Secondary | ICD-10-CM | POA: Diagnosis not present

## 2022-12-30 DIAGNOSIS — H5203 Hypermetropia, bilateral: Secondary | ICD-10-CM | POA: Diagnosis not present

## 2022-12-30 DIAGNOSIS — H52203 Unspecified astigmatism, bilateral: Secondary | ICD-10-CM | POA: Diagnosis not present

## 2023-01-05 ENCOUNTER — Ambulatory Visit (INDEPENDENT_AMBULATORY_CARE_PROVIDER_SITE_OTHER): Payer: Medicare PPO

## 2023-01-05 ENCOUNTER — Encounter: Payer: Self-pay | Admitting: Cardiovascular Disease

## 2023-01-05 DIAGNOSIS — I480 Paroxysmal atrial fibrillation: Secondary | ICD-10-CM

## 2023-01-05 DIAGNOSIS — I4892 Unspecified atrial flutter: Secondary | ICD-10-CM

## 2023-01-06 LAB — CUP PACEART REMOTE DEVICE CHECK
Date Time Interrogation Session: 20241222230644
Implantable Pulse Generator Implant Date: 20211221

## 2023-01-09 NOTE — Telephone Encounter (Signed)
No recent episodes recorded.  Pt is programmed to record Afib that lasts longer than 6 minutes.  His Atrial tachycardia monitor is programmed off.  This may have been an atrial tachycardia episode per review of his histograms.

## 2023-01-21 ENCOUNTER — Ambulatory Visit (HOSPITAL_COMMUNITY)
Admission: RE | Admit: 2023-01-21 | Discharge: 2023-01-21 | Disposition: A | Discharge status: Home / Self Care | Payer: Medicare PPO | Source: Ambulatory Visit | Attending: Cardiovascular Disease | Admitting: Cardiovascular Disease

## 2023-01-21 DIAGNOSIS — I779 Disorder of arteries and arterioles, unspecified: Secondary | ICD-10-CM | POA: Insufficient documentation

## 2023-02-02 ENCOUNTER — Telehealth: Payer: Self-pay | Admitting: Cardiovascular Disease

## 2023-02-02 DIAGNOSIS — I779 Disorder of arteries and arterioles, unspecified: Secondary | ICD-10-CM

## 2023-02-02 NOTE — Telephone Encounter (Signed)
-----   Message from Charlton Haws sent at 01/21/2023  4:19 PM EST ----- Plaque no stenosis f/u carotid duplex in 2 years

## 2023-02-02 NOTE — Telephone Encounter (Signed)
Returning call to nurse °

## 2023-02-02 NOTE — Telephone Encounter (Signed)
Called patient with his results and made him a follow-up appointment with Dr. Eden Emms.

## 2023-02-03 ENCOUNTER — Ambulatory Visit: Payer: Medicare PPO | Admitting: Podiatry

## 2023-02-03 ENCOUNTER — Encounter: Payer: Self-pay | Admitting: Podiatry

## 2023-02-03 DIAGNOSIS — M79675 Pain in left toe(s): Secondary | ICD-10-CM

## 2023-02-03 DIAGNOSIS — B351 Tinea unguium: Secondary | ICD-10-CM

## 2023-02-03 DIAGNOSIS — M79674 Pain in right toe(s): Secondary | ICD-10-CM | POA: Diagnosis not present

## 2023-02-08 ENCOUNTER — Encounter: Payer: Self-pay | Admitting: Podiatry

## 2023-02-08 NOTE — Progress Notes (Signed)
  Subjective:  Patient ID: Francisco Larson, male    DOB: [birth year],  MRN: [id number]  88 y.o. male presents painful mycotic toenails of both feet that are difficult to trim. Pain interferes with daily activities and wearing enclosed shoe gear comfortably.  Patient states he has had an MI since his last visit with me. He has had two coronary stents placed. Chief Complaint  Patient presents with   Nail Problem    Patient states he last saw his PCP about 4 or 5 months ago , Patient states his PCP name is Sanda Linger    New problem(s): None   PCP is Etta Grandchild, MD , and last visit was October 08, 2022.  Allergies  Allergen Reactions   Azithromycin Itching and Other (See Comments)    Pt stated he thinks this made him dizzy, also   Review of Systems: Negative except as noted in the HPI.   Objective:  Francisco Larson is a pleasant 88 y.o. male WD, WN in NAD. AAO x 3.  Vascular Examination: Vascular status intact b/l with palpable pedal pulses. CFT immediate b/l. Pedal hair present. No edema. No pain with calf compression b/l. Skin temperature gradient WNL b/l. No varicosities noted. No cyanosis or clubbing noted.  Neurological Examination: Sensation grossly intact b/l with 10 gram monofilament. Vibratory sensation intact b/l.  Dermatological Examination: Pedal skin with normal turgor, texture and tone b/l. No open wounds nor interdigital macerations noted. Toenails 1-5 b/l thick, discolored, elongated with subungual debris and pain on dorsal palpation. No hyperkeratotic lesions noted b/l.   Musculoskeletal Examination: Muscle strength 5/5 to b/l LE.  No pain, crepitus noted b/l. No gross pedal deformities. Patient ambulates independently without assistive aids.   Radiographs: None  Last A1c:      Latest Ref Rng & Units 06/26/2022    6:45 PM 04/09/2022   10:30 AM  Hemoglobin A1C  Hemoglobin-A1c 4.8 - 5.6 % 5.7  6.1      Assessment:   1. Pain due to  onychomycosis of toenails of both feet    Plan:  -Consent given for treatment as described below: -Examined patient. -Continue supportive shoe gear daily. -Mycotic toenails 1-5 bilaterally were debrided in length and girth with sterile nail nippers and dremel without incident. -Patient/POA to call should there be question/concern in the interim.  Return in about 3 months (around 05/04/2023).  Francisco Larson, DPM      [facility] LOCATION: [address], [place] [address]                   Office [phone]   [facility] LOCATION: [address] [place], [place] [address] Office [phone]

## 2023-02-09 ENCOUNTER — Ambulatory Visit: Payer: Medicare PPO

## 2023-02-09 DIAGNOSIS — I4892 Unspecified atrial flutter: Secondary | ICD-10-CM

## 2023-02-09 LAB — CUP PACEART REMOTE DEVICE CHECK
Date Time Interrogation Session: 20250126230555
Implantable Pulse Generator Implant Date: 20211221

## 2023-02-10 ENCOUNTER — Encounter: Payer: Self-pay | Admitting: Cardiology

## 2023-02-16 NOTE — Progress Notes (Signed)
 [facility] Summary Report / Loop Recorder

## 2023-02-16 NOTE — Addendum Note (Signed)
Addended by: Geralyn Flash D on: 02/16/2023 10:42 AM   Modules accepted: Orders

## 2023-03-16 ENCOUNTER — Ambulatory Visit (INDEPENDENT_AMBULATORY_CARE_PROVIDER_SITE_OTHER): Payer: Medicare PPO

## 2023-03-16 DIAGNOSIS — I4892 Unspecified atrial flutter: Secondary | ICD-10-CM | POA: Diagnosis not present

## 2023-03-16 LAB — CUP PACEART REMOTE DEVICE CHECK
Date Time Interrogation Session: 20250302230414
Implantable Pulse Generator Implant Date: 20211221

## 2023-03-17 ENCOUNTER — Encounter: Payer: Self-pay | Admitting: Cardiology

## 2023-03-23 NOTE — Progress Notes (Signed)
 [facility] Summary Report / Loop Recorder

## 2023-04-15 DIAGNOSIS — L814 Other melanin hyperpigmentation: Secondary | ICD-10-CM | POA: Diagnosis not present

## 2023-04-15 DIAGNOSIS — L821 Other seborrheic keratosis: Secondary | ICD-10-CM | POA: Diagnosis not present

## 2023-04-15 DIAGNOSIS — D692 Other nonthrombocytopenic purpura: Secondary | ICD-10-CM | POA: Diagnosis not present

## 2023-04-15 DIAGNOSIS — D1801 Hemangioma of skin and subcutaneous tissue: Secondary | ICD-10-CM | POA: Diagnosis not present

## 2023-04-15 DIAGNOSIS — L218 Other seborrheic dermatitis: Secondary | ICD-10-CM | POA: Diagnosis not present

## 2023-04-15 DIAGNOSIS — Z85828 Personal history of other malignant neoplasm of skin: Secondary | ICD-10-CM | POA: Diagnosis not present

## 2023-04-15 DIAGNOSIS — L570 Actinic keratosis: Secondary | ICD-10-CM | POA: Diagnosis not present

## 2023-04-20 ENCOUNTER — Ambulatory Visit (INDEPENDENT_AMBULATORY_CARE_PROVIDER_SITE_OTHER): Payer: Medicare PPO

## 2023-04-20 DIAGNOSIS — I4892 Unspecified atrial flutter: Secondary | ICD-10-CM | POA: Diagnosis not present

## 2023-04-20 NOTE — Progress Notes (Signed)
 [facility] Summary Report / Loop Recorder

## 2023-04-21 LAB — CUP PACEART REMOTE DEVICE CHECK
Date Time Interrogation Session: 20250406230316
Implantable Pulse Generator Implant Date: 20211221

## 2023-04-22 ENCOUNTER — Ambulatory Visit: Admitting: Internal Medicine

## 2023-04-22 ENCOUNTER — Telehealth: Payer: Self-pay

## 2023-04-22 ENCOUNTER — Encounter: Payer: Self-pay | Admitting: Internal Medicine

## 2023-04-22 VITALS — BP 128/78 | HR 69 | Temp 97.1°F | Resp 16 | Ht 71.0 in | Wt 201.8 lb

## 2023-04-22 DIAGNOSIS — N1831 Chronic kidney disease, stage 3a: Secondary | ICD-10-CM | POA: Diagnosis not present

## 2023-04-22 DIAGNOSIS — R7989 Other specified abnormal findings of blood chemistry: Secondary | ICD-10-CM

## 2023-04-22 DIAGNOSIS — I4892 Unspecified atrial flutter: Secondary | ICD-10-CM | POA: Diagnosis not present

## 2023-04-22 DIAGNOSIS — I10 Essential (primary) hypertension: Secondary | ICD-10-CM | POA: Diagnosis not present

## 2023-04-22 DIAGNOSIS — R7303 Prediabetes: Secondary | ICD-10-CM | POA: Diagnosis not present

## 2023-04-22 DIAGNOSIS — C9110 Chronic lymphocytic leukemia of B-cell type not having achieved remission: Secondary | ICD-10-CM

## 2023-04-22 DIAGNOSIS — Z0000 Encounter for general adult medical examination without abnormal findings: Secondary | ICD-10-CM

## 2023-04-22 DIAGNOSIS — Z0001 Encounter for general adult medical examination with abnormal findings: Secondary | ICD-10-CM | POA: Insufficient documentation

## 2023-04-22 DIAGNOSIS — I480 Paroxysmal atrial fibrillation: Secondary | ICD-10-CM

## 2023-04-22 LAB — URINALYSIS, ROUTINE W REFLEX MICROSCOPIC
Bilirubin Urine: NEGATIVE
Hgb urine dipstick: NEGATIVE
Ketones, ur: NEGATIVE
Leukocytes,Ua: NEGATIVE
Nitrite: NEGATIVE
RBC / HPF: NONE SEEN (ref 0–?)
Specific Gravity, Urine: 1.02 (ref 1.000–1.030)
Total Protein, Urine: NEGATIVE
Urine Glucose: NEGATIVE
Urobilinogen, UA: 0.2 (ref 0.0–1.0)
pH: 6 (ref 5.0–8.0)

## 2023-04-22 LAB — CBC WITH DIFFERENTIAL/PLATELET
Basophils Absolute: 0.1 10*3/uL (ref 0.0–0.1)
Basophils Relative: 0.3 % (ref 0.0–3.0)
Eosinophils Absolute: 0.2 10*3/uL (ref 0.0–0.7)
Eosinophils Relative: 1 % (ref 0.0–5.0)
HCT: 49.2 % (ref 39.0–52.0)
Hemoglobin: 16.4 g/dL (ref 13.0–17.0)
Lymphocytes Relative: 73.5 % — ABNORMAL HIGH (ref 12.0–46.0)
Lymphs Abs: 15 10*3/uL — ABNORMAL HIGH (ref 0.7–4.0)
MCHC: 33.3 g/dL (ref 30.0–36.0)
MCV: 91.7 fl (ref 78.0–100.0)
Monocytes Absolute: 0.7 10*3/uL (ref 0.1–1.0)
Monocytes Relative: 3.6 % (ref 3.0–12.0)
Neutro Abs: 4.4 10*3/uL (ref 1.4–7.7)
Neutrophils Relative %: 21.6 % — ABNORMAL LOW (ref 43.0–77.0)
Platelets: 160 10*3/uL (ref 150.0–400.0)
RBC: 5.36 Mil/uL (ref 4.22–5.81)
RDW: 14.2 % (ref 11.5–15.5)
WBC: 20.5 10*3/uL (ref 4.0–10.5)

## 2023-04-22 LAB — BASIC METABOLIC PANEL WITH GFR
BUN: 17 mg/dL (ref 6–23)
CO2: 29 meq/L (ref 19–32)
Calcium: 9.1 mg/dL (ref 8.4–10.5)
Chloride: 102 meq/L (ref 96–112)
Creatinine, Ser: 1.15 mg/dL (ref 0.40–1.50)
GFR: 57.94 mL/min — ABNORMAL LOW (ref >60.00)
Glucose, Bld: 106 mg/dL — ABNORMAL HIGH (ref 70–99)
Potassium: 4.4 meq/L (ref 3.5–5.1)
Sodium: 136 meq/L (ref 135–145)

## 2023-04-22 LAB — HEMOGLOBIN A1C: Hgb A1c MFr Bld: 6 % (ref 4.6–6.5)

## 2023-04-22 NOTE — Patient Instructions (Signed)
 Health Maintenance, Male  Adopting a healthy lifestyle and getting preventive care are important in promoting health and wellness. Ask your health care provider about:  The right schedule for you to have regular tests and exams.  Things you can do on your own to prevent diseases and keep yourself healthy.  What should I know about diet, weight, and exercise?  Eat a healthy diet    Eat a diet that includes plenty of vegetables, fruits, low-fat dairy products, and lean protein.  Do not eat a lot of foods that are high in solid fats, added sugars, or sodium.  Maintain a healthy weight  Body mass index (BMI) is a measurement that can be used to identify possible weight problems. It estimates body fat based on height and weight. Your health care provider can help determine your BMI and help you achieve or maintain a healthy weight.  Get regular exercise  Get regular exercise. This is one of the most important things you can do for your health. Most adults should:  Exercise for at least 150 minutes each week. The exercise should increase your heart rate and make you sweat (moderate-intensity exercise).  Do strengthening exercises at least twice a week. This is in addition to the moderate-intensity exercise.  Spend less time sitting. Even light physical activity can be beneficial.  Watch cholesterol and blood lipids  Have your blood tested for lipids and cholesterol at 86 years of age, then have this test every 5 years.  You may need to have your cholesterol levels checked more often if:  Your lipid or cholesterol levels are high.  You are older than 86 years of age.  You are at high risk for heart disease.  What should I know about cancer screening?  Many types of cancers can be detected early and may often be prevented. Depending on your health history and family history, you may need to have cancer screening at various ages. This may include screening for:  Colorectal cancer.  Prostate cancer.  Skin cancer.  Lung  cancer.  What should I know about heart disease, diabetes, and high blood pressure?  Blood pressure and heart disease  High blood pressure causes heart disease and increases the risk of stroke. This is more likely to develop in people who have high blood pressure readings or are overweight.  Talk with your health care provider about your target blood pressure readings.  Have your blood pressure checked:  Every 3-5 years if you are 9-95 years of age.  Every year if you are 85 years old or older.  If you are between the ages of 29 and 29 and are a current or former smoker, ask your health care provider if you should have a one-time screening for abdominal aortic aneurysm (AAA).  Diabetes  Have regular diabetes screenings. This checks your fasting blood sugar level. Have the screening done:  Once every three years after age 23 if you are at a normal weight and have a low risk for diabetes.  More often and at a younger age if you are overweight or have a high risk for diabetes.  What should I know about preventing infection?  Hepatitis B  If you have a higher risk for hepatitis B, you should be screened for this virus. Talk with your health care provider to find out if you are at risk for hepatitis B infection.  Hepatitis C  Blood testing is recommended for:  Everyone born from [birth year] through 1963.  Anyone  with known risk factors for hepatitis C.  Sexually transmitted infections (STIs)  You should be screened each year for STIs, including gonorrhea and chlamydia, if:  You are sexually active and are younger than 86 years of age.  You are older than 86 years of age and your health care provider tells you that you are at risk for this type of infection.  Your sexual activity has changed since you were last screened, and you are at increased risk for chlamydia or gonorrhea. Ask your health care provider if you are at risk.  Ask your health care provider about whether you are at high risk for HIV. Your health care provider  may recommend a prescription medicine to help prevent HIV infection. If you choose to take medicine to prevent HIV, you should first get tested for HIV. You should then be tested every 3 months for as long as you are taking the medicine.  Follow these instructions at home:  Alcohol use  Do not drink alcohol if your health care provider tells you not to drink.  If you drink alcohol:  Limit how much you have to 0-2 drinks a day.  Know how much alcohol is in your drink. In the [place] one drink equals one 12 oz bottle of beer (355 mL), one 5 oz glass of wine (148 mL), or one 1 oz glass of hard liquor (44 mL).  Lifestyle  Do not use any products that contain nicotine or tobacco. These products include cigarettes, chewing tobacco, and vaping devices, such as e-cigarettes. If you need help quitting, ask your health care provider.  Do not use street drugs.  Do not share needles.  Ask your health care provider for help if you need support or information about quitting drugs.  General instructions  Schedule regular health, dental, and eye exams.  Stay current with your vaccines.  Tell your health care provider if:  You often feel depressed.  You have ever been abused or do not feel safe at home.  Summary  Adopting a healthy lifestyle and getting preventive care are important in promoting health and wellness.  Follow your health care provider's instructions about healthy diet, exercising, and getting tested or screened for diseases.  Follow your health care provider's instructions on monitoring your cholesterol and blood pressure.  This information is not intended to replace advice given to you by your health care provider. Make sure you discuss any questions you have with your health care provider.  Document Revised: 05/21/2020 Document Reviewed: 05/21/2020  Elsevier Patient Education  2024 [employer].

## 2023-04-22 NOTE — Progress Notes (Signed)
 Subjective:  Patient ID: Francisco Larson, male    DOB: Apr 25, 1935  Age: 88 y.o. MRN: [id number]  CC: Annual Exam and Atrial Fibrillation   HPI [name] presents for a CPX and f/up ---  Discussed the use of AI scribe software for clinical note transcription with the patient, who gave verbal consent to proceed.  History of Present Illness   Francisco Larson is an 88 year old male with a history of heart attack who presents with increased fatigue during workouts.  He has experienced increased fatigue during workouts over the past couple of weeks. He exercises daily but notes that his current workout intensity is lower than before his heart attack. Fatigue is inconsistent, with some workouts leaving him feeling fine and others, particularly cardio sessions, causing more tiredness. He felt extremely tired after a 30-minute session.  He experienced a brief episode of dizziness the previous day after standing up quickly during a singing group rehearsal, which resolved after 30 seconds. No regular dizziness or lightheadedness is reported.  He recalls a shoulder injury from a fall in August, for which he underwent x-rays and was referred to an orthopedist. He completed 21-22 sessions of physical therapy, resulting in significant improvement, with his shoulder now at 90-98% functionality. He avoids activities that exacerbate pain, such as reaching straight back, but can perform tasks like reaching to the backseat of a car, which he couldn't do before therapy. He is not taking any pain medication for his shoulder.  Three months ago, he noticed painless hematuria but reports no other bleeding. He mentions that his skin is thin and he is on blood thinners for his heart, which requires him to be cautious to avoid cuts that bleed easily. He has not experienced any swelling in his legs or feet.  He monitors his blood pressure at home, with recent readings around 123/60 to 126/60. He  has lost about 10 pounds and is working on losing more.       Outpatient Medications Prior to Visit  Medication Sig Dispense Refill   amLODipine (NORVASC) 5 MG tablet TAKE 1 TABLET (5 MG TOTAL) BY MOUTH DAILY. 90 tablet 2   aspirin EC (ASPIRIN LOW DOSE) 81 MG tablet TAKE 1 TABLET (81 MG TOTAL) BY MOUTH DAILY. SWALLOW WHOLE. 30 tablet 10   atorvastatin (LIPITOR) 80 MG tablet TAKE 1 TABLET BY MOUTH EVERY DAY 90 tablet 3   clopidogrel (PLAVIX) 75 MG tablet TAKE 1 TABLET BY MOUTH EVERY DAY 90 tablet 3   Multiple Vitamins-Minerals (CENTRUM SILVER PO) Take 1 tablet by mouth daily with breakfast.     nitroGLYCERIN (NITROSTAT) 0.4 MG SL tablet Place 1 tablet (0.4 mg total) under the tongue every 5 (five) minutes as needed for chest pain. 25 tablet 3   TYLENOL 325 MG tablet Take 325-650 mg by mouth every 6 (six) hours as needed for mild pain or headache.     carvedilol (COREG) 3.125 MG tablet Take 1 tablet (3.125 mg total) by mouth 2 (two) times daily with a meal. (Patient not taking: Reported on 11/07/2022) 60 tablet 2   No facility-administered medications prior to visit.    ROS Review of Systems  Constitutional:  Positive for fatigue. Negative for appetite change, chills, diaphoresis, fever and unexpected weight change.  HENT: Negative.    Eyes: Negative.   Respiratory: Negative.  Negative for cough, chest tightness, shortness of breath and wheezing.   Cardiovascular:  Negative for chest pain, palpitations and leg swelling.  Gastrointestinal: Negative.  Negative for abdominal pain, constipation, diarrhea, nausea and vomiting.  Genitourinary:  Positive for hematuria. Negative for difficulty urinating, scrotal swelling and testicular pain.  Musculoskeletal: Negative.   Skin: Negative.   Neurological:  Positive for dizziness. Negative for weakness and light-headedness.  Hematological:  Negative for adenopathy. Does not bruise/bleed easily.  Psychiatric/Behavioral: Negative.      Objective:   BP 128/78 (BP Location: Left Arm, Patient Position: Sitting, Cuff Size: Normal)   Pulse 69   Temp (!) 97.1 F (36.2 C) (Oral)   Resp 16   Ht 5\' 11"  (1.803 m)   Wt 201 lb 12.8 oz (91.5 kg)   SpO2 99%   BMI 28.15 kg/m   BP Readings from Last 3 Encounters:  04/22/23 128/78  11/26/22 130/68  11/19/22 132/72    Wt Readings from Last 3 Encounters:  04/22/23 201 lb 12.8 oz (91.5 kg)  11/26/22 204 lb 14.4 oz (92.9 kg)  11/19/22 203 lb 12.8 oz (92.4 kg)    Physical Exam Vitals reviewed.  Constitutional:      Appearance: Normal appearance.  HENT:     Mouth/Throat:     Mouth: Mucous membranes are moist.  Eyes:     General: No scleral icterus.    Conjunctiva/sclera: Conjunctivae normal.  Cardiovascular:     Rate and Rhythm: Normal rate. Rhythm irregularly irregular.     Pulses: Normal pulses.     Heart sounds: Murmur heard.     Systolic murmur is present with a grade of 2/6.     No diastolic murmur is present.     No friction rub. No gallop.     Comments: LUSB Pulmonary:     Effort: Pulmonary effort is normal.     Breath sounds: No stridor. No wheezing, rhonchi or rales.  Chest:     Comments: EKG - A flutter with variable AV block, 74 bpm Early repol Q wave in III Unchanged  Abdominal:     General: Abdomen is flat.     Palpations: There is no mass.     Tenderness: There is no abdominal tenderness. There is no guarding.     Hernia: No hernia is present.  Musculoskeletal:        General: Normal range of motion.     Cervical back: Neck supple.     Right lower leg: No edema.     Left lower leg: No edema.  Skin:    General: Skin is warm and dry.  Neurological:     General: No focal deficit present.     Mental Status: He is alert and oriented to person, place, and time.  Psychiatric:        Mood and Affect: Mood normal.        Behavior: Behavior normal.     Lab Results  Component Value Date   WBC 20.5 Repeated and verified X2. (HH) 04/22/2023   HGB 16.4  04/22/2023   HCT 49.2 04/22/2023   PLT 160.0 04/22/2023   GLUCOSE 106 (H) 04/22/2023   CHOL 105 09/23/2022   TRIG 86 09/23/2022   HDL 27 (L) 09/23/2022   LDLCALC 61 09/23/2022   ALT 24 11/26/2022   AST 24 11/26/2022   NA 136 04/22/2023   K 4.4 04/22/2023   CL 102 04/22/2023   CREATININE 1.15 04/22/2023   BUN 17 04/22/2023   CO2 29 04/22/2023   TSH 4.61 (H) 04/22/2023   PSA 3.38 03/28/2019   HGBA1C 6.0 04/22/2023    VAS  US CAROTID Result Date: 01/21/2023 Carotid Arterial Duplex Study Patient Name:  JEFFERY BACHMEIER [facility]  Date of Exam:   01/21/2023 Medical Rec #: [id number]              Accession #:    [id number] Date of Birth: 1935-04-19              Patient Gender: M Patient Age:   10 years Exam Location:  [facility] Procedure:      VAS US CAROTID Referring Phys: Charlton Haws --------------------------------------------------------------------------------  Indications:       Carotid artery disease and Patient denies any cerebrovascular                    or left arm symptoms today. Risk Factors:      Hyperlipidemia, no history of smoking, coronary artery                    disease. Comparison Study:  Prior carotid duplex exam on 12/12/2021 showed highest                    velocities in right proximal ICA 69/18 cm/s and left proximal                    ICA 68/18 cm/s. Performing Technologist: Carlos American RVT, RDCS (AE), RDMS  Examination Guidelines: A complete evaluation includes B-mode imaging, spectral Doppler, color Doppler, and power Doppler as needed of all accessible portions of each vessel. Bilateral testing is considered an integral part of a complete examination. Limited examinations for reoccurring indications may be performed as noted.  Right Carotid Findings: +----------+--------+--------+--------+---------------------+------------------+           PSV cm/sEDV cm/sStenosisPlaque Description   Comments            +----------+--------+--------+--------+---------------------+------------------+ CCA Prox  75      8                                                       +----------+--------+--------+--------+---------------------+------------------+ CCA Distal79      14              focal and calcific   intimal thickening +----------+--------+--------+--------+---------------------+------------------+ ICA Prox  70      12              diffuse, irregular   Shadowing                                            and calcific                            +----------+--------+--------+--------+---------------------+------------------+ ICA Mid   80      20      Normal                                          +----------+--------+--------+--------+---------------------+------------------+ ICA Distal77      16                                                      +----------+--------+--------+--------+---------------------+------------------+  ECA       157     8               diffuse and calcific                    +----------+--------+--------+--------+---------------------+------------------+ +----------+--------+-------+----------------+-------------------+           PSV cm/sEDV cmsDescribe        Arm Pressure (mmHG) +----------+--------+-------+----------------+-------------------+ [id number]            Multiphasic, [id number]                 +----------+--------+-------+----------------+-------------------+ +---------+--------+--+--------+--+---------+ VertebralPSV cm/s46EDV cm/s11Antegrade +---------+--------+--+--------+--+---------+  Left Carotid Findings: +----------+--------+--------+--------+------------------+------------------+           PSV cm/sEDV cm/sStenosisPlaque DescriptionComments           +----------+--------+--------+--------+------------------+------------------+ CCA Prox  62      10                                                    +----------+--------+--------+--------+------------------+------------------+ CCA Distal66      12              focal and calcificintimal thickening +----------+--------+--------+--------+------------------+------------------+ ICA Prox  86      18      1-39%   focal and calcific                   +----------+--------+--------+--------+------------------+------------------+ ICA Mid   67      20                                                   +----------+--------+--------+--------+------------------+------------------+ ICA Distal69      19                                                   +----------+--------+--------+--------+------------------+------------------+ ECA       141     0               focal and calcific                   +----------+--------+--------+--------+------------------+------------------+ +----------+--------+--------+----------------+-------------------+           PSV cm/sEDV cm/sDescribe        Arm Pressure (mmHG) +----------+--------+--------+----------------+-------------------+ Subclavian140             Multiphasic, [id number]                 +----------+--------+--------+----------------+-------------------+ +---------+--------+--+--------+-+---------+ VertebralPSV cm/s44EDV cm/s8Antegrade +---------+--------+--+--------+-+---------+   Summary: Right Carotid: Velocities in the right ICA are consistent with a stable 1-39%                stenosis. Left         Velocities in the left ICA are consistent with a stable 1-39% Carotid:     stenosis. Vertebrals:  Bilateral vertebral arteries demonstrate antegrade flow. Subclavians: Normal flow hemodynamics were seen in bilateral subclavian              arteries. *See table(s) above for measurements and observations. Suggest follow up  study in 12 months for plaque burden. Electronically signed by Lauro Portal MD on 01/21/2023 at 4:30:23 PM.    Final     Assessment & Plan:   TSH elevation- He  has subclinical HypoT. -     Thyroid Panel With TSH; Future  Prediabetes -     Basic metabolic panel with GFR; Future -     Hemoglobin A1c; Future  Paroxysmal atrial fibrillation (HCC) -     Thyroid Panel With TSH; Future  Encounter for general adult medical examination with abnormal findings- Exam completed, labs reviewed, vaccines reviewed and updated, cancer screenings addressed, pt ed material was given.   CLL (chronic lymphocytic leukemia) (HCC)- WBC is stable. -     CBC with Differential/Platelet; Future  Stage 3a chronic kidney disease (HCC)- Will avoid nephrotoxic agents  -     Urinalysis, Routine w reflex microscopic; Future -     Basic metabolic panel with GFR; Future  Hypertension, unspecified type- His BP is well controlled. -     EKG 12-Lead  Atrial flutter by electrocardiogram [facility]) -     Ambulatory referral to Cardiology  Other orders -     EKG     Follow-up: Return in about 3 months (around 07/22/2023).  Sandra Crouch, MD

## 2023-04-22 NOTE — Telephone Encounter (Signed)
 CRITICAL VALUE STICKER  CRITICAL VALUE: WBC of 20.4  RECEIVER (on-site recipient of call): Francisco Larson  DATE & TIME NOTIFIED: 04/22/23 12:33  MESSENGER (representative from lab): Clydie Braun  MD NOTIFIED: Sanda Linger MD  TIME OF NOTIFICATION: 12:35  RESPONSE:  Awaiting response from PCP

## 2023-04-23 ENCOUNTER — Encounter: Payer: Self-pay | Admitting: Internal Medicine

## 2023-04-23 LAB — THYROID PANEL WITH TSH
Free Thyroxine Index: 1.9 (ref 1.4–3.8)
T3 Uptake: 34 % (ref 22–35)
T4, Total: 5.5 ug/dL (ref 4.9–10.5)
TSH: 4.61 m[IU]/L — ABNORMAL HIGH (ref 0.40–4.50)

## 2023-04-25 ENCOUNTER — Encounter: Payer: Self-pay | Admitting: Cardiology

## 2023-05-19 NOTE — Progress Notes (Signed)
 Office Visit    Patient Name: Francisco Larson Date of Encounter: 06/02/2023  Primary Care Provider:  Arcadio Knuckles, MD Primary Cardiologist:  Janelle Mediate, MD Primary Electrophysiologist: None   Past Medical History    Past Medical History:  Diagnosis Date   Arthritis    Coronary artery disease    Hyperlipidemia    Leukemia, chronic lymphocytic (HCC)    SCCA (squamous cell carcinoma) of skin 08/08/2020   Mid Frontal Scalp (in situ) (curet and 5FU)   SCCA (squamous cell carcinoma) of skin 08/08/2020   Mid Parietal Scalp (in situ) (curet and 5FU)   SCCA (squamous cell carcinoma) of skin 08/08/2020   Mid Supratip of Nose (in situ)   Shingles    Squamous cell carcinoma of skin 12/08/2018   right jaw line cis   Vitamin D  deficiency    Past Surgical History:  Procedure Laterality Date   CARDIAC CATHETERIZATION     CARDIOVERSION N/A 12/16/2019   Procedure: CARDIOVERSION;  Surgeon: Luana Rumple, MD;  Location: [facility] ENDOSCOPY;  Service: Cardiovascular;  Laterality: N/A;   CORONARY STENT INTERVENTION N/A 06/27/2022   Procedure: CORONARY STENT INTERVENTION;  Surgeon: Arleen Lacer, MD;  Location: [facility] INVASIVE CV LAB;  Service: Cardiovascular;  Laterality: N/A;   EYE SURGERY     LAPAROSCOPIC TRANS ANAL AND TRANSABDOMINAL RECTAL RESECTION WITH COLOANAL ANASTOMOSIS     LEFT HEART CATH AND CORONARY ANGIOGRAPHY N/A 06/27/2022   Procedure: LEFT HEART CATH AND CORONARY ANGIOGRAPHY;  Surgeon: Arleen Lacer, MD;  Location: [facility] INVASIVE CV LAB;  Service: Cardiovascular;  Laterality: N/A;   SURGERY OF LIP     tumor   TONSILLECTOMY     VASECTOMY      Allergies  Allergies  Allergen Reactions   Azithromycin Itching and Other (See Comments)    Pt stated he thinks this made him dizzy, also     History of Present Illness    Francisco Larson  is a 88 year old male with a PMH of PACs, CAD s/p NSTEMI with DES to mid LAD left circumflex, CLL, HLD, atrial flutter s/p  DCCV, ILR with no recurrence, moderate AAS who presents today for complaint of bradycardia and ZIO monitor results.  Francisco Larson was seen initially in 2016 for complaint of PVCs  He had a known family history of CAD and underwent calcium  scoring for risk stratification that showed score of 285.  He underwent ETT that showed no evidence of ischemia or ST elevation.  He was diagnosed with atrial flutter on 11/2019 and started on Eliquis  and referred to EP.  He underwent DCCV with conversion to sinus rhythm.  He had ILR placed with no recurrence and Eliquis  was discontinued 12/2019.  He was last seen by me 11/2021 doing well with no new cardiac complaints.  He presented to the ED 06/26/2022 with complaint of chest pain elevations in troponin with no ischemic changes in EKG.  NSTEMI was diagnosed patient was started on heparin  drip and underwent LHC that showed 80% stenosed lesion in mid LAD and 90% lesion in proximal circumflex that were treated with DES.  He was started on Plavix  and aspirin  and referred to cardiac rehab.  During patient's initial orientation he was found to have junctional rhythm with rate in the 40s and 50s.  Rhythm strips were reviewed by onsite provider Morey Ar, NP and twelve-lead EKG was completed showing sinus with first-degree AVB and patient was instructed to hold Coreg . 3-day ZIO monitor  results revealed predominantly sinus rhythm with first-degree AVB present and run run of tachycardia that was not sustained and 1 pause occurring lasting 3 seconds.  No further symptomatic bradycardia off coreg . PaceArt reports ok during cardiac rehab HR 54-120 with exercise normal   Carotid duplex 01/21/23 plaque no stenosis   No angina active done with rehab   Golfing again. To see Dr Marven Slimmer latter this month to discuss OAC given ILR showing 1.1% PAF burden   Home Medications    Current Outpatient Medications  Medication Sig Dispense Refill   amLODipine  (NORVASC ) 5 MG tablet TAKE 1  TABLET (5 MG TOTAL) BY MOUTH DAILY. 90 tablet 2   aspirin  EC (ASPIRIN  LOW DOSE) 81 MG tablet TAKE 1 TABLET (81 MG TOTAL) BY MOUTH DAILY. SWALLOW WHOLE. 30 tablet 10   atorvastatin  (LIPITOR ) 80 MG tablet TAKE 1 TABLET BY MOUTH EVERY DAY 90 tablet 3   clopidogrel  (PLAVIX ) 75 MG tablet TAKE 1 TABLET BY MOUTH EVERY DAY 90 tablet 3   Multiple Vitamins-Minerals (CENTRUM SILVER PO) Take 1 tablet by mouth daily with breakfast.     nitroGLYCERIN  (NITROSTAT ) 0.4 MG SL tablet Place 1 tablet (0.4 mg total) under the tongue every 5 (five) minutes as needed for chest pain. 25 tablet 3   TYLENOL  325 MG tablet Take 325-650 mg by mouth every 6 (six) hours as needed for mild pain or headache.     No current facility-administered medications for this visit.     Review of Systems  Please see the history of present illness.    (+) Fatigue (+) Chest discomfort  All other systems reviewed and are otherwise negative except as noted above.  Physical Exam    Wt Readings from Last 3 Encounters:  06/02/23 201 lb (91.2 kg)  04/22/23 201 lb 12.8 oz (91.5 kg)  11/26/22 204 lb 14.4 oz (92.9 kg)   VS: Vitals:   06/02/23 0834  BP: 126/76  Pulse: 65  SpO2: 97%   ,Body mass index is 28.03 kg/m.  Affect appropriate Healthy:  appears stated age HEENT: normal Neck supple with no adenopathy JVP normal no bruits no thyromegaly Lungs clear with no wheezing and good diaphragmatic motion Heart:  S1/S2 no murmur, no rub, gallop or click PMI normal Abdomen: benighn, BS positve, no tenderness, no AAA no bruit.  No HSM or HJR Distal pulses intact with no bruits No edema Neuro non-focal Skin warm and dry No muscular weakness  EKG/LABS/ Recent Cardiac Studies    ECG personally reviewed by me today -none completed today  Lab Results  Component Value Date   WBC 20.5 Repeated and verified X2. (HH) 04/22/2023   HGB 16.4 04/22/2023   HCT 49.2 04/22/2023   MCV 91.7 04/22/2023   PLT 160.0 04/22/2023   Lab  Results  Component Value Date   CREATININE 1.15 04/22/2023   BUN 17 04/22/2023   NA 136 04/22/2023   K 4.4 04/22/2023   CL 102 04/22/2023   CO2 29 04/22/2023   Lab Results  Component Value Date   ALT 24 11/26/2022   AST 24 11/26/2022   ALKPHOS 92 11/26/2022   BILITOT 0.7 11/26/2022   Lab Results  Component Value Date   CHOL 105 09/23/2022   HDL 27 (L) 09/23/2022   LDLCALC 61 09/23/2022   TRIG 86 09/23/2022   CHOLHDL 3.9 09/23/2022    Lab Results  Component Value Date   HGBA1C 6.0 04/22/2023     Assessment & Plan    1.  Coronary artery disease: -s/p NSTEMI 06/2022 with PCI/DES to mid LAD and left circumflex -Patient denies chest pain since previous visit. Continue Lipitor  80 mg daily, Plavix  75 mg daily, ASA 81 mg daily  2.  Symptomatic bradycardia: -3-day Zio patch showed predominantly sinus rhythm with average heart rate of 60 bpm and first-degree AVB present 1 run of VT and 5 runs of SVT. -Today patient denies any symptomatic bradycardia  Observe -Patient's Paceart report 04/19/23   was reviewed showing no malignant rhythms or pauses.  3.  Paroxysmal atrial flutter: -s/p DCCV 12/2019 with ILR placed and no recurrence 3-day ZIO monitor no recurrence -Patient's beta-blocker was discontinued due to bradycardia -Patient denies any symptomatic bradycardia or atrial fibrillation since previous visit. -Patient is scheduled to have follow-up later this month with EP -Recent ILR with 1.1% PAF to see Marven Slimmer to discuss OAC - Mentioned Watchman to him despite age he is well preserved   4.  Chronic lymphocytic leukemia: -Patient currently followed by oncology - WBC 21.7 stable 09/03/22   5.  Hyperlipidemia: -Patient's last LDL was 61 09/23/22 at goal  -Continue Lipitor  80 mg daily  Disposition: Follow-up in 6 months     Medication Adjustments/Labs and Tests Ordered: Current medicines are reviewed at length with the patient today.  Concerns regarding medicines are  outlined above.   Signed, Janelle Mediate, NP 06/02/2023, 10:16 AM [facility]

## 2023-05-25 ENCOUNTER — Ambulatory Visit: Payer: Medicare PPO

## 2023-05-25 DIAGNOSIS — I4892 Unspecified atrial flutter: Secondary | ICD-10-CM

## 2023-05-26 LAB — CUP PACEART REMOTE DEVICE CHECK
Date Time Interrogation Session: 20250511233056
Implantable Pulse Generator Implant Date: 20211221

## 2023-05-27 ENCOUNTER — Ambulatory Visit: Payer: Medicare PPO | Admitting: Podiatry

## 2023-05-27 ENCOUNTER — Encounter: Payer: Self-pay | Admitting: Podiatry

## 2023-05-27 DIAGNOSIS — M79674 Pain in right toe(s): Secondary | ICD-10-CM

## 2023-05-27 DIAGNOSIS — B351 Tinea unguium: Secondary | ICD-10-CM

## 2023-05-27 DIAGNOSIS — M79675 Pain in left toe(s): Secondary | ICD-10-CM

## 2023-05-27 NOTE — Progress Notes (Signed)
  Subjective:  Patient ID: Francisco Larson, male    DOB: June 30, 1935,  MRN: [id number]  88 y.o. male presents painful, elongated thickened toenails x 10 which are symptomatic when wearing enclosed shoe gear. This interferes with his/her daily activities. He will be going to play golf on tomorrow. Chief Complaint  Patient presents with   Nail Problem    RFC   New problem(s): None   PCP is Arcadio Knuckles, MD , and last visit was April 22, 2023.  Allergies  Allergen Reactions   Azithromycin Itching and Other (See Comments)    Pt stated he thinks this made him dizzy, also    Review of Systems: Negative except as noted in the HPI.   Objective:  Francisco Larson is a pleasant 88 y.o. male WD, WN in NAD. AAO x 3.  Vascular Examination: Vascular status intact b/l with palpable pedal pulses. CFT immediate b/l. Pedal hair present. No edema. No pain with calf compression b/l. Skin temperature gradient WNL b/l. No varicosities noted. No cyanosis or clubbing noted.  Neurological Examination: Sensation grossly intact b/l with 10 gram monofilament.   Dermatological Examination: Pedal skin with normal turgor, texture and tone b/l. No open wounds nor interdigital macerations noted. Toenails 1-5 b/l thick, discolored, elongated with subungual debris and pain on dorsal palpation. No corns, calluses nor porokeratotic lesions noted.  Musculoskeletal Examination: Muscle strength 5/5 to b/l LE.  No pain, crepitus noted b/l. No gross pedal deformities. Patient ambulates independently without assistive aids.   Radiographs: None Last A1c:      Latest Ref Rng & Units 04/22/2023   11:14 AM 06/26/2022    6:45 PM  Hemoglobin A1C  Hemoglobin-A1c 4.6 - 6.5 % 6.0  5.7    Assessment:   1. Pain due to onychomycosis of toenails of both feet    Plan:  Patient was evaluated and treated. All patient's and/or POA's questions/concerns addressed on today's visit. Toenails 1-5 debrided in length and  girth without incident. Continue soft, supportive shoe gear daily. Report any pedal injuries to medical professional. Call office if there are any questions/concerns. -Patient/POA to call should there be question/concern in the interim.  Return in about 3 months (around 08/27/2023).  Luella Sager, DPM      [place] LOCATION: [address] [place] [address]                   Office [phone]   [facility] LOCATION: [address] [place], [place] [address] Office [phone]

## 2023-05-31 ENCOUNTER — Ambulatory Visit: Payer: Self-pay | Admitting: Cardiology

## 2023-06-02 ENCOUNTER — Ambulatory Visit: Payer: Medicare PPO | Attending: Cardiovascular Disease | Admitting: Cardiovascular Disease

## 2023-06-02 ENCOUNTER — Encounter: Payer: Self-pay | Admitting: Cardiovascular Disease

## 2023-06-02 VITALS — BP 126/76 | HR 65 | Ht 71.0 in | Wt 201.0 lb

## 2023-06-02 DIAGNOSIS — I10 Essential (primary) hypertension: Secondary | ICD-10-CM | POA: Diagnosis not present

## 2023-06-02 DIAGNOSIS — R001 Bradycardia, unspecified: Secondary | ICD-10-CM | POA: Diagnosis not present

## 2023-06-02 DIAGNOSIS — E785 Hyperlipidemia, unspecified: Secondary | ICD-10-CM

## 2023-06-02 DIAGNOSIS — C9110 Chronic lymphocytic leukemia of B-cell type not having achieved remission: Secondary | ICD-10-CM | POA: Diagnosis not present

## 2023-06-02 DIAGNOSIS — I2510 Atherosclerotic heart disease of native coronary artery without angina pectoris: Secondary | ICD-10-CM | POA: Diagnosis not present

## 2023-06-02 DIAGNOSIS — I4892 Unspecified atrial flutter: Secondary | ICD-10-CM

## 2023-06-02 NOTE — Patient Instructions (Addendum)
 Medication Instructions:  Your physician recommends that you continue on your current medications as directed. Please refer to the Current Medication list given to you today.  *If you need a refill on your cardiac medications before your next appointment, please call your pharmacy*  Lab Work: If you have labs (blood work) drawn today and your tests are completely normal, you will receive your results only by: MyChart Message (if you have MyChart) OR A paper copy in the mail If you have any lab test that is abnormal or we need to change your treatment, we will call you to review the results.  Testing/Procedures: Your physician has requested that you have an echocardiogram in 1 year. Echocardiography is a painless test that uses sound waves to create images of your heart. It provides your doctor with information about the size and shape of your heart and how well your heart's chambers and valves are working. This procedure takes approximately one hour. There are no restrictions for this procedure. Please do NOT wear cologne, perfume, aftershave, or lotions (deodorant is allowed). Please arrive 15 minutes prior to your appointment time.  Please note: We ask at that you not bring children with you during ultrasound (echo/ vascular) testing. Due to room size and safety concerns, children are not allowed in the ultrasound rooms during exams. Our front office staff cannot provide observation of children in our lobby area while testing is being conducted. An adult accompanying a patient to their appointment will only be allowed in the ultrasound room at the discretion of the ultrasound technician under special circumstances. We apologize for any inconvenience. Your physician has requested that you have a carotid duplex in 1 year. This test is an ultrasound of the carotid arteries in your neck. It looks at blood flow through these arteries that supply the brain with blood. Allow one hour for this exam. There  are no restrictions or special instructions.  Follow-Up: At [facility], you and your health needs are our priority.  As part of our continuing mission to provide you with exceptional heart care, our providers are all part of one team.  This team includes your primary Cardiologist (physician) and Advanced Practice Providers or APPs (Physician Assistants and Nurse Practitioners) who all work together to provide you with the care you need, when you need it.  Your next appointment:   1 year  Provider:   Janelle Mediate, MD    We recommend signing up for the patient portal called "MyChart".  Sign up information is provided on this After Visit Summary.  MyChart is used to connect with patients for Virtual Visits (Telemedicine).  Patients are able to view lab/test results, encounter notes, upcoming appointments, etc.  Non-urgent messages can be sent to your provider as well.   To learn more about what you can do with MyChart, go to [email or web address].

## 2023-06-10 NOTE — Addendum Note (Signed)
 Addended by: Edra Govern D on: 06/10/2023 12:17 PM   Modules accepted: Orders

## 2023-06-10 NOTE — Progress Notes (Signed)
 [facility] Summary Report / Loop Recorder

## 2023-06-11 NOTE — Progress Notes (Unsigned)
 Electrophysiology Office Follow up Visit Note:    Date:  06/12/2023   ID:  Francisco Larson, DOB May 26, 1935, MRN [id number]  PCP:  Arcadio Knuckles, MD  [facility] Cardiologist:  Janelle Mediate, MD  [facility] Electrophysiologist:  Boyce Byes, MD    Interval History:     Francisco Larson is a 88 y.o. male who presents for a follow up visit.   The patient last saw Dr. Stann Earnest Jun 02, 2023. I last saw the patient in December 2021.  At that appointment we implanted a loop recorder for atrial fibrillation/flutter surveillance.  The patient has a medical history that includes coronary disease with prior stenting, CLL, hyperlipidemia, atrial flutter, moderate aortic stenosis.  The patient's beta-blocker had to be stopped in the past due to bradycardia.  Recent loop recorder interrogation showed a 1.1% burden of atrial fibrillation.  He presents to discuss stroke risk mitigation strategies.  Today he is doing well.  He is with his wife in clinic.  He reports being quite active.  He played 18 holes of golf recently.  Also goes to the gym most days.  No problems with bleeding.      Past medical, surgical, social and family history were reviewed.  ROS:   Please see the history of present illness.    All other systems reviewed and are negative.  EKGs/Labs/Other Studies Reviewed:    The following studies were reviewed today:  May 26, 2023 Loop recorder interrogation shows 1.1% burden of atrial fibrillation.  April 22, 2023 EKG shows atrial flutter with variable AV block  EKG Interpretation Date/Time:  Friday Jun 12 2023 11:05:01 EDT Ventricular Rate:  75 PR Interval:    QRS Duration:  86 QT Interval:  372 QTC Calculation: 415 R Axis:   65  Text Interpretation: Atrial flutter with variable A-V block Confirmed by [name] [id number] on 06/12/2023 11:09:58 AM    Physical Exam:    VS:  BP 120/88 (BP Location: Left Arm, Patient Position: Sitting, Cuff  Size: Normal)   Pulse 75   Ht 5\' 11"  (1.803 m)   Wt 201 lb (91.2 kg)   SpO2 96%   BMI 28.03 kg/m     Wt Readings from Last 3 Encounters:  06/12/23 201 lb (91.2 kg)  06/02/23 201 lb (91.2 kg)  04/22/23 201 lb 12.8 oz (91.5 kg)     GEN: no distress CARD: Regular rhythm, No MRG RESP: No IWOB. CTAB.      ASSESSMENT:    1. Paroxysmal atrial fibrillation (HCC)   2. Atrial flutter, unspecified type (HCC)   3. Coronary artery disease involving native coronary artery of native heart without angina pectoris   4. Essential hypertension    PLAN:    In order of problems listed above:  #Atrial flutter #Atrial fibrillation Relatively asymptomatic.  Has been on Eliquis  in the past but is currently only on aspirin .  I discussed his stroke risk associated with the atrial fibrillation/flutter.  Given his elevated CHA2DS2-VASc of at least 4 for age, hypertension, coronary artery disease, I have recommended starting Eliquis  5 mg by mouth twice daily.  When he starts that medication he can stop his aspirin  81.  He will continue on Eliquis  and Plavix .  Check CBC in 4 to 6 weeks  We discussed cardioversion during today's clinic appointment.  He has had trouble with bradycardia in the past and he is completely asymptomatic with his atrial flutter and thus a conservative strategy will be employed.  #  Coronary artery disease No ischemic symptoms today.  Continue Plavix  as above.  #Hypertension At goal today.  Recommend checking blood pressures 1-2 times per week at home and recording the values.  Recommend bringing these recordings to the primary care physician. Continue amlodipine   Follow-up with EP APP in 1 year   Signed, [name], MD, [facility], [facility] 06/12/2023 11:10 AM    Electrophysiology [facility]

## 2023-06-12 ENCOUNTER — Encounter: Payer: Self-pay | Admitting: Cardiology

## 2023-06-12 ENCOUNTER — Ambulatory Visit: Attending: Cardiology | Admitting: Cardiology

## 2023-06-12 VITALS — BP 120/88 | HR 75 | Ht 71.0 in | Wt 201.0 lb

## 2023-06-12 DIAGNOSIS — I480 Paroxysmal atrial fibrillation: Secondary | ICD-10-CM

## 2023-06-12 DIAGNOSIS — I4892 Unspecified atrial flutter: Secondary | ICD-10-CM | POA: Diagnosis not present

## 2023-06-12 DIAGNOSIS — I2510 Atherosclerotic heart disease of native coronary artery without angina pectoris: Secondary | ICD-10-CM | POA: Diagnosis not present

## 2023-06-12 DIAGNOSIS — I10 Essential (primary) hypertension: Secondary | ICD-10-CM

## 2023-06-12 MED ORDER — APIXABAN 5 MG PO TABS: 5.0000 mg | ORAL_TABLET | Freq: Two times a day (BID) | ORAL | 0 refills | Status: AC

## 2023-06-12 MED ORDER — APIXABAN 5 MG PO TABS: 5.0000 mg | ORAL_TABLET | Freq: Two times a day (BID) | ORAL | 3 refills | Status: AC

## 2023-06-12 NOTE — Addendum Note (Signed)
 Addended by: Elyn Han on: 06/12/2023 11:58 AM   Modules accepted: Orders

## 2023-06-12 NOTE — Patient Instructions (Addendum)
 Medication Instructions:  Your physician has recommended you make the following change in your medication:  1) START taking Eliquis  5 mg twice daily  2) STOP taking aspirin   *If you need a refill on your cardiac medications before your next appointment, please call your pharmacy*  Lab Work: CBC - in 4 weeks - you can have these drawn at any [facility] location  Follow-Up: At [facility], you and your health needs are our priority.  As part of our continuing mission to provide you with exceptional heart care, our providers are all part of one team.  This team includes your primary Cardiologist (physician) and Advanced Practice Providers or APPs (Physician Assistants and Nurse Practitioners) who all work together to provide you with the care you need, when you need it.  Your next appointment:   1 year  Provider:   You will see one of the following Advanced Practice Providers on your designated Care Team:   Mertha Abrahams, Kennard Pea [address] [place], PA-C Suzann Riddle, NP Creighton Doffing, NP

## 2023-06-23 ENCOUNTER — Other Ambulatory Visit: Payer: Self-pay | Admitting: *Deleted

## 2023-06-23 MED ORDER — CLOPIDOGREL BISULFATE 75 MG PO TABS: 75.0000 mg | ORAL_TABLET | Freq: Every day | ORAL | 3 refills | Status: AC

## 2023-06-25 ENCOUNTER — Ambulatory Visit

## 2023-06-25 DIAGNOSIS — I480 Paroxysmal atrial fibrillation: Secondary | ICD-10-CM | POA: Diagnosis not present

## 2023-06-26 ENCOUNTER — Ambulatory Visit: Payer: Self-pay | Admitting: Cardiology

## 2023-07-08 ENCOUNTER — Other Ambulatory Visit: Payer: Self-pay

## 2023-07-08 MED ORDER — AMLODIPINE BESYLATE 5 MG PO TABS: 5.0000 mg | ORAL_TABLET | Freq: Every day | ORAL | 3 refills | Status: AC

## 2023-07-14 NOTE — Progress Notes (Signed)
 [facility] Summary Report / Loop Recorder

## 2023-07-14 NOTE — Addendum Note (Signed)
 Addended by: TAWNI DRILLING D on: 07/14/2023 12:10 PM   Modules accepted: Orders

## 2023-07-27 ENCOUNTER — Ambulatory Visit: Payer: Self-pay | Admitting: Cardiology

## 2023-07-27 ENCOUNTER — Ambulatory Visit (INDEPENDENT_AMBULATORY_CARE_PROVIDER_SITE_OTHER)

## 2023-07-27 DIAGNOSIS — I480 Paroxysmal atrial fibrillation: Secondary | ICD-10-CM | POA: Diagnosis not present

## 2023-07-27 LAB — CUP PACEART REMOTE DEVICE CHECK
Date Time Interrogation Session: 20250713231630
Implantable Pulse Generator Implant Date: 20211221

## 2023-08-14 NOTE — Progress Notes (Signed)
 [facility] Summary Report / Loop Recorder

## 2023-08-14 NOTE — Addendum Note (Signed)
 Addended by: VICCI SELLER A on: 08/14/2023 11:53 AM   Modules accepted: Orders

## 2023-08-18 ENCOUNTER — Ambulatory Visit (INDEPENDENT_AMBULATORY_CARE_PROVIDER_SITE_OTHER): Payer: Medicare PPO

## 2023-08-18 VITALS — Ht 71.0 in | Wt 201.0 lb

## 2023-08-18 DIAGNOSIS — Z0000 Encounter for general adult medical examination without abnormal findings: Secondary | ICD-10-CM | POA: Diagnosis not present

## 2023-08-18 NOTE — Patient Instructions (Signed)
 Mr. Garrido , Thank you for taking time out of your busy schedule to complete your Annual Wellness Visit with me. I enjoyed our conversation and look forward to speaking with you again next year. I, as well as your care team,  appreciate your ongoing commitment to your health goals. Please review the following plan we discussed and let me know if I can assist you in the future. Your Game plan/ To Do List    Referrals: If you haven't heard from the office you've been referred to, please reach out to them at the phone provided.   Follow up Visits: We will see or speak with you next year for your Next Medicare AWV with our clinical staff Have you seen your provider in the last 6 months (3 months if uncontrolled diabetes)? Yes  Clinician Recommendations:  Aim for 30 minutes of exercise or brisk walking, 6-8 glasses of water, and 5 servings of fruits and vegetables each day. Keep up the good work.      This is a list of the screenings recommended for you:  Health Maintenance  Topic Date Due   COVID-19 Vaccine (7 - 2024-25 season) 09/14/2022   Flu Shot  08/14/2023   Medicare Annual Wellness Visit  08/17/2024   DTaP/Tdap/Td vaccine (3 - Td or Tdap) 03/19/2027   Pneumococcal Vaccine for age over 72  Completed   Zoster (Shingles) Vaccine  Completed   Hepatitis B Vaccine  Aged Out   HPV Vaccine  Aged Out   Meningitis B Vaccine  Aged Out    Advanced directives: (In Chart) A copy of your advanced directives are scanned into your chart should your provider ever need it. Advance Care Planning is important because it:  [x]  Makes sure you receive the medical care that is consistent with your values, goals, and preferences  [x]  It provides guidance to your family and loved ones and reduces their decisional burden about whether or not they are making the right decisions based on your wishes.  Follow the link provided in your after visit summary or read over the paperwork we have mailed to you to help  you started getting your Advance Directives in place. If you need assistance in completing these, please reach out to us  so that we can help you!  See attachments for Preventive Care and Fall Prevention Tips.

## 2023-08-18 NOTE — Progress Notes (Signed)
 Subjective:   Francisco Larson is a 88 y.o. who presents for a Medicare Wellness preventive visit.  As a reminder, Annual Wellness Visits don't include a physical exam, and some assessments may be limited, especially if this visit is performed virtually. We may recommend an in-person follow-up visit with your provider if needed.  Visit Complete: Virtual I connected with  Francisco Larson on 08/18/23 by a audio enabled telemedicine application and verified that I am speaking with the correct person using two identifiers.  Patient Location: Home  Provider Location: Home Office  I discussed the limitations of evaluation and management by telemedicine. The patient expressed understanding and agreed to proceed.  Vital Signs: Because this visit was a virtual/telehealth visit, some criteria may be missing or patient reported. Any vitals not documented were not able to be obtained and vitals that have been documented are patient reported.  VideoDeclined- This patient declined Librarian, academic. Therefore the visit was completed with audio only.  Persons Participating in Visit: Patient.  AWV Questionnaire: Yes: Patient Medicare AWV questionnaire was completed by the patient on 08/14/2023; I have confirmed that all information answered by patient is correct and no changes since this date.  Cardiac Risk Factors include: male gender;advanced age (>62men, >23 women);Other (see comment);hypertension;dyslipidemia, Risk factor comments: A-Fib, BPH, CKD stage 3a     Objective:    Today's Vitals   08/18/23 0917  Weight: 201 lb (91.2 kg)  Height: 5' 11 (1.803 m)   Body mass index is 28.03 kg/m.     08/18/2023    9:26 AM 09/03/2022   10:14 AM 08/12/2022    8:25 AM 07/22/2021    1:42 PM 12/16/2019    6:41 AM 03/19/2016   12:42 PM 10/04/2014    1:27 PM  Advanced Directives  Does Patient Have a Medical Advance Directive? Yes No Yes Yes Yes Yes  Yes   Type of  Estate agent of [place];Living will  Healthcare Power of [place];Living will Living will;Healthcare Power of [employer] Power of [place];Living will Healthcare Power of [place];Living will Healthcare Power of [place];Living will   Does patient want to make changes to medical advance directive? No - Patient declined   No - Patient declined   No - Patient declined   Copy of Healthcare Power of Attorney in Chart? Yes - validated most recent copy scanned in chart (See row information)  No - copy requested Yes - validated most recent copy scanned in chart (See row information) No - copy requested No - copy requested  No - copy requested      Data saved with a previous flowsheet row definition    Current Medications (verified) Outpatient Encounter Medications as of 08/18/2023  Medication Sig   amLODipine  (NORVASC ) 5 MG tablet Take 1 tablet (5 mg total) by mouth daily.   apixaban  (ELIQUIS ) 5 MG TABS tablet Take 1 tablet (5 mg total) by mouth 2 (two) times daily.   atorvastatin  (LIPITOR ) 80 MG tablet TAKE 1 TABLET BY MOUTH EVERY DAY   betamethasone, augmented, (DIPROLENE) 0.05 % lotion Apply topically.   clopidogrel  (PLAVIX ) 75 MG tablet Take 1 tablet (75 mg total) by mouth daily.   Multiple Vitamins-Minerals (CENTRUM SILVER PO) Take 1 tablet by mouth daily with breakfast.   nitroGLYCERIN  (NITROSTAT ) 0.4 MG SL tablet Place 1 tablet (0.4 mg total) under the tongue every 5 (five) minutes as needed for chest pain.   TYLENOL  325 MG tablet Take 325-650 mg by  mouth every 6 (six) hours as needed for mild pain or headache.   apixaban  (ELIQUIS ) 5 MG TABS tablet Take 1 tablet (5 mg total) by mouth 2 (two) times daily.   No facility-administered encounter medications on file as of 08/18/2023.    Allergies (verified) Azithromycin   History: Past Medical History:  Diagnosis Date   Arthritis    Coronary artery disease    Hyperlipidemia    Leukemia, chronic lymphocytic (HCC)     SCCA (squamous cell carcinoma) of skin 08/08/2020   Mid Frontal Scalp (in situ) (curet and 5FU)   SCCA (squamous cell carcinoma) of skin 08/08/2020   Mid Parietal Scalp (in situ) (curet and 5FU)   SCCA (squamous cell carcinoma) of skin 08/08/2020   Mid Supratip of Nose (in situ)   Shingles    Squamous cell carcinoma of skin 12/08/2018   right jaw line cis   Vitamin D  deficiency    Past Surgical History:  Procedure Laterality Date   CARDIAC CATHETERIZATION     CARDIOVERSION N/A 12/16/2019   Procedure: CARDIOVERSION;  Surgeon: Francyne Headland, MD;  Location: [facility] ENDOSCOPY;  Service: Cardiovascular;  Laterality: N/A;   CORONARY STENT INTERVENTION N/A 06/27/2022   Procedure: CORONARY STENT INTERVENTION;  Surgeon: Anner Alm ORN, MD;  Location: [facility] INVASIVE CV LAB;  Service: Cardiovascular;  Laterality: N/A;   EYE SURGERY     LAPAROSCOPIC TRANS ANAL AND TRANSABDOMINAL RECTAL RESECTION WITH COLOANAL ANASTOMOSIS     LEFT HEART CATH AND CORONARY ANGIOGRAPHY N/A 06/27/2022   Procedure: LEFT HEART CATH AND CORONARY ANGIOGRAPHY;  Surgeon: Anner Alm ORN, MD;  Location: [facility] INVASIVE CV LAB;  Service: Cardiovascular;  Laterality: N/A;   SURGERY OF LIP     tumor   TONSILLECTOMY     VASECTOMY     Family History  Problem Relation Age of Onset   Colon cancer Sister    Hyperlipidemia Sister    Stroke Sister    Heart disease Father    Emphysema Mother        never smoker, but exposed to 2nd hand from spouse   Cancer Mother    COPD Maternal Grandmother        never smoker   Stroke Maternal Grandfather    Diabetes Paternal Grandmother    Mental illness Paternal Grandmother    Asthma Sister    Social History   Socioeconomic History   Marital status: Married    Spouse name: Francisco Larson   Number of children: 2   Years of education: 18   Highest education level: Doctorate  Occupational History   Occupation: Retired    Comment: Professor [employer]  Tobacco Use   Smoking status: Never    Smokeless tobacco: Never  Vaping Use   Vaping status: Never Used  Substance and Sexual Activity   Alcohol use: No   Drug use: No   Sexual activity: Not on file  Other Topics Concern   Not on file  Social History Narrative   Lives with wife/2025   Social Drivers of Health   Financial Resource Strain: Low Risk  (08/14/2023)   Overall Financial Resource Strain (CARDIA)    Difficulty of Paying Living Expenses: Not hard at all  Food Insecurity: No Food Insecurity (08/14/2023)   Hunger Vital Sign    Worried About Running Out of Food in the Last Year: Never true    Ran Out of Food in the Last Year: Never true  Transportation Needs: No Transportation Needs (08/14/2023)   PRAPARE - Transportation  Lack of Transportation (Medical): No    Lack of Transportation (Non-Medical): No  Physical Activity: Sufficiently Active (08/14/2023)   Exercise Vital Sign    Days of Exercise per Week: 6 days    Minutes of Exercise per Session: 60 min  Stress: Stress Concern Present (08/14/2023)   [employer] of Occupational Health - Occupational Stress Questionnaire    Feeling of Stress: To some extent  Social Connections: Socially Integrated (08/14/2023)   Social Connection and Isolation Panel    Frequency of Communication with Friends and Family: Twice a week    Frequency of Social Gatherings with Friends and Family: Twice a week    Attends Religious Services: More than 4 times per year    Active Member of [employer] or Organizations: Yes    Attends Engineer, structural: More than 4 times per year    Marital Status: Married    Tobacco Counseling Counseling given: Not Answered    Clinical Intake:  Pre-visit preparation completed: Yes  Pain : No/denies pain     BMI - recorded: 28.03 Nutritional Status: BMI 25 -29 Overweight Nutritional Risks: None Diabetes: No  Lab Results  Component Value Date   HGBA1C 6.0 04/22/2023   HGBA1C 5.7 (H) 06/26/2022   HGBA1C 6.1 04/09/2022     How  often do you need to have someone help you when you read instructions, pamphlets, or other written materials from your doctor or pharmacy?: 1 - Never  Interpreter Needed?: No  Information entered by :: Francisco Larson, RMA   Activities of Daily Living     08/14/2023    5:04 PM  In your present state of health, do you have any difficulty performing the following activities:  Hearing? 1  Comment Wears hearing aides  Vision? 0  Difficulty concentrating or making decisions? 0  Walking or climbing stairs? 0  Dressing or bathing? 0  Doing errands, shopping? 0  Preparing Food and eating ? N  Using the Toilet? N  In the past six months, have you accidently leaked urine? Y  Do you have problems with loss of bowel control? N  Managing your Medications? N  Managing your Finances? N  Housekeeping or managing your Housekeeping? N    Patient Care Team: Joshua Debby CROME, MD as PCP - General (Internal Medicine) Delford Maude BROCKS, MD as PCP - Cardiology (Cardiology) Cindie Ole DASEN, MD as PCP - Electrophysiology (Cardiology) Delford Maude BROCKS, MD as Consulting Physician (Cardiology) Ethyl Lenis, MD as Consulting Physician (General Surgery) Claudene Victory ORN, MD (Inactive) as Consulting Physician (Cardiology) Livingston Rigg, MD as Consulting Physician (Dermatology) Cindie Ole DASEN, MD as Consulting Physician (Cardiology) Federico Norleen DASEN MADISON, MD as Consulting Physician (Hematology and Oncology) Robinson Mayo, OD as Referring Physician (Optometry)  I have updated your Care Teams any recent Medical Services you may have received from other providers in the past year.     Assessment:   This is a routine wellness examination for Francisco Larson.  Hearing/Vision screen Hearing Screening - Comments:: Wears hearing aides Vision Screening - Comments:: Wears eyeglasses/ [facility]   Goals Addressed             This Visit's Progress    My goal is to shed a few pounds.   On track       Depression Screen     08/18/2023    9:28 AM 04/22/2023    9:55 AM 11/07/2022    9:25 AM 08/12/2022   10:03 AM 07/22/2022  9:33 AM 07/22/2021    1:40 PM 04/01/2021    7:58 AM  PHQ 2/9 Scores  PHQ - 2 Score 0 0 0 0 0 0 0  PHQ- 9 Score 0  0 1 1      Fall Risk     08/14/2023    5:04 PM 04/22/2023    9:55 AM 11/07/2022    8:31 AM 11/05/2022    9:00 AM 11/03/2022    9:00 AM  Fall Risk   Falls in the past year? 0 0 1 1 1   Number falls in past yr: 0 0 1 1 1   Injury with Fall? 0 0 0 0 0  Risk for fall due to :  No Fall Risks Orthopedic patient;History of fall(s);Impaired balance/gait;Impaired mobility;Impaired vision;Medication side effect History of fall(s);Impaired balance/gait;Impaired mobility;Impaired vision;Medication side effect;Orthopedic patient History of fall(s);Impaired balance/gait;Impaired mobility;Impaired vision;Medication side effect;Orthopedic patient  Follow up Falls evaluation completed;Falls prevention discussed Falls evaluation completed Falls evaluation completed Falls evaluation completed Falls evaluation completed    MEDICARE RISK AT HOME:  Medicare Risk at Home Any stairs in or around the home?: (Patient-Rptd) No Home free of loose throw rugs in walkways, pet beds, electrical cords, etc?: (Patient-Rptd) Yes Adequate lighting in your home to reduce risk of falls?: (Patient-Rptd) Yes Life alert?: (Patient-Rptd) No Use of a cane, walker or w/c?: (Patient-Rptd) No Grab bars in the bathroom?: (Patient-Rptd) Yes Shower chair or bench in shower?: (Patient-Rptd) No Elevated toilet seat or a handicapped toilet?: (Patient-Rptd) Yes  TIMED UP AND GO:  Was the test performed?  No  Cognitive Function: Declined/Normal: No cognitive concerns noted by patient or family. Patient alert, oriented, able to answer questions appropriately and recall recent events. No signs of memory loss or confusion.        08/12/2022    8:25 AM 07/22/2021    1:50 PM  6CIT Screen  What Year?   0 points  What month?  0 points  What time? 0 points 0 points  Count back from 20 0 points 0 points  Months in reverse 0 points 0 points  Repeat phrase 0 points 0 points  Total Score  0 points    Immunizations Immunization History  Administered Date(s) Administered   Fluad Quad(high Dose 65+) 11/22/2018, 11/14/2019, 11/22/2020, 11/12/2021   Fluad Trivalent(High Dose 65+) 10/25/2022   Influenza Split 11/27/2011   Influenza, High Dose Seasonal PF 11/08/2015, 11/05/2016, 11/25/2017   Influenza,inj,Quad PF,6+ Mos 11/29/2012, 10/31/2013   Influenza-Unspecified 11/17/2014   PFIZER(Purple Top)SARS-COV-2 Vaccination 01/28/2019, 02/17/2019, 09/05/2019, 04/12/2020   PNEUMOCOCCAL CONJUGATE-20 10/06/2022   Pfizer Covid-19 Vaccine Bivalent Booster 66yrs & up 09/19/2020, 06/26/2021   Pneumococcal Conjugate-13 02/28/2014   Pneumococcal Polysaccharide-23 11/20/2009, 06/21/2015, 04/01/2021   Tdap 11/25/2005, 03/18/2017   Zoster Recombinant(Shingrix ) 03/29/2020, 06/06/2020    Screening Tests Health Maintenance  Topic Date Due   COVID-19 Vaccine (7 - 2024-25 season) 09/14/2022   INFLUENZA VACCINE  08/14/2023   Medicare Annual Wellness (AWV)  08/17/2024   DTaP/Tdap/Td (3 - Td or Tdap) 03/19/2027   Pneumococcal Vaccine: 50+ Years  Completed   Zoster Vaccines- Shingrix   Completed   Hepatitis B Vaccines  Aged Out   HPV VACCINES  Aged Out   Meningococcal B Vaccine  Aged Out    Health Maintenance  Health Maintenance Due  Topic Date Due   COVID-19 Vaccine (7 - 2024-25 season) 09/14/2022   INFLUENZA VACCINE  08/14/2023   Health Maintenance Items Addressed: See Nurse Notes at the end of this note  Additional Screening:  Vision Screening: Recommended annual ophthalmology exams for early detection of glaucoma and other disorders of the eye. Would you like a referral to an eye doctor? No    Dental Screening: Recommended annual dental exams for proper oral hygiene  Community Resource  Referral / Chronic Care Management: CRR required this visit?  No   CCM required this visit?  No   Plan:    I have personally reviewed and noted the following in the patient's chart:   Medical and social history Use of alcohol, tobacco or illicit drugs  Current medications and supplements including opioid prescriptions. Patient is not currently taking opioid prescriptions. Functional ability and status Nutritional status Physical activity Advanced directives List of other physicians Hospitalizations, surgeries, and ER visits in previous 12 months Vitals Screenings to include cognitive, depression, and falls Referrals and appointments  In addition, I have reviewed and discussed with patient certain preventive protocols, quality metrics, and best practice recommendations. A written personalized care plan for preventive services as well as general preventive health recommendations were provided to patient.   Cozetta Seif L Tyller Bowlby, CMA   08/18/2023   After Visit Summary: (MyChart) Due to this being a telephonic visit, the after visit summary with patients personalized plan was offered to patient via MyChart   Notes: Nothing significant to report at this time.

## 2023-08-27 ENCOUNTER — Ambulatory Visit (INDEPENDENT_AMBULATORY_CARE_PROVIDER_SITE_OTHER)

## 2023-08-27 DIAGNOSIS — I480 Paroxysmal atrial fibrillation: Secondary | ICD-10-CM

## 2023-08-28 LAB — CUP PACEART REMOTE DEVICE CHECK
Date Time Interrogation Session: 20250813230927
Implantable Pulse Generator Implant Date: 20211221

## 2023-09-04 ENCOUNTER — Ambulatory Visit: Payer: Self-pay | Admitting: Cardiology

## 2023-09-09 ENCOUNTER — Ambulatory Visit: Admitting: Podiatry

## 2023-09-09 ENCOUNTER — Encounter: Payer: Self-pay | Admitting: Podiatry

## 2023-09-09 DIAGNOSIS — M79674 Pain in right toe(s): Secondary | ICD-10-CM | POA: Diagnosis not present

## 2023-09-09 DIAGNOSIS — M79675 Pain in left toe(s): Secondary | ICD-10-CM

## 2023-09-09 DIAGNOSIS — B351 Tinea unguium: Secondary | ICD-10-CM | POA: Diagnosis not present

## 2023-09-09 NOTE — Progress Notes (Signed)
  Subjective:  Patient ID: Francisco Larson, male    DOB: 08-07-1935,  MRN: [id number]  Francisco Larson presents to clinic today for painful thick toenails that are difficult to trim. Pain interferes with ambulation. Aggravating factors include wearing enclosed shoe gear. Pain is relieved with periodic professional debridement.  Chief Complaint  Patient presents with   RFC     RFC non diabetic toenail trim. LOV with PCP 04/22/23.   New problem(s): None.   PCP is Joshua Debby CROME, MD.  Allergies  Allergen Reactions   Azithromycin Itching and Other (See Comments)    Pt stated he thinks this made him dizzy, also    Review of Systems: Negative except as noted in the HPI.  Objective: No changes noted in today's physical examination. There were no vitals filed for this visit. Francisco Larson is a pleasant 88 y.o. male WD, WN in NAD. AAO x 3.  Vascular Examination: Vascular status intact b/l with palpable pedal pulses. CFT immediate b/l. Pedal hair present. No edema. No pain with calf compression b/l. Skin temperature gradient WNL b/l. No varicosities noted. No cyanosis or clubbing noted.  Neurological Examination: Sensation grossly intact b/l with 10 gram monofilament.   Dermatological Examination: Pedal skin with normal turgor, texture and tone b/l. No open wounds nor interdigital macerations noted. Toenails 1-5 b/l thick, discolored, elongated with subungual debris and pain on dorsal palpation. No corns, calluses nor porokeratotic lesions noted.  Musculoskeletal Examination: Muscle strength 5/5 to b/l LE.  No pain, crepitus noted b/l. No gross pedal deformities. Patient ambulates independently without assistive aids.   Radiographs: None  Assessment/Plan: 1. Pain due to onychomycosis of toenails of both feet     Consent given for treatment. Patient examined. All patient's and/or POA's questions/concerns addressed on today's visit. Mycotic toenails 1-5 debrided in  length and girth without incident. Treatment was provided by assistant Francisco Larson under my supervision. Continue soft, supportive shoe gear daily. Report any pedal injuries to medical professional. Call office if there are any quesitons/concerns. -Patient/POA to call should there be question/concern in the interim.   Return in about 3 months (around 12/10/2023).  Francisco Larson, DPM      [facility] LOCATION: [address] [place] [address]                   Office [phone]   [facility] LOCATION: [address] [place], [place] [address] Office [phone]

## 2023-09-11 ENCOUNTER — Other Ambulatory Visit: Payer: Self-pay

## 2023-09-11 MED ORDER — ATORVASTATIN CALCIUM 80 MG PO TABS: 80.0000 mg | ORAL_TABLET | Freq: Every day | ORAL | 2 refills | Status: AC

## 2023-09-12 ENCOUNTER — Encounter: Payer: Self-pay | Admitting: Podiatry

## 2023-09-16 DIAGNOSIS — L821 Other seborrheic keratosis: Secondary | ICD-10-CM | POA: Diagnosis not present

## 2023-09-16 DIAGNOSIS — D485 Neoplasm of uncertain behavior of skin: Secondary | ICD-10-CM | POA: Diagnosis not present

## 2023-09-16 DIAGNOSIS — L720 Epidermal cyst: Secondary | ICD-10-CM | POA: Diagnosis not present

## 2023-09-16 DIAGNOSIS — Z85828 Personal history of other malignant neoplasm of skin: Secondary | ICD-10-CM | POA: Diagnosis not present

## 2023-09-28 ENCOUNTER — Ambulatory Visit (INDEPENDENT_AMBULATORY_CARE_PROVIDER_SITE_OTHER)

## 2023-09-28 DIAGNOSIS — I480 Paroxysmal atrial fibrillation: Secondary | ICD-10-CM

## 2023-09-29 LAB — CUP PACEART REMOTE DEVICE CHECK
Date Time Interrogation Session: 20250913230545
Implantable Pulse Generator Implant Date: 20211221

## 2023-10-01 ENCOUNTER — Ambulatory Visit: Payer: Self-pay | Admitting: Cardiology

## 2023-10-05 NOTE — Progress Notes (Signed)
 Remote Loop Recorder Transmission

## 2023-10-07 NOTE — Progress Notes (Signed)
 Remote Loop Recorder Transmission

## 2023-10-22 NOTE — Progress Notes (Signed)
 Remote Loop Recorder Transmission

## 2023-10-29 ENCOUNTER — Encounter

## 2023-10-30 ENCOUNTER — Ambulatory Visit

## 2023-10-30 DIAGNOSIS — I480 Paroxysmal atrial fibrillation: Secondary | ICD-10-CM

## 2023-11-01 LAB — CUP PACEART REMOTE DEVICE CHECK
Date Time Interrogation Session: 20251016230806
Implantable Pulse Generator Implant Date: 20211221

## 2023-11-02 ENCOUNTER — Ambulatory Visit: Payer: Self-pay | Admitting: Cardiology

## 2023-11-02 DIAGNOSIS — N1831 Chronic kidney disease, stage 3a: Secondary | ICD-10-CM | POA: Diagnosis not present

## 2023-11-02 DIAGNOSIS — C9510 Chronic leukemia of unspecified cell type not having achieved remission: Secondary | ICD-10-CM | POA: Diagnosis not present

## 2023-11-02 DIAGNOSIS — D6869 Other thrombophilia: Secondary | ICD-10-CM | POA: Diagnosis not present

## 2023-11-02 DIAGNOSIS — D6949 Other primary thrombocytopenia: Secondary | ICD-10-CM | POA: Diagnosis not present

## 2023-11-02 DIAGNOSIS — I495 Sick sinus syndrome: Secondary | ICD-10-CM | POA: Diagnosis not present

## 2023-11-02 DIAGNOSIS — I4891 Unspecified atrial fibrillation: Secondary | ICD-10-CM | POA: Diagnosis not present

## 2023-11-02 DIAGNOSIS — I4892 Unspecified atrial flutter: Secondary | ICD-10-CM | POA: Diagnosis not present

## 2023-11-02 DIAGNOSIS — I25119 Atherosclerotic heart disease of native coronary artery with unspecified angina pectoris: Secondary | ICD-10-CM | POA: Diagnosis not present

## 2023-11-02 DIAGNOSIS — E785 Hyperlipidemia, unspecified: Secondary | ICD-10-CM | POA: Diagnosis not present

## 2023-11-03 NOTE — Progress Notes (Signed)
 Remote Loop Recorder Transmission

## 2023-11-25 ENCOUNTER — Other Ambulatory Visit: Payer: Self-pay | Admitting: *Deleted

## 2023-11-25 ENCOUNTER — Inpatient Hospital Stay: Payer: Medicare PPO | Admitting: Hematology and Oncology

## 2023-11-25 ENCOUNTER — Inpatient Hospital Stay: Payer: Medicare PPO | Attending: Hematology and Oncology

## 2023-11-25 VITALS — BP 129/71 | HR 64 | Temp 97.8°F | Resp 13 | Wt 204.1 lb

## 2023-11-25 DIAGNOSIS — C9110 Chronic lymphocytic leukemia of B-cell type not having achieved remission: Secondary | ICD-10-CM

## 2023-11-25 DIAGNOSIS — Z79899 Other long term (current) drug therapy: Secondary | ICD-10-CM | POA: Insufficient documentation

## 2023-11-25 LAB — CMP (CANCER CENTER ONLY)
ALT: 18 U/L (ref 0–44)
AST: 23 U/L (ref 15–41)
Albumin: 3.6 g/dL (ref 3.5–5.0)
Alkaline Phosphatase: 85 U/L (ref 38–126)
Anion gap: 3 — ABNORMAL LOW (ref 5–15)
BUN: 12 mg/dL (ref 8–23)
CO2: 30 mmol/L (ref 22–32)
Calcium: 9 mg/dL (ref 8.9–10.3)
Chloride: 105 mmol/L (ref 98–111)
Creatinine: 1.06 mg/dL (ref 0.61–1.24)
GFR, Estimated: >60 mL/min (ref >60)
Glucose, Bld: 142 mg/dL — ABNORMAL HIGH (ref 70–99)
Potassium: 4.6 mmol/L (ref 3.5–5.1)
Sodium: 138 mmol/L (ref 135–145)
Total Bilirubin: 0.7 mg/dL (ref 0.0–1.2)
Total Protein: 6.2 g/dL — ABNORMAL LOW (ref 6.5–8.1)

## 2023-11-25 LAB — CBC WITH DIFFERENTIAL (CANCER CENTER ONLY)
Abs Immature Granulocytes: 0.02 K/uL (ref 0.00–0.07)
Basophils Absolute: 0.1 K/uL (ref 0.0–0.1)
Basophils Relative: 0 %
Eosinophils Absolute: 0.2 K/uL (ref 0.0–0.5)
Eosinophils Relative: 1 %
HCT: 46.9 % (ref 39.0–52.0)
Hemoglobin: 15.6 g/dL (ref 13.0–17.0)
Immature Granulocytes: 0 %
Lymphocytes Relative: 82 %
Lymphs Abs: 10.8 K/uL — ABNORMAL HIGH (ref 0.7–4.0)
MCH: 29.8 pg (ref 26.0–34.0)
MCHC: 33.3 g/dL (ref 30.0–36.0)
MCV: 89.7 fL (ref 80.0–100.0)
Monocytes Absolute: 0.7 K/uL (ref 0.1–1.0)
Monocytes Relative: 5 %
Neutro Abs: 1.6 K/uL — ABNORMAL LOW (ref 1.7–7.7)
Neutrophils Relative %: 12 %
Platelet Count: 150 K/uL (ref 150–400)
RBC: 5.23 MIL/uL (ref 4.22–5.81)
RDW: 13.8 % (ref 11.5–15.5)
Smear Review: NORMAL
WBC Count: 13.4 K/uL — ABNORMAL HIGH (ref 4.0–10.5)
nRBC: 0 % (ref 0.0–0.2)

## 2023-11-25 LAB — LACTATE DEHYDROGENASE: LDH: 173 U/L (ref 105–235)

## 2023-11-25 NOTE — Progress Notes (Signed)
 [facility] [phone]   [phone]  PROGRESS NOTE  Patient Care Team: Joshua Debby CROME, MD as PCP - General (Internal Medicine) Delford Maude BROCKS, MD as PCP - Cardiology (Cardiology) Cindie Ole DASEN, MD as PCP - Electrophysiology (Cardiology) Delford Maude BROCKS, MD as Consulting Physician (Cardiology) Ethyl Lenis, MD as Consulting Physician (General Surgery) Claudene Victory ORN, MD (Inactive) as Consulting Physician (Cardiology) Livingston Rigg, MD as Consulting Physician (Dermatology) Cindie Ole DASEN, MD as Consulting Physician (Cardiology) Federico Norleen DASEN MADISON, MD as Consulting Physician (Hematology and Oncology) Robinson Mayo, OD as Referring Physician (Optometry)  Hematological/Oncological History # CLL Rai Stage 0 05/05/1995: diagnosis of CLL 2001: treated with rituximab 2003: again treated with rituximab 11/25/2021: establish care with Dr. Federico. Transfer care from Dr. Layla  Interval History:  Francisco Larson 88 y.o. male with medical history significant for CLL who presents for a follow up visit. The patient's last visit was on 11/26/2022. In the interim since the last visit he   On exam today Francisco Larson reports he has been well overall in the room since our last visit.  He reports that he continues to recover well from his heart attack in June 2024.  He reports that he did have a fall in August 2024 but that he is doing much better since then.  He worked with physical therapy and cardiac rehab.  Subsequently has had no hospitalizations or ER visits.  He reports that he continues to do his cardio work and workout at the fitness center.  He reports his endurance is not as good as he would like but thinks it is a combination of his age and heart disease.  He is currently taking Eliquis  with no bleeding, or dark stools.  He reports that he does have some occasional bruising, particularly on his hands.  He denies any bumps or lumps concerning  for lymphadenopathy.  His weight has been steady.  Overall he feels well and has no questions concerns or complaints today.  A full 10 point ROS is otherwise negative.  MEDICAL HISTORY:  Past Medical History:  Diagnosis Date   Arthritis    Coronary artery disease    Hyperlipidemia    Leukemia, chronic lymphocytic (HCC)    SCCA (squamous cell carcinoma) of skin 08/08/2020   Mid Frontal Scalp (in situ) (curet and 5FU)   SCCA (squamous cell carcinoma) of skin 08/08/2020   Mid Parietal Scalp (in situ) (curet and 5FU)   SCCA (squamous cell carcinoma) of skin 08/08/2020   Mid Supratip of Nose (in situ)   Shingles    Squamous cell carcinoma of skin 12/08/2018   right jaw line cis   Vitamin D  deficiency     SURGICAL HISTORY: Past Surgical History:  Procedure Laterality Date   CARDIAC CATHETERIZATION     CARDIOVERSION N/A 12/16/2019   Procedure: CARDIOVERSION;  Surgeon: Francyne Headland, MD;  Location: [facility];  Service: Cardiovascular;  Laterality: N/A;   CORONARY STENT INTERVENTION N/A 06/27/2022   Procedure: CORONARY STENT INTERVENTION;  Surgeon: Anner Lenis ORN, MD;  Location: [facility] INVASIVE CV LAB;  Service: Cardiovascular;  Laterality: N/A;   EYE SURGERY     LAPAROSCOPIC TRANS ANAL AND TRANSABDOMINAL RECTAL RESECTION WITH COLOANAL ANASTOMOSIS     LEFT HEART CATH AND CORONARY ANGIOGRAPHY N/A 06/27/2022   Procedure: LEFT HEART CATH AND CORONARY ANGIOGRAPHY;  Surgeon: Anner Lenis ORN, MD;  Location: [facility] INVASIVE CV LAB;  Service: Cardiovascular;  Laterality: N/A;   SURGERY OF LIP  tumor   TONSILLECTOMY     VASECTOMY      SOCIAL HISTORY: Social History   Socioeconomic History   Marital status: Married    Spouse name: Lita   Number of children: 2   Years of education: 18   Highest education level: Doctorate  Occupational History   Occupation: Retired    Comment: Professor [employer]  Tobacco Use   Smoking status: Never   Smokeless tobacco: Never  Vaping Use   Vaping  status: Never Used  Substance and Sexual Activity   Alcohol use: No   Drug use: No   Sexual activity: Not on file  Other Topics Concern   Not on file  Social History Narrative   Lives with wife/2025   Social Drivers of Health   Financial Resource Strain: Low Risk  (08/14/2023)   Overall Financial Resource Strain (CARDIA)    Difficulty of Paying Living Expenses: Not hard at all  Food Insecurity: No Food Insecurity (08/14/2023)   Hunger Vital Sign    Worried About Running Out of Food in the Last Year: Never true    Ran Out of Food in the Last Year: Never true  Transportation Needs: No Transportation Needs (08/14/2023)   PRAPARE - Administrator, Civil Service (Medical): No    Lack of Transportation (Non-Medical): No  Physical Activity: Sufficiently Active (08/14/2023)   Exercise Vital Sign    Days of Exercise per Week: 6 days    Minutes of Exercise per Session: 60 min  Stress: Stress Concern Present (08/14/2023)   [employer] of Occupational Health - Occupational Stress Questionnaire    Feeling of Stress: To some extent  Social Connections: Socially Integrated (08/14/2023)   Social Connection and Isolation Panel    Frequency of Communication with Friends and Family: Twice a week    Frequency of Social Gatherings with Friends and Family: Twice a week    Attends Religious Services: More than 4 times per year    Active Member of [employer] or Organizations: Yes    Attends Engineer, Structural: More than 4 times per year    Marital Status: Married  Catering Manager Violence: Not At Risk (08/18/2023)   Humiliation, Afraid, Rape, and Kick questionnaire    Fear of Current or Ex-Partner: No    Emotionally Abused: No    Physically Abused: No    Sexually Abused: No    FAMILY HISTORY: Family History  Problem Relation Age of Onset   Colon cancer Sister    Hyperlipidemia Sister    Stroke Sister    Heart disease Father    Emphysema Mother        never smoker, but exposed  to 2nd hand from spouse   Cancer Mother    COPD Maternal Grandmother        never smoker   Stroke Maternal Grandfather    Diabetes Paternal Grandmother    Mental illness Paternal Grandmother    Asthma Sister     ALLERGIES:  is allergic to azithromycin.  MEDICATIONS:  Current Outpatient Medications  Medication Sig Dispense Refill   amLODipine  (NORVASC ) 5 MG tablet Take 1 tablet (5 mg total) by mouth daily. 90 tablet 3   apixaban  (ELIQUIS ) 5 MG TABS tablet Take 1 tablet (5 mg total) by mouth 2 (two) times daily. 180 tablet 3   apixaban  (ELIQUIS ) 5 MG TABS tablet Take 1 tablet (5 mg total) by mouth 2 (two) times daily. 56 tablet 0   atorvastatin  (LIPITOR ) 80  MG tablet Take 1 tablet (80 mg total) by mouth daily. 90 tablet 2   betamethasone, augmented, (DIPROLENE) 0.05 % lotion Apply topically.     clopidogrel  (PLAVIX ) 75 MG tablet Take 1 tablet (75 mg total) by mouth daily. 90 tablet 3   Multiple Vitamins-Minerals (CENTRUM SILVER PO) Take 1 tablet by mouth daily with breakfast.     nitroGLYCERIN  (NITROSTAT ) 0.4 MG SL tablet Place 1 tablet (0.4 mg total) under the tongue every 5 (five) minutes as needed for chest pain. 25 tablet 3   TYLENOL  325 MG tablet Take 325-650 mg by mouth every 6 (six) hours as needed for mild pain or headache.     No current facility-administered medications for this visit.    REVIEW OF SYSTEMS:   Constitutional: ( - ) fevers, ( - )  chills , ( - ) night sweats Eyes: ( - ) blurriness of vision, ( - ) double vision, ( - ) watery eyes Ears, nose, mouth, throat, and face: ( - ) mucositis, ( - ) sore throat Respiratory: ( - ) cough, ( - ) dyspnea, ( - ) wheezes Cardiovascular: ( - ) palpitation, ( - ) chest discomfort, ( - ) lower extremity swelling Gastrointestinal:  ( - ) nausea, ( - ) heartburn, ( - ) change in bowel habits Skin: ( - ) abnormal skin rashes Lymphatics: ( - ) new lymphadenopathy, ( - ) easy bruising Neurological: ( - ) numbness, ( - ) tingling, (  - ) new weaknesses Behavioral/Psych: ( - ) mood change, ( - ) new changes  All other systems were reviewed with the patient and are negative.  PHYSICAL EXAMINATION: ECOG PERFORMANCE STATUS: 0 - Asymptomatic  Vitals:   11/25/23 0847  BP: 129/71  Pulse: 64  Resp: 13  Temp: 97.8 F (36.6 C)  SpO2: 100%     Filed Weights   11/25/23 0847  Weight: 204 lb 1.6 oz (92.6 kg)      GENERAL: Well-appearing elderly Caucasian male, appears younger than stated age.  Alert, no distress and comfortable SKIN: skin color, texture, turgor are normal, no rashes or significant lesions EYES: conjunctiva are pink and non-injected, sclera clear NECK: supple, non-tender LYMPH:  no palpable lymphadenopathy in the cervical, axillary or inguinal LUNGS: clear to auscultation and percussion with normal breathing effort HEART: regular rate & rhythm and no murmurs and no lower extremity edema Musculoskeletal: no cyanosis of digits and no clubbing  PSYCH: alert & oriented x 3, fluent speech NEURO: no focal motor/sensory deficits  LABORATORY DATA:  I have reviewed the data as listed    Latest Ref Rng & Units 11/25/2023    8:13 AM 04/22/2023   11:14 AM 11/26/2022    8:39 AM  CBC  WBC 4.0 - 10.5 K/uL 13.4  20.5 Repeated and verified X2.  20.6   Hemoglobin 13.0 - 17.0 g/dL 84.3  83.5  84.0   Hematocrit 39.0 - 52.0 % 46.9  49.2  47.1   Platelets 150 - 400 K/uL 150  160.0  164        Latest Ref Rng & Units 11/25/2023    8:13 AM 04/22/2023   11:14 AM 11/26/2022    8:39 AM  CMP  Glucose 70 - 99 mg/dL 857  893  839   BUN 8 - 23 mg/dL 12  17  16    Creatinine 0.61 - 1.24 mg/dL 8.93  8.84  8.91   Sodium 135 - 145 mmol/L 138  136  139  Potassium 3.5 - 5.1 mmol/L 4.6  4.4  4.3   Chloride 98 - 111 mmol/L 105  102  105   CO2 22 - 32 mmol/L 30  29  31    Calcium  8.9 - 10.3 mg/dL 9.0  9.1  9.1   Total Protein 6.5 - 8.1 g/dL 6.2   6.2   Total Bilirubin 0.0 - 1.2 mg/dL 0.7   0.7   Alkaline Phos 38 - 126 U/L  85   92   AST 15 - 41 U/L 23   24   ALT 0 - 44 U/L 18   24     No results found for: MPROTEIN Lab Results  Component Value Date   KPAFRELGTCHN 0.96 10/30/2009   KPAFRELGTCHN 0.88 02/27/2009   LAMBDASER 1.16 10/30/2009   LAMBDASER 0.88 02/27/2009   KAPLAMBRATIO 0.83 10/30/2009   KAPLAMBRATIO 1.00 02/27/2009     RADIOGRAPHIC STUDIES: CUP PACEART REMOTE DEVICE CHECK Result Date: 11/01/2023 ILR summary report received. Battery status OK. Normal device function. No new symptom, tachy, brady, or pause episodes. 16 new AF episodes, AF burden 1.2%.  Eliquis  per EPIC EMR. Longest episode 40 min.  Monthly summary reports and ROV/PRN ML, CVRS   ASSESSMENT & PLAN Francisco Larson 88 y.o. male with medical history significant for CLL who presents for a follow up visit.  # CLL Rai Stage 0 --labs today show white blood cell 13.4, Hgb 15.6, MCV 89.7, Plt 150  --Patient has twice been treated with rituximab, once in 2001 and again in 2003 --No clear indications for treatment at this time.  Indications for treatment as noted below. --RTC in 12 month time or sooner if he were to develop new or worsening symptoms.  No orders of the defined types were placed in this encounter.   All questions were answered. The patient knows to call the clinic with any problems, questions or concerns.  A total of more than 30 minutes were spent on this encounter with face-to-face time and non-face-to-face time, including preparing to see the patient, ordering tests and/or medications, counseling the patient and coordination of care as outlined above.   Norleen IVAR Kidney, MD Department of Hematology/Oncology [facility] at [facility] Phone: [phone] Pager: [phone] Email: [email or web address]  11/25/2023 10:48 AM  Shermon CHRISTELLA Hearing BD, Catovsky D, Caligaris-Cappio F, Dighiero G, Dhner H, Hillmen P, Keating M, Montserrat E, Chiorazzi N, Stilgenbauer S, Rai KR, Iredell,  Eichhorst B, O'Brien S, Robak T, Seymour JF, Kipps TJ. iwCLL guidelines for diagnosis, indications for treatment, response assessment, and supportive management of CLL. Blood. 2018 Jun 21;131(25):2745-2760.  Active disease should be clearly documented to initiate therapy. At least 1 of the following criteria should be met.  1) Evidence of progressive marrow failure as manifested by the development of, or worsening of, anemia and/or thrombocytopenia. Cutoff levels of Hb <10 g/dL or platelet counts <899  109/L are generally regarded as indication for treatment. However, in some patients, platelet counts <100  109/L may remain stable over a long period; this situation does not automatically require therapeutic intervention. 2) Massive (ie, >=6 cm below the left costal margin) or progressive or symptomatic splenomegaly. 3) Massive nodes (ie, >=10 cm in longest diameter) or progressive or symptomatic lymphadenopathy. 4) Progressive lymphocytosis with an increase of >=50% over a 11-month period, or lymphocyte doubling time (LDT) <6 months. LDT can be obtained by linear regression extrapolation of absolute lymphocyte counts obtained at intervals of 2 weeks over an observation period  of 2 to 3 months; patients with initial blood lymphocyte counts <30  109/L may require a longer observation period to determine the LDT. Factors contributing to lymphocytosis other than CLL (eg, infections, steroid administration) should be excluded. 5) Autoimmune complications including anemia or thrombocytopenia poorly responsive to corticosteroids. 6) Symptomatic or functional extranodal involvement (eg, skin, kidney, lung, spine). Disease-related symptoms as defined by any of the following: Unintentional weight loss >=10% within the previous 6 months. Significant fatigue (ie, ECOG performance scale 2 or worse; cannot work or unable to perform usual activities). Fevers >=100.30F or 38.0C for 2 or more weeks without evidence  of infection. Night sweats for >=1 month without evidence of infection.

## 2023-11-30 ENCOUNTER — Encounter

## 2023-11-30 LAB — CUP PACEART REMOTE DEVICE CHECK
Date Time Interrogation Session: 20251116230757
Implantable Pulse Generator Implant Date: 20211221

## 2023-12-02 ENCOUNTER — Ambulatory Visit: Attending: Cardiology

## 2023-12-02 ENCOUNTER — Ambulatory Visit: Payer: Self-pay | Admitting: Cardiology

## 2023-12-02 DIAGNOSIS — I480 Paroxysmal atrial fibrillation: Secondary | ICD-10-CM | POA: Diagnosis not present

## 2023-12-02 NOTE — Progress Notes (Signed)
 Remote Loop Recorder Transmission

## 2023-12-23 ENCOUNTER — Encounter: Payer: Self-pay | Admitting: Podiatry

## 2023-12-23 ENCOUNTER — Ambulatory Visit: Admitting: Podiatry

## 2023-12-23 DIAGNOSIS — B351 Tinea unguium: Secondary | ICD-10-CM | POA: Diagnosis not present

## 2023-12-23 DIAGNOSIS — M79674 Pain in right toe(s): Secondary | ICD-10-CM | POA: Diagnosis not present

## 2023-12-23 DIAGNOSIS — M79675 Pain in left toe(s): Secondary | ICD-10-CM | POA: Diagnosis not present

## 2023-12-31 ENCOUNTER — Encounter

## 2023-12-31 NOTE — Progress Notes (Unsigned)
°  Subjective:  Patient ID: Charlie Gussie Canny, male    DOB: September 05, 1935,  MRN: [id number]  Charlie Gussie Canny presents to clinic today for {jgcomplaint:23593} No chief complaint on file.  New problem(s): None. {jgcomplaint:23593}  PCP is Joshua Debby CROME, MD.  Allergies[1]  Review of Systems: Negative except as noted in the HPI.  Objective: No changes noted in today's physical examination. There were no vitals filed for this visit. Brayln Maitland Lesiak is a pleasant 88 y.o. male {jgbodyhabitus:24098} AAO x 3.  Assessment/Plan: No diagnosis found.  No orders of the defined types were placed in this encounter.   None {Jgplan:23602::-Patient/POA to call should there be question/concern in the interim.}   No follow-ups on file.  Delon CROME Merlin, DPM      [place] LOCATION: [address], [place] [address]                   Office [phone]   [place] LOCATION: [address] [place], [place] [address] Office [phone]     [1]  Allergies Allergen Reactions   Azithromycin Itching and Other (See Comments)    Pt stated he thinks this made him dizzy, also

## 2024-01-01 ENCOUNTER — Encounter: Payer: Self-pay | Admitting: Podiatry

## 2024-01-02 ENCOUNTER — Ambulatory Visit

## 2024-01-02 DIAGNOSIS — I480 Paroxysmal atrial fibrillation: Secondary | ICD-10-CM | POA: Diagnosis not present

## 2024-01-04 LAB — CUP PACEART REMOTE DEVICE CHECK
Date Time Interrogation Session: 20251219231115
Implantable Pulse Generator Implant Date: 20211221

## 2024-01-04 NOTE — Progress Notes (Signed)
 Remote Loop Recorder Transmission

## 2024-01-12 ENCOUNTER — Ambulatory Visit: Payer: Self-pay | Admitting: Cardiology

## 2024-01-31 ENCOUNTER — Encounter

## 2024-02-02 ENCOUNTER — Ambulatory Visit

## 2024-02-02 DIAGNOSIS — I480 Paroxysmal atrial fibrillation: Secondary | ICD-10-CM | POA: Diagnosis not present

## 2024-02-03 LAB — CUP PACEART REMOTE DEVICE CHECK
Date Time Interrogation Session: 20260119230519
Implantable Pulse Generator Implant Date: 20211221

## 2024-02-04 ENCOUNTER — Ambulatory Visit: Payer: Self-pay | Admitting: Cardiology

## 2024-02-05 NOTE — Progress Notes (Signed)
 Remote Loop Recorder Transmission

## 2024-03-02 ENCOUNTER — Encounter

## 2024-03-04 ENCOUNTER — Ambulatory Visit

## 2024-03-23 ENCOUNTER — Ambulatory Visit: Admitting: Podiatry

## 2024-04-04 ENCOUNTER — Ambulatory Visit

## 2024-04-28 ENCOUNTER — Encounter: Admitting: Internal Medicine

## 2024-05-05 ENCOUNTER — Ambulatory Visit

## 2024-06-01 ENCOUNTER — Encounter (HOSPITAL_COMMUNITY)

## 2024-06-01 ENCOUNTER — Other Ambulatory Visit (HOSPITAL_COMMUNITY)

## 2024-06-05 ENCOUNTER — Ambulatory Visit

## 2024-06-27 ENCOUNTER — Ambulatory Visit: Admitting: Cardiovascular Disease

## 2024-07-06 ENCOUNTER — Ambulatory Visit

## 2024-08-06 ENCOUNTER — Ambulatory Visit

## 2024-08-18 ENCOUNTER — Ambulatory Visit

## 2024-09-06 ENCOUNTER — Ambulatory Visit

## 2024-10-07 ENCOUNTER — Ambulatory Visit

## 2024-11-07 ENCOUNTER — Ambulatory Visit

## 2024-11-23 ENCOUNTER — Inpatient Hospital Stay: Admitting: Hematology and Oncology

## 2024-11-23 ENCOUNTER — Inpatient Hospital Stay

## 2024-12-08 ENCOUNTER — Ambulatory Visit

## 2025-01-08 ENCOUNTER — Ambulatory Visit

## 2025-02-08 ENCOUNTER — Ambulatory Visit
